# Patient Record
Sex: Female | Born: 1995 | Race: White | Hispanic: No | Smoking: Never smoker
Health system: Southern US, Community
[De-identification: ages and names within clinical notes are randomized; demographics above are authoritative.]

## PROBLEM LIST (undated history)

## (undated) ENCOUNTER — Inpatient Hospital Stay: Payer: Medicaid (Managed Care)

## (undated) ENCOUNTER — Encounter

## (undated) ENCOUNTER — Encounter: Attending: Maternal & Fetal Medicine | Primary: Maternal & Fetal Medicine

## (undated) ENCOUNTER — Other Ambulatory Visit

## (undated) ENCOUNTER — Telehealth

## (undated) ENCOUNTER — Encounter: Attending: Family | Primary: Family

## (undated) ENCOUNTER — Ambulatory Visit: Payer: MEDICAID

## (undated) ENCOUNTER — Encounter: Attending: Medical | Primary: Medical

## (undated) ENCOUNTER — Ambulatory Visit: Payer: Medicaid (Managed Care)

## (undated) ENCOUNTER — Telehealth: Attending: Clinical | Primary: Clinical

## (undated) ENCOUNTER — Ambulatory Visit
Payer: MEDICARE | Attending: Student in an Organized Health Care Education/Training Program | Primary: Student in an Organized Health Care Education/Training Program

## (undated) ENCOUNTER — Encounter: Attending: Advanced Practice Midwife | Primary: Advanced Practice Midwife

## (undated) ENCOUNTER — Ambulatory Visit: Payer: MEDICAID | Attending: Physician Assistant | Primary: Physician Assistant

## (undated) ENCOUNTER — Ambulatory Visit: Payer: MEDICAID | Attending: Advanced Practice Midwife | Primary: Advanced Practice Midwife

## (undated) ENCOUNTER — Ambulatory Visit

## (undated) ENCOUNTER — Telehealth
Attending: Student in an Organized Health Care Education/Training Program | Primary: Student in an Organized Health Care Education/Training Program

## (undated) ENCOUNTER — Ambulatory Visit
Payer: MEDICAID | Attending: Rehabilitative and Restorative Service Providers" | Primary: Rehabilitative and Restorative Service Providers"

## (undated) ENCOUNTER — Encounter: Attending: Physician Assistant | Primary: Physician Assistant

## (undated) ENCOUNTER — Encounter
Attending: Student in an Organized Health Care Education/Training Program | Primary: Student in an Organized Health Care Education/Training Program

## (undated) ENCOUNTER — Ambulatory Visit: Payer: Medicaid (Managed Care) | Attending: Family | Primary: Family

## (undated) ENCOUNTER — Telehealth: Attending: Medical | Primary: Medical

## (undated) ENCOUNTER — Ambulatory Visit: Payer: Medicaid (Managed Care) | Attending: Maternal & Fetal Medicine | Primary: Maternal & Fetal Medicine

## (undated) ENCOUNTER — Telehealth: Attending: Advanced Practice Midwife | Primary: Advanced Practice Midwife

## (undated) ENCOUNTER — Inpatient Hospital Stay: Payer: MEDICARE

## (undated) ENCOUNTER — Telehealth: Attending: Pharmacist | Primary: Pharmacist

## (undated) ENCOUNTER — Ambulatory Visit: Payer: MEDICARE

## (undated) ENCOUNTER — Encounter: Attending: Obstetrics & Gynecology | Primary: Obstetrics & Gynecology

## (undated) ENCOUNTER — Encounter
Attending: Rehabilitative and Restorative Service Providers" | Primary: Rehabilitative and Restorative Service Providers"

## (undated) ENCOUNTER — Inpatient Hospital Stay: Payer: MEDICAID

## (undated) ENCOUNTER — Ambulatory Visit: Payer: Medicaid (Managed Care) | Attending: Physician Assistant | Primary: Physician Assistant

## (undated) ENCOUNTER — Ambulatory Visit
Payer: MEDICAID | Attending: Student in an Organized Health Care Education/Training Program | Primary: Student in an Organized Health Care Education/Training Program

## (undated) ENCOUNTER — Telehealth: Attending: Maternal & Fetal Medicine | Primary: Maternal & Fetal Medicine

## (undated) ENCOUNTER — Ambulatory Visit: Payer: PRIVATE HEALTH INSURANCE | Attending: Registered Nurse | Primary: Registered Nurse

## (undated) ENCOUNTER — Telehealth: Attending: Family | Primary: Family

## (undated) ENCOUNTER — Ambulatory Visit: Payer: MEDICAID | Attending: Registered Nurse | Primary: Registered Nurse

## (undated) ENCOUNTER — Telehealth: Attending: Counselor | Primary: Counselor

## (undated) ENCOUNTER — Telehealth: Attending: Obstetrics & Gynecology | Primary: Obstetrics & Gynecology

## (undated) ENCOUNTER — Encounter: Attending: Registered Nurse | Primary: Registered Nurse

## (undated) ENCOUNTER — Ambulatory Visit: Payer: MEDICAID | Attending: Registered" | Primary: Registered"

## (undated) ENCOUNTER — Encounter: Attending: Registered" | Primary: Registered"

## (undated) ENCOUNTER — Ambulatory Visit: Attending: Dermatology | Primary: Dermatology

## (undated) ENCOUNTER — Ambulatory Visit: Payer: MEDICAID | Attending: Clinical | Primary: Clinical

## (undated) ENCOUNTER — Ambulatory Visit: Payer: PRIVATE HEALTH INSURANCE

## (undated) ENCOUNTER — Ambulatory Visit: Payer: Medicaid (Managed Care) | Attending: Registered Nurse | Primary: Registered Nurse

## (undated) ENCOUNTER — Ambulatory Visit: Payer: MEDICAID | Attending: Obstetrics & Gynecology | Primary: Obstetrics & Gynecology

## (undated) VITALS — BP 134/84 | HR 86 | Temp 98.5°F | Resp 20 | Ht 63.86 in | Wt 336.2 lb

## (undated) DIAGNOSIS — F419 Anxiety disorder, unspecified: Secondary | ICD-10-CM

## (undated) DIAGNOSIS — K219 Gastro-esophageal reflux disease without esophagitis: Secondary | ICD-10-CM

## (undated) DIAGNOSIS — E669 Obesity, unspecified: Secondary | ICD-10-CM

## (undated) DIAGNOSIS — J309 Allergic rhinitis, unspecified: Secondary | ICD-10-CM

## (undated) DIAGNOSIS — F329 Major depressive disorder, single episode, unspecified: Secondary | ICD-10-CM

## (undated) DIAGNOSIS — K589 Irritable bowel syndrome without diarrhea: Secondary | ICD-10-CM

## (undated) DIAGNOSIS — I10 Essential (primary) hypertension: Secondary | ICD-10-CM

## (undated) DIAGNOSIS — F32A Depression, unspecified: Secondary | ICD-10-CM

## (undated) DIAGNOSIS — Z9989 Dependence on other enabling machines and devices: Secondary | ICD-10-CM

## (undated) DIAGNOSIS — G4733 Obstructive sleep apnea (adult) (pediatric): Secondary | ICD-10-CM

## (undated) DIAGNOSIS — L409 Psoriasis, unspecified: Secondary | ICD-10-CM

## (undated) HISTORY — DX: Major depressive disorder, single episode, unspecified: F32.9

## (undated) HISTORY — DX: Depression, unspecified: F32.A

## (undated) HISTORY — PX: SINOSCOPY: SHX187

## (undated) HISTORY — DX: Anxiety disorder, unspecified: F41.9

## (undated) HISTORY — DX: Irritable bowel syndrome, unspecified: K58.9

## (undated) HISTORY — PX: TONSILLECTOMY AND ADENOIDECTOMY: SUR1326

## (undated) HISTORY — DX: Gastro-esophageal reflux disease without esophagitis: K21.9

## (undated) HISTORY — PX: ADENOIDECTOMY: SUR15

## (undated) HISTORY — DX: Allergic rhinitis, unspecified: J30.9

## (undated) HISTORY — DX: Psoriasis, unspecified: L40.9

## (undated) MED ORDER — FERROUS SULFATE 325 MG (65 MG IRON) TABLET: Freq: Every day | ORAL | 0 days | Status: SS

## (undated) MED ORDER — PROMETHAZINE 12.5 MG TABLET: Freq: Four times a day (QID) | ORAL | 0.00000 days | PRN

## (undated) MED ORDER — ASPIRIN 81 MG CAPSULE: Freq: Every day | ORAL | 0.00000 days

## (undated) MED ORDER — LEVOTHYROXINE 50 MCG TABLET: Freq: Every day | ORAL | 0 days

## (undated) MED ORDER — FLUOCINOLONE 0.01 % SCALP OIL AND SHOWER CAP: Freq: Every day | TOPICAL | 0.00000 days

## (undated) MED ORDER — ACETAMINOPHEN 325 MG TABLET: ORAL | 0.00000 days

## (undated) MED ORDER — PULMICORT FLEXHALER INHL: RESPIRATORY_TRACT | 0.00000 days

## (undated) MED ORDER — ALBUTEROL SULFATE HFA 90 MCG/ACTUATION AEROSOL INHALER: Freq: Four times a day (QID) | RESPIRATORY_TRACT | 0.00000 days | PRN

## (undated) MED ORDER — PRENATAL VITAMIN WITH CALCIUM NO.72-IRON 27 MG-FOLIC ACID 1 MG TABLET: Freq: Every day | ORAL | 0.00000 days

## (undated) MED ORDER — DOCUSATE SODIUM 100 MG CAPSULE: Freq: Every day | ORAL | 0 days

## (undated) MED ORDER — HYDRALAZINE 10 MG TABLET: Freq: Four times a day (QID) | ORAL | 0 days

## (undated) MED ORDER — FLUTICASONE PROPIONATE 50 MCG/ACTUATION NASAL SPRAY,SUSPENSION: Freq: Every day | NASAL | 0 days | Status: SS

## (undated) MED ORDER — GUAIFENESIN 400 MG TABLET
ORAL | 0 days | PRN
Start: ? — End: 2020-05-26

## (undated) MED ORDER — ONDANSETRON HCL 4 MG TABLET: Freq: Three times a day (TID) | ORAL | 0.00000 days | PRN

## (undated) MED ORDER — CIMZIA SUBQ: SUBCUTANEOUS | 0 days | Status: SS

## (undated) MED ORDER — ALBUTEROL SULFATE 2.5 MG/3 ML (0.083 %) SOLUTION FOR NEBULIZATION: Freq: Four times a day (QID) | RESPIRATORY_TRACT | 0.00000 days | Status: SS | PRN

## (undated) MED ORDER — SENNOSIDES 8.6 MG TABLET: Freq: Every day | ORAL | 0 days

---
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
## 2015-01-09 ENCOUNTER — Ambulatory Visit: Payer: Medicaid Other | Admitting: Allergy and Immunology

## 2015-01-24 ENCOUNTER — Other Ambulatory Visit: Payer: Self-pay | Admitting: *Deleted

## 2015-01-24 MED ORDER — FEXOFENADINE HCL 180 MG PO TABS: 180.0000 mg | ORAL_TABLET | Freq: Every day | ORAL | Status: DC

## 2015-02-19 ENCOUNTER — Ambulatory Visit: Payer: Medicaid Other | Admitting: Allergy and Immunology

## 2015-03-03 ENCOUNTER — Ambulatory Visit: Payer: Medicaid Other | Admitting: Allergy and Immunology

## 2015-03-14 DIAGNOSIS — F603 Borderline personality disorder: Secondary | ICD-10-CM | POA: Insufficient documentation

## 2015-03-14 HISTORY — DX: Borderline personality disorder: F60.3

## 2015-04-03 HISTORY — DX: Morbid (severe) obesity due to excess calories: E66.01

## 2015-04-14 ENCOUNTER — Ambulatory Visit: Payer: Medicaid Other | Admitting: Allergy and Immunology

## 2015-04-21 ENCOUNTER — Ambulatory Visit: Payer: Medicaid Other | Admitting: Allergy and Immunology

## 2015-04-28 ENCOUNTER — Ambulatory Visit: Payer: Medicaid Other | Admitting: Allergy and Immunology

## 2015-05-01 ENCOUNTER — Other Ambulatory Visit: Payer: Self-pay | Admitting: *Deleted

## 2015-05-01 MED ORDER — FEXOFENADINE HCL 180 MG PO TABS: 180.0000 mg | ORAL_TABLET | Freq: Every day | ORAL | Status: DC

## 2015-05-08 ENCOUNTER — Ambulatory Visit: Payer: Medicaid Other | Admitting: Allergy and Immunology

## 2015-05-19 ENCOUNTER — Ambulatory Visit (INDEPENDENT_AMBULATORY_CARE_PROVIDER_SITE_OTHER): Payer: Medicaid Other | Admitting: Allergy and Immunology

## 2015-05-19 ENCOUNTER — Encounter: Payer: Self-pay | Admitting: Allergy and Immunology

## 2015-05-19 VITALS — BP 124/80 | HR 80 | Resp 16

## 2015-05-19 DIAGNOSIS — K219 Gastro-esophageal reflux disease without esophagitis: Secondary | ICD-10-CM | POA: Diagnosis not present

## 2015-05-19 DIAGNOSIS — J4520 Mild intermittent asthma, uncomplicated: Secondary | ICD-10-CM | POA: Diagnosis not present

## 2015-05-19 DIAGNOSIS — L989 Disorder of the skin and subcutaneous tissue, unspecified: Secondary | ICD-10-CM

## 2015-05-19 DIAGNOSIS — H1010 Acute atopic conjunctivitis, unspecified eye: Secondary | ICD-10-CM

## 2015-05-19 DIAGNOSIS — J309 Allergic rhinitis, unspecified: Secondary | ICD-10-CM

## 2015-05-19 DIAGNOSIS — L308 Other specified dermatitis: Secondary | ICD-10-CM | POA: Diagnosis not present

## 2015-05-19 MED ORDER — AZELASTINE HCL 0.1 % NA SOLN: NASAL | Status: DC

## 2015-05-19 NOTE — Patient Instructions (Addendum)
  1. Visit with dermatologist concerning dermatitis on trunk  2. Continue Prilosec 40 mg twice a day  3. Continue Nasonex one spray each nostril twice a day  4. Continue Allegra 180 one tablet one time per day  5. Restart immunotherapy and EpiPen  6. If needed:   A. nasal saline spray multiple times a day  B. Azelastine 1-2 sprays each nostril one-2 times a day  C. Proventil HFA 2 puffs every 4-6 hours  7. Return to clinic in 3 months or earlier if there is a problem

## 2015-05-19 NOTE — Progress Notes (Signed)
Follow-up Note  Referring Provider: Cresenciano Lick, MD Primary Provider: Cresenciano Lick, MD Date of Office Visit: 05/19/2015  Subjective:   Misty [name] (DOB: 1995/05/09) is a 20 y.o. female [facility] in re-evaluation of the following:  HPI Comments: Misty [name] returns to this clinic in reevaluation of her allergic rhinitis and reflux and distant history of asthma and intermittent urticaria. She is developed problems with stuffed up in the morning and some nasal congestion even in the face of using her Nasonex twice a day. She's been without immunotherapy now for greater than 6 months because of logistical issue. She would like to restart this form of therapy.  Her reflux is under good control as long as she consistently use her medical treatment which at this point time includes Prilosec 40 mg twice a day.  She's not had any significant issues with asthma requiring her to use a short acting bronchodilator. She's not been having any problems with hives.  She has been developing a dermatitis on her trunk. 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 She did visit with her primary care physician this morning who gave her pills.     Medication List           busPIRone 10 MG tablet  Commonly known as:  BUSPAR  Take 20 mg by mouth 3 (three) times daily.     CRYSELLE-28 PO  Take by mouth.     dicyclomine 10 MG capsule  Commonly known as:  BENTYL  Take 10 mg by mouth 4 (four) times daily.     fexofenadine 180 MG tablet  Commonly known as:  ALLEGRA  Take 180 mg by mouth daily.     FIBER PO  Take by mouth daily.     FLUoxetine 20 MG capsule  Commonly known as:  PROZAC  Take 20 mg by mouth every morning.     hydrOXYzine  25 MG tablet  Commonly known as:  ATARAX/VISTARIL  Take 25 mg by mouth 4 (four) times daily.     lisinopril 10 MG tablet  Commonly known as:  PRINIVIL,ZESTRIL  Take 10 mg by mouth every morning.     MELATONIN PO  Take 6 mg by mouth at bedtime.     metoprolol tartrate 25 MG tablet  Commonly known as:  LOPRESSOR  Take 25 mg by mouth 2 (two) times daily.     MULTIVITAMIN ADULT PO  Take by mouth.     NASONEX 50 MCG/ACT nasal spray  Generic drug:  mometasone  Place 1 spray into the nose 2 (two) times daily.     omeprazole 40 MG capsule  Commonly known as:  PRILOSEC  Take 40 mg by mouth 2 (two) times daily.     Oxcarbazepine 300 MG tablet  Commonly known as:  TRILEPTAL  Take 300 mg by mouth 2 (two) times daily.     paliperidone 6 MG 24 hr tablet  Commonly known as:  INVEGA  Take 6 mg by mouth every morning.     paliperidone 9 MG 24 hr tablet  Commonly known as:  INVEGA  Take 9 mg by mouth at bedtime.     PAZEO 0.7 % Soln  Generic drug:  Olopatadine HCl  Apply 1 drop to eye daily  as needed.     prazosin 1 MG capsule  Commonly known as:  MINIPRESS  Take 2 mg by mouth at bedtime.     WATER PILLS PO  Take by mouth 3 (three) times daily.        Past Medical History  Diagnosis Date  . Obesity   . OSA on CPAP   . HTN (hypertension) 07/13/12  . Depression   . Anxiety   . IBS (irritable bowel syndrome)   . Psoriasis   . Allergic rhinitis   . GERD (gastroesophageal reflux disease)     Past Surgical History  Procedure Laterality Date  . Tonsillectomy and adenoidectomy  Age 51    Deviated septum corrected at same time  . Adenoidectomy    . Sinoscopy      Allergies  Allergen Reactions  . Ultram [Tramadol] Rash    Pt reports itching as well  . Adhesive [Tape] Rash    Review of systems negative except as noted in HPI / PMHx or noted below:  Review of Systems  Constitutional: Negative.   HENT: Negative.   Eyes: Negative.   Respiratory: Negative.     Cardiovascular: Negative.   Gastrointestinal: Negative.   Genitourinary: Negative.   Musculoskeletal: Negative.   Skin: Negative.   Neurological: Negative.   Endo/Heme/Allergies: Negative.   Psychiatric/Behavioral: Negative.      Objective:   Filed Vitals:   05/19/15 1646  BP: 124/80  Pulse: 80  Resp: 16          Physical Exam  Constitutional: She is well-developed, well-nourished, and in no distress.  HENT:  Head: Normocephalic.  Right Ear: Tympanic membrane, external ear and ear canal normal.  Left Ear: Tympanic membrane, external ear and ear canal normal.  Nose: Nose normal. No mucosal edema or rhinorrhea.  Mouth/Throat: Uvula is midline, oropharynx is clear and moist and mucous membranes are normal. No oropharyngeal exudate.  Eyes: Conjunctivae are normal.  Neck: Trachea normal. No tracheal tenderness present. No tracheal deviation present. No thyromegaly present.  Cardiovascular: Normal rate, regular rhythm, S1 normal, S2 normal and normal heart sounds.   No murmur heard. Pulmonary/Chest: Breath sounds normal. No stridor. No respiratory distress. She has no wheezes. She has no rales.  Musculoskeletal: She exhibits no edema.  Lymphadenopathy:       Head (right side): No tonsillar adenopathy present.       Head (left side): No tonsillar adenopathy present.    She has no cervical adenopathy.  Neurological: She is alert. Gait normal.  Skin: Rash (Scaly erythematous indurated dermatitis involving postauricular and scalp area. Very erythematous slightly indurated dermatitis involving infra-breast area and spotty location on abdomen without any scale.) noted. She is not diaphoretic. No erythema. Nails show no clubbing.  Psychiatric: Mood and affect normal.    Diagnostics: None    Assessment and Plan:   1. Allergic rhinoconjunctivitis   2. Asthma, mild intermittent, well-controlled   3. Gastroesophageal reflux disease, esophagitis presence not specified   4.  Inflammatory dermatosis     1. Visit with dermatologist concerning dermatitis on trunk  2. Continue Prilosec 20 mg twice a day  3. Continue Nasonex one spray each nostril twice a day  4. Continue Allegra 180 one tablet one time per day  5. Restart immunotherapy and EpiPen  6. If needed:   A. nasal saline spray multiple times a day  B. Azelastine 1-2 sprays each nostril one-2 times a day  C. Proventil HFA 2 puffs every 4-6 hours  7. Return to clinic in 3 months or earlier if there is a problem  Misty [name] would do better if she consistently used her immunotherapy and she will try to restart this form of therapy in the near future. She will continue to use some anti-inflammatory medications for her upper respiratory tract as stated above and has the option of using various other medications to control symptoms. Her dermatitis is somewhat worrisome. I suspect it is fungal in nature but this is a rather significant fungal infection it would  be best handled by her dermatologist. She already has a close relationship with her dermatologist to address her psoriatic dermatitis which sounds as though it will be treated with Enbrel in the near future. She had some blood tests drawn recently in anticipation of starting a treatment. I will see her back in this clinic in 3 months or earlier if there is a problem.  Laurette SchimkeEric Dezerae Freiberger, MD [facility]

## 2015-05-21 ENCOUNTER — Telehealth: Payer: Self-pay | Admitting: Allergy and Immunology

## 2015-05-21 ENCOUNTER — Other Ambulatory Visit: Payer: Self-pay

## 2015-05-21 MED ORDER — ALBUTEROL SULFATE HFA 108 (90 BASE) MCG/ACT IN AERS: 2.0000 | INHALATION_SPRAY | RESPIRATORY_TRACT | Status: DC | PRN

## 2015-05-21 NOTE — Telephone Encounter (Signed)
proventil was not called into pharmacy

## 2015-05-22 DIAGNOSIS — J3089 Other allergic rhinitis: Secondary | ICD-10-CM | POA: Diagnosis not present

## 2015-05-29 ENCOUNTER — Other Ambulatory Visit: Payer: Self-pay | Admitting: Allergy and Immunology

## 2015-06-12 ENCOUNTER — Ambulatory Visit: Payer: Medicaid Other

## 2015-06-16 ENCOUNTER — Ambulatory Visit: Payer: Medicaid Other

## 2015-06-30 ENCOUNTER — Ambulatory Visit: Payer: Medicaid Other

## 2015-06-30 ENCOUNTER — Ambulatory Visit (INDEPENDENT_AMBULATORY_CARE_PROVIDER_SITE_OTHER): Payer: Medicaid Other

## 2015-06-30 DIAGNOSIS — J309 Allergic rhinitis, unspecified: Secondary | ICD-10-CM | POA: Diagnosis not present

## 2015-07-10 ENCOUNTER — Ambulatory Visit (INDEPENDENT_AMBULATORY_CARE_PROVIDER_SITE_OTHER): Payer: Medicaid Other

## 2015-07-10 DIAGNOSIS — J309 Allergic rhinitis, unspecified: Secondary | ICD-10-CM | POA: Diagnosis not present

## 2015-07-21 DIAGNOSIS — R002 Palpitations: Secondary | ICD-10-CM

## 2015-07-21 HISTORY — DX: Palpitations: R00.2

## 2015-08-07 ENCOUNTER — Ambulatory Visit: Payer: Medicaid Other | Admitting: Allergy and Immunology

## 2015-08-18 ENCOUNTER — Encounter: Payer: Self-pay | Admitting: Allergy and Immunology

## 2015-08-18 ENCOUNTER — Ambulatory Visit: Payer: Self-pay

## 2015-08-18 ENCOUNTER — Ambulatory Visit (INDEPENDENT_AMBULATORY_CARE_PROVIDER_SITE_OTHER): Payer: Medicaid Other | Admitting: Allergy and Immunology

## 2015-08-18 VITALS — BP 130/68 | HR 80 | Resp 22 | Ht 63.7 in | Wt >= 6400 oz

## 2015-08-18 DIAGNOSIS — J309 Allergic rhinitis, unspecified: Secondary | ICD-10-CM | POA: Diagnosis not present

## 2015-08-18 DIAGNOSIS — H1010 Acute atopic conjunctivitis, unspecified eye: Secondary | ICD-10-CM

## 2015-08-18 DIAGNOSIS — J4531 Mild persistent asthma with (acute) exacerbation: Secondary | ICD-10-CM | POA: Diagnosis not present

## 2015-08-18 DIAGNOSIS — K219 Gastro-esophageal reflux disease without esophagitis: Secondary | ICD-10-CM

## 2015-08-18 MED ORDER — BECLOMETHASONE DIPROPIONATE 80 MCG/ACT IN AERS: INHALATION_SPRAY | RESPIRATORY_TRACT | Status: DC

## 2015-08-18 NOTE — Progress Notes (Signed)
Follow-up Note  Referring Provider: Cresenciano Lick, MD Primary Provider: Cresenciano Lick, MD Date of Office Visit: 08/18/2015  Subjective:   Misty [name] (DOB: 1995-06-23) is a 20 y.o. female who [facility] in re-evaluation of the following:  HPI: Misty [name] returns to this clinic in reevaluation of her asthma and allergic rhinitis treated with immunotherapy and history of intermittent urticaria. It has been approximately 3 months since I've seen her in his clinic.  During the interval she has done relatively well regarding her asthma although she has noted a little bit increased activity of her asthma requiring her to use a bronchodilator every day over the course of the past several months. In addition, she states that she has continued to have problems with sinus infections especially when she was using Enbrel to treat her psoriatic arthritis. Because of these infections she is no longer on Enbrel and she's going to be starting stelara sometime in the next week. She was given Singulair at one point in time but she does not think that this works as well as Careers adviser regarding all of her upper airway symptoms. It certainly has not helped her asthma.  Her reflux has under very good control at this point in time.  She is not been having any difficulty with her immunotherapy.    Medication List           albuterol 108 (90 Base) MCG/ACT inhaler  Commonly known as:  PROVENTIL HFA  Inhale 2 puffs into the lungs every 4 (four) hours as needed for wheezing or shortness of breath.     azelastine 0.1 % nasal spray  Commonly known as:  ASTELIN  USE 1-2 SPRAYS IN EACH NOSTRIL ONCE OR TWICE DAILY IF NEEDED     busPIRone 10 MG tablet  Commonly known as:  BUSPAR  Take 20 mg by mouth 3 (three) times daily.     CRYSELLE-28 PO  Take by mouth.     dicyclomine 10 MG capsule  Commonly known as:  BENTYL  Take 10 mg by mouth 4 (four) times  daily.     FIBER PO  Take by mouth daily.     hydrOXYzine 25 MG tablet  Commonly known as:  ATARAX/VISTARIL  Take 25 mg by mouth 4 (four) times daily.     lisinopril 10 MG tablet  Commonly known as:  PRINIVIL,ZESTRIL  Take 10 mg by mouth every morning.     lithium carbonate 300 MG CR tablet  Commonly known as:  LITHOBID  Take 600 mg by mouth 2 (two) times daily.     MELATONIN PO  Take 6 mg by mouth at bedtime.     metoprolol tartrate 25 MG tablet  Commonly known as:  LOPRESSOR  Take 25 mg by mouth 2 (two) times daily.     MULTIVITAMIN ADULT PO  Take by mouth.     NASONEX 50 MCG/ACT nasal spray  Generic drug:  mometasone  Place 1 spray into the nose 2 (two) times daily.     omeprazole 40 MG capsule  Commonly known as:  PRILOSEC  Take 40 mg by mouth 2 (two) times daily.     PAZEO 0.7 % Soln  Generic drug:  Olopatadine HCl  Apply 1 drop to eye daily as needed.     prazosin 1 MG capsule  Commonly known as:  MINIPRESS  Take 2 mg by mouth at bedtime.     risperiDONE 2 MG tablet  Commonly known as:  RISPERDAL  Take 2 mg by mouth at bedtime.     WATER PILLS PO  Take by mouth 3 (three) times daily.        Past Medical History  Diagnosis Date  . Obesity   . OSA on CPAP   . HTN (hypertension) 07/13/12  . Depression   . Anxiety   . IBS (irritable bowel syndrome)   . Psoriasis   . Allergic rhinitis   . GERD (gastroesophageal reflux disease)     Past Surgical History  Procedure Laterality Date  . Tonsillectomy and adenoidectomy  Age 43    Deviated septum corrected at same time  . Adenoidectomy    . Sinoscopy      Allergies  Allergen Reactions  . Ultram [Tramadol] Rash    Pt reports itching as well  . Adhesive [Tape] Rash    Review of systems negative except as noted in HPI / PMHx or noted below:  Review of Systems  Constitutional: Negative.   HENT: Negative.   Eyes: Negative.   Respiratory: Negative.   Cardiovascular: Negative.     Gastrointestinal: Negative.   Genitourinary: Negative.   Musculoskeletal: Negative.   Skin: Negative.   Neurological: Negative.   Endo/Heme/Allergies: Negative.   Psychiatric/Behavioral: Negative.      Objective:   Filed Vitals:   08/18/15 1603  BP: 130/68  Pulse: 80  Resp: 22   Height: 5' 3.7" (161.8 cm)  Weight: (!) 418 lb 14 oz (190 kg)   Physical Exam  Constitutional: She is well-developed, well-nourished, and in no distress.  HENT:  Head: Normocephalic.  Right Ear: Tympanic membrane, external ear and ear canal normal.  Left Ear: Tympanic membrane, external ear and ear canal normal.  Nose: Nose normal. No mucosal edema or rhinorrhea.  Mouth/Throat: Uvula is midline, oropharynx is clear and moist and mucous membranes are normal. No oropharyngeal exudate.  Eyes: Conjunctivae are normal.  Neck: Trachea normal. No tracheal tenderness present. No tracheal deviation present. No thyromegaly present.  Cardiovascular: Normal rate, regular rhythm, S1 normal, S2 normal and normal heart sounds.   No murmur heard. Pulmonary/Chest: Breath sounds normal. No stridor. No respiratory distress. She has no wheezes. She has no rales.  Musculoskeletal: She exhibits no edema.  Lymphadenopathy:       Head (right side): No tonsillar adenopathy present.       Head (left side): No tonsillar adenopathy present.    She has no cervical adenopathy.  Neurological: She is alert. Gait normal.  Skin: No rash noted. She is not diaphoretic. No erythema. Nails show no clubbing.  Psychiatric: Mood and affect normal.    Diagnostics:    Spirometry was performed and demonstrated an FEV1 of 2.24 at 67 % of predicted.  The patient had an Asthma Control Test with the following results:  .    Assessment and Plan:   1. Asthma, not well controlled, mild persistent, with acute exacerbation   2. Allergic rhinoconjunctivitis   3. Gastroesophageal reflux disease, esophagitis presence not specified     1.  Start Qvar 80-2 inhalations one time per day with spacer. Increase to 3 inhalations 3 times per day during "asthma flare"  2. Continue Prilosec 40 mg twice a day  3. Continue Nasonex one spray each nostril twice a day  4. Continue Allegra 180 one tablet one time per day. Discontinue Singulair  5. Continue immunotherapy and EpiPen  6. If needed:   A. nasal saline spray multiple times a day  B. Azelastine 1-2 sprays each nostril one-2 times a day  C. Proventil HFA 2 puffs every 4-6 hours  7. Return to clinic in 3 months or earlier if there is a problem  8. Obtain fall flu vaccine  Crystal will use a combination of anti-inflammatory medications for her respiratory tract and given the fact that she has a requirement for bronchodilator that has become daily we'll start her on an inhaled steroid and assess her response over the course of the next several weeks. She'll keep in contact with me noting her response. If she does well I'll see her back in this clinic in 12 weeks or earlier if there is a problem.  Laurette SchimkeEric Atwell Mcdanel, MD [facility]

## 2015-08-18 NOTE — Patient Instructions (Addendum)
  1. Start Qvar 80-2 inhalations one time per day with spacer. Increase to 3 inhalations 3 times per day during "asthma flare"  2. Continue Prilosec 40 mg twice a day  3. Continue Nasonex one spray each nostril twice a day  4. Continue Allegra 180 one tablet one time per day. Discontinue Singulair  5. Continue immunotherapy and EpiPen  6. If needed:   A. nasal saline spray multiple times a day  B. Azelastine 1-2 sprays each nostril one-2 times a day  C. Proventil HFA 2 puffs every 4-6 hours  7. Return to clinic in 3 months or earlier if there is a problem  8. Obtain fall flu vaccine

## 2015-08-27 ENCOUNTER — Encounter: Payer: Self-pay | Admitting: Allergy and Immunology

## 2015-08-28 ENCOUNTER — Ambulatory Visit (INDEPENDENT_AMBULATORY_CARE_PROVIDER_SITE_OTHER): Payer: Medicaid Other | Admitting: *Deleted

## 2015-08-28 DIAGNOSIS — J309 Allergic rhinitis, unspecified: Secondary | ICD-10-CM

## 2015-09-12 DIAGNOSIS — F315 Bipolar disorder, current episode depressed, severe, with psychotic features: Secondary | ICD-10-CM | POA: Insufficient documentation

## 2015-09-15 ENCOUNTER — Ambulatory Visit (INDEPENDENT_AMBULATORY_CARE_PROVIDER_SITE_OTHER): Payer: Medicaid Other | Admitting: *Deleted

## 2015-09-15 DIAGNOSIS — J309 Allergic rhinitis, unspecified: Secondary | ICD-10-CM | POA: Diagnosis not present

## 2015-09-25 ENCOUNTER — Ambulatory Visit (INDEPENDENT_AMBULATORY_CARE_PROVIDER_SITE_OTHER): Payer: Medicaid Other | Admitting: *Deleted

## 2015-09-25 DIAGNOSIS — J309 Allergic rhinitis, unspecified: Secondary | ICD-10-CM

## 2015-10-06 ENCOUNTER — Ambulatory Visit (INDEPENDENT_AMBULATORY_CARE_PROVIDER_SITE_OTHER): Payer: Medicaid Other | Admitting: *Deleted

## 2015-10-06 DIAGNOSIS — J309 Allergic rhinitis, unspecified: Secondary | ICD-10-CM | POA: Diagnosis not present

## 2015-10-10 DIAGNOSIS — K760 Fatty (change of) liver, not elsewhere classified: Secondary | ICD-10-CM

## 2015-10-10 DIAGNOSIS — F319 Bipolar disorder, unspecified: Secondary | ICD-10-CM

## 2015-10-10 DIAGNOSIS — E611 Iron deficiency: Secondary | ICD-10-CM | POA: Insufficient documentation

## 2015-10-10 HISTORY — DX: Fatty (change of) liver, not elsewhere classified: K76.0

## 2015-10-10 HISTORY — DX: Iron deficiency: E61.1

## 2015-10-10 HISTORY — DX: Bipolar disorder, unspecified: F31.9

## 2015-10-20 ENCOUNTER — Ambulatory Visit (INDEPENDENT_AMBULATORY_CARE_PROVIDER_SITE_OTHER): Payer: Medicaid Other | Admitting: *Deleted

## 2015-10-20 DIAGNOSIS — J309 Allergic rhinitis, unspecified: Secondary | ICD-10-CM

## 2015-10-27 ENCOUNTER — Ambulatory Visit (INDEPENDENT_AMBULATORY_CARE_PROVIDER_SITE_OTHER): Payer: Medicaid Other | Admitting: *Deleted

## 2015-10-27 DIAGNOSIS — J309 Allergic rhinitis, unspecified: Secondary | ICD-10-CM | POA: Diagnosis not present

## 2015-10-30 ENCOUNTER — Other Ambulatory Visit: Payer: Self-pay | Admitting: Allergy and Immunology

## 2015-11-17 ENCOUNTER — Ambulatory Visit (INDEPENDENT_AMBULATORY_CARE_PROVIDER_SITE_OTHER): Payer: Medicaid Other | Admitting: *Deleted

## 2015-11-17 DIAGNOSIS — J309 Allergic rhinitis, unspecified: Secondary | ICD-10-CM | POA: Diagnosis not present

## 2015-11-19 ENCOUNTER — Ambulatory Visit (INDEPENDENT_AMBULATORY_CARE_PROVIDER_SITE_OTHER): Payer: Medicaid Other | Admitting: Allergy and Immunology

## 2015-11-19 ENCOUNTER — Encounter: Payer: Self-pay | Admitting: Allergy and Immunology

## 2015-11-19 VITALS — BP 132/90 | HR 72 | Resp 16

## 2015-11-19 DIAGNOSIS — J309 Allergic rhinitis, unspecified: Secondary | ICD-10-CM

## 2015-11-19 DIAGNOSIS — J4541 Moderate persistent asthma with (acute) exacerbation: Secondary | ICD-10-CM | POA: Diagnosis not present

## 2015-11-19 DIAGNOSIS — K219 Gastro-esophageal reflux disease without esophagitis: Secondary | ICD-10-CM | POA: Diagnosis not present

## 2015-11-19 DIAGNOSIS — H1010 Acute atopic conjunctivitis, unspecified eye: Secondary | ICD-10-CM | POA: Diagnosis not present

## 2015-11-19 MED ORDER — BUDESONIDE-FORMOTEROL FUMARATE 160-4.5 MCG/ACT IN AERO: INHALATION_SPRAY | RESPIRATORY_TRACT | 5 refills | Status: DC

## 2015-11-19 NOTE — Progress Notes (Signed)
Follow-up Note  Referring Provider: Cresenciano LickBennett-Cain, Andrea, MD Primary Provider: Heron NayYOUNG, [name], PA Date of Office Visit: 11/19/2015  Subjective:   Misty [name] (DOB: 1995-07-23) is a 20 y.o. female who [facility] in re-evaluation of the following:  HPI: Misty NissenKristal presents to this clinic in reevaluation of her asthma and allergic rhinitis treated with immunotherapy and history of intermittent urticaria. I last saw her in his clinic in July 2017.  She states that she has had a little bit more shortness of breath and wheezing even in the face of using her Qvar. She was somewhat intolerant of using a short acting bronchodilator because of the development of GI upset.  She probably had a sleep study with our local pulmonologist and apparently this was negative but it does sound as though her oxygen went down yet she has not been placed on any supplemental oxygen. She has a appointment to see the pulmonologist in a week or 2.  Her nose is actually doing quite good at this point in time. Likewise her reflux is doing quite well. She has not had any urticaria.    Medication List      albuterol 108 (90 Base) MCG/ACT inhaler Commonly known as:  PROVENTIL HFA Inhale 2 puffs into the lungs every 4 (four) hours as needed for wheezing or shortness of breath.   azelastine 0.1 % nasal spray Commonly known as:  ASTELIN USE 1-2 SPRAYS IN EACH NOSTRIL ONCE OR TWICE DAILY IF NEEDED   beclomethasone 80 MCG/ACT inhaler Commonly known as:  QVAR INHALE TWO PUFFS ONCE DAILY TO PREVENT COUGH OR WHEEZE. RINSE MOUTH AFTER USE.   busPIRone 10 MG tablet Commonly known as:  BUSPAR Take 20 mg by mouth 3 (three) times daily.   CRYSELLE-28 PO Take by mouth.   dicyclomine 10 MG capsule Commonly known as:  BENTYL Take 10 mg by mouth 4 (four) times daily.   fexofenadine 180 MG tablet Commonly known as:  ALLEGRA TAKE ONE TABLET BY MOUTH DAILY   FIBER  PO Take by mouth daily.   FLUoxetine 20 MG capsule Commonly known as:  PROZAC Take 20 mg by mouth.   hydrOXYzine 25 MG tablet Commonly known as:  ATARAX/VISTARIL Take 25 mg by mouth 4 (four) times daily.   lisinopril 10 MG tablet Commonly known as:  PRINIVIL,ZESTRIL Take 10 mg by mouth every morning.   lithium carbonate 300 MG CR tablet Commonly known as:  LITHOBID Take 600 mg by mouth 2 (two) times daily.   MELATONIN PO Take 6 mg by mouth at bedtime.   metoprolol 50 MG tablet Commonly known as:  LOPRESSOR Take 50 mg by mouth 2 (two) times daily.   MULTIVITAMIN ADULT PO Take by mouth.   NASONEX 50 MCG/ACT nasal spray Generic drug:  mometasone Place 1 spray into the nose 2 (two) times daily.   omeprazole 40 MG capsule Commonly known as:  PRILOSEC Take 40 mg by mouth 2 (two) times daily.   PAZEO 0.7 % Soln Generic drug:  Olopatadine HCl Apply 1 drop to eye daily as needed.   prazosin 1 MG capsule Commonly known as:  MINIPRESS Take 2 mg by mouth at bedtime.   risperiDONE 2 MG tablet Commonly known as:  RISPERDAL Take 2 mg by mouth at bedtime.   VITAMIN B-2 PO Take by mouth.   WATER PILLS PO Take by mouth 3 (three) times daily.       Past Medical  History:  Diagnosis Date  . Allergic rhinitis   . Anxiety   . Depression   . GERD (gastroesophageal reflux disease)   . HTN (hypertension) 07/13/12  . IBS (irritable bowel syndrome)   . Obesity   . OSA on CPAP   . Psoriasis     Past Surgical History:  Procedure Laterality Date  . ADENOIDECTOMY    . SINOSCOPY    . TONSILLECTOMY AND ADENOIDECTOMY  Age 63   Deviated septum corrected at same time    Allergies  Allergen Reactions  . Ultram [Tramadol] Rash    Pt reports itching as well  . Adhesive [Tape] Rash    Review of systems negative except as noted in HPI / PMHx or noted below:  Review of Systems  Constitutional: Negative.   HENT: Negative.   Eyes: Negative.   Respiratory: Negative.    Cardiovascular: Negative.   Gastrointestinal: Negative.   Genitourinary: Negative.   Musculoskeletal: Negative.   Skin: Negative.   Neurological: Negative.   Endo/Heme/Allergies: Negative.   Psychiatric/Behavioral: Negative.      Objective:   Vitals:   11/19/15 1611  BP: 132/90  Pulse: 72  Resp: 16          Physical Exam  Constitutional: She is well-developed, well-nourished, and in no distress.  HENT:  Head: Normocephalic.  Right Ear: Tympanic membrane, external ear and ear canal normal.  Left Ear: Tympanic membrane, external ear and ear canal normal.  Nose: Nose normal. No mucosal edema or rhinorrhea.  Mouth/Throat: Uvula is midline, oropharynx is clear and moist and mucous membranes are normal. No oropharyngeal exudate.  Eyes: Conjunctivae are normal.  Neck: Trachea normal. No tracheal tenderness present. No tracheal deviation present. No thyromegaly present.  Cardiovascular: Normal rate, regular rhythm, S1 normal, S2 normal and normal heart sounds.   No murmur heard. Pulmonary/Chest: Breath sounds normal. No stridor. No respiratory distress. She has no wheezes. She has no rales.  Musculoskeletal: She exhibits no edema.  Lymphadenopathy:       Head (right side): No tonsillar adenopathy present.       Head (left side): No tonsillar adenopathy present.    She has no cervical adenopathy.  Neurological: She is alert. Gait normal.  Skin: No rash noted. She is not diaphoretic. No erythema. Nails show no clubbing.  Psychiatric: Mood and affect normal.    Diagnostics:    Spirometry was performed and demonstrated an FEV1 of 1.87 at 56 % of predicted.  The patient had an Asthma Control Test with the following results: ACT Total Score: 12.    Assessment and Plan:   1. Asthma, not well controlled, moderate persistent, with acute exacerbation   2. Allergic rhinoconjunctivitis   3. Gastroesophageal reflux disease, esophagitis presence not specified     1. Change Qvar  to Symbicort 160 two inhalations two times per day with spacer.  2. "Action Plan" for asthma flare up:   A. QVAR 80-2 inhalations 3 inhalations 3 times per day   2. Continue Prilosec 40 mg twice a day  3. Continue Nasonex one spray each nostril twice a day  4. Continue Allegra 180 one tablet one time per day. Discontinue Singulair  5. Continue immunotherapy and EpiPen  6. If needed:   A. nasal saline spray multiple times a day  B. Azelastine 1-2 sprays each nostril one-2 times a day  C. Proventil HFA 2 puffs every 4-6 hours  7. Return to clinic in 3 weeks or earlier if there is a  problem  8. Obtain fall flu vaccine  For the next 3 weeks I will have crystal utilize a combination inhaler to see if this gives her better relief regarding her asthmatic symptoms. Certainly her apparent documented hypoxemia at nighttime is probably a reflection of her morbid obesity as a result of hypoventilation that occurs while she sleeps. I'm not going to address this issue but she obviously needs to have follow-up with her pulmonologist regarding further evaluation and treatment of this issue. I will regroup with her in approximately 3 weeks or earlier if there is a problem.  Laurette Schimke, MD [facility]

## 2015-11-19 NOTE — Patient Instructions (Addendum)
  1. Change Qvar to Symbicort 160 two inhalations two times per day with spacer.  2. "Action Plan" for asthma flare up:   A. QVAR 80-2 inhalations 3 inhalations 3 times per day   2. Continue Prilosec 40 mg twice a day  3. Continue Nasonex one spray each nostril twice a day  4. Continue Allegra 180 one tablet one time per day. Discontinue Singulair  5. Continue immunotherapy and EpiPen  6. If needed:   A. nasal saline spray multiple times a day  B. Azelastine 1-2 sprays each nostril one-2 times a day  C. Proventil HFA 2 puffs every 4-6 hours  7. Return to clinic in 3 weeks or earlier if there is a problem  8. Obtain fall flu vaccine

## 2015-11-24 ENCOUNTER — Ambulatory Visit (INDEPENDENT_AMBULATORY_CARE_PROVIDER_SITE_OTHER): Payer: Medicaid Other | Admitting: *Deleted

## 2015-11-24 DIAGNOSIS — J309 Allergic rhinitis, unspecified: Secondary | ICD-10-CM | POA: Diagnosis not present

## 2015-11-26 ENCOUNTER — Other Ambulatory Visit: Payer: Self-pay | Admitting: Allergy and Immunology

## 2015-12-03 ENCOUNTER — Ambulatory Visit: Payer: Medicaid Other | Admitting: Allergy and Immunology

## 2015-12-03 DIAGNOSIS — Z6841 Body Mass Index (BMI) 40.0 and over, adult: Secondary | ICD-10-CM

## 2015-12-03 HISTORY — DX: Body Mass Index (BMI) 40.0 and over, adult: Z684

## 2015-12-11 DIAGNOSIS — D509 Iron deficiency anemia, unspecified: Secondary | ICD-10-CM | POA: Insufficient documentation

## 2015-12-11 HISTORY — DX: Iron deficiency anemia, unspecified: D50.9

## 2015-12-17 ENCOUNTER — Encounter: Payer: Self-pay | Admitting: Allergy and Immunology

## 2015-12-17 ENCOUNTER — Ambulatory Visit (INDEPENDENT_AMBULATORY_CARE_PROVIDER_SITE_OTHER): Payer: Medicaid Other | Admitting: Allergy and Immunology

## 2015-12-17 VITALS — BP 124/84 | HR 88 | Resp 18

## 2015-12-17 DIAGNOSIS — K219 Gastro-esophageal reflux disease without esophagitis: Secondary | ICD-10-CM

## 2015-12-17 DIAGNOSIS — B369 Superficial mycosis, unspecified: Secondary | ICD-10-CM

## 2015-12-17 DIAGNOSIS — J4540 Moderate persistent asthma, uncomplicated: Secondary | ICD-10-CM | POA: Diagnosis not present

## 2015-12-17 DIAGNOSIS — J3089 Other allergic rhinitis: Secondary | ICD-10-CM

## 2015-12-17 MED ORDER — FLUCONAZOLE 150 MG PO TABS: ORAL_TABLET | ORAL | 0 refills | Status: DC

## 2015-12-17 NOTE — Patient Instructions (Addendum)
  1. Continue Dulera 200 two inhalations two times per day with spacer.  2. "Action Plan" for asthma flare up:   A. add QVAR 80- 3 inhalations 3 times per day to [facility]  2. Continue Prilosec 40 mg twice a day  3. Continue Nasonex one spray each nostril twice a day  4. Continue Allegra 180 one tablet one time per day. Discontinue Singulair  5. Continue immunotherapy and EpiPen  6. Diflucan 150 mg tablet today and repeat in one week  6. If needed:   A. nasal saline spray multiple times a day  B. Azelastine 1-2 sprays each nostril one-2 times a day  C. Proventil HFA 2 puffs or albuterol nebulization every 4-6 hours  7. Return to clinic in 12 weeks or earlier if there is a problem  8. Obtain fall flu vaccine

## 2015-12-17 NOTE — Progress Notes (Signed)
Follow-up Note  Referring Provider: Heron Nay, PA Primary Provider: Heron Nay, PA Date of Office Visit: 12/17/2015  Subjective:   Misty [name] (DOB: [birth year]) is a 20 y.o. female [facility] in re-evaluation of the following:  HPI: Misty [name] returns to this clinic in reevaluation of her asthma and allergic rhinitis treated with immunotherapy and history of intermittent urticaria. I last saw her in this clinic on 11/19/2015 at which time we gave her a combination inhaler for her asthma.  Because of insurance issues her Symbicort was changed to [facility]. She believes that this is helped her to some degree. She has a little bit less shortness of breath and wheezing while using her Dulera. She did visit with Dr. Rachael Darby who gave her a nebulizer and informed her that she could use it up to 4 times per day if needed.  Apparently Lavergne has a history of nocturnal deoxygenation followed by her pulmonologist but there was a decision made recently not to start her on oxygen at nighttime.  Her nose is doing quite well and her reflux has not been causing her problem and she's not had any urticaria.  She continues on immunotherapy without incident.    Medication List      albuterol 108 (90 Base) MCG/ACT inhaler Commonly known as:  PROVENTIL HFA Inhale 2 puffs into the lungs every 4 (four) hours as needed for wheezing or shortness of breath.   azelastine 0.1 % nasal spray Commonly known as:  ASTELIN USE 1-2 SPRAYS IN EACH NOSTRIL ONCE OR TWICE DAILY IF NEEDED   beclomethasone 80 MCG/ACT inhaler Commonly known as:  QVAR INHALE TWO PUFFS ONCE DAILY TO PREVENT COUGH OR WHEEZE. RINSE MOUTH AFTER USE.   budesonide-formoterol 160-4.5 MCG/ACT inhaler Commonly known as:  SYMBICORT Inhale two puffs twice daily to prevent cough or wheeze. Rinse mouth after use.   busPIRone 10 MG tablet Commonly known as:  BUSPAR Take 20 mg by mouth 3  (three) times daily.   CRYSELLE-28 PO Take by mouth.   dicyclomine 10 MG capsule Commonly known as:  BENTYL Take 10 mg by mouth 4 (four) times daily.   fexofenadine 180 MG tablet Commonly known as:  ALLEGRA TAKE ONE TABLET BY MOUTH DAILY   FIBER PO Take by mouth daily.   FLUoxetine 20 MG capsule Commonly known as:  PROZAC Take 20 mg by mouth.   hydrOXYzine 25 MG tablet Commonly known as:  ATARAX/VISTARIL Take 25 mg by mouth 4 (four) times daily.   lisinopril 10 MG tablet Commonly known as:  PRINIVIL,ZESTRIL Take 10 mg by mouth every morning.   lithium carbonate 300 MG CR tablet Commonly known as:  LITHOBID Take 600 mg by mouth 2 (two) times daily.   MELATONIN PO Take 6 mg by mouth at bedtime.   metoprolol 50 MG tablet Commonly known as:  LOPRESSOR Take 50 mg by mouth 2 (two) times daily.   MULTIVITAMIN ADULT PO Take by mouth.   NASONEX 50 MCG/ACT nasal spray Generic drug:  mometasone Place 1 spray into the nose 2 (two) times daily.   omeprazole 40 MG capsule Commonly known as:  PRILOSEC Take 40 mg by mouth 2 (two) times daily.   PAZEO 0.7 % Soln Generic drug:  Olopatadine HCl Apply 1 drop to eye daily as needed.   prazosin 1 MG capsule Commonly known as:  MINIPRESS Take 2 mg by mouth at bedtime.   risperiDONE 2 MG  tablet Commonly known as:  RISPERDAL Take 2 mg by mouth at bedtime.   VITAMIN B-2 PO Take by mouth.   WATER PILLS PO Take by mouth 3 (three) times daily.       Past Medical History:  Diagnosis Date  . Allergic rhinitis   . Anxiety   . Depression   . GERD (gastroesophageal reflux disease)   . HTN (hypertension) 07/13/12  . IBS (irritable bowel syndrome)   . Obesity   . OSA on CPAP   . Psoriasis     Past Surgical History:  Procedure Laterality Date  . ADENOIDECTOMY    . SINOSCOPY    . TONSILLECTOMY AND ADENOIDECTOMY  Age 20   Deviated septum corrected at same time    Allergies  Allergen Reactions  . Ultram  [Tramadol] Rash    Pt reports itching as well  . Adhesive [Tape] Rash    Review of systems negative except as noted in HPI / PMHx or noted below:  Review of Systems  Constitutional: Negative.   HENT: Negative.   Eyes: Negative.   Respiratory: Negative.   Cardiovascular: Negative.   Gastrointestinal: Negative.   Genitourinary: Negative.   Musculoskeletal: Negative.   Skin: Negative.   Neurological: Negative.   Endo/Heme/Allergies: Negative.   Psychiatric/Behavioral: Negative.      Objective:   Vitals:   12/17/15 1346  BP: 124/84  Pulse: 88  Resp: 18          Physical Exam  Constitutional: She is well-developed, well-nourished, and in no distress.  HENT:  Head: Normocephalic.  Right Ear: Tympanic membrane, external ear and ear canal normal.  Left Ear: Tympanic membrane, external ear and ear canal normal.  Nose: Nose normal. No mucosal edema or rhinorrhea.  Mouth/Throat: Uvula is midline, oropharynx is clear and moist and mucous membranes are normal. No oropharyngeal exudate.  Eyes: Conjunctivae are normal.  Neck: Trachea normal. No tracheal tenderness present. No tracheal deviation present. No thyromegaly present.  Cardiovascular: Normal rate, regular rhythm, S1 normal, S2 normal and normal heart sounds.   No murmur heard. Pulmonary/Chest: Breath sounds normal. No stridor. No respiratory distress. She has no wheezes. She has no rales.  Musculoskeletal: She exhibits no edema.  Lymphadenopathy:       Head (right side): No tonsillar adenopathy present.       Head (left side): No tonsillar adenopathy present.    She has no cervical adenopathy.  Neurological: She is alert. Gait normal.  Skin: Rash (Angular chelitis bilateral) noted. She is not diaphoretic. No erythema. Nails show no clubbing.  Psychiatric: Mood and affect normal.    Diagnostics: Oxygen saturation at room air at rest was 98%   Spirometry was performed and demonstrated an FEV1 of 1.82 at 54 % of  predicted.  Assessment and Plan:   1. Asthma, moderate persistent, well-controlled   2. Other allergic rhinitis   3. Gastroesophageal reflux disease, esophagitis presence not specified   4. Fungal infection of skin     1. Continue Dulera 200 two inhalations two times per day with spacer.  2. "Action Plan" for asthma flare up:   A. add QVAR 80- 3 inhalations 3 times per day to [facility]  2. Continue Prilosec 40 mg twice a day  3. Continue Nasonex one spray each nostril twice a day  4. Continue Allegra 180 one tablet one time per day. Discontinue Singulair  5. Continue immunotherapy and EpiPen  6. Diflucan 150 mg tablet today and repeat in one week  6. If needed:   A. nasal saline spray multiple times a day  B. Azelastine 1-2 sprays each nostril one-2 times a day  C. Proventil HFA 2 puffs or albuterol nebulization every 4-6 hours  7. Return to clinic in 12 weeks or earlier if there is a problem  8. Obtain fall flu vaccine  Crystal appears to be doing better and she will continue to use anti-inflammatory agents for her respiratory tract and immunotherapy and therapy directed against reflux as noted above. The lesions on the corners of her mouth are probably fungal in origin and we'll treat her with Diflucan using 150 mg once a week for the next 2 weeks. She will keep in contact with me noting her response. If she does well I will see her back in this clinic in 12 weeks or earlier if there is a problem.  Laurette SchimkeEric Kozlow, MD [facility]

## 2015-12-18 ENCOUNTER — Encounter: Payer: Self-pay | Admitting: Allergy and Immunology

## 2015-12-18 ENCOUNTER — Ambulatory Visit (INDEPENDENT_AMBULATORY_CARE_PROVIDER_SITE_OTHER): Payer: Medicaid Other | Admitting: *Deleted

## 2015-12-18 DIAGNOSIS — J309 Allergic rhinitis, unspecified: Secondary | ICD-10-CM

## 2015-12-25 ENCOUNTER — Ambulatory Visit (INDEPENDENT_AMBULATORY_CARE_PROVIDER_SITE_OTHER): Payer: Medicaid Other | Admitting: *Deleted

## 2015-12-25 DIAGNOSIS — J309 Allergic rhinitis, unspecified: Secondary | ICD-10-CM

## 2015-12-29 ENCOUNTER — Ambulatory Visit (INDEPENDENT_AMBULATORY_CARE_PROVIDER_SITE_OTHER): Payer: Medicaid Other | Admitting: *Deleted

## 2015-12-29 DIAGNOSIS — J309 Allergic rhinitis, unspecified: Secondary | ICD-10-CM

## 2016-02-21 ENCOUNTER — Other Ambulatory Visit: Payer: Self-pay | Admitting: Allergy and Immunology

## 2016-03-10 ENCOUNTER — Ambulatory Visit: Payer: Medicaid Other | Admitting: Allergy and Immunology

## 2016-03-24 ENCOUNTER — Encounter: Payer: Self-pay | Admitting: Allergy and Immunology

## 2016-03-24 ENCOUNTER — Ambulatory Visit (INDEPENDENT_AMBULATORY_CARE_PROVIDER_SITE_OTHER): Payer: Medicaid Other | Admitting: Allergy and Immunology

## 2016-03-24 VITALS — BP 150/92 | HR 92 | Resp 20

## 2016-03-24 DIAGNOSIS — B370 Candidal stomatitis: Secondary | ICD-10-CM

## 2016-03-24 DIAGNOSIS — K219 Gastro-esophageal reflux disease without esophagitis: Secondary | ICD-10-CM | POA: Diagnosis not present

## 2016-03-24 DIAGNOSIS — Z7722 Contact with and (suspected) exposure to environmental tobacco smoke (acute) (chronic): Secondary | ICD-10-CM

## 2016-03-24 DIAGNOSIS — J3089 Other allergic rhinitis: Secondary | ICD-10-CM

## 2016-03-24 DIAGNOSIS — J4540 Moderate persistent asthma, uncomplicated: Secondary | ICD-10-CM | POA: Diagnosis not present

## 2016-03-24 DIAGNOSIS — K224 Dyskinesia of esophagus: Secondary | ICD-10-CM

## 2016-03-24 DIAGNOSIS — B3781 Candidal esophagitis: Secondary | ICD-10-CM

## 2016-03-24 MED ORDER — MOMETASONE FURO-FORMOTEROL FUM 200-5 MCG/ACT IN AERO: INHALATION_SPRAY | RESPIRATORY_TRACT | 5 refills | Status: DC

## 2016-03-24 MED ORDER — FLUCONAZOLE 150 MG PO TABS: ORAL_TABLET | ORAL | 0 refills | Status: DC

## 2016-03-24 MED ORDER — BECLOMETHASONE DIPROPIONATE 80 MCG/ACT IN AERS: INHALATION_SPRAY | RESPIRATORY_TRACT | 5 refills | Status: DC

## 2016-03-24 MED ORDER — AZELASTINE HCL 0.1 % NA SOLN
NASAL | 5 refills | Status: DC
Start: 2016-03-24 — End: 2017-04-26

## 2016-03-24 MED ORDER — FLUTICASONE PROPIONATE 50 MCG/ACT NA SUSP
NASAL | 5 refills | Status: DC
Start: 2016-03-24 — End: 2016-12-14

## 2016-03-24 MED ORDER — RANITIDINE HCL 300 MG PO TABS: 300.0000 mg | ORAL_TABLET | Freq: Every day | ORAL | 5 refills | Status: DC

## 2016-03-24 MED ORDER — OMEPRAZOLE 40 MG PO CPDR: DELAYED_RELEASE_CAPSULE | ORAL | 5 refills | Status: DC

## 2016-03-24 MED ORDER — ALBUTEROL SULFATE HFA 108 (90 BASE) MCG/ACT IN AERS: INHALATION_SPRAY | RESPIRATORY_TRACT | 1 refills | Status: DC

## 2016-03-24 NOTE — Progress Notes (Signed)
Follow-up Note  Referring Provider: Heron Nay, PA Primary Provider: Heron Nay, PA Date of Office Visit: 03/24/2016  Subjective:   Misty [name] (DOB: Oct 18, 1995) is a 21 y.o. female who [facility] in re-evaluation of the following:  HPI: Taira returns to this clinic in reevaluation of her asthma and allergic rhinitis and history of intermittent urticaria and reflux. I've not seen her in his clinic since November 2017.  She is apparently had 3 rather significant respiratory tract infections with involvement of both her upper and lower airway since December. She's been treated with 3 antibiotics and has been treated with systemic steroids on at least on one occasion. There was an assumption that her predisposition to developing infections may have been related to the use of stelara and this immunomodulatory agent has been discontinued. Her last injection was beginning of January.  As expected her asthma became somewhat active during these infections. Presently she's using a short acting bronchodilator about 2 times per week. She continues on her Dulera. Her mom and dad smoke tobacco products inside the house and car.  She is having problems swallowing. She has a history of reflux treated with omeprazole twice a day but over the course of the past few months during the timeframe in which she has had all these infections she has developed nausea throughout the day and she obstructs her swallowing mechanics. Whenever she tries to swallow there is a slow and sometimes difficult transition of food from her mount to her esophagus and occasionally the food gets stuck and she has to vomit. She's never had an upper endoscopy performed but has had a colonoscopy with a gastroenterologist in the past. She does not drink any caffeine.  She has not been having any problems with her urticaria.  She did receive the flu vaccine this year.  Allergies  as of 03/24/2016      Reactions   Ultram [tramadol] Rash   Pt reports itching as well   Adhesive [tape] Rash      Medication List      albuterol (2.5 MG/3ML) 0.083% nebulizer solution Commonly known as:  PROVENTIL Take 2.5 mg by nebulization every 4 (four) hours as needed for wheezing or shortness of breath.   albuterol 108 (90 Base) MCG/ACT inhaler Commonly known as:  PROVENTIL HFA Inhale 2 puffs into the lungs every 4 (four) hours as needed for wheezing or shortness of breath.   beclomethasone 80 MCG/ACT inhaler Commonly known as:  QVAR INHALE TWO PUFFS ONCE DAILY TO PREVENT COUGH OR WHEEZE. RINSE MOUTH AFTER USE.   buPROPion 150 MG 24 hr tablet Commonly known as:  WELLBUTRIN XL Take 150 mg by mouth 2 (two) times daily.   buPROPion 300 MG 24 hr tablet Commonly known as:  WELLBUTRIN XL Take 300 mg by mouth every morning.   busPIRone 10 MG tablet Commonly known as:  BUSPAR Take 20 mg by mouth 3 (three) times daily.   dicyclomine 10 MG capsule Commonly known as:  BENTYL Take 10 mg by mouth 4 (four) times daily.   DULERA 200-5 MCG/ACT Aero Generic drug:  mometasone-formoterol Inhale 2 puffs into the lungs 2 (two) times daily.   eletriptan 40 MG tablet Commonly known as:  RELPAX Take 1 pill at onset of HA, may repeat in 2 hours if needed but no more than 2 in 24 hours   ferrous sulfate 325 (65 FE) MG tablet See admin instructions.  fexofenadine 180 MG tablet Commonly known as:  ALLEGRA TAKE ONE TABLET BY MOUTH DAILY   FIBER PO Take by mouth daily.   fluocinonide 0.05 % external solution Commonly known as:  LIDEX APPLY TO SCALP TWICE DAILY AS NEEDED   GRALISE 600 MG Tabs Generic drug:  Gabapentin (Once-Daily) TAKE THREE TABLETS BY MOUTH EVERY DAY with evening meal   hydrOXYzine 25 MG tablet Commonly known as:  ATARAX/VISTARIL Take 25 mg by mouth 4 (four) times daily.   INVEGA TRINZA 819 MG/2.625ML Susp Generic drug:  Paliperidone Palmitate Inject one  (1) unit into the muscle once every ten weeks   lisinopril 10 MG tablet Commonly known as:  PRINIVIL,ZESTRIL Take 10 mg by mouth every morning.   lithium carbonate 300 MG CR tablet Commonly known as:  LITHOBID Take by mouth.   MELATONIN PO Take 6 mg by mouth at bedtime.   metoprolol 50 MG tablet Commonly known as:  LOPRESSOR Take 50 mg by mouth 2 (two) times daily.   MULTIVITAMIN ADULT PO Take by mouth.   omeprazole 40 MG capsule Commonly known as:  PRILOSEC Take 40 mg by mouth 2 (two) times daily.   PAZEO 0.7 % Soln Generic drug:  Olopatadine HCl Apply 1 drop to eye daily as needed.   potassium chloride 10 MEQ tablet Commonly known as:  K-DUR Take 10 mEq by mouth daily.   prazosin 1 MG capsule Commonly known as:  MINIPRESS Take 2 mg by mouth at bedtime.   risperiDONE 2 MG tablet Commonly known as:  RISPERDAL Take 2 mg by mouth at bedtime.   triamcinolone cream 0.1 % Commonly known as:  KENALOG APPLY TO LEGS, FEET TWICE DAILY FOR TWO WEEKS   VIBERZI 100 MG Tabs Generic drug:  Eluxadoline Take 1 tablet by mouth 2 (two) times daily.   VITAMIN B-2 PO Take by mouth.   vitamin C 500 MG tablet Commonly known as:  ASCORBIC ACID Take by mouth.   Vitamin D (Ergocalciferol) 50000 units Caps capsule Commonly known as:  DRISDOL TAKE ONE CAPSULE BY MOUTH ONCE WEEKLY FOR EIGHT WEEKS   WATER PILLS PO Take by mouth 3 (three) times daily.   zonisamide 100 MG capsule Commonly known as:  ZONEGRAN TAKE ONE CAPSULE BY MOUTH AT BEDTIME FOR 2 WEEKS, THEN TAKE TWO CAPSULES AT BEDTIME       Past Medical History:  Diagnosis Date  . Allergic rhinitis   . Anxiety   . Depression   . GERD (gastroesophageal reflux disease)   . HTN (hypertension) 07/13/12  . IBS (irritable bowel syndrome)   . Obesity   . OSA on CPAP   . Psoriasis     Past Surgical History:  Procedure Laterality Date  . ADENOIDECTOMY    . SINOSCOPY    . TONSILLECTOMY AND ADENOIDECTOMY  Age 35    Deviated septum corrected at same time    Review of systems negative except as noted in HPI / PMHx or noted below:  Review of Systems  Constitutional: Negative.   HENT: Negative.   Eyes: Negative.   Respiratory: Negative.   Cardiovascular: Negative.   Gastrointestinal: Negative.   Genitourinary: Negative.   Musculoskeletal: Negative.   Skin: Negative.   Neurological: Negative.   Endo/Heme/Allergies: Negative.   Psychiatric/Behavioral: Negative.      Objective:   Vitals:   03/24/16 1629  BP: (!) 150/92  Pulse: 92  Resp: 20          Physical Exam  Constitutional: She is  well-developed, well-nourished, and in no distress.  HENT:  Head: Normocephalic.  Right Ear: Tympanic membrane, external ear and ear canal normal.  Left Ear: Tympanic membrane, external ear and ear canal normal.  Nose: Nose normal. No mucosal edema or rhinorrhea.  Mouth/Throat: Uvula is midline, oropharynx is clear and moist and mucous membranes are normal. No oropharyngeal exudate.  Eyes: Conjunctivae are normal.  Neck: Trachea normal. No tracheal tenderness present. No tracheal deviation present. No thyromegaly present.  Cardiovascular: Normal rate, regular rhythm, S1 normal, S2 normal and normal heart sounds.   No murmur heard. Pulmonary/Chest: Breath sounds normal. No stridor. No respiratory distress. She has no wheezes. She has no rales.  Musculoskeletal: She exhibits no edema.  Lymphadenopathy:       Head (right side): No tonsillar adenopathy present.       Head (left side): No tonsillar adenopathy present.    She has no cervical adenopathy.  Neurological: She is alert. Gait normal.  Skin: No rash noted. She is not diaphoretic. No erythema. Nails show no clubbing.  Psychiatric: Mood and affect normal.    Diagnostics:    Spirometry was performed and demonstrated an FEV1 of 2.08 at 62 % of predicted.  Assessment and Plan:   1. Asthma, moderate persistent, well-controlled   2. Other  allergic rhinitis   3. Gastroesophageal reflux disease, esophagitis presence not specified   4. Secondhand smoke exposure   5. Thrush of mouth and esophagus (HCC)   6. Esophageal dysmotility     1. Continue Dulera 200 two inhalations two times per day with spacer.  2. "Action Plan" for asthma flare up:   A. add QVAR 80- 3 [facility]ations [facility]  2. Continue Prilosec 40 mg twice a day and add Ranitidine 300mg  one time per day in the evening  3. Continue Nasonex one spray each nostril twice a day  4. Continue Allegra 180 one tablet one time per day.    5. Diflucan 150 - one tablet every other day for 10 days. (5 tablets)  6. If needed:   A. nasal saline spray multiple times a day  B. Azelastine 1-2 sprays each nostril one-2 times a day  C. Proventil HFA 2 puffs or albuterol nebulization every 4-6 hours  7. Contact clinic in one week regarding swallowing issue. Upper endoscopy?  8. Return to clinic in 3 weeks or earlier if there is a problem  9. No tobacco smoke exposure inside the house or car.  10. Obtain barium swallow  Crystal has some new developments in her health including a very bad history of recurrent respiratory tract infections which was probably secondary to her immunosuppressive therapy for psoriasis. Fortunately, it does not sound as though that immunosuppressive agent will be continuing. As well, she now has a swallowing difficulty. I will make the assumption that this is secondary to fungal overgrowth given the fact that she's had several antibiotics and systemic steroid and immunosuppression and treat her with Diflucan. If she does not have an adequate response to this therapy and I think we need to consider the possibility of eosinophilic esophagitis and she will need to have an upper endoscopy performed. As a screening test we will obtain a barium swallow. She will remain on anti-inflammatory therapy for her respiratory tract as stated above.  Laurette SchimkeEric  Shem Plemmons, MD Allergy / Immunology [facility]

## 2016-03-24 NOTE — Patient Instructions (Addendum)
  1. Continue Dulera 200 two inhalations two times per day with spacer.  2. "Action Plan" for asthma flare up:   A. add QVAR 80- 3 inhalations 3 times per day to [facility]  2. Continue Prilosec 40 mg twice a day and add Ranitidine 300mg  one time per day in the evening  3. Continue Nasonex one spray each nostril twice a day  4. Continue Allegra 180 one tablet one time per day.    5. Diflucan 150 - one tablet every other day for 10 days. (5 tablets)  6. If needed:   A. nasal saline spray multiple times a day  B. Azelastine 1-2 sprays each nostril one-2 times a day  C. Proventil HFA 2 puffs or albuterol nebulization every 4-6 hours  7. Contact clinic in one week regarding swallowing issue. Upper endoscopy?  8. Return to clinic in 3 weeks or earlier if there is a problem  9. No tobacco smoke exposure inside the house or car.  10. Obtain barium swallow

## 2016-04-04 ENCOUNTER — Inpatient Hospital Stay (HOSPITAL_COMMUNITY)
Admission: AD | Admit: 2016-04-04 | Discharge: 2016-04-07 | DRG: 092 | Disposition: A | Discharge status: Home / Self Care | Payer: Medicaid Other | Source: Other Acute Inpatient Hospital | Attending: Internal Medicine | Admitting: Internal Medicine

## 2016-04-04 DIAGNOSIS — G4733 Obstructive sleep apnea (adult) (pediatric): Secondary | ICD-10-CM | POA: Diagnosis present

## 2016-04-04 DIAGNOSIS — T43591A Poisoning by other antipsychotics and neuroleptics, accidental (unintentional), initial encounter: Secondary | ICD-10-CM | POA: Diagnosis present

## 2016-04-04 DIAGNOSIS — G251 Drug-induced tremor: Principal | ICD-10-CM | POA: Diagnosis present

## 2016-04-04 DIAGNOSIS — Z833 Family history of diabetes mellitus: Secondary | ICD-10-CM

## 2016-04-04 DIAGNOSIS — Z818 Family history of other mental and behavioral disorders: Secondary | ICD-10-CM

## 2016-04-04 DIAGNOSIS — Z9109 Other allergy status, other than to drugs and biological substances: Secondary | ICD-10-CM

## 2016-04-04 DIAGNOSIS — F603 Borderline personality disorder: Secondary | ICD-10-CM | POA: Diagnosis present

## 2016-04-04 DIAGNOSIS — F319 Bipolar disorder, unspecified: Secondary | ICD-10-CM | POA: Diagnosis present

## 2016-04-04 DIAGNOSIS — K219 Gastro-esophageal reflux disease without esophagitis: Secondary | ICD-10-CM | POA: Diagnosis present

## 2016-04-04 DIAGNOSIS — Z79899 Other long term (current) drug therapy: Secondary | ICD-10-CM | POA: Diagnosis not present

## 2016-04-04 DIAGNOSIS — I10 Essential (primary) hypertension: Secondary | ICD-10-CM | POA: Diagnosis present

## 2016-04-04 DIAGNOSIS — K580 Irritable bowel syndrome with diarrhea: Secondary | ICD-10-CM | POA: Diagnosis present

## 2016-04-04 DIAGNOSIS — Z813 Family history of other psychoactive substance abuse and dependence: Secondary | ICD-10-CM | POA: Diagnosis not present

## 2016-04-04 DIAGNOSIS — R531 Weakness: Secondary | ICD-10-CM | POA: Diagnosis present

## 2016-04-04 DIAGNOSIS — F411 Generalized anxiety disorder: Secondary | ICD-10-CM | POA: Diagnosis present

## 2016-04-04 DIAGNOSIS — T56891A Toxic effect of other metals, accidental (unintentional), initial encounter: Secondary | ICD-10-CM | POA: Diagnosis present

## 2016-04-04 DIAGNOSIS — R0602 Shortness of breath: Secondary | ICD-10-CM

## 2016-04-04 DIAGNOSIS — J4540 Moderate persistent asthma, uncomplicated: Secondary | ICD-10-CM | POA: Diagnosis present

## 2016-04-04 DIAGNOSIS — Z885 Allergy status to narcotic agent status: Secondary | ICD-10-CM

## 2016-04-04 DIAGNOSIS — Z7722 Contact with and (suspected) exposure to environmental tobacco smoke (acute) (chronic): Secondary | ICD-10-CM | POA: Diagnosis present

## 2016-04-04 DIAGNOSIS — Z6841 Body Mass Index (BMI) 40.0 and over, adult: Secondary | ICD-10-CM | POA: Diagnosis not present

## 2016-04-04 DIAGNOSIS — Z8249 Family history of ischemic heart disease and other diseases of the circulatory system: Secondary | ICD-10-CM

## 2016-04-04 DIAGNOSIS — Z888 Allergy status to other drugs, medicaments and biological substances status: Secondary | ICD-10-CM | POA: Diagnosis not present

## 2016-04-04 DIAGNOSIS — R112 Nausea with vomiting, unspecified: Secondary | ICD-10-CM | POA: Diagnosis present

## 2016-04-04 DIAGNOSIS — R000 Tachycardia, unspecified: Secondary | ICD-10-CM | POA: Diagnosis present

## 2016-04-04 DIAGNOSIS — F3162 Bipolar disorder, current episode mixed, moderate: Secondary | ICD-10-CM | POA: Diagnosis not present

## 2016-04-04 DIAGNOSIS — T56894D Toxic effect of other metals, undetermined, subsequent encounter: Secondary | ICD-10-CM | POA: Diagnosis not present

## 2016-04-04 DIAGNOSIS — F913 Oppositional defiant disorder: Secondary | ICD-10-CM | POA: Diagnosis present

## 2016-04-04 MED ORDER — DICYCLOMINE HCL 10 MG PO CAPS
10.0000 mg | ORAL_CAPSULE | Freq: Four times a day (QID) | ORAL | Status: DC
  Administered 2016-04-05 – 2016-04-07 (×10): 10 mg via ORAL
  Filled 2016-04-04 (×10): qty 1

## 2016-04-04 MED ORDER — PANTOPRAZOLE SODIUM 40 MG PO TBEC
40.0000 mg | DELAYED_RELEASE_TABLET | Freq: Every day | ORAL | Status: DC
  Administered 2016-04-05 – 2016-04-07 (×3): 40 mg via ORAL
  Filled 2016-04-04 (×3): qty 1

## 2016-04-04 MED ORDER — METOPROLOL TARTRATE 50 MG PO TABS
50.0000 mg | ORAL_TABLET | Freq: Two times a day (BID) | ORAL | Status: DC
  Administered 2016-04-05 (×2): 50 mg via ORAL
  Filled 2016-04-04 (×2): qty 1

## 2016-04-04 MED ORDER — LISINOPRIL 10 MG PO TABS
10.0000 mg | ORAL_TABLET | Freq: Every day | ORAL | Status: DC
  Administered 2016-04-05: 10 mg via ORAL
  Filled 2016-04-04: qty 1

## 2016-04-04 MED ORDER — RISPERIDONE 2 MG PO TABS
2.0000 mg | ORAL_TABLET | Freq: Every day | ORAL | Status: DC
  Administered 2016-04-05 – 2016-04-06 (×3): 2 mg via ORAL
  Filled 2016-04-04 (×2): qty 4
  Filled 2016-04-04: qty 1

## 2016-04-04 MED ORDER — ALBUTEROL SULFATE (2.5 MG/3ML) 0.083% IN NEBU: 2.5000 mg | INHALATION_SOLUTION | RESPIRATORY_TRACT | Status: DC | PRN

## 2016-04-04 MED ORDER — MOMETASONE FURO-FORMOTEROL FUM 200-5 MCG/ACT IN AERO
2.0000 | INHALATION_SPRAY | Freq: Two times a day (BID) | RESPIRATORY_TRACT | Status: DC
  Administered 2016-04-05 – 2016-04-07 (×4): 2 via RESPIRATORY_TRACT
  Filled 2016-04-04 (×2): qty 8.8

## 2016-04-04 MED ORDER — BUPROPION HCL ER (XL) 150 MG PO TB24
150.0000 mg | ORAL_TABLET | Freq: Two times a day (BID) | ORAL | Status: DC
  Administered 2016-04-05: 150 mg via ORAL
  Filled 2016-04-04 (×2): qty 1

## 2016-04-04 NOTE — Progress Notes (Signed)
[facility]. Patient able to walk to bed, she is awake oriented, and call her mother to update her. Awaiting admitting MD orders. Will continue to monitor.

## 2016-04-04 NOTE — H&P (Signed)
History and Physical   Misty [name] XBJ:[id number] DOB: [birth year] DOA: 04/04/2016  PCP: Heron Nay, PA  Chief Complaint: Dizziness/weakness  HPI:  Misty [name] is a 20 year old Caucasian female past medical history significant for obesity, depression, asthma, GERD, essential hypertension. Patient presented to [facility] complaining of dizziness, weakness, cough, tremor, and vomiting/diarrhea. Patient had been taking lithium 1200 mg twice daily when aforementioned symptoms developed. Patient saw an outside physician at an Urgent Care who did not check a lithium level, but recommended cessation of lithium therapy. Patient reports stopping lithium 3 days ago. However, her symptoms persisted and she sought medical attention at [facility]. No alleviating or exacerbating factors for patient's symptoms. She states she normally does not have dizziness or weakness. No recent antibiotic use, though patient states she was recently prescribed fluconazole for "yeast infection in my throat." Patient denies any sick contacts. Patient denies chest pain, fever, chills, or skin breakdown. She states she is otherwise been in her normal state of health.  OSH Course:  Patient was hemodynamically stable at home outside hospital, though she had persistent sinus tachycardia. Lab work was notable for white blood cell count 19, hemoglobin 13.2, BUN 17, creatinine 0.9. Lithium level was 2.20. Troponin negative. Urinalysis negative. Urine trashing negative. Patient was given 2 L bolus normal saline and transferred to [facility] for further evaluation.  Review of Systems: A complete ROS was obtained; pertinent positives negatives are denoted in the HPI. Otherwise, all systems are negative.   Past Medical History:  Diagnosis Date  . Allergic rhinitis   . Anxiety   . Depression   . GERD (gastroesophageal reflux disease)   . HTN (hypertension) 07/13/12  . IBS (irritable bowel syndrome)   . Obesity   . OSA on  CPAP   . Psoriasis    Social History   Social History  . Marital status: Single    Spouse name: N/A  . Number of children: N/A  . Years of education: N/A   Occupational History  . STUDENT Minor    11th grade [employer]   Social History Main Topics  . Smoking status: Passive Smoke Exposure - Never Smoker  . Smokeless tobacco: Never Used  . Alcohol use No  . Drug use: No  . Sexual activity: No   Other Topics Concern  . Not on file   Social History Narrative  . No narrative on file  Patient denies drug use or alcohol use. Lives at home with parents.   Family History  Problem Relation Age of Onset  . Depression Mother   . Allergic rhinitis Mother   . Depression Maternal Grandmother   . Diabetes Maternal Grandmother   . Hypertension Maternal Grandmother   . Bipolar disorder Father   . Drug abuse Father   . Hypertension Father     Physical Exam: Vitals:   04/04/16 2341  BP: (!) 141/82  Temp: 97.4 F (36.3 C)  TempSrc: Oral  Weight: (!) 186.7 kg (411 lb 11.2 oz)  Height: 5\' 3"  (1.6 m)   General: Mildly lethargic but able to participate in interview and exam. ENT: Grossly normal hearing, MMM. Cardiovascular: Tachycardic but regular rhythm  Respiratory: CTA bilaterally. No wheezes or crackles. Normal respiratory effort. Abdomen: Soft, non-tender. Bowel sounds present. Obese. Skin: No rash or induration seen on limited exam. Musculoskeletal: Grossly normal tone BUE/BLE. Appropriate ROM.  Psychiatric: Grossly normal mood and affect. Neurologic: Moves all extremities in coordinated fashion. No tremor.   I  have personally reviewed the following labs, culture data, and imaging studies.  Assessment/Plan: Misty [name] is a 21 year old Caucasian female past medical history significant for obesity, depression, asthma, GERD, essential hypertension. [facility] of dizziness, weakness, cough, tremor, and vomiting/diarrhea.  #1  Lithium toxicity Patient's broad constellation of symptoms, including weakness, mild tremor, cough, nausea/vomiting, is likely explained by lithium toxicity. Lithium level at outside hospital was 2.20 with normal renal function. No acute need for hemodialysis. Patient is well-appearing on physical examination, though she is mildly lethargic. Plan is to recheck renal function and lithium level. Will give 1 L bolus normal saline for hydration.  #2 Tachycardia/leukocytosis Patient has mild tachycardia and leukocytosis with white blood cell count of 19 obtained at outside hospital. No clear source of infection. May be reflection of underlying stress from lithium toxicity. Patient's cough and congestion may be secondary to a viral upper respiratory infection. Highly doubt underlying pneumonia. No recent hospitalization or significant risk factors for multidrug resistant organisms. Plan is to recheck CBC, check influenza, RVP, GI pathogen panel. Hold antibiotics at this time. Hydration as noted above. Recheck EKG.  #3 Essential hypertension Patient is mildly hypertensive on arrival. Will continue home lisinopril and metoprolol tartrate.  #4 Asthma, moderate persistent No wheezing on exam. Doubt underlying asthma exacerbation. Will continue home asthma medications.  DVT prophylaxis: Subq Lovenox Code Status: Full Code Disposition Plan: Anticipate D/C home in 2-5 days Consults called: N/A Admission status: Inpatient. Stepdown unit given persistent tachycardia.   Tyrone SageMatthew Jameson Morrow, MD [facility] [phone]  If 7PM-7AM, please contact night-coverage [email or web address] Password [email or web address]

## 2016-04-05 ENCOUNTER — Inpatient Hospital Stay (HOSPITAL_COMMUNITY): Payer: Medicaid Other

## 2016-04-05 DIAGNOSIS — I10 Essential (primary) hypertension: Secondary | ICD-10-CM

## 2016-04-05 DIAGNOSIS — T56891A Toxic effect of other metals, accidental (unintentional), initial encounter: Secondary | ICD-10-CM | POA: Diagnosis present

## 2016-04-05 DIAGNOSIS — F3162 Bipolar disorder, current episode mixed, moderate: Secondary | ICD-10-CM

## 2016-04-05 HISTORY — DX: Toxic effect of other metals, accidental (unintentional), initial encounter: T56.891A

## 2016-04-05 LAB — RESPIRATORY PANEL BY PCR
Adenovirus: NOT DETECTED
BORDETELLA PERTUSSIS-RVPCR: NOT DETECTED
CORONAVIRUS HKU1-RVPPCR: NOT DETECTED
Chlamydophila pneumoniae: NOT DETECTED
Coronavirus 229E: NOT DETECTED
Coronavirus NL63: NOT DETECTED
Coronavirus OC43: NOT DETECTED
INFLUENZA A-RVPPCR: NOT DETECTED
Influenza B: NOT DETECTED
Metapneumovirus: NOT DETECTED
Mycoplasma pneumoniae: NOT DETECTED
PARAINFLUENZA VIRUS 2-RVPPCR: NOT DETECTED
PARAINFLUENZA VIRUS 3-RVPPCR: NOT DETECTED
PARAINFLUENZA VIRUS 4-RVPPCR: NOT DETECTED
Parainfluenza Virus 1: NOT DETECTED
RESPIRATORY SYNCYTIAL VIRUS-RVPPCR: NOT DETECTED
RHINOVIRUS / ENTEROVIRUS - RVPPCR: NOT DETECTED

## 2016-04-05 LAB — URINALYSIS, ROUTINE W REFLEX MICROSCOPIC
Bilirubin Urine: NEGATIVE
GLUCOSE, UA: NEGATIVE mg/dL
Hgb urine dipstick: NEGATIVE
KETONES UR: NEGATIVE mg/dL
Nitrite: NEGATIVE
PH: 7 (ref 5.0–8.0)
PROTEIN: NEGATIVE mg/dL
Specific Gravity, Urine: 1.005 (ref 1.005–1.030)

## 2016-04-05 LAB — CBC WITH DIFFERENTIAL/PLATELET
BASOS PCT: 0 %
Basophils Absolute: 0 10*3/uL (ref 0.0–0.1)
EOS ABS: 0.4 10*3/uL (ref 0.0–0.7)
EOS PCT: 3 %
HCT: 36.6 % (ref 36.0–46.0)
HEMOGLOBIN: 12.2 g/dL (ref 12.0–15.0)
Lymphocytes Relative: 17 %
Lymphs Abs: 2.6 10*3/uL (ref 0.7–4.0)
MCH: 28 pg (ref 26.0–34.0)
MCHC: 33.3 g/dL (ref 30.0–36.0)
MCV: 84.1 fL (ref 78.0–100.0)
Monocytes Absolute: 1.4 10*3/uL — ABNORMAL HIGH (ref 0.1–1.0)
Monocytes Relative: 9 %
NEUTROS PCT: 71 %
Neutro Abs: 10.8 10*3/uL — ABNORMAL HIGH (ref 1.7–7.7)
PLATELETS: 224 10*3/uL (ref 150–400)
RBC: 4.35 MIL/uL (ref 3.87–5.11)
RDW: 13.9 % (ref 11.5–15.5)
WBC: 14.8 10*3/uL — AB (ref 4.0–10.5)

## 2016-04-05 LAB — COMPREHENSIVE METABOLIC PANEL
ALK PHOS: 121 U/L (ref 38–126)
ALT: 42 U/L (ref 14–54)
AST: 29 U/L (ref 15–41)
Albumin: 3.6 g/dL (ref 3.5–5.0)
Anion gap: 10 (ref 5–15)
BILIRUBIN TOTAL: 0.5 mg/dL (ref 0.3–1.2)
BUN: 10 mg/dL (ref 6–20)
CALCIUM: 9.6 mg/dL (ref 8.9–10.3)
CO2: 21 mmol/L — ABNORMAL LOW (ref 22–32)
CREATININE: 0.88 mg/dL (ref 0.44–1.00)
Chloride: 107 mmol/L (ref 101–111)
Glucose, Bld: 105 mg/dL — ABNORMAL HIGH (ref 65–99)
Potassium: 3.6 mmol/L (ref 3.5–5.1)
Sodium: 138 mmol/L (ref 135–145)
TOTAL PROTEIN: 6.3 g/dL — AB (ref 6.5–8.1)

## 2016-04-05 LAB — MAGNESIUM: Magnesium: 2.1 mg/dL (ref 1.7–2.4)

## 2016-04-05 LAB — MRSA PCR SCREENING: MRSA BY PCR: NEGATIVE

## 2016-04-05 LAB — PROTIME-INR
INR: 1.1
Prothrombin Time: 14.2 seconds (ref 11.4–15.2)

## 2016-04-05 LAB — LACTIC ACID, PLASMA
LACTIC ACID, VENOUS: 2.5 mmol/L — AB (ref 0.5–1.9)
Lactic Acid, Venous: 1.2 mmol/L (ref 0.5–1.9)

## 2016-04-05 LAB — LITHIUM LEVEL: Lithium Lvl: 1.69 mmol/L (ref 0.60–1.20)

## 2016-04-05 LAB — INFLUENZA PANEL BY PCR (TYPE A & B)
INFLAPCR: NEGATIVE
INFLBPCR: NEGATIVE

## 2016-04-05 LAB — PHOSPHORUS: PHOSPHORUS: 1.7 mg/dL — AB (ref 2.5–4.6)

## 2016-04-05 LAB — HIV ANTIBODY (ROUTINE TESTING W REFLEX): HIV Screen 4th Generation wRfx: NONREACTIVE

## 2016-04-05 MED ORDER — BUPROPION HCL ER (XL) 150 MG PO TB24: 300.0000 mg | ORAL_TABLET | Freq: Every morning | ORAL | Status: DC

## 2016-04-05 MED ORDER — POTASSIUM CHLORIDE ER 10 MEQ PO TBCR
10.0000 meq | EXTENDED_RELEASE_TABLET | Freq: Every day | ORAL | Status: DC
  Administered 2016-04-05 – 2016-04-07 (×3): 10 meq via ORAL
  Filled 2016-04-05 (×5): qty 1

## 2016-04-05 MED ORDER — VITAMIN D 1000 UNITS PO TABS
2000.0000 [IU] | ORAL_TABLET | Freq: Every day | ORAL | Status: DC
  Administered 2016-04-05 – 2016-04-07 (×3): 2000 [IU] via ORAL
  Filled 2016-04-05 (×3): qty 2

## 2016-04-05 MED ORDER — ALBUTEROL SULFATE (2.5 MG/3ML) 0.083% IN NEBU: 2.5000 mg | INHALATION_SOLUTION | RESPIRATORY_TRACT | Status: DC | PRN

## 2016-04-05 MED ORDER — ENOXAPARIN SODIUM 100 MG/ML ~~LOC~~ SOLN
90.0000 mg | SUBCUTANEOUS | Status: DC
  Administered 2016-04-05 – 2016-04-06 (×2): 90 mg via SUBCUTANEOUS
  Filled 2016-04-05 (×2): qty 1

## 2016-04-05 MED ORDER — PRAZOSIN HCL 2 MG PO CAPS: 2.0000 mg | ORAL_CAPSULE | Freq: Every day | ORAL | Status: DC

## 2016-04-05 MED ORDER — BUSPIRONE HCL 5 MG PO TABS
20.0000 mg | ORAL_TABLET | Freq: Three times a day (TID) | ORAL | Status: DC
  Administered 2016-04-05 – 2016-04-06 (×3): 20 mg via ORAL
  Filled 2016-04-05 (×3): qty 1

## 2016-04-05 MED ORDER — FUROSEMIDE 40 MG PO TABS
40.0000 mg | ORAL_TABLET | Freq: Every day | ORAL | Status: DC
  Administered 2016-04-05 – 2016-04-07 (×3): 40 mg via ORAL
  Filled 2016-04-05 (×3): qty 1

## 2016-04-05 MED ORDER — BUPROPION HCL ER (XL) 150 MG PO TB24
300.0000 mg | ORAL_TABLET | Freq: Every day | ORAL | Status: DC
  Administered 2016-04-06 – 2016-04-07 (×2): 300 mg via ORAL
  Filled 2016-04-05 (×2): qty 2

## 2016-04-05 MED ORDER — SODIUM CHLORIDE 0.9 % IV BOLUS (SEPSIS): 500.0000 mL | Freq: Once | INTRAVENOUS | Status: DC

## 2016-04-05 MED ORDER — SODIUM CHLORIDE 0.9 % IV SOLN
INTRAVENOUS | Status: DC
  Administered 2016-04-05: 17:00:00 via INTRAVENOUS

## 2016-04-05 MED ORDER — SODIUM CHLORIDE 0.9 % IV BOLUS (SEPSIS)
1000.0000 mL | Freq: Once | INTRAVENOUS | Status: AC
  Administered 2016-04-05: 1000 mL via INTRAVENOUS

## 2016-04-05 MED ORDER — ACETAMINOPHEN 325 MG PO TABS
650.0000 mg | ORAL_TABLET | ORAL | Status: DC | PRN
Start: 2016-04-05 — End: 2016-04-07
  Administered 2016-04-06 – 2016-04-07 (×3): 650 mg via ORAL
  Filled 2016-04-05 (×4): qty 2

## 2016-04-05 MED ORDER — ADULT MULTIVITAMIN W/MINERALS CH
1.0000 | ORAL_TABLET | Freq: Every day | ORAL | Status: DC
  Administered 2016-04-05 – 2016-04-07 (×3): 1 via ORAL
  Filled 2016-04-05 (×3): qty 1

## 2016-04-05 MED ORDER — METOPROLOL TARTRATE 12.5 MG HALF TABLET
12.5000 mg | ORAL_TABLET | Freq: Two times a day (BID) | ORAL | Status: DC
  Administered 2016-04-05 – 2016-04-07 (×3): 12.5 mg via ORAL
  Filled 2016-04-05 (×4): qty 1

## 2016-04-05 NOTE — Progress Notes (Signed)
Report given earlier. Patient being transferred via float RN and NT to 5W29 in bed.  Patient alert and oriented to person, place, time and situation in stable condition.

## 2016-04-05 NOTE — Care Management Note (Signed)
Case Management Note  Patient Details  Name: Misty [name] MRN: [id number] Date of Birth: 1996-01-06  Subjective/Objective:   Pt lives with mother and father, has no equipment and was independent PTA per mother.  She also states that pt has some cognitive deficits and will not recall what physician tells her - mother requests telephone call @ [phone] to discuss condition.                  Expected Discharge Plan:  Home/Self Care  Discharge planning Services  CM Consult  Status of Service:  In process, will continue to follow  Magdalene RiverMayo, Genavie Boettger T, RN 04/05/2016, 2:28 PM

## 2016-04-05 NOTE — Progress Notes (Signed)
CRITICAL VALUE ALERT  Critical value received:  Lactic acid 2.5 and lithium level 1.65 (down from 2.20)  Date of notification:  04/05/2016  Time of notification:  0200  Critical value read back:Yes.    Nurse who received alert:  L Hitt RN  MD notified (1st page):  NP Schorr  Time of first page:  0330  MD notified (2nd page):  Time of second page:  Responding MD:    Time MD responded:

## 2016-04-05 NOTE — Progress Notes (Addendum)
[place] TEAM 1 - Stepdown/ICU TEAM  Misty [name]  ZOX:[id number] DOB: 09-30-1995 DOA: 04/04/2016 PCP: Heron Nay, PA    Brief Narrative:  21 year old female w/ a history of obesity, depression, asthma, GERD, and HTN who presented to [facility] complaining of dizziness, weakness, cough, tremor, and vomiting w/ diarrhea. Patient had been taking lithium 1200 mg twice daily when aforementioned symptoms developed. Patient saw an outside physician at an Urgent Care who did not check a lithium level, but recommended cessation of lithium therapy. Patient stopped 3 days prior to her presentation. Her symptoms persisted and she sought medical attention at [facility]. Patient was hemodynamically stable at the outside hospital, though she had persistent sinus tachycardia. Lab work was notable for white blood cell count 19, hemoglobin 13.2, BUN 17, creatinine 0.9. Lithium level was 2.20. Patient was transferred to [facility] for further evaluation.  Subjective: The patient feels that she remained somewhat confused and her thinking is "slow."  She denies substernal chest pressure, vomiting, abdominal pain, or shortness of breath.  She admits to very limited exercise tolerance due to dyspnea on exertion.  Assessment & Plan:  Lithium toxicity Continue to hydrate - lithium level improving - safe for transfer to telemetry bed - cont IVF - rehceck Li level in AM   Tachycardia Resolved with patient in NSR  Hypertension Blood pressure currently well controlled  Asthma quiescent  Morbid obesity - Body mass index is 72.93 kg/m.   Psychiatric hx Pt has a complex psychiatric hx and is on multiple phsych meds - discussed w/ mother who tells me the pt is followed by the [facility], through [facility] - will ask Psych to see while inpt to suggest alternate meds to replace [name], which is too dangerous for her to continue taking    DVT prophylaxis: lovenox  Code Status: FULL  CODE Family Communication: spoke w/ mother via telephone  Disposition Plan: transfer to tele bed - follow Li level until < 1.5 or sx resolved - begin PT/OT   Consultants:  none  Procedures: none  Antimicrobials:  none   Objective: Blood pressure 129/67, pulse 74, temperature 97.8 F (36.6 C), temperature source Oral, resp. rate 10, height 5\' 3"  (1.6 m), weight (!) 186.7 kg (411 lb 11.2 oz), SpO2 100 %.  Intake/Output Summary (Last 24 hours) at 04/05/16 1518 Last data filed at 04/05/16 1222  Gross per 24 hour  Intake             2240 ml  Output             2375 ml  Net             -135 ml   Filed Weights   04/04/16 2341  Weight: (!) 186.7 kg (411 lb 11.2 oz)    Examination: General: No acute respiratory distress Lungs: Clear to auscultation bilaterally without wheezes or crackles Cardiovascular: Regular rate and rhythm without murmur gallop or rub normal S1 and S2 Abdomen: Nontender, nondistended, soft, bowel sounds positive, no rebound, no ascites, no appreciable mass Extremities: No significant cyanosis, clubbing, or edema bilateral lower extremities  CBC:  Recent Labs Lab 04/05/16 0020  WBC 14.8*  NEUTROABS 10.8*  HGB 12.2  HCT 36.6  MCV 84.1  PLT 224   Basic Metabolic Panel:  Recent Labs Lab 04/05/16 0020  NA 138  K 3.6  CL 107  CO2 21*  GLUCOSE 105*  BUN 10  CREATININE 0.88  CALCIUM 9.6  MG 2.1  PHOS 1.7*   GFR: Estimated Creatinine Clearance: 170.8 mL/min (by C-G formula based on SCr of 0.88 mg/dL).  Liver Function Tests:  Recent Labs Lab 04/05/16 0020  AST 29  ALT 42  ALKPHOS 121  BILITOT 0.5  PROT 6.3*  ALBUMIN 3.6    Coagulation Profile:  Recent Labs Lab 04/05/16 0020  INR 1.10    HbA1C: Hgb A1c MFr Bld  Date/Time Value Ref Range Status  02/25/2008 08:21 AM  4.6 - 6.1 % Final   5.1 (NOTE)   The ADA recommends the following therapeutic goal for glycemic   control related to Hgb A1C measurement:   Goal of Therapy:   <  7.0% Hgb A1C   Reference: American Diabetes Association: Clinical Practice   Recommendations 2008, Diabetes Care,  2008, 31:(Suppl 1).    CBG: No results for input(s): GLUCAP in the last 168 hours.  Recent Results (from the past 240 hour(s))  MRSA PCR Screening     Status: None   Collection Time: 04/05/16 12:02 AM  Result Value Ref Range Status   MRSA by PCR NEGATIVE NEGATIVE Final    Comment:        The GeneXpert MRSA Assay (FDA approved for NASAL specimens only), is one component of a comprehensive MRSA colonization surveillance program. It is not intended to diagnose MRSA infection nor to guide or monitor treatment for MRSA infections.   Respiratory Panel by PCR     Status: None   Collection Time: 04/05/16  1:36 AM  Result Value Ref Range Status   Adenovirus NOT DETECTED NOT DETECTED Final   Coronavirus 229E NOT DETECTED NOT DETECTED Final   Coronavirus HKU1 NOT DETECTED NOT DETECTED Final   Coronavirus NL63 NOT DETECTED NOT DETECTED Final   Coronavirus OC43 NOT DETECTED NOT DETECTED Final   Metapneumovirus NOT DETECTED NOT DETECTED Final   Rhinovirus / Enterovirus NOT DETECTED NOT DETECTED Final   Influenza A NOT DETECTED NOT DETECTED Final   Influenza B NOT DETECTED NOT DETECTED Final   Parainfluenza Virus 1 NOT DETECTED NOT DETECTED Final   Parainfluenza Virus 2 NOT DETECTED NOT DETECTED Final   Parainfluenza Virus 3 NOT DETECTED NOT DETECTED Final   Parainfluenza Virus 4 NOT DETECTED NOT DETECTED Final   Respiratory Syncytial Virus NOT DETECTED NOT DETECTED Final   Bordetella pertussis NOT DETECTED NOT DETECTED Final   Chlamydophila pneumoniae NOT DETECTED NOT DETECTED Final   Mycoplasma pneumoniae NOT DETECTED NOT DETECTED Final     Scheduled Meds: . buPROPion  150 mg Oral BID  . dicyclomine  10 mg Oral QID  . enoxaparin (LOVENOX) injection  90 mg Subcutaneous Q24H  . lisinopril  10 mg Oral Daily  . metoprolol  50 mg Oral BID  . mometasone-formoterol  2 puff  Inhalation BID  . pantoprazole  40 mg Oral Daily  . risperiDONE  2 mg Oral QHS  . sodium chloride  500 mL Intravenous Once     LOS: 1 day   Lonia BloodJeffrey T. McClung, MD [facility] Office  [phone] Pager - Text Page per Amion as per below:  On-Call/Text Page:      [email or web address]      password [email or web address]  If 7PM-7AM, please contact night-coverage [email or web address] Password [email or web address] 04/05/2016, 3:18 PM

## 2016-04-05 NOTE — Progress Notes (Signed)
Attempted to report to 5W.

## 2016-04-06 ENCOUNTER — Encounter (HOSPITAL_COMMUNITY): Payer: Self-pay

## 2016-04-06 DIAGNOSIS — T56891A Toxic effect of other metals, accidental (unintentional), initial encounter: Secondary | ICD-10-CM

## 2016-04-06 DIAGNOSIS — F319 Bipolar disorder, unspecified: Secondary | ICD-10-CM

## 2016-04-06 DIAGNOSIS — Z818 Family history of other mental and behavioral disorders: Secondary | ICD-10-CM

## 2016-04-06 DIAGNOSIS — Z888 Allergy status to other drugs, medicaments and biological substances status: Secondary | ICD-10-CM

## 2016-04-06 DIAGNOSIS — F603 Borderline personality disorder: Secondary | ICD-10-CM

## 2016-04-06 DIAGNOSIS — Z813 Family history of other psychoactive substance abuse and dependence: Secondary | ICD-10-CM

## 2016-04-06 DIAGNOSIS — T56894D Toxic effect of other metals, undetermined, subsequent encounter: Secondary | ICD-10-CM

## 2016-04-06 DIAGNOSIS — Z79899 Other long term (current) drug therapy: Secondary | ICD-10-CM

## 2016-04-06 LAB — BASIC METABOLIC PANEL WITH GFR
Anion gap: 8 (ref 5–15)
BUN: 9 mg/dL (ref 6–20)
CO2: 20 mmol/L — ABNORMAL LOW (ref 22–32)
Calcium: 9.7 mg/dL (ref 8.9–10.3)
Chloride: 111 mmol/L (ref 101–111)
Creatinine, Ser: 0.81 mg/dL (ref 0.44–1.00)
GFR calc Af Amer: >60 mL/min (ref >60)
GFR calc non Af Amer: >60 mL/min (ref >60)
Glucose, Bld: 111 mg/dL — ABNORMAL HIGH (ref 65–99)
Potassium: 3.5 mmol/L (ref 3.5–5.1)
Sodium: 139 mmol/L (ref 135–145)

## 2016-04-06 LAB — LITHIUM LEVEL: Lithium Lvl: 0.99 mmol/L (ref 0.60–1.20)

## 2016-04-06 MED ORDER — LAMOTRIGINE 25 MG PO TABS
50.0000 mg | ORAL_TABLET | Freq: Two times a day (BID) | ORAL | Status: DC
  Administered 2016-04-06 – 2016-04-07 (×3): 50 mg via ORAL
  Filled 2016-04-06 (×3): qty 2

## 2016-04-06 MED ORDER — KETOROLAC TROMETHAMINE 15 MG/ML IJ SOLN
15.0000 mg | Freq: Three times a day (TID) | INTRAMUSCULAR | Status: DC | PRN
  Administered 2016-04-06: 15 mg via INTRAVENOUS
  Filled 2016-04-06: qty 1

## 2016-04-06 MED ORDER — BUSPIRONE HCL 15 MG PO TABS
15.0000 mg | ORAL_TABLET | Freq: Three times a day (TID) | ORAL | Status: DC
  Administered 2016-04-06 – 2016-04-07 (×3): 15 mg via ORAL
  Filled 2016-04-06 (×3): qty 1

## 2016-04-06 MED ORDER — GABAPENTIN 400 MG PO CAPS
400.0000 mg | ORAL_CAPSULE | Freq: Three times a day (TID) | ORAL | Status: DC
  Administered 2016-04-06 – 2016-04-07 (×3): 400 mg via ORAL
  Filled 2016-04-06 (×3): qty 1

## 2016-04-06 NOTE — Evaluation (Signed)
Physical Therapy Evaluation Patient Details Name: Misty [name] MRN: [id number] DOB: 25-Oct-1995 Today's Date: 04/06/2016   History of Present Illness  Misty [name] is a 21 year old Caucasian female past medical history significant for obesity, depression, asthma, GERD, essential hypertension. Patient presented to [facility] complaining of dizziness, weakness, cough, tremor, and vomiting/diarrhea. Patient had been taking lithium 1200 mg twice daily when aforementioned symptoms developed.  Clinical Impression  Patient presents with decreased mobility due to weakness and imbalance and will benefit from skilled PT in the acute setting to allow d/c home with family support.  Feel a bariatric rollator may aide with community mobility and increased activity level.      Follow Up Recommendations No PT follow up    Equipment Recommendations  Other (comment) (bariatric rollator)    Recommendations for Other Services       Precautions / Restrictions Precautions Precautions: Fall Precaution Comments: reports h/o falls, but none recent      Mobility  Bed Mobility               General bed mobility comments: up in recliner  Transfers Overall transfer level: Needs assistance   Transfers: Sit to/from Stand Sit to Stand: Supervision         General transfer comment: for safety  Ambulation/Gait Ambulation/Gait assistance: Min guard;Min assist Ambulation Distance (Feet): 130 Feet Assistive device: 4-wheeled walker Gait Pattern/deviations: Step-through pattern;Decreased stride length;Trunk flexed;Wide base of support     General Gait Details: shuffling unsteady steps around bed with min A HHA (due to not enough room for rollator); in hallway with rollator minguard for balance, stopped twice, c/o not feeling well, but stated able to walk back to room, SpO2 on RA 100% (was on O2 at rest)  Stairs            Wheelchair Mobility    Modified Rankin (Stroke Patients Only)        Balance Overall balance assessment: Needs assistance   Sitting balance-Leahy Scale: Good       Standing balance-Leahy Scale: Fair Standing balance comment: difficulty with dynamic tasks due to unsteadiness and weight                             Pertinent Vitals/Pain Pain Assessment: No/denies pain    Home Living Family/patient expects to be discharged to:: Private residence Living Arrangements: Parent Available Help at Discharge: Family Type of Home: House Home Access: Stairs to enter Entrance Stairs-Rails: None Secretary/administrator of Steps: 2 Home Layout: One level Home Equipment: None      Prior Function Level of Independence: Needs assistance   Gait / Transfers Assistance Needed: states mother holds her hand at times due to unsteadiness (reports due to her weight)  ADL's / Homemaking Assistance Needed: mother has been assisting her with tub transfer        Hand Dominance        Extremity/Trunk Assessment   Upper Extremity Assessment Upper Extremity Assessment: Overall WFL for tasks assessed    Lower Extremity Assessment Lower Extremity Assessment: Overall WFL for tasks assessed       Communication   Communication: No difficulties  Cognition Arousal/Alertness: Awake/alert Behavior During Therapy: Flat affect Overall Cognitive Status: Within Functional Limits for tasks assessed                      General Comments      Exercises  Assessment/Plan    PT Assessment Patient needs continued PT services  PT Problem List Decreased activity tolerance;Decreased balance;Decreased mobility;Decreased knowledge of use of DME       PT Treatment Interventions DME instruction;Gait training;Stair training;Functional mobility training;Balance training;Therapeutic activities    PT Goals (Current goals can be found in the Care Plan section)  Acute Rehab PT Goals Patient Stated Goal: to go home PT Goal Formulation: With  patient Time For Goal Achievement: 04/10/16 Potential to Achieve Goals: Good    Frequency Min 3X/week   Barriers to discharge        Co-evaluation               End of Session   Activity Tolerance: Patient limited by fatigue Patient left: with call bell/phone within reach;in chair   PT Visit Diagnosis: Unsteadiness on feet (R26.81)         Time: [id number] PT Time Calculation (min) (ACUTE ONLY): 29 min   Charges:   PT Evaluation $PT Eval Moderate Complexity: 1 Procedure PT Treatments $Gait Training: 8-22 mins   PT G CodesElray Mcgregor:         Fizza Scales 04/06/2016, 12:43 PM  Sheran Lawlessyndi Aliceson Dolbow, PT [phone] 04/06/2016

## 2016-04-06 NOTE — Progress Notes (Signed)
PROGRESS NOTE  Misty [name] ZOX:[id number] DOB: 1995/09/06 DOA: 04/04/2016 PCP: Heron Nay, PA   LOS: 2 days   Brief Narrative: Misty [name] is a 21 year old female w/ a history of obesity, depression, asthma, GERD, and HTN who presented to [facility] complaining of dizziness, weakness, cough, tremor, and vomiting w/ diarrhea. Patient had been taking lithium 1200 mg twice daily when symptoms developed. Patient saw an outside physician at an Urgent Care who did not check a lithium level, but recommended cessation of lithium therapy. Patient stopped 3 days prior to her presentation to [facility]. She was hemodynamically stable at the outside hospital, though she had persistent sinus tachycardia. Lab work was notable for white blood cell count 19, hemoglobin 13.2, BUN 17, creatinine 0.9. Lithium level was 2.20. Patient was given 2 L bolus normal saline and transferred to [facility] for further evaluation.  Assessment & Plan: Active Problems:   Lithium toxicity  Lithium Toxicity  - Patient has extensive psychiatric history and is on multiple medications. She presented with Lithium level 2.20 at [facility]. Started on IV fluids and Lithium was stopped.  - Today Lithium 0.99 and symptoms are improving.  - Psychiatry consulted to determine alternative to Lithium.   Bipolar disorder / borderline personality disorder/oppositional defiant disorder - significant polypharmacy - per psych - patient wishes to go to inpatient psychiatric hospital   Hypertension - Chronic. Well controlled. Continue home medications   Asthma - Chronic. No acute complications. No wheezing    Morbid Obesity - Body mass index is 72.93 kg/m. Currently seeing a nutritionist.    DVT prophylaxis: Lovenox/SCD's Code Status: Full Family Communication: No family at bedside, communicated with patient. Disposition Plan: TBD  Consultants:   Psychiatry  Procedures:    None  Antimicrobials:  None  Subjective: Patient says she is feeling better, but she doesn't want to go back home and wants to discuss with psychiatrist about going to an inpatient setting.  Objective: Vitals:   04/05/16 2156 04/05/16 2200 04/05/16 2215 04/06/16 0600  BP: (!) 124/57 (!) 124/57 133/72 122/73  Pulse: 85 81 94 62  Resp: (!) 22 (!) 22 18 20   Temp: 98.1 F (36.7 C)  97.4 F (36.3 C) 98.3 F (36.8 C)  TempSrc: Oral  Oral   SpO2: 100% 100% 100% 98%  Weight:      Height:        Intake/Output Summary (Last 24 hours) at 04/06/16 1044 Last data filed at 04/06/16 0815  Gross per 24 hour  Intake           308.33 ml  Output             4375 ml  Net         -4066.67 ml   Filed Weights   04/04/16 2341  Weight: (!) 186.7 kg (411 lb 11.2 oz)    Examination: Constitutional: NAD Vitals:   04/05/16 2156 04/05/16 2200 04/05/16 2215 04/06/16 0600  BP: (!) 124/57 (!) 124/57 133/72 122/73  Pulse: 85 81 94 62  Resp: (!) 22 (!) 22 18 20   Temp: 98.1 F (36.7 C)  97.4 F (36.3 C) 98.3 F (36.8 C)  TempSrc: Oral  Oral   SpO2: 100% 100% 100% 98%  Weight:      Height:       Eyes: PERRL, lids and conjunctivae normal ENMT: Mucous membranes are moist. No oropharyngeal exudates Respiratory: clear to auscultation bilaterally, no wheezing, no crackles. Normal respiratory effort. Cardiovascular: Regular  rate and rhythm, no murmurs / rubs / gallops. No LE edema. 2+ pedal pulses. No carotid bruits.  Abdomen: no tenderness. Bowel sounds positive.  Skin: no rashes, lesions, ulcers. No induration Neurologic: CN 2-12 grossly intact. Strength 5/5 in all 4. Slight hyper reflexive DTRs Psychiatric: Normal judgment and insight. Alert and oriented x 3. Flat Affect.    Data Reviewed: I have personally reviewed following labs and imaging studies  CBC:  Recent Labs Lab 04/05/16 0020  WBC 14.8*  NEUTROABS 10.8*  HGB 12.2  HCT 36.6  MCV 84.1  PLT 224   Basic Metabolic  Panel:  Recent Labs Lab 04/05/16 0020 04/06/16 0703  NA 138 139  K 3.6 3.5  CL 107 111  CO2 21* 20*  GLUCOSE 105* 111*  BUN 10 9  CREATININE 0.88 0.81  CALCIUM 9.6 9.7  MG 2.1  --   PHOS 1.7*  --    GFR: Estimated Creatinine Clearance: 185.6 mL/min (by C-G formula based on SCr of 0.81 mg/dL). Liver Function Tests:  Recent Labs Lab 04/05/16 0020  AST 29  ALT 42  ALKPHOS 121  BILITOT 0.5  PROT 6.3*  ALBUMIN 3.6   No results for input(s): LIPASE, AMYLASE in the last 168 hours. No results for input(s): AMMONIA in the last 168 hours. Coagulation Profile:  Recent Labs Lab 04/05/16 0020  INR 1.10   Cardiac Enzymes: No results for input(s): CKTOTAL, CKMB, CKMBINDEX, TROPONINI in the last 168 hours. BNP (last 3 results) No results for input(s): PROBNP in the last 8760 hours. HbA1C: No results for input(s): HGBA1C in the last 72 hours. CBG: No results for input(s): GLUCAP in the last 168 hours. Lipid Profile: No results for input(s): CHOL, HDL, LDLCALC, TRIG, CHOLHDL, LDLDIRECT in the last 72 hours. Thyroid Function Tests: No results for input(s): TSH, T4TOTAL, FREET4, T3FREE, THYROIDAB in the last 72 hours. Anemia Panel: No results for input(s): VITAMINB12, FOLATE, FERRITIN, TIBC, IRON, RETICCTPCT in the last 72 hours. Urine analysis:    Component Value Date/Time   COLORURINE YELLOW 04/05/2016 0013   APPEARANCEUR HAZY (A) 04/05/2016 0013   LABSPEC 1.005 04/05/2016 0013   PHURINE 7.0 04/05/2016 0013   GLUCOSEU NEGATIVE 04/05/2016 0013   HGBUR NEGATIVE 04/05/2016 0013   BILIRUBINUR NEGATIVE 04/05/2016 0013   KETONESUR NEGATIVE 04/05/2016 0013   PROTEINUR NEGATIVE 04/05/2016 0013   UROBILINOGEN 0.2 03/01/2012 0605   NITRITE NEGATIVE 04/05/2016 0013   LEUKOCYTESUR SMALL (A) 04/05/2016 0013   Sepsis Labs: Invalid input(s): PROCALCITONIN, LACTICIDVEN  Recent Results (from the past 240 hour(s))  MRSA PCR Screening     Status: None   Collection Time: 04/05/16  12:02 AM  Result Value Ref Range Status   MRSA by PCR NEGATIVE NEGATIVE Final    Comment:        The GeneXpert MRSA Assay (FDA approved for NASAL specimens only), is one component of a comprehensive MRSA colonization surveillance program. It is not intended to diagnose MRSA infection nor to guide or monitor treatment for MRSA infections.   Respiratory Panel by PCR     Status: None   Collection Time: 04/05/16  1:36 AM  Result Value Ref Range Status   Adenovirus NOT DETECTED NOT DETECTED Final   Coronavirus 229E NOT DETECTED NOT DETECTED Final   Coronavirus HKU1 NOT DETECTED NOT DETECTED Final   Coronavirus NL63 NOT DETECTED NOT DETECTED Final   Coronavirus OC43 NOT DETECTED NOT DETECTED Final   Metapneumovirus NOT DETECTED NOT DETECTED Final   Rhinovirus / Enterovirus  NOT DETECTED NOT DETECTED Final   Influenza A NOT DETECTED NOT DETECTED Final   Influenza B NOT DETECTED NOT DETECTED Final   Parainfluenza Virus 1 NOT DETECTED NOT DETECTED Final   Parainfluenza Virus 2 NOT DETECTED NOT DETECTED Final   Parainfluenza Virus 3 NOT DETECTED NOT DETECTED Final   Parainfluenza Virus 4 NOT DETECTED NOT DETECTED Final   Respiratory Syncytial Virus NOT DETECTED NOT DETECTED Final   Bordetella pertussis NOT DETECTED NOT DETECTED Final   Chlamydophila pneumoniae NOT DETECTED NOT DETECTED Final   Mycoplasma pneumoniae NOT DETECTED NOT DETECTED Final      Radiology Studies: Dg Chest Port 1 View  Result Date: 04/05/2016 CLINICAL DATA:  Shortness of breath. EXAM: PORTABLE CHEST 1 VIEW COMPARISON:  Radiograph of April 04, 2016. FINDINGS: The heart size and mediastinal contours are within normal limits. Both lungs are clear. No pneumothorax or pleural effusion is noted. The visualized skeletal structures are unremarkable. IMPRESSION: No acute cardiopulmonary abnormality seen. Electronically Signed   By: Lupita Raider, M.D.   On: 04/05/2016 07:24     Scheduled Meds: . buPROPion  300 mg  Oral Daily  . busPIRone  20 mg Oral TID  . cholecalciferol  2,000 Units Oral Daily  . dicyclomine  10 mg Oral QID  . enoxaparin (LOVENOX) injection  90 mg Subcutaneous Q24H  . furosemide  40 mg Oral Daily  . metoprolol  12.5 mg Oral BID  . mometasone-formoterol  2 puff Inhalation BID  . multivitamin with minerals  1 tablet Oral Daily  . pantoprazole  40 mg Oral Daily  . potassium chloride  10 mEq Oral Daily  . risperiDONE  2 mg Oral QHS  . sodium chloride  500 mL Intravenous Once   Continuous Infusions: . sodium chloride 50 mL/hr at 04/05/16 1714       Ermalinda Memos, PA-S [facility] 04/06/2016  Attending MD note  Patient was seen, examined,treatment plan was discussed with the PA-S Ermalinda Memos.  I have personally reviewed the clinical findings, lab, imaging studies and management of this patient in detail. I agree with the documentation, as recorded by the PA-S.   Patient is a 21 year old female w/ a history of obesity, depression, asthma, GERD, and HTN who presented to [facility] complaining of dizziness, weakness, cough, tremor, and vomiting w/ diarrhea, found to have an elevated lithium level and clinical presentation consistent with lithium toxicity  On Exam: Gen. exam: Morbidly obese Caucasian female awake, alert, not in any distress, quite drowsy Chest: Good air entry bilaterally, no rhonchi or rales CVS: S1-S2 regular, no murmurs Abdomen: Soft, nontender and nondistended Neurology: Non-focal Skin: No rash or lesions   Plan  Lithium Toxicity  -Lithium levels improved and now normal with hydration, discontinue IV fluids today.  She still appears quite drowsy and feels "sluggish today". -Psychiatry consulted to evaluate for change medications and discontinue lithium  Bipolar disorder / borderline personality disorder/oppositional defiant disorder -significant polypharmacy -per psych -patient wishes to go to inpatient psychiatric hospital    Hypertension -Chronic. Well controlled. Continue home medications   Asthma -Chronic. No acute complications. No wheezing    Morbid Obesity -Body mass index is 72.93 kg/m. Currently seeing a nutritionist.    Rest as above   Pamella Pert, MD, PhD [facility] Pager [phone]  If 7PM-7AM, please contact night-coverage [email or web address] Password [facility] 04/06/2016, 10:44 AM

## 2016-04-06 NOTE — Plan of Care (Signed)
Problem: Education: Goal: Knowledge of [facility] Education information/materials will improve Outcome: Progressing POC reviewed with pt.   

## 2016-04-06 NOTE — Evaluation (Signed)
Occupational Therapy Evaluation Patient Details Name: Misty [name] MRN: [id number] DOB: 1995-03-23 Today's Date: 04/06/2016    History of Present Illness Misty [name] is a 21 year old Caucasian female past medical history significant for obesity, depression, asthma, GERD, essential hypertension. Patient presented to [facility] complaining of dizziness, weakness, cough, tremor, and vomiting/diarrhea. Patient had been taking lithium 1200 mg twice daily when aforementioned symptoms developed.   Clinical Impression   PT admitted with dizziness due to lithium. Pt currently with functional limitiations due to the deficits listed below (see OT problem list). PTA was receiving (A) from mother for adls. Pt currently limited in LB adls due to body habitus not from dizziness. OT to educate on AE to maximize independence. Pt will benefit from skilled OT to increase their independence and safety with adls and balance to allow discharge home without follow.     Follow Up Recommendations  No OT follow up    Equipment Recommendations  Other (comment) (AE for LB)    Recommendations for Other Services       Precautions / Restrictions Precautions Precautions: Fall Precaution Comments: reports h/o falls, but none recent Restrictions Weight Bearing Restrictions: No      Mobility Bed Mobility               General bed mobility comments: in recliner on arrival. Pt sleeping in recliner this admission  Transfers Overall transfer level: Needs assistance   Transfers: Sit to/from Stand Sit to Stand: Min guard         General transfer comment: for safety    Balance Overall balance assessment: Needs assistance   Sitting balance-Leahy Scale: Good       Standing balance-Leahy Scale: Fair Standing balance comment: difficulty with dynamic tasks due to unsteadiness and weight                            ADL Overall ADL's : Needs assistance/impaired Eating/Feeding:  Independent   Grooming: Wash/dry hands   Upper Body Bathing: Supervision/ safety   Lower Body Bathing: Moderate assistance Lower Body Bathing Details (indicate cue type and reason): unable to reach toes due to body habitus Upper Body Dressing : Supervision/safety       Toilet Transfer: Supervision/safety   Toileting- Clothing Manipulation and Hygiene: Supervision/safety       Functional mobility during ADLs: Min guard General ADL Comments: Pt transfered ~5 ft requires rest break. Pt needed 3 rest breaks to tranfer to bathroom and back in the room due to dizziness 6 out 10. Pt reports wanting to play basketball but it being difficult due to being sick and medication causing incr weight gain     Vision         Perception     Praxis      Pertinent Vitals/Pain Pain Assessment: No/denies pain (6 out 10 dizziness)     Hand Dominance Right   Extremity/Trunk Assessment Upper Extremity Assessment Upper Extremity Assessment: Overall WFL for tasks assessed   Lower Extremity Assessment Lower Extremity Assessment: Overall WFL for tasks assessed   Cervical / Trunk Assessment Cervical / Trunk Assessment: Normal   Communication Communication Communication: No difficulties   Cognition Arousal/Alertness: Awake/alert Behavior During Therapy: Flat affect Overall Cognitive Status: Within Functional Limits for tasks assessed                     General Comments       Exercises  Shoulder Instructions      Home Living Family/patient expects to be discharged to:: Private residence Living Arrangements: Parent Available Help at Discharge: Family Type of Home: House Home Access: Stairs to enter Secretary/administratorntrance Stairs-Number of Steps: 2 Entrance Stairs-Rails: None Home Layout: One level     Bathroom Shower/Tub: Tub/shower unit         Home Equipment: None   Additional Comments: reports holding mother arm for transfers but not into the house due to mother  having personal balance deficits. Mother is home 24/7 with patient      Prior Functioning/Environment Level of Independence: Needs assistance  Gait / Transfers Assistance Needed: states mother holds her hand at times due to unsteadiness (reports due to her weight) ADL's / Homemaking Assistance Needed: mother has been assisting her with tub transfer            OT Problem List: Decreased activity tolerance;Impaired balance (sitting and/or standing);Decreased knowledge of use of DME or AE      OT Treatment/Interventions: Self-care/ADL training;Therapeutic exercise;DME and/or AE instruction;Energy conservation;Therapeutic activities;Patient/family education;Balance training    OT Goals(Current goals can be found in the care plan section) Acute Rehab OT Goals Patient Stated Goal: to play basketball OT Goal Formulation: With patient Time For Goal Achievement: 04/20/16 Potential to Achieve Goals: Good  OT Frequency: Min 2X/week   Barriers to D/C:            Co-evaluation              End of Session Equipment Utilized During Treatment: Engineer, waterolling walker Nurse Communication: Mobility status;Precautions  Activity Tolerance: Patient tolerated treatment well Patient left: in chair;with call bell/phone within reach;with chair alarm set  OT Visit Diagnosis: Muscle weakness (generalized) (M62.81)                ADL either performed or assessed with clinical judgement  Time: 1200-1222 OT Time Calculation (min): 22 min Charges:  OT General Charges $OT Visit: 1 Procedure OT Evaluation $OT Eval Moderate Complexity: 1 Procedure G-Codes: OT G-codes **NOT FOR INPATIENT CLASS** Functional Assessment Tool Used: AM-PAC 6 Clicks Daily Activity    Misty [name]   OTR/L Pager: [phone] Office: [phone] .   Misty [name] 04/06/2016, 3:29 PM

## 2016-04-06 NOTE — Consult Note (Signed)
[facility] Face-to-Face Psychiatry Consult   Reason for Consult:  Lithium toxicity Referring Physician:  Dr. Cruzita Lederer Patient Identification: Misty [name] MRN:  [id number] Principal Diagnosis: Lithium toxicity Diagnosis:   Patient Active Problem List   Diagnosis Date Noted  . Lithium toxicity [T56.891A] 04/05/2016  . Allergic rhinitis [J30.9] 10/08/2014  . GERD (gastroesophageal reflux disease) [K21.9] 10/08/2014  . H/O ASTHMA [J45.909] 10/08/2014  . INTERMITTENT URTICARIA [Z87.2] 10/08/2014  . GAD (generalized anxiety disorder) [F41.1] 07/19/2012  . ODD (oppositional defiant disorder) [F91.3] 07/19/2012  . 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 MDD (major depressive disorder), recurrent episode, moderate ([place]) [F33.1] 03/01/2012    Total Time spent with patient: 1 hour  Subjective:   Misty [name] is a 21 y.o. female patient admitted with dizziness, weakness, cough, tremor, and vomiting/diarrhea.  HPI:  Misty [name] is a 21 year old Caucasian female, seen, chart reviewed for this face-to-face psychiatric consultation and evaluation of lithium toxicity. Patient reportedly presented with increased symptoms of dizziness, generalized weakness, cough, tremors, vomiting/diarrhea. Review of patient lithium level was increased extremely high on admission at this time still high at 1.69. Patient reportedly suffering with chronic mental illness since she was a childhood and had a multiple acute psychiatric hospitalization during [facility] and her recent admission was a [facility] about a year ago. Patient has been managed by ACT team and also Dr. Redmond Pulling make some changes with her medications. Patient stated she has been confused and she does not know what medications and how she is supposed to be taking at home. Patient reported she has been diagnosed with bipolar disorder and also borderline personality disorder in the past. Patient is willing to make medication  changes after discussed with the patient mother. We will discontinue her lithium as before patient and patient mother's request as she has been presented with toxic levels due to multiple changes made in the recent past. Patient also agreed to start her medication lamotrigine 50 mg twice daily, gabapentin 400 mg 3 times daily for mood swings and her medication BuSpar and will be reduced to 50 mg 3 times daily and risperidone will be 2 mg at bedtime only to prevent excessive weight gain. Patient denied current suicidal/homicidal ideation but she has a history of chronic suicidality and self-injurious behaviors which is a part of borderline personality disorder.  Medical history: Patient with past medical history significant for obesity, depression, asthma, GERD, essential hypertension. Patient presented to [facility] complaining of dizziness, weakness, cough, tremor, and vomiting/diarrhea. Patient had been taking lithium 1200 mg twice daily when aforementioned symptoms developed. Patient saw an outside physician at an Urgent Care who did not check a lithium level, but recommended cessation of lithium therapy. Patient reports stopping lithium 3 days ago. However, her symptoms persisted and she sought medical attention at [facility]. No alleviating or exacerbating factors for patient's symptoms. She states she normally does not have dizziness or weakness. No recent antibiotic use, though patient states she was recently prescribed fluconazole for "yeast infection in my throat." Patient denies any sick contacts. Patient denies chest pain, fever, chills, or skin breakdown. She states she is otherwise been in her normal state of health.  OSH Course:  Patient was hemodynamically stable at home outside hospital, though she had persistent sinus tachycardia. Lab work was notable for white blood cell count 19, hemoglobin 13.2, BUN 17, creatinine 0.9. Lithium level was 2.20. Troponin negative. Urinalysis  negative. Urine trashing negative. Patient was given 2 L bolus  normal saline and transferred to [facility] for further evaluation  Past Psychiatric History: Patient has been diagnosed with bipolar disorder, most recent hospitalization was [facility] about a year ago and been followed by ACT team and Dr. Redmond Pulling. Patient also has been diagnosed with borderline personality disorder and has chronic self-injurious behaviors.  Risk to Self: Is patient at risk for suicide?: No Risk to Others:   Prior Inpatient Therapy:   Prior Outpatient Therapy:    Past Medical History:  Past Medical History:  Diagnosis Date  . Allergic rhinitis   . Anxiety   . Depression   . GERD (gastroesophageal reflux disease)   . HTN (hypertension) 07/13/12  . IBS (irritable bowel syndrome)   . Obesity   . OSA on CPAP   . Psoriasis     Past Surgical History:  Procedure Laterality Date  . ADENOIDECTOMY    . SINOSCOPY    . TONSILLECTOMY AND ADENOIDECTOMY  Age 20   Deviated septum corrected at same time   Family History:  Family History  Problem Relation Age of Onset  . Depression Mother   . Allergic rhinitis Mother   . Depression Maternal Grandmother   . Diabetes Maternal Grandmother   . Hypertension Maternal Grandmother   . Bipolar disorder Father   . Drug abuse Father   . Hypertension Father    Family Psychiatric  History: Noncontributory Social History:  History  Alcohol Use No     History  Drug Use No    Social History   Social History  . Marital status: Single    Spouse name: N/A  . Number of children: N/A  . Years of education: N/A   Occupational History  . STUDENT Minor    11th grade [employer]   Social History Main Topics  . Smoking status: Passive Smoke Exposure - Never Smoker  . Smokeless tobacco: Never Used  . Alcohol use No  . Drug use: No  . Sexual activity: No   Other Topics Concern  . Not on file   Social History Narrative  . No narrative on file    Additional Social History:    Allergies:   Allergies  Allergen Reactions  . Ultram [Tramadol] Rash    Pt reports itching as well  . Adhesive [Tape] Rash    Labs:  Results for orders placed or performed during the hospital encounter of 04/04/16 (from the past 48 hour(s))  MRSA PCR Screening     Status: None   Collection Time: 04/05/16 12:02 AM  Result Value Ref Range   MRSA by PCR NEGATIVE NEGATIVE    Comment:        The GeneXpert MRSA Assay (FDA approved for NASAL specimens only), is one component of a comprehensive MRSA colonization surveillance program. It is not intended to diagnose MRSA infection nor to guide or monitor treatment for MRSA infections.   Urinalysis, Routine w reflex microscopic     Status: Abnormal   Collection Time: 04/05/16 12:13 AM  Result Value Ref Range   Color, Urine YELLOW YELLOW   APPearance HAZY (A) CLEAR   Specific Gravity, Urine 1.005 1.005 - 1.030   pH 7.0 5.0 - 8.0   Glucose, UA NEGATIVE NEGATIVE mg/dL   Hgb urine dipstick NEGATIVE NEGATIVE   Bilirubin Urine NEGATIVE NEGATIVE   Ketones, ur NEGATIVE NEGATIVE mg/dL   Protein, ur NEGATIVE NEGATIVE mg/dL   Nitrite NEGATIVE NEGATIVE   Leukocytes, UA SMALL (A) NEGATIVE   RBC / HPF  0-5 0 - 5 RBC/hpf   WBC, UA 6-30 0 - 5 WBC/hpf   Bacteria, UA MANY (A) NONE SEEN   Squamous Epithelial / LPF 0-5 (A) NONE SEEN  Protime-INR     Status: None   Collection Time: 04/05/16 12:20 AM  Result Value Ref Range   Prothrombin Time 14.2 11.4 - 15.2 seconds   INR 1.10   Comprehensive metabolic panel     Status: Abnormal   Collection Time: 04/05/16 12:20 AM  Result Value Ref Range   Sodium 138 135 - 145 mmol/L   Potassium 3.6 3.5 - 5.1 mmol/L   Chloride 107 101 - 111 mmol/L   CO2 21 (L) 22 - 32 mmol/L   Glucose, Bld 105 (H) 65 - 99 mg/dL   BUN 10 6 - 20 mg/dL   Creatinine, Ser 0.88 0.44 - 1.00 mg/dL   Calcium 9.6 8.9 - 10.3 mg/dL   Total Protein 6.3 (L) 6.5 - 8.1 g/dL   Albumin 3.6 3.5 - 5.0  g/dL   AST 29 15 - 41 U/L   ALT 42 14 - 54 U/L   Alkaline Phosphatase 121 38 - 126 U/L   Total Bilirubin 0.5 0.3 - 1.2 mg/dL   GFR calc non Af Amer >60 >60 mL/min   GFR calc Af Amer >60 >60 mL/min    Comment: (NOTE) The eGFR has been calculated using the CKD EPI equation. This calculation has not been validated in all clinical situations. eGFR's persistently <60 mL/min signify possible Chronic Kidney Disease.    Anion gap 10 5 - 15  CBC with Differential/Platelet     Status: Abnormal   Collection Time: 04/05/16 12:20 AM  Result Value Ref Range   WBC 14.8 (H) 4.0 - 10.5 K/uL   RBC 4.35 3.87 - 5.11 MIL/uL   Hemoglobin 12.2 12.0 - 15.0 g/dL   HCT 36.6 36.0 - 46.0 %   MCV 84.1 78.0 - 100.0 fL   MCH 28.0 26.0 - 34.0 pg   MCHC 33.3 30.0 - 36.0 g/dL   RDW 13.9 11.5 - 15.5 %   Platelets 224 150 - 400 K/uL   Neutrophils Relative % 71 %   Neutro Abs 10.8 (H) 1.7 - 7.7 K/uL   Lymphocytes Relative 17 %   Lymphs Abs 2.6 0.7 - 4.0 K/uL   Monocytes Relative 9 %   Monocytes Absolute 1.4 (H) 0.1 - 1.0 K/uL   Eosinophils Relative 3 %   Eosinophils Absolute 0.4 0.0 - 0.7 K/uL   Basophils Relative 0 %   Basophils Absolute 0.0 0.0 - 0.1 K/uL  Lactic acid, plasma     Status: Abnormal   Collection Time: 04/05/16 12:20 AM  Result Value Ref Range   Lactic Acid, Venous 2.5 (HH) 0.5 - 1.9 mmol/L    Comment: CRITICAL RESULT CALLED TO, READ BACK BY AND VERIFIED WITH: HITT,L RN 04/05/2016 0106 JORDANS   Lithium level     Status: Abnormal   Collection Time: 04/05/16 12:20 AM  Result Value Ref Range   Lithium Lvl 1.69 (HH) 0.60 - 1.20 mmol/L    Comment: CRITICAL RESULT CALLED TO, READ BACK BY AND VERIFIED WITH: HITT,L RN 04/05/2016 0106 JORDANS   HIV antibody (Routine Testing)     Status: None   Collection Time: 04/05/16 12:20 AM  Result Value Ref Range   HIV Screen 4th Generation wRfx Non Reactive Non Reactive    Comment: (NOTE) Performed At: [facility] [address] [place],  [place] [id number] Evette Doffing  Darrick Penna MD FW:[id number]   Magnesium     Status: None   Collection Time: 04/05/16 12:20 AM  Result Value Ref Range   Magnesium 2.1 1.7 - 2.4 mg/dL  Phosphorus     Status: Abnormal   Collection Time: 04/05/16 12:20 AM  Result Value Ref Range   Phosphorus 1.7 (L) 2.5 - 4.6 mg/dL  Influenza panel by PCR (type A & B)     Status: None   Collection Time: 04/05/16  1:36 AM  Result Value Ref Range   Influenza A By PCR NEGATIVE NEGATIVE   Influenza B By PCR NEGATIVE NEGATIVE    Comment: (NOTE) The Xpert Xpress Flu assay is intended as an aid in the diagnosis of  influenza and should not be used as a sole basis for treatment.  This  assay is FDA approved for nasopharyngeal swab specimens only. Nasal  washings and aspirates are unacceptable for Xpert Xpress Flu testing.   Respiratory Panel by PCR     Status: None   Collection Time: 04/05/16  1:36 AM  Result Value Ref Range   Adenovirus NOT DETECTED NOT DETECTED   Coronavirus 229E NOT DETECTED NOT DETECTED   Coronavirus HKU1 NOT DETECTED NOT DETECTED   Coronavirus NL63 NOT DETECTED NOT DETECTED   Coronavirus OC43 NOT DETECTED NOT DETECTED   Metapneumovirus NOT DETECTED NOT DETECTED   Rhinovirus / Enterovirus NOT DETECTED NOT DETECTED   Influenza A NOT DETECTED NOT DETECTED   Influenza B NOT DETECTED NOT DETECTED   Parainfluenza Virus 1 NOT DETECTED NOT DETECTED   Parainfluenza Virus 2 NOT DETECTED NOT DETECTED   Parainfluenza Virus 3 NOT DETECTED NOT DETECTED   Parainfluenza Virus 4 NOT DETECTED NOT DETECTED   Respiratory Syncytial Virus NOT DETECTED NOT DETECTED   Bordetella pertussis NOT DETECTED NOT DETECTED   Chlamydophila pneumoniae NOT DETECTED NOT DETECTED   Mycoplasma pneumoniae NOT DETECTED NOT DETECTED  Lactic acid, plasma     Status: None   Collection Time: 04/05/16  6:13 AM  Result Value Ref Range   Lactic Acid, Venous 1.2 0.5 - 1.9 mmol/L  Basic metabolic panel     Status: Abnormal   Collection  Time: 04/06/16  7:03 AM  Result Value Ref Range   Sodium 139 135 - 145 mmol/L   Potassium 3.5 3.5 - 5.1 mmol/L   Chloride 111 101 - 111 mmol/L   CO2 20 (L) 22 - 32 mmol/L   Glucose, Bld 111 (H) 65 - 99 mg/dL   BUN 9 6 - 20 mg/dL   Creatinine, Ser 0.81 0.44 - 1.00 mg/dL   Calcium 9.7 8.9 - 10.3 mg/dL   GFR calc non Af Amer >60 >60 mL/min   GFR calc Af Amer >60 >60 mL/min    Comment: (NOTE) The eGFR has been calculated using the CKD EPI equation. This calculation has not been validated in all clinical situations. eGFR's persistently <60 mL/min signify possible Chronic Kidney Disease.    Anion gap 8 5 - 15  Lithium level     Status: None   Collection Time: 04/06/16  7:03 AM  Result Value Ref Range   Lithium Lvl 0.99 0.60 - 1.20 mmol/L    Current Facility-Administered Medications  Medication Dose Route Frequency Provider Last Rate Last Dose  . 0.9 %  sodium chloride infusion   Intravenous Continuous Cherene Altes, MD 50 mL/hr at 04/05/16 1714    . acetaminophen (TYLENOL) tablet 650 mg  650 mg Oral Q4H PRN Jeryl Columbia,  NP   650 mg at 04/06/16 0017  . albuterol (PROVENTIL) (2.5 MG/3ML) 0.083% nebulizer solution 2.5 mg  2.5 mg Inhalation Q2H PRN Cherene Altes, MD      . buPROPion (WELLBUTRIN XL) 24 hr tablet 300 mg  300 mg Oral Daily Cherene Altes, MD   300 mg at 04/06/16 1040  . busPIRone (BUSPAR) tablet 20 mg  20 mg Oral TID Cherene Altes, MD   20 mg at 04/06/16 1042  . cholecalciferol (VITAMIN D) tablet 2,000 Units  2,000 Units Oral Daily Cherene Altes, MD   2,000 Units at 04/06/16 1041  . dicyclomine (BENTYL) capsule 10 mg  10 mg Oral QID Rexanne Mano, MD   10 mg at 04/06/16 1040  . enoxaparin (LOVENOX) injection 90 mg  90 mg Subcutaneous Q24H Rexanne Mano, MD   90 mg at 04/05/16 1209  . furosemide (LASIX) tablet 40 mg  40 mg Oral Daily Cherene Altes, MD   40 mg at 04/06/16 1042  . metoprolol tartrate (LOPRESSOR) tablet 12.5 mg  12.5 mg Oral BID  Cherene Altes, MD   12.5 mg at 04/06/16 1042  . mometasone-formoterol (DULERA) 200-5 MCG/ACT inhaler 2 puff  2 puff Inhalation BID Rexanne Mano, MD   2 puff at 04/05/16 2116  . multivitamin with minerals tablet 1 tablet  1 tablet Oral Daily Cherene Altes, MD   1 tablet at 04/06/16 1040  . pantoprazole (PROTONIX) EC tablet 40 mg  40 mg Oral Daily Rexanne Mano, MD   40 mg at 04/06/16 1042  . potassium chloride (K-DUR) CR tablet 10 mEq  10 mEq Oral Daily Cherene Altes, MD   10 mEq at 04/06/16 1040  . risperiDONE (RISPERDAL) tablet 2 mg  2 mg Oral QHS Rexanne Mano, MD   2 mg at 04/05/16 2143  . sodium chloride 0.9 % bolus 500 mL  500 mL Intravenous Once Jeryl Columbia, NP        Musculoskeletal: Strength & Muscle Tone: decreased Gait & Station: unable to stand Patient leans: N/A  Psychiatric Specialty Exam: Physical Exam as per history and physical   ROS complaining about multiple physical complaints including dizziness, nausea, vomiting and generalized weakness and presented with to lithium toxicity secondary to recent multiple changes in her medications. No Fever-chills, No Headache, No changes with Vision or hearing, reports vertigo No problems swallowing food or Liquids, No Chest pain, Cough or Shortness of Breath, No Abdominal pain, No Nausea or Vommitting, Bowel movements are regular, No Blood in stool or Urine, No dysuria, No new skin rashes or bruises, No new joints pains-aches,  No new weakness, tingling, numbness in any extremity, No recent weight gain or loss, No polyuria, polydypsia or polyphagia,  A full 10 point Review of Systems was done, except as stated above, all other Review of Systems were negative.  Blood pressure 122/73, pulse 62, temperature 98.3 F (36.8 C), resp. rate 20, height _0  (1.6 m), weight (!) 186.7 kg (411 lb 11.2 oz), SpO2 98 %.Body mass index is 72.93 kg/m.  General Appearance: Guarded. Morbidly obese   Eye Contact:   Fair  Speech:  Slow  Volume:  Decreased  Mood:  Anxious and Depressed  Affect:  Constricted and Depressed  Thought Process:  Coherent and Goal Directed  Orientation:  Full (Time, Place, and Person)  Thought Content:  Rumination  Suicidal Thoughts:  No  Homicidal Thoughts:  No  Memory:  Immediate;  Fair Recent;   Poor Remote;   Fair  Judgement:  Fair  Insight:  Fair  Psychomotor Activity:  Decreased  Concentration:  Concentration: Fair and Attention Span: Fair  Recall:  [employer] of Knowledge:  Good  Language:  Good  Akathisia:  Negative  Handed:  Right  AIMS (if indicated):     Assets:  Communication Skills Desire for Improvement Financial Resources/Insurance Housing Leisure Time Resilience Social Support Transportation  ADL's:  Intact  Cognition:  Impaired,  Mild  Sleep:        Treatment Plan Summary: 21 years old female with the history of bipolar disorder/borderline personality disorder, history of self-injurious behaviors and multiple acute psychiatric hospitalization since she was a child. Patient presented with the multiple physical complaints secondary to lithium toxicity due to recent changes. Patient also have a mild cognitive deficit secondary to polypharmacy.  Diagnosis: Lithium toxicity due to accidental overdose Bipolar disorder most recent episode is unspecified  Borderline personality disorder as reported   Patient has no safety concerns and denies current suicidal ideation and self-injurious behavior Discontinue lithium as presented with the lithium toxicity and not positively responsive Daily contact with patient to assess and evaluate symptoms and progress in treatment and Medication management  Medication recommendation: Start lamotrigine 50 mg twice daily for mood swings, which can be titrated up to 200 mg as clinically required and monitor for the neurological side effect of medication and skin rash,  Gabapentin 400 mg 3 times daily for anxiety   Reduce her BuSpar on to 50 mg 2 times daily for generalized anxiety  Change Risperidone 2 mg at bedtime for hallucinations. Appreciate psychiatric consultation and follow up as clinically required Please contact [phone] or [phone] if needs further assistance   Disposition: Patient does not meet criteria for psychiatric inpatient admission. Supportive therapy provided about ongoing stressors.  Ambrose Finland, MD 04/06/2016 11:02 AM

## 2016-04-06 NOTE — Progress Notes (Signed)
Paged MD. Elvera LennoxGherghe for pain med.

## 2016-04-07 ENCOUNTER — Ambulatory Visit: Payer: Medicaid Other | Admitting: Allergy and Immunology

## 2016-04-07 LAB — BASIC METABOLIC PANEL
ANION GAP: 9 (ref 5–15)
BUN: 13 mg/dL (ref 6–20)
CALCIUM: 9.8 mg/dL (ref 8.9–10.3)
CO2: 20 mmol/L — ABNORMAL LOW (ref 22–32)
Chloride: 107 mmol/L (ref 101–111)
Creatinine, Ser: 0.75 mg/dL (ref 0.44–1.00)
Glucose, Bld: 90 mg/dL (ref 65–99)
Potassium: 4.3 mmol/L (ref 3.5–5.1)
SODIUM: 136 mmol/L (ref 135–145)

## 2016-04-07 LAB — CBC
HCT: 37 % (ref 36.0–46.0)
HEMOGLOBIN: 12.2 g/dL (ref 12.0–15.0)
MCH: 27.5 pg (ref 26.0–34.0)
MCHC: 33 g/dL (ref 30.0–36.0)
MCV: 83.3 fL (ref 78.0–100.0)
Platelets: 228 10*3/uL (ref 150–400)
RBC: 4.44 MIL/uL (ref 3.87–5.11)
RDW: 13.5 % (ref 11.5–15.5)
WBC: 13.3 10*3/uL — ABNORMAL HIGH (ref 4.0–10.5)

## 2016-04-07 MED ORDER — BUPROPION HCL ER (XL) 300 MG PO TB24: 300.0000 mg | ORAL_TABLET | Freq: Three times a day (TID) | ORAL | 0 refills | Status: DC

## 2016-04-07 MED ORDER — LAMOTRIGINE 25 MG PO TABS
50.0000 mg | ORAL_TABLET | Freq: Two times a day (BID) | ORAL | 0 refills | Status: DC
Start: 2016-04-07 — End: 2017-04-26

## 2016-04-07 MED ORDER — BUSPIRONE HCL 15 MG PO TABS: 15.0000 mg | ORAL_TABLET | Freq: Two times a day (BID) | ORAL | Status: DC

## 2016-04-07 MED ORDER — BUSPIRONE HCL 10 MG PO TABS: 15.0000 mg | ORAL_TABLET | Freq: Two times a day (BID) | ORAL | 0 refills | Status: DC

## 2016-04-07 MED ORDER — GABAPENTIN 400 MG PO CAPS: 400.0000 mg | ORAL_CAPSULE | Freq: Three times a day (TID) | ORAL | 0 refills | Status: DC

## 2016-04-07 MED ORDER — BUPROPION HCL ER (XL) 300 MG PO TB24: 300.0000 mg | ORAL_TABLET | Freq: Every day | ORAL | 0 refills | Status: DC

## 2016-04-07 NOTE — Discharge Summary (Addendum)
PATIENT DETAILS Name: Misty [name] Age: 21 y.o. Sex: female Date of Birth: [birth year] MRN: [id number]. Admitting Physician: Hillary Bow, DO VPX:[id number], Lorriane Shire, PA  Admit Date: 04/04/2016 Discharge date: 04/07/2016  Recommendations for Outpatient Follow-up:  1. Follow up with PCP in 1-2 weeks 2. Please obtain BMP/CBC in one week 3. Please ensure follow-up with her primary psychiatrist within 1 week of hospital discharge. 4. Note-no longer on Lithium  Admitted From:  Home   Disposition: Home   Home Health: No  Equipment/Devices: None  Discharge Condition: Stable  CODE STATUS: FULL CODE  Diet recommendation:  Heart Healthy   Brief Summary: See H&P, Labs, Consult and Test reports for all details in brief,Misty [name] is a 21 year old female w/ a history of obesity, depression, asthma, GERD, and HTN who presented to [facility] complaining of dizziness, weakness, cough, tremor, and vomiting w/ diarrhea. Patient had been taking lithium 1200 mg twice daily when symptoms developed. Patient saw an outside physician at an Urgent Care who did not check a lithium level, but recommended cessation of lithium therapy. Patient stopped 3 days prior to her presentation to [facility] [place]. She was hemodynamically stable at the outside hospital, though she had persistent sinus tachycardia. Lab work was notable for white blood cell count 19, hemoglobin 13.2, BUN 17, creatinine 0.9. Lithium level was 2.20. Patient was given 2 L bolus normal saline and transferred to [facility] for further evaluation.  Brief Hospital Course: Lithium Toxicity: Resolved, patient has a has extensive psychiatric history and is on multiple medications. She presented with Lithium level 2.20 at [facility]. Started on IV fluids and Lithium was stopped. Lithium levels have subsequently normalized, and she's had some significant improvement in her symptoms. Psychiatry was consulted, recommendations are  to start Lamictal, continue Neurontin, BuSpar and risperidone. Patient will require close outpatient follow-up with her primary psychiatrist. Note, psychiatrist did not recommend inpatient psych admission.  Bipolar disorder / borderline personality disorder/oppositional defiant disorder: Very complicated psychiatric history, presented with Lithium toxicity-no longer lithium. Subsequently seen by psychiatry-education suggested. See above.  Hypertension: Well controlled. Continue with Lasix and metoprolol.  Asthma: No acute complications. No wheezing -continue with her usual medications.  Deconditioning/weakness: Patient is significantly obese-and at baseline needs assistance from family to ambulate. She was seen by both physical therapy and occupational therapy services and not felt to require any further follow-up.   Morbid Obesity: Body mass index is 72.93 kg/m.Counseled extensively about the importance of weight loss.  Procedures/Studies: None  Discharge Diagnoses:  Principal Problem:   Lithium toxicity   Discharge Instructions:  Activity:  As tolerated with Full fall precautions use walker/cane & assistance as needed   Discharge Instructions    Diet - low sodium heart healthy    Complete by:  As directed    Discharge instructions    Complete by:  As directed    Follow with Primary MD  YOUNG, [name], PA within 1 week of hospital discharge  Follow with primary psychiatrist within 1 week of hospital discharge  Please get a complete blood count and chemistry panel checked by your Primary MD at your next visit, and again as instructed by your Primary MD.  Get Medicines reviewed and adjusted: Please take all your medications with you for your next visit with your Primary MD  Laboratory/radiological data: Please request your Primary MD to go over all hospital tests and procedure/radiological results at the follow up, please ask your Primary MD to get all  Hospital records  sent to his/her office.  In some cases, they will be blood work, cultures and biopsy results pending at the time of your discharge. Please request that your primary care M.D. follows up on these results.  Also Note the following: If you experience worsening of your admission symptoms, develop shortness of breath, life threatening emergency, suicidal or homicidal thoughts you must seek medical attention immediately by calling 911 or calling your MD immediately  if symptoms less severe.  You must read complete instructions/literature along with all the possible adverse reactions/side effects for all the Medicines you take and that have been prescribed to you. Take any new Medicines after you have completely understood and accpet all the possible adverse reactions/side effects.   Do not drive when taking Pain medications or sleeping medications (Benzodaizepines)  Do not take more than prescribed Pain, Sleep and Anxiety Medications. It is not advisable to combine anxiety,sleep and pain medications without talking with your primary care practitioner  Special Instructions: If you have smoked or chewed Tobacco  in the last 2 yrs please stop smoking, stop any regular Alcohol  and or any Recreational drug use.  Wear Seat belts while driving.  Please note: You were cared for by a hospitalist during your hospital stay. Once you are discharged, your primary care physician will handle any further medical issues. Please note that NO REFILLS for any discharge medications will be authorized once you are discharged, as it is imperative that you return to your primary care physician (or establish a relationship with a primary care physician if you do not have one) for your post hospital discharge needs so that they can reassess your need for medications and monitor your lab values.   Increase activity slowly    Complete by:  As directed      Allergies as of 04/07/2016      Reactions   Ultram [tramadol] Rash    Pt reports itching as well   Adhesive [tape] Rash      Medication List    STOP taking these medications   fluconazole 150 MG tablet Commonly known as:  DIFLUCAN   hydrOXYzine 50 MG tablet Commonly known as:  ATARAX/VISTARIL   lisinopril 10 MG tablet Commonly known as:  PRINIVIL,ZESTRIL   lithium carbonate 300 MG CR tablet Commonly known as:  LITHOBID     TAKE these medications   albuterol 108 (90 Base) MCG/ACT inhaler Commonly known as:  PROVENTIL HFA Inhale two puffs every four to six hours as needed for cough or wheeze.   azelastine 0.1 % nasal spray Commonly known as:  ASTELIN Can use one to two sprays in each nostril one to two times daily as needed.   beclomethasone 80 MCG/ACT inhaler Commonly known as:  QVAR Inhale three puffs three times daily during asthma flare-up.  Rinse, gargle, and spit after use.   buPROPion 300 MG 24 hr tablet Commonly known as:  WELLBUTRIN XL Take 1 tablet (300 mg total) by mouth daily. What changed:  medication strength  how much to take  when to take this   busPIRone 10 MG tablet Commonly known as:  BUSPAR Take 1.5 tablets (15 mg total) by mouth 2 (two) times daily. What changed:  how much to take  when to take this   ferrous sulfate 325 (65 FE) MG tablet See admin instructions.   fexofenadine 180 MG tablet Commonly known as:  ALLEGRA TAKE ONE TABLET BY MOUTH DAILY   fluticasone 50 MCG/ACT nasal spray  Commonly known as:  FLONASE Use one spray in each nostril twice a day.   furosemide 40 MG tablet Commonly known as:  LASIX Take 40 mg by mouth daily.   gabapentin 400 MG capsule Commonly known as:  NEURONTIN Take 1 capsule (400 mg total) by mouth 3 (three) times daily.   lamoTRIgine 25 MG tablet Commonly known as:  LAMICTAL Take 2 tablets (50 mg total) by mouth 2 (two) times daily.   metoprolol tartrate 25 MG tablet Commonly known as:  LOPRESSOR Take 25 mg by mouth 2 (two) times daily.     mometasone-formoterol 200-5 MCG/ACT Aero Commonly known as:  DULERA Inhale two puffs twice a day to prevent cough or wheeze.  Rinse, gargle, and spit after use.   multivitamin with minerals Tabs tablet Take 1 tablet by mouth daily.   omeprazole 40 MG capsule Commonly known as:  PRILOSEC Take one capsule by mouth twice a day.   potassium chloride 10 MEQ tablet Commonly known as:  K-DUR Take 10 mEq by mouth daily.   prazosin 1 MG capsule Commonly known as:  MINIPRESS Take 2 mg by mouth at bedtime.   ranitidine 300 MG tablet Commonly known as:  ZANTAC Take 1 tablet (300 mg total) by mouth at bedtime.   risperiDONE 2 MG tablet Commonly known as:  RISPERDAL Take 2 mg by mouth at bedtime.   VIBERZI 100 MG Tabs Generic drug:  Eluxadoline Take 1 tablet by mouth 2 (two) times daily.   Vitamin D (Ergocalciferol) 50000 units Caps capsule Commonly known as:  DRISDOL TAKE ONE CAPSULE BY MOUTH ONCE WEEKLY FOR EIGHT WEEKS   Vitamin D 2000 units tablet Take 2,000 Units by mouth daily.   zonisamide 100 MG capsule Commonly known as:  ZONEGRAN TAKE ONE CAPSULE BY MOUTH AT BEDTIME FOR 2 WEEKS, THEN TAKE TWO CAPSULES AT BEDTIME            Durable Medical Equipment        Start     Ordered   04/07/16 [phone]  For home use only DME 4 wheeled rolling walker with seat  Once    Comments:  bariatric rollator  Question:  Patient needs a walker to treat with the following condition  Answer:  Lithium intoxication   04/07/16 0835   04/07/16 0754  For home use only DME 4 wheeled rolling walker with seat  Once    Question:  Patient needs a walker to treat with the following condition  Answer:  Physical deconditioning   04/07/16 0753     Follow-up Information    Inc[facility] - [facility]. Go to.   Why:  Bariatric rollator is to be picked up by patient at the [facility] store : [address] [place], [place], [place] [address] Contact information: [address] [place]  [address] [phone]        [place], Lorriane Shire, [place]. Schedule an appointment as soon as possible for a visit in 1 week(s).   Specialty:  Physician Assistant Contact information: [address] [place] [address] [phone]        Primary psychiatrist. Schedule an appointment as soon as possible for a visit in 1 week(s).        [facility] Follow up on 04/08/2016.   Why:  home appointment scheduled with Dr. Samuel Germany at 10am on 04/08/2016 Contact information:      [phone] (office)    [phone] (fax)  Allergies  Allergen Reactions  . Ultram [Tramadol] Rash    Pt reports itching as well  . Adhesive [Tape] Rash    Consultations:   psychiatry  Other Procedures/Studies: Dg Chest Port 1 View  Result Date: 04/05/2016 CLINICAL DATA:  Shortness of breath. EXAM: PORTABLE CHEST 1 VIEW COMPARISON:  Radiograph of April 04, 2016. FINDINGS: The heart size and mediastinal contours are within normal limits. Both lungs are clear. No pneumothorax or pleural effusion is noted. The visualized skeletal structures are unremarkable. IMPRESSION: No acute cardiopulmonary abnormality seen. Electronically Signed   By: Lupita Raider, M.D.   On: 04/05/2016 07:24     TODAY-DAY OF DISCHARGE:  Subjective:   Lachrisha Kawabata today has no headache,no chest abdominal pain,no new weakness tingling or numbness, feels much better wants to go home today.   Objective:   Blood pressure 140/76, pulse 100, temperature 97.3 F (36.3 C), temperature source Oral, resp. rate (!) 30, height 5\' 3"  (1.6 m), weight (!) 186.7 kg (411 lb 11.2 oz), SpO2 100 %.  Intake/Output Summary (Last 24 hours) at 04/07/16 1650 Last data filed at 04/07/16 1319  Gross per 24 hour  Intake              600 ml  Output             3000 ml  Net            -2400 ml   Filed Weights   04/04/16 2341  Weight: (!) 186.7 kg (411 lb 11.2 oz)    Exam: Awake Alert, Oriented *3, No new F.N deficits,  Normal affect [place].AT,PERRAL Supple Neck,No JVD, No cervical lymphadenopathy appriciated.  Symmetrical Chest wall movement, Good air movement bilaterally, CTAB RRR,No Gallops,Rubs or new Murmurs, No Parasternal Heave +ve B.Sounds, Abd Soft, Non tender, No organomegaly appriciated, No rebound -guarding or rigidity. No Cyanosis, Clubbing or edema, No new Rash or bruise   PERTINENT RADIOLOGIC STUDIES: Dg Chest Port 1 View  Result Date: 04/05/2016 CLINICAL DATA:  Shortness of breath. EXAM: PORTABLE CHEST 1 VIEW COMPARISON:  Radiograph of April 04, 2016. FINDINGS: The heart size and mediastinal contours are within normal limits. Both lungs are clear. No pneumothorax or pleural effusion is noted. The visualized skeletal structures are unremarkable. IMPRESSION: No acute cardiopulmonary abnormality seen. Electronically Signed   By: Lupita Raider, M.D.   On: 04/05/2016 07:24     PERTINENT LAB RESULTS: CBC:  Recent Labs  04/05/16 0020 04/07/16 0539  WBC 14.8* 13.3*  HGB 12.2 12.2  HCT 36.6 37.0  PLT 224 228   CMET CMP     Component Value Date/Time   NA 136 04/07/2016 0539   K 4.3 04/07/2016 0539   CL 107 04/07/2016 0539   CO2 20 (L) 04/07/2016 0539   GLUCOSE 90 04/07/2016 0539   BUN 13 04/07/2016 0539   CREATININE 0.75 04/07/2016 0539   CALCIUM 9.8 04/07/2016 0539   PROT 6.3 (L) 04/05/2016 0020   ALBUMIN 3.6 04/05/2016 0020   AST 29 04/05/2016 0020   ALT 42 04/05/2016 0020   ALKPHOS 121 04/05/2016 0020   BILITOT 0.5 04/05/2016 0020   GFRNONAA >60 04/07/2016 0539   GFRAA >60 04/07/2016 0539    GFR Estimated Creatinine Clearance: 187.9 mL/min (by C-G formula based on SCr of 0.75 mg/dL). No results for input(s): LIPASE, AMYLASE in the last 72 hours. No results for input(s): CKTOTAL, CKMB, CKMBINDEX, TROPONINI in the last 72 hours. Invalid input(s): POCBNP No results for input(s):  DDIMER in the last 72 hours. No results for input(s): HGBA1C in the last 72 hours. No  results for input(s): CHOL, HDL, LDLCALC, TRIG, CHOLHDL, LDLDIRECT in the last 72 hours. No results for input(s): TSH, T4TOTAL, T3FREE, THYROIDAB in the last 72 hours.  Invalid input(s): FREET3 No results for input(s): VITAMINB12, FOLATE, FERRITIN, TIBC, IRON, RETICCTPCT in the last 72 hours. Coags:  Recent Labs  04/05/16 0020  INR 1.10   Microbiology: Recent Results (from the past 240 hour(s))  MRSA PCR Screening     Status: None   Collection Time: 04/05/16 12:02 AM  Result Value Ref Range Status   MRSA by PCR NEGATIVE NEGATIVE Final    Comment:        The GeneXpert MRSA Assay (FDA approved for NASAL specimens only), is one component of a comprehensive MRSA colonization surveillance program. It is not intended to diagnose MRSA infection nor to guide or monitor treatment for MRSA infections.   Respiratory Panel by PCR     Status: None   Collection Time: 04/05/16  1:36 AM  Result Value Ref Range Status   Adenovirus NOT DETECTED NOT DETECTED Final   Coronavirus 229E NOT DETECTED NOT DETECTED Final   Coronavirus HKU1 NOT DETECTED NOT DETECTED Final   Coronavirus NL63 NOT DETECTED NOT DETECTED Final   Coronavirus OC43 NOT DETECTED NOT DETECTED Final   Metapneumovirus NOT DETECTED NOT DETECTED Final   Rhinovirus / Enterovirus NOT DETECTED NOT DETECTED Final   Influenza A NOT DETECTED NOT DETECTED Final   Influenza B NOT DETECTED NOT DETECTED Final   Parainfluenza Virus 1 NOT DETECTED NOT DETECTED Final   Parainfluenza Virus 2 NOT DETECTED NOT DETECTED Final   Parainfluenza Virus 3 NOT DETECTED NOT DETECTED Final   Parainfluenza Virus 4 NOT DETECTED NOT DETECTED Final   Respiratory Syncytial Virus NOT DETECTED NOT DETECTED Final   Bordetella pertussis NOT DETECTED NOT DETECTED Final   Chlamydophila pneumoniae NOT DETECTED NOT DETECTED Final   Mycoplasma pneumoniae NOT DETECTED NOT DETECTED Final    FURTHER DISCHARGE INSTRUCTIONS:  Get Medicines reviewed and  adjusted: Please take all your medications with you for your next visit with your Primary MD  Laboratory/radiological data: Please request your Primary MD to go over all hospital tests and procedure/radiological results at the follow up, please ask your Primary MD to get all Hospital records sent to his/her office.  In some cases, they will be blood work, cultures and biopsy results pending at the time of your discharge. Please request that your primary care M.D. goes through all the records of your hospital data and follows up on these results.  Also Note the following: If you experience worsening of your admission symptoms, develop shortness of breath, life threatening emergency, suicidal or homicidal thoughts you must seek medical attention immediately by calling 911 or calling your MD immediately  if symptoms less severe.  You must read complete instructions/literature along with all the possible adverse reactions/side effects for all the Medicines you take and that have been prescribed to you. Take any new Medicines after you have completely understood and accpet all the possible adverse reactions/side effects.   Do not drive when taking Pain medications or sleeping medications (Benzodaizepines)  Do not take more than prescribed Pain, Sleep and Anxiety Medications. It is not advisable to combine anxiety,sleep and pain medications without talking with your primary care practitioner  Special Instructions: If you have smoked or chewed Tobacco  in the last 2 yrs please stop smoking, stop  any regular Alcohol  and or any Recreational drug use.  Wear Seat belts while driving.  Please note: You were cared for by a hospitalist during your hospital stay. Once you are discharged, your primary care physician will handle any further medical issues. Please note that NO REFILLS for any discharge medications will be authorized once you are discharged, as it is imperative that you return to your primary  care physician (or establish a relationship with a primary care physician if you do not have one) for your post hospital discharge needs so that they can reassess your need for medications and monitor your lab values.  Total Time spent coordinating discharge including counseling, education and face to face time equals  45 minutes.  SignedJeoffrey Massed 04/07/2016 4:50 PM

## 2016-04-07 NOTE — Progress Notes (Signed)
Physical Therapy Treatment Patient Details Name: Charlot Gouin MRN: [id number] DOB: Feb 28, 1995 Today's Date: 04/07/2016    History of Present Illness Ms. Cai is a 21 year old Caucasian female past medical history significant for obesity, depression, asthma, GERD, essential hypertension. Patient presented to [facility] complaining of dizziness, weakness, cough, tremor, and vomiting/diarrhea. Patient had been taking lithium 1200 mg twice daily when aforementioned symptoms developed.    PT Comments    Patient progressing with walking distance, but needing frequent rest breaks to sit on walker.  Able to negotiate two steps with rail and HHA, but doesn't have a rail at home.  Demonstrated backing up to first step with rollator, but then would need to be able to reach door facing or some other stable support for second step.  Feel follow up HHPT indicated for safety with home entry, walker use in the home and progressing mobility.  Will follow until d/c.    Follow Up Recommendations  No PT follow up     Equipment Recommendations  Other (comment) (bariatric rollator)    Recommendations for Other Services       Precautions / Restrictions Precautions Precautions: Fall Precaution Comments: reports h/o falls, but none recent    Mobility  Bed Mobility               General bed mobility comments: Pt OOB in chair upon arrival.  Transfers Overall transfer level: Needs assistance   Transfers: Sit to/from Stand Sit to Stand: Supervision            Ambulation/Gait Ambulation/Gait assistance: Min guard;Supervision Ambulation Distance (Feet): 80 Feet (x3 with three seated rest breaks on rollator) Assistive device: 4-wheeled walker Gait Pattern/deviations: Step-through pattern;Decreased stride length;Shuffle;Wide base of support     General Gait Details: shuffling and slow with rollator, but able to be S mainly, cues for safety with locking walker, positioning for turning  to sit for rest breaks,  Noted HR up to 126 with ambulation today   Stairs Stairs: Yes   Stair Management: One rail Right;Forwards;Step to pattern Number of Stairs: 2 General stair comments: cues for technique, assist for safety and lifting help up the steps.  Discussed options for how to convert to her stairs for home entry  Wheelchair Mobility    Modified Rankin (Stroke Patients Only)       Balance Overall balance assessment: Needs assistance   Sitting balance-Leahy Scale: Good       Standing balance-Leahy Scale: Fair                      Cognition Arousal/Alertness: Awake/alert Behavior During Therapy: Flat affect Overall Cognitive Status: Within Functional Limits for tasks assessed                      Exercises      General Comments        Pertinent Vitals/Pain Pain Assessment: Faces Faces Pain Scale: Hurts little more Pain Location: bil LEs Pain Descriptors / Indicators: Aching;Sore Pain Intervention(s): Monitored during session;Repositioned    Home Living                      Prior Function            PT Goals (current goals can now be found in the care plan section) Progress towards PT goals: Progressing toward goals    Frequency    Min 3X/week      PT Plan Current  plan remains appropriate    Co-evaluation             End of Session   Activity Tolerance: Patient tolerated treatment well Patient left: in chair;with call bell/phone within reach   PT Visit Diagnosis: Unsteadiness on feet (R26.81)     Time: [id number] PT Time Calculation (min) (ACUTE ONLY): 25 min  Charges:  $Gait Training: 23-37 mins                    G CodesElray Mcgregor:       Cynthia Wynn 04/07/2016, 12:15 PM  Sheran Lawlessyndi Wynn, PT [phone] 04/07/2016'

## 2016-04-07 NOTE — Progress Notes (Signed)
CM informed  per MD pt's ACT team needs to see pt prior to d/c. [facility] @ [phone] and spoke with Larita FifeLynn, Public house managerrogram Assistant. CM made Larita FifeLynn aware of pt's  d/c for today. Larita FifeLynn informed CM pt will be seen @ home by Dr. Samuel GermanyGage  on tomorrow @ 10am. CM to fax d/c summary to [facility] @ [phone], ATTN: Larita FifeLynn. CM made hospital MD aware. Gae Gallopngela Caran Storck RN,BSN,CM [phone]

## 2016-04-07 NOTE — Progress Notes (Signed)
Occupational Therapy Treatment Patient Details Name: Nou Chard MRN: [id number] DOB: 06-18-1995 Today's Date: 04/07/2016    History of present illness Ms. Cousin is a 21 year old Caucasian female past medical history significant for obesity, depression, asthma, GERD, essential hypertension. Patient presented to [facility] complaining of dizziness, weakness, cough, tremor, and vomiting/diarrhea. Patient had been taking lithium 1200 mg twice daily when aforementioned symptoms developed.   OT comments  Educated pt on use of AE for increased independence with LB ADL; pt able to return demo use of AE with set up and supervision. AE provided to pt for home use. D/c plan remains appropriate. No further acute OT needs identified; signing off at this time. Please re-consult if needs change.    Follow Up Recommendations  No OT follow up    Equipment Recommendations  Other (comment) (AE for LB)    Recommendations for Other Services      Precautions / Restrictions Precautions Precautions: Fall Precaution Comments: reports h/o falls, but none recent       Mobility Bed Mobility               General bed mobility comments: Pt OOB in chair upon arrival.  Transfers                      Balance                                   ADL Overall ADL's : Needs assistance/impaired             Lower Body Bathing: Set up;Supervison/ safety;With adaptive equipment Lower Body Bathing Details (indicate cue type and reason): Educated on use of long handled sponge for increased independence with LB bathing; pt verbalized understanding.     Lower Body Dressing: Set up;With adaptive equipment;Supervision/safety Lower Body Dressing Details (indicate cue type and reason): Educated pt on use of reacher, long handled shoe horn, and sock aide for increased independence with LB dressing. Pt able to return demo use of reacher and sock aide               General ADL  Comments: Pt without questions or concerns regarding use of AE; provided AE for pt for home use.      Vision                     Perception     Praxis      Cognition   Behavior During Therapy: Flat affect Overall Cognitive Status: Within Functional Limits for tasks assessed                         Exercises     Shoulder Instructions       General Comments      Pertinent Vitals/ Pain       Pain Assessment: Faces Faces Pain Scale: Hurts little more Pain Location: bil LEs Pain Descriptors / Indicators: Aching;Sore Pain Intervention(s): Monitored during session;Limited activity within patient's tolerance  Home Living                                          Prior Functioning/Environment              Frequency  Progress Toward Goals  OT Goals(current goals can now be found in the care plan section)  Progress towards OT goals: Goals met/education completed, patient discharged from OT  Acute Rehab OT Goals OT Goal Formulation: All assessment and education complete, DC therapy  Plan Discharge plan remains appropriate    Co-evaluation                 End of Session Equipment Utilized During Treatment: Other (comment) (AE)  OT Visit Diagnosis: Muscle weakness (generalized) (M62.81)   Activity Tolerance Patient tolerated treatment well   Patient Left in chair;with call bell/phone within reach;with chair alarm set   Nurse Communication          Time: [phone] OT Time Calculation (min): 10 min  Charges: OT General Charges $OT Visit: 1 Procedure OT Treatments $Self Care/Home Management : 8-22 mins  Vishaal Strollo A. Ulice Brilliant, M.S., OTR/L Pager: [place] 04/07/2016, 9:13 AM

## 2016-04-09 ENCOUNTER — Ambulatory Visit: Payer: Medicaid Other | Admitting: Allergy and Immunology

## 2016-04-09 NOTE — Addendum Note (Signed)
Addended by: Berna BueWHITAKER, Aleyza Salmi L on: 04/09/2016 02:36 PM   Modules accepted: Orders

## 2016-04-13 ENCOUNTER — Other Ambulatory Visit: Payer: Self-pay | Admitting: Allergy and Immunology

## 2016-04-22 ENCOUNTER — Other Ambulatory Visit: Payer: Self-pay | Admitting: *Deleted

## 2016-04-22 MED ORDER — EPINEPHRINE 0.3 MG/0.3ML IJ SOAJ: INTRAMUSCULAR | 3 refills | Status: DC

## 2016-05-17 ENCOUNTER — Telehealth: Payer: Self-pay | Admitting: *Deleted

## 2016-05-17 NOTE — Telephone Encounter (Signed)
Per Dr Lucie Leather advised patient that barium swallow done at [facility] showed reflux on exam

## 2016-05-31 ENCOUNTER — Ambulatory Visit: Payer: Self-pay | Admitting: Allergy and Immunology

## 2016-06-02 ENCOUNTER — Ambulatory Visit: Payer: Medicaid Other | Admitting: Allergy and Immunology

## 2016-06-08 ENCOUNTER — Other Ambulatory Visit: Payer: Self-pay | Admitting: Allergy and Immunology

## 2016-06-11 ENCOUNTER — Ambulatory Visit: Payer: Self-pay | Admitting: Allergy and Immunology

## 2016-06-21 ENCOUNTER — Ambulatory Visit: Payer: Self-pay | Admitting: Allergy and Immunology

## 2016-07-02 ENCOUNTER — Ambulatory Visit: Payer: Self-pay | Admitting: Allergy and Immunology

## 2016-07-16 ENCOUNTER — Other Ambulatory Visit: Payer: Self-pay | Admitting: Allergy and Immunology

## 2016-07-16 ENCOUNTER — Other Ambulatory Visit: Payer: Self-pay | Admitting: *Deleted

## 2016-07-16 MED ORDER — OLOPATADINE HCL 0.2 % OP SOLN: 1.0000 [drp] | OPHTHALMIC | 5 refills | Status: DC

## 2016-07-16 MED ORDER — AZELASTINE HCL 0.05 % OP SOLN: 1.0000 [drp] | Freq: Two times a day (BID) | OPHTHALMIC | 3 refills | Status: DC

## 2016-07-16 NOTE — Telephone Encounter (Signed)
RX changed and sent. 

## 2016-07-16 NOTE — Telephone Encounter (Signed)
[facility] need to replace her current eye drop prescription to  PATADAY.  The insurance will not cover all the eye drops they tried to send in.

## 2016-07-26 ENCOUNTER — Ambulatory Visit: Payer: Medicaid Other | Admitting: Allergy and Immunology

## 2016-08-02 ENCOUNTER — Ambulatory Visit: Payer: Medicaid Other | Admitting: Allergy and Immunology

## 2016-08-23 ENCOUNTER — Encounter: Payer: Self-pay | Admitting: Allergy and Immunology

## 2016-08-23 ENCOUNTER — Ambulatory Visit (INDEPENDENT_AMBULATORY_CARE_PROVIDER_SITE_OTHER): Payer: Medicaid Other | Admitting: Allergy and Immunology

## 2016-08-23 VITALS — BP 146/80 | HR 88 | Resp 20

## 2016-08-23 DIAGNOSIS — K219 Gastro-esophageal reflux disease without esophagitis: Secondary | ICD-10-CM

## 2016-08-23 DIAGNOSIS — Z7722 Contact with and (suspected) exposure to environmental tobacco smoke (acute) (chronic): Secondary | ICD-10-CM

## 2016-08-23 DIAGNOSIS — D721 Eosinophilia, unspecified: Secondary | ICD-10-CM

## 2016-08-23 DIAGNOSIS — J3089 Other allergic rhinitis: Secondary | ICD-10-CM

## 2016-08-23 DIAGNOSIS — J4540 Moderate persistent asthma, uncomplicated: Secondary | ICD-10-CM

## 2016-08-23 MED ORDER — FLUTICASONE PROPIONATE HFA 110 MCG/ACT IN AERO: INHALATION_SPRAY | RESPIRATORY_TRACT | 5 refills | Status: DC

## 2016-08-23 NOTE — Patient Instructions (Addendum)
  1. Continue Dulera 200 two inhalations two times per day with spacer.  2. "Action Plan" for asthma flare up:   A. Add FLOVENT 110 (QVAR)- 3 [facility]ations [facility]  2. Continue Prilosec 40 mg twice a day and Ranitidine 300mg  one time per day in the evening  3. Continue Nasonex one spray each nostril 1 - 3 times per week. Aim away from septum.  4. Continue Allegra 180 one tablet one time per day.    5. If needed:   A. nasal saline spray multiple times a day  B. Azelastine 1-2 sprays each nostril one-2 times a day  C. Proventil HFA 2 puffs or albuterol nebulization every 4-6 hours  6. GI evaluation for upper endoscopy. Eosinophilic esophagitis?  7. No tobacco smoke exposure inside the house or car.  8. Return to clinic in 12 weeks or earlier if problem  9. Surgery for reflux? Surgery for weight?  10. Anti-IL 5 biological agent for asthma?

## 2016-08-23 NOTE — Progress Notes (Signed)
Follow-up Note  Referring Provider: Heron Nay, PA Primary Provider: Nancie Neas, PA Date of Office Visit: 08/23/2016  Subjective:   Misty [name] (DOB: 1995/10/10) is a 21 y.o. female [facility] in re-evaluation of the following:  HPI: Misty [name] returns to this clinic in reevaluation of her asthma and allergic rhinitis and reflux and history of urticaria. Her last visit in this clinic was February 2018. At that point in time we made an attempt to address her rather significant reflux disease and swallowing disorder as well as addressing her inflammatory respiratory condition.  Her reflux is still very active with daily regurgitation a few times a day. Her swallowing is definitely a lot better since she introduced ranitidine to her double dose proton pump inhibitor.  Her asthma is still somewhat active with a slight cough and her nasal area is still somewhat congested with some occasional mucus production that is clear to yellow. She does not use a short acting bronchodilator but does consistently use her Dulera. She has not had to activate an action plan for an asthma flare. She is somewhat intolerant of using nasal steroids cause of epistaxis and she is only using her Nasonex about 1 time per week.  Unfortunately, her parents still smoke inside the house.  Allergies as of 08/23/2016      Reactions   Ultram [tramadol] Rash   Pt reports itching as well   Adhesive [tape] Rash      Medication List      albuterol 108 (90 Base) MCG/ACT inhaler Commonly known as:  PROVENTIL HFA Inhale two puffs every four to six hours as needed for cough or wheeze.   azelastine 0.05 % ophthalmic solution Commonly known as:  OPTIVAR Place 1 drop into both eyes 2 (two) times daily.   azelastine 0.1 % nasal spray Commonly known as:  ASTELIN Can use one to two sprays in each nostril one to two times daily as needed.   beclomethasone 80  MCG/ACT inhaler Commonly known as:  QVAR Inhale three puffs three times daily during asthma flare-up.  Rinse, gargle, and spit after use.   buPROPion 300 MG 24 hr tablet Commonly known as:  WELLBUTRIN XL Take 1 tablet (300 mg total) by mouth daily.   busPIRone 10 MG tablet Commonly known as:  BUSPAR Take 15 mg by mouth.   carvedilol 25 MG tablet Commonly known as:  COREG Take 25 mg by mouth 2 (two) times daily with a meal.   divalproex 250 MG 24 hr tablet Commonly known as:  DEPAKOTE ER TAKE ONE TABLET BY MOUTH AT BEDTIME FOR 7 DAYS, THEN TAKE TWO TABLETS AT BEDTIME   EPINEPHrine 0.3 mg/0.3 mL Soaj injection Commonly known as:  EPI-PEN Use as directed for life threatening allergic reaction   ferrous sulfate 325 (65 FE) MG tablet See admin instructions.   fexofenadine 180 MG tablet Commonly known as:  ALLEGRA TAKE ONE TABLET BY MOUTH DAILY   fluticasone 50 MCG/ACT nasal spray Commonly known as:  FLONASE Use one spray in each nostril twice a day.   furosemide 40 MG tablet Commonly known as:  LASIX Take 40 mg by mouth daily.   gabapentin 400 MG capsule Commonly known as:  NEURONTIN Take 1 capsule (400 mg total) by mouth 3 (three) times daily.   ipratropium-albuterol 0.5-2.5 (3) MG/3ML Soln Commonly known as:  DUONEB Take 3 mLs by nebulization every 6 (six) hours as needed.  lamoTRIgine 25 MG tablet Commonly known as:  LAMICTAL Take 2 tablets (50 mg total) by mouth 2 (two) times daily.   lisinopril 10 MG tablet Commonly known as:  PRINIVIL,ZESTRIL Take 10 mg by mouth daily.   METROCREAM 0.75 % cream Generic drug:  metroNIDAZOLE Apply topically 2 (two) times daily. to face   mometasone-formoterol 200-5 MCG/ACT Aero Commonly known as:  DULERA Inhale two puffs twice a day to prevent cough or wheeze.  Rinse, gargle, and spit after use.   multivitamin with minerals Tabs tablet Take 1 tablet by mouth daily.   Olopatadine HCl 0.2 % Soln Commonly known as:   PATADAY Place 1 drop into both eyes 1 day or 1 dose.   omeprazole 40 MG capsule Commonly known as:  PRILOSEC Take one capsule by mouth twice a day.   paliperidone 6 MG 24 hr tablet Commonly known as:  INVEGA Take 6 mg by mouth every morning.   paliperidone 9 MG 24 hr tablet Commonly known as:  INVEGA Take one (1) tablet by mouth at bedtime   potassium chloride 10 MEQ tablet Commonly known as:  K-DUR Take 10 mEq by mouth daily.   prazosin 1 MG capsule Commonly known as:  MINIPRESS Take 2 mg by mouth at bedtime.   progesterone 200 MG capsule Commonly known as:  PROMETRIUM Take 200 mg by mouth at bedtime.   ranitidine 300 MG tablet Commonly known as:  ZANTAC Take 1 tablet (300 mg total) by mouth at bedtime.   TREMFYA 100 MG/ML Sosy Generic drug:  Guselkumab INJECT ONE syringe Subcutaneously every EIGHT WEEKS (AFTER starter doses at WEEKS 0 and 4)   VIBERZI 100 MG Tabs Generic drug:  Eluxadoline Take 1 tablet by mouth 2 (two) times daily.   Vitamin D (Ergocalciferol) 50000 units Caps capsule Commonly known as:  DRISDOL TAKE ONE CAPSULE BY MOUTH ONCE WEEKLY FOR EIGHT WEEKS   Vitamin D 2000 units tablet Take 2,000 Units by mouth daily.   zonisamide 100 MG capsule Commonly known as:  ZONEGRAN TAKE ONE CAPSULE BY MOUTH AT BEDTIME FOR 2 WEEKS, THEN TAKE TWO CAPSULES AT BEDTIME       Past Medical History:  Diagnosis Date  . Allergic rhinitis   . Anxiety   . Depression   . GERD (gastroesophageal reflux disease)   . HTN (hypertension) 07/13/12  . IBS (irritable bowel syndrome)   . Obesity   . OSA on CPAP   . Psoriasis     Past Surgical History:  Procedure Laterality Date  . ADENOIDECTOMY    . SINOSCOPY    . TONSILLECTOMY AND ADENOIDECTOMY  Age 21   Deviated septum corrected at same time    Review of systems negative except as noted in HPI / PMHx or noted below:  Review of Systems  Constitutional: Negative.   HENT: Negative.   Eyes: Negative.     Respiratory: Negative.   Cardiovascular: Negative.   Gastrointestinal: Negative.   Genitourinary: Negative.   Musculoskeletal: Negative.   Skin: Negative.   Neurological: Negative.   Endo/Heme/Allergies: Negative.   Psychiatric/Behavioral: Negative.      Objective:   Vitals:   08/23/16 1738  BP: (!) 146/80  Pulse: 88  Resp: 20          Physical Exam  Constitutional: She is well-developed, well-nourished, and in no distress.  HENT:  Head: Normocephalic.  Right Ear: Tympanic membrane, external ear and ear canal normal.  Left Ear: Tympanic membrane, external ear and ear canal normal.  Nose: Nose normal. No mucosal edema or rhinorrhea.  Mouth/Throat: Uvula is midline, oropharynx is clear and moist and mucous membranes are normal. No oropharyngeal exudate.  Eyes: Conjunctivae are normal.  Neck: Trachea normal. No tracheal tenderness present. No tracheal deviation present. No thyromegaly present.  Cardiovascular: Normal rate, regular rhythm, S1 normal, S2 normal and normal heart sounds.   No murmur heard. Pulmonary/Chest: Breath sounds normal. No stridor. No respiratory distress. She has no wheezes. She has no rales.  Musculoskeletal: She exhibits no edema.  Lymphadenopathy:       Head (right side): No tonsillar adenopathy present.       Head (left side): No tonsillar adenopathy present.    She has no cervical adenopathy.  Neurological: She is alert. Gait normal.  Skin: No rash noted. She is not diaphoretic. No erythema. Nails show no clubbing.  Psychiatric: Mood and affect normal.    Diagnostics: Results of a barium swallow obtained 05/17/2016 identified reflux only  Results of blood tests obtained 04/05/2016 identified a white blood cell count of 14.8 with an absolute eosinophil count of 400, hemoglobin 12.2, platelet 224.  Spirometry was performed and demonstrated an FEV1 of 2.0 at 60 % of predicted.  The patient had an Asthma Control Test with the following  results: ACT Total Score: 16.    Assessment and Plan:   1. Other allergic rhinitis   2. Not well controlled moderate persistent asthma   3. Gastroesophageal reflux disease, esophagitis presence not specified   4. Secondhand smoke exposure   5. Eosinophilia   6. Morbid obesity (HCC)     1. Continue Dulera 200 two inhalations two times per day with spacer.  2. "Action Plan" for asthma flare up:   A. Add FLOVENT 110 (QVAR)- 3 inhalations 3 times per day to [facility]  2. Continue Prilosec 40 mg twice a day and Ranitidine 300mg  one time per day in the evening  3. Continue Nasonex one spray each nostril 1 - 3 times per week. Aim away from septum.  4. Continue Allegra 180 one tablet one time per day.    5. If needed:   A. nasal saline spray multiple times a day  B. Azelastine 1-2 sprays each nostril one-2 times a day  C. Proventil HFA 2 puffs or albuterol nebulization every 4-6 hours  6. GI evaluation for upper endoscopy. Eosinophilic esophagitis?  7. No tobacco smoke exposure inside the house or car.  8. Return to clinic in 12 weeks or earlier if problem  9. Surgery for reflux? Surgery for weight?  10. Anti-IL 5 biological agent for asthma?  Briellah still has active inflammation of her airway. I suspect that her obesity is contributing to her inflammatory state and I had a talk with her today about some of the options for therapy including consideration of possible bariatric surgery. She appears to have very significant reflux as well that would also benefit from a surgical procedure. We need to get her to see a gastroenterologist for an upper endoscopy to see if she does have eosinophilic esophagitis which is somewhat suggested by her eosinophilia and her resistance to very aggressive antireflux treatment. She has seen Dr. Charm Barges in the past for colonoscopy but does not want to go back to see him at this point. She will remain on anti-inflammatory agents for her respiratory tract as  noted above and hopefully her parents will stop smoking tobacco products inside the household. She may be a candidate for an anti-IL 5 biological agent if  things do not improve or if she has any exacerbations over the next 12 weeks or so.  Laurette Schimke, MD Allergy / Immunology [facility]

## 2016-08-25 ENCOUNTER — Ambulatory Visit: Admission: RE | Admit: 2016-08-25 | Discharge: 2016-08-25 | Payer: MEDICAID

## 2016-08-25 DIAGNOSIS — G43709 Chronic migraine without aura, not intractable, without status migrainosus: Principal | ICD-10-CM

## 2016-08-25 MED ORDER — DIVALPROEX ER 250 MG TABLET,EXTENDED RELEASE 24 HR
ORAL_TABLET | Freq: Every day | ORAL | 6 refills | 0 days | Status: CP
Start: 2016-08-25 — End: 2017-08-25

## 2016-09-23 ENCOUNTER — Other Ambulatory Visit: Payer: Self-pay | Admitting: Allergy and Immunology

## 2016-10-08 ENCOUNTER — Ambulatory Visit: Payer: Self-pay | Admitting: Cardiology

## 2016-10-20 ENCOUNTER — Ambulatory Visit: Payer: Self-pay | Admitting: Cardiology

## 2016-11-15 ENCOUNTER — Encounter: Payer: Self-pay | Admitting: Allergy and Immunology

## 2016-11-15 ENCOUNTER — Ambulatory Visit (INDEPENDENT_AMBULATORY_CARE_PROVIDER_SITE_OTHER): Payer: Medicaid Other | Admitting: Allergy and Immunology

## 2016-11-15 VITALS — BP 124/82 | HR 92 | Resp 22

## 2016-11-15 DIAGNOSIS — J4550 Severe persistent asthma, uncomplicated: Secondary | ICD-10-CM

## 2016-11-15 DIAGNOSIS — J3089 Other allergic rhinitis: Secondary | ICD-10-CM

## 2016-11-15 DIAGNOSIS — K224 Dyskinesia of esophagus: Secondary | ICD-10-CM | POA: Diagnosis not present

## 2016-11-15 DIAGNOSIS — K219 Gastro-esophageal reflux disease without esophagitis: Secondary | ICD-10-CM

## 2016-11-15 DIAGNOSIS — Z7722 Contact with and (suspected) exposure to environmental tobacco smoke (acute) (chronic): Secondary | ICD-10-CM

## 2016-11-15 NOTE — Patient Instructions (Addendum)
  1. Continue Dulera 200 two inhalations two times per day with spacer.  2. "Action Plan" for asthma flare up:   A. Add FLOVENT 110 - 3 inhalations 3 times per day to [facility]  2. Continue Prilosec 40 mg twice a day and Ranitidine  one time per day in the evening  3. Continue Nasonex/Flonase one spray each nostril 1 - 3 times per week. Aim away from septum.  4. Continue Allegra 180 one tablet one time per day.    5. If needed:   A. nasal saline spray multiple times a day  B. Azelastine 1-2 sprays each nostril one-2 times a day  C. Proventil HFA 2 puffs or albuterol nebulization every 4-6 hours  6. NEED GI evaluation for upper endoscopy. Eosinophilic esophagitis?  7. No tobacco smoke exposure inside the house or car.  8. Submit for NUCALA administration   9. Obtain fall flu vaccine  10. Return to clinic in 12 weeks or earlier if problem

## 2016-11-15 NOTE — Progress Notes (Signed)
Follow-up Note  Referring Provider: Nancie Neas, PA Primary Provider: Nancie Neas, PA Date of Office Visit: 11/15/2016  Subjective:   Misty [name] (DOB: 04-21-1995) is a 21 y.o. female [facility] in re-evaluation of the following:  HPI: [name] returns to this clinic in reevaluation of multiple issues including asthma and allergic rhinitis and reflux and esophageal dysmotility and a history of urticaria.  Her asthma has been active. She must use a bronchodilator just about every day including nebulized albuterol for wheezing and coughing. Sometime she wakes up at nighttime and uses this device. She continues to use a combination of Dulera and will add in Flovent for 3-4 days just about every week. She has not required a systemic steroid to treat an exacerbation since I have seen her in this clinic. She still has occasional tobacco smoke exposure inside the household.  Her nose is doing relatively well. She rarely if ever uses any Nasonex or Flonase.  Her reflux is active. She still has regurgitation. She still has obstruction to swallowing that lasts about a minute or so and appears to occur a few times a week. She has not seen her GI MD yet. A lot of her regurgitation is better since she introduced ranitidine to her double dose proton pump inhibitor.  Allergies as of 11/15/2016      Reactions   Ultram [tramadol] Rash   Pt reports itching as well   Adhesive [tape] Rash      Medication List      albuterol 108 (90 Base) MCG/ACT inhaler Commonly known as:  PROVENTIL HFA Inhale two puffs every four to six hours as needed for cough or wheeze.   azelastine 0.05 % ophthalmic solution Commonly known as:  OPTIVAR Place 1 drop into both eyes 2 (two) times daily.   azelastine 0.1 % nasal spray Commonly known as:  ASTELIN Can use one to two sprays in each nostril one to two times daily as needed.   buPROPion 300 MG 24 hr  tablet Commonly known as:  WELLBUTRIN XL Take 1 tablet (300 mg total) by mouth daily.   busPIRone 15 MG tablet Commonly known as:  BUSPAR Take 15 mg by mouth 2 (two) times daily.   carvedilol 25 MG tablet Commonly known as:  COREG Take 25 mg by mouth 2 (two) times daily with a meal.   divalproex 250 MG 24 hr tablet Commonly known as:  DEPAKOTE ER TAKE ONE TABLET BY MOUTH AT BEDTIME FOR 7 DAYS, THEN TAKE TWO TABLETS AT BEDTIME   EPINEPHrine 0.3 mg/0.3 mL Soaj injection Commonly known as:  EPI-PEN Use as directed for life threatening allergic reaction   ferrous sulfate 325 (65 FE) MG tablet See admin instructions.   fexofenadine 180 MG tablet Commonly known as:  ALLEGRA TAKE ONE TABLET BY MOUTH DAILY   fluticasone 110 MCG/ACT inhaler Commonly known as:  FLOVENT HFA Inhale three puffs three times daily during flare-up.  Rinse, gargle, and spit after use.   fluticasone 50 MCG/ACT nasal spray Commonly known as:  FLONASE Use one spray in each nostril twice a day.   furosemide 40 MG tablet Commonly known as:  LASIX Take 40 mg by mouth daily.   gabapentin 400 MG capsule Commonly known as:  NEURONTIN Take 1 capsule (400 mg total) by mouth 3 (three) times daily.   ipratropium-albuterol 0.5-2.5 (3) MG/3ML Soln Commonly known as:  DUONEB Take 3 mLs by nebulization  every 6 (six) hours as needed.   lamoTRIgine 25 MG tablet Commonly known as:  LAMICTAL Take 2 tablets (50 mg total) by mouth 2 (two) times daily.   lisinopril 10 MG tablet Commonly known as:  PRINIVIL,ZESTRIL Take 10 mg by mouth daily.   Magnesium 400 MG Tabs Takes one and one-half tablet at lunchtime.   METROCREAM 0.75 % cream Generic drug:  metroNIDAZOLE Apply topically 2 (two) times daily. to face   mometasone-formoterol 200-5 MCG/ACT Aero Commonly known as:  DULERA Inhale two puffs twice a day to prevent cough or wheeze.  Rinse, gargle, and spit after use.   multivitamin with minerals Tabs  tablet Take 1 tablet by mouth daily.   Olopatadine HCl 0.2 % Soln Commonly known as:  PATADAY Place 1 drop into both eyes 1 day or 1 dose.   omeprazole 40 MG capsule Commonly known as:  PRILOSEC Take one capsule by mouth twice a day.   paliperidone 6 MG 24 hr tablet Commonly known as:  INVEGA Take 6 mg by mouth every morning.   paliperidone 9 MG 24 hr tablet Commonly known as:  INVEGA Take one (1) tablet by mouth at bedtime   potassium chloride 10 MEQ tablet Commonly known as:  K-DUR Take 10 mEq by mouth daily.   prazosin 1 MG capsule Commonly known as:  MINIPRESS Take 2 mg by mouth at bedtime.   progesterone 200 MG capsule Commonly known as:  PROMETRIUM Take 200 mg by mouth at bedtime.   ranitidine 300 MG tablet Commonly known as:  ZANTAC Take 1 tablet (300 mg total) by mouth at bedtime.   TREMFYA 100 MG/ML Sosy Generic drug:  Guselkumab INJECT ONE syringe Subcutaneously every EIGHT WEEKS (AFTER starter doses at WEEKS 0 and 4)   VIBERZI 100 MG Tabs Generic drug:  Eluxadoline Take 1 tablet by mouth 2 (two) times daily.   Vitamin D (Ergocalciferol) 50000 units Caps capsule Commonly known as:  DRISDOL TAKE ONE CAPSULE BY MOUTH ONCE WEEKLY FOR EIGHT WEEKS   Vitamin D 2000 units tablet Take 2,000 Units by mouth daily.   zonisamide 100 MG capsule Commonly known as:  ZONEGRAN TAKE ONE CAPSULE BY MOUTH AT BEDTIME FOR 2 WEEKS, THEN TAKE TWO CAPSULES AT BEDTIME       Past Medical History:  Diagnosis Date  . Allergic rhinitis   . Anxiety   . Depression   . GERD (gastroesophageal reflux disease)   . HTN (hypertension) 07/13/12  . IBS (irritable bowel syndrome)   . Obesity   . OSA on CPAP   . Psoriasis     Past Surgical History:  Procedure Laterality Date  . ADENOIDECTOMY    . SINOSCOPY    . TONSILLECTOMY AND ADENOIDECTOMY  Age 36   Deviated septum corrected at same time    Review of systems negative except as noted in HPI / PMHx or noted  below:  Review of Systems  Constitutional: Negative.   HENT: Negative.   Eyes: Negative.   Respiratory: Negative.   Cardiovascular: Negative.   Gastrointestinal: Negative.   Genitourinary: Negative.   Musculoskeletal: Negative.   Skin: Negative.   Neurological: Negative.   Endo/Heme/Allergies: Negative.   Psychiatric/Behavioral: Negative.      Objective:   Vitals:   11/15/16 1532  BP: 124/82  Pulse: 92  Resp: (!) 22          Physical Exam  Constitutional: She is well-developed, well-nourished, and in no distress.  Obese  HENT:  Head: Normocephalic.  Right Ear: Tympanic membrane, external ear and ear canal normal.  Left Ear: Tympanic membrane, external ear and ear canal normal.  Nose: Nose normal. No mucosal edema or rhinorrhea.  Mouth/Throat: Uvula is midline, oropharynx is clear and moist and mucous membranes are normal. No oropharyngeal exudate.  Eyes: Conjunctivae are normal.  Neck: Trachea normal. No tracheal tenderness present. No tracheal deviation present. No thyromegaly present.  Cardiovascular: Normal rate, regular rhythm, S1 normal, S2 normal and normal heart sounds.   No murmur heard. Pulmonary/Chest: Breath sounds normal. No stridor. No respiratory distress. She has no wheezes. She has no rales.  Musculoskeletal: She exhibits no edema.  Lymphadenopathy:       Head (right side): No tonsillar adenopathy present.       Head (left side): No tonsillar adenopathy present.    She has no cervical adenopathy.  Neurological: She is alert. Gait normal.  Skin: No rash noted. She is not diaphoretic. No erythema. Nails show no clubbing.  Psychiatric: Mood and affect normal.    Diagnostics:    Spirometry was performed and demonstrated an FEV1 of 1.41 at 42 % of predicted.  The patient had an Asthma Control Test with the following results: ACT Total Score: 8.    Assessment and Plan:   1. Not well controlled severe persistent asthma   2. Other allergic  rhinitis   3. Secondhand smoke exposure   4. Gastroesophageal reflux disease, esophagitis presence not specified   5. Esophageal dysmotility   6. Morbid obesity (HCC)     1. Continue Dulera 200 two inhalations two times per day with spacer.  2. "Action Plan" for asthma flare up:   A. Add FLOVENT 110 - 3 inhalations 3 times per day to [facility]  2. Continue Prilosec 40 mg twice a day and Ranitidine  one time per day in the evening  3. Continue Nasonex/Flonase one spray each nostril 1 - 3 times per week. Aim away from septum.  4. Continue Allegra 180 one tablet one time per day.    5. If needed:   A. nasal saline spray multiple times a day  B. Azelastine 1-2 sprays each nostril one-2 times a day  C. Proventil HFA 2 puffs or albuterol nebulization every 4-6 hours  6. NEED GI evaluation for upper endoscopy. Eosinophilic esophagitis?  7. No tobacco smoke exposure inside the house or car.  8. Submit for NUCALA administration   9. Obtain fall flu vaccine  10. Return to clinic in 12 weeks or earlier if problem   We will now start Crystal on a biological agent with the use of mepolizumab given her lack of control of her asthma utilizing a very large collection of inhaled steroids and a long-acting bronchodilator. She will continue to treat reflux but I have once again asked her to see her gastroenterologist for an evaluation of her esophageal dysmotility which could suggest eosinophilic esophagitis. I will see her back in this clinic in 12 weeks or earlier if there is a problem.  Laurette Schimke, MD Allergy / Immunology [facility]

## 2016-11-30 ENCOUNTER — Ambulatory Visit: Payer: Self-pay | Admitting: Cardiology

## 2016-12-01 ENCOUNTER — Ambulatory Visit: Payer: Self-pay | Admitting: Cardiology

## 2016-12-06 ENCOUNTER — Ambulatory Visit: Payer: Self-pay | Admitting: Cardiology

## 2016-12-08 ENCOUNTER — Ambulatory Visit (INDEPENDENT_AMBULATORY_CARE_PROVIDER_SITE_OTHER): Payer: Medicaid Other | Admitting: *Deleted

## 2016-12-08 DIAGNOSIS — J4550 Severe persistent asthma, uncomplicated: Secondary | ICD-10-CM

## 2016-12-08 MED ORDER — MEPOLIZUMAB 100 MG ~~LOC~~ SOLR
100.0000 mg | SUBCUTANEOUS | Status: DC
  Administered 2016-12-08 – 2017-04-13 (×5): 100 mg via SUBCUTANEOUS

## 2016-12-08 NOTE — Progress Notes (Signed)
Patient started Nucala 100mg  injections every 28 days for asthma. Reviewed side effects, injection schedule and how/when to use epipen. Patient already has epipen knows how to use same

## 2016-12-10 ENCOUNTER — Ambulatory Visit: Payer: Self-pay | Admitting: Cardiology

## 2016-12-14 ENCOUNTER — Other Ambulatory Visit: Payer: Self-pay | Admitting: Cardiology

## 2016-12-14 ENCOUNTER — Ambulatory Visit (INDEPENDENT_AMBULATORY_CARE_PROVIDER_SITE_OTHER): Payer: Medicaid Other | Admitting: Cardiology

## 2016-12-14 ENCOUNTER — Encounter: Payer: Self-pay | Admitting: Cardiology

## 2016-12-14 VITALS — BP 110/60 | HR 84 | Resp 160 | Ht 63.0 in | Wt >= 6400 oz

## 2016-12-14 DIAGNOSIS — R002 Palpitations: Secondary | ICD-10-CM

## 2016-12-14 DIAGNOSIS — M7989 Other specified soft tissue disorders: Secondary | ICD-10-CM | POA: Diagnosis not present

## 2016-12-14 DIAGNOSIS — E8881 Metabolic syndrome: Secondary | ICD-10-CM

## 2016-12-14 DIAGNOSIS — F4310 Post-traumatic stress disorder, unspecified: Secondary | ICD-10-CM | POA: Diagnosis not present

## 2016-12-14 HISTORY — DX: Other specified soft tissue disorders: M79.89

## 2016-12-14 NOTE — Patient Instructions (Addendum)
Medication Instructions:  Your physician recommends that you continue on your current medications as directed. Please refer to the Current Medication list given to you today.  Labwork: Your physician recommends that you have lab work today to check your fluid congestion test and Kidney function as well as sodium level and potassium.   Testing/Procedures: Your physician has requested that you have an echocardiogram. Echocardiography is a painless test that uses sound waves to create images of your heart. It provides your doctor with information about the size and shape of your heart and how well your heart's chambers and valves are working. This procedure takes approximately one hour. There are no restrictions for this procedure.  Follow-Up: Your physician recommends that you schedule a follow-up appointment in: 3 months   Any Other Special Instructions Will Be Listed Below (If Applicable).  Please note that any paperwork needing to be filled out by the provider will need to be addressed at the front desk prior to seeing the provider. Please note that any paperwork FMLA, Disability or other documents regarding health condition is subject to a $25.00 charge that must be received prior to completion of paperwork in the form of a money order or check.     If you need a refill on your cardiac medications before your next appointment, please call your pharmacy.

## 2016-12-14 NOTE — Progress Notes (Signed)
Cardiology Office Note:    Date:  12/14/2016   ID:  Misty [name], DOB 11/03/1995, MRN [id number]  PCP:  Nancie Neas, PA  Cardiologist:  [name], MD    Referring MD: Nancie Neas, [place]   Chief Complaint  Patient presents with  . Follow-up  I am swelling  History of Present Illness:    Misty [name] is a 21 y.o. female with morbid obesity psychiatric issue.  She started complaining of having swelling of lower extremities.  Her primary care physician gave her diuretics however there was no response and she was referred to [place] for evaluation.  She did have echocardiogram done previously which showed preserved ejection fraction however I need to make sure there is no significant congestive heart failure.  Therefore I will ask her to have Chem-7 and proBNP.  Echocardiogram will be scheduled.  Honestly I do not think she does have significant congestive heart failure think swelling of lower extremities related to her morbid obesity.  Likely she decided to sign up for class to learn how to eat and also decided to try to exercise some but she gets short of breath easily  Past Medical History:  Diagnosis Date  . Allergic rhinitis   . Anxiety   . Depression   . GERD (gastroesophageal reflux disease)   . HTN (hypertension) 07/13/12  . IBS (irritable bowel syndrome)   . Obesity   . OSA on CPAP   . Psoriasis     Past Surgical History:  Procedure Laterality Date  . ADENOIDECTOMY    . SINOSCOPY    . TONSILLECTOMY AND ADENOIDECTOMY  Age 72   Deviated septum corrected at same time    Current Medications: Current Meds  Medication Sig  . albuterol (PROVENTIL HFA) 108 (90 Base) MCG/ACT inhaler Inhale two puffs every four to six hours as needed for cough or wheeze.  Marland Kitchen azelastine (ASTELIN) 0.1 % nasal spray Can use one to two sprays in each nostril one to two times daily as needed.  Marland Kitchen azelastine (OPTIVAR) 0.05 % ophthalmic solution Place 1 drop into both eyes 2 (two) times  daily.  Marland Kitchen buPROPion (WELLBUTRIN XL) 300 MG 24 hr tablet Take 1 tablet (300 mg total) by mouth daily.  . busPIRone (BUSPAR) 15 MG tablet Take 15 mg by mouth 2 (two) times daily.  . carvedilol (COREG) 25 MG tablet Take 25 mg by mouth 2 (two) times daily with a meal.  . Cholecalciferol (VITAMIN D) 2000 units tablet Take 2,000 Units by mouth daily.  . divalproex (DEPAKOTE ER) 250 MG 24 hr tablet TAKE ONE TABLET BY MOUTH AT BEDTIME FOR 7 DAYS, THEN TAKE TWO TABLETS AT BEDTIME  . EPINEPHrine 0.3 mg/0.3 mL IJ SOAJ injection Use as directed for life threatening allergic reaction  . ferrous sulfate 325 (65 FE) MG tablet See admin instructions.  . fexofenadine (ALLEGRA) 180 MG tablet TAKE ONE TABLET BY MOUTH DAILY  . fluticasone (FLOVENT HFA) 110 MCG/ACT inhaler Inhale three puffs three times daily during flare-up.  Rinse, gargle, and spit after use.  . furosemide (LASIX) 40 MG tablet Take 40 mg by mouth daily.  Marland Kitchen ipratropium-albuterol (DUONEB) 0.5-2.5 (3) MG/3ML SOLN Take 3 mLs by nebulization every 6 (six) hours as needed.  . lamoTRIgine (LAMICTAL) 25 MG tablet Take 2 tablets (50 mg total) by mouth 2 (two) times daily.  Marland Kitchen lisinopril (PRINIVIL,ZESTRIL) 10 MG tablet Take 10 mg by mouth daily.  . Magnesium 400 MG TABS Takes one and one-half tablet  at lunchtime.  . mometasone-formoterol (DULERA) 200-5 MCG/ACT AERO Inhale two puffs twice a day to prevent cough or wheeze.  Rinse, gargle, and spit after use.  . montelukast (SINGULAIR) 10 MG tablet Take 10 mg at bedtime by mouth.  . Multiple Vitamin (MULTIVITAMIN WITH MINERALS) TABS tablet Take 1 tablet by mouth daily.  . Olopatadine HCl (PATADAY) 0.2 % SOLN Place 1 drop into both eyes 1 day or 1 dose.  Marland Kitchen. omeprazole (PRILOSEC) 40 MG capsule Take one capsule by mouth twice a day.  . paliperidone (INVEGA SUSTENNA) 234 MG/1.5ML SUSP injection Inject 243 mg every 30 (thirty) days into the muscle.  . paliperidone (INVEGA) 6 MG 24 hr tablet Take 6 mg 2 (two) times  daily by mouth.   . potassium chloride (K-DUR) 10 MEQ tablet Take 10 mEq by mouth daily.  . prazosin (MINIPRESS) 1 MG capsule Take 2 mg by mouth at bedtime.  . progesterone (PROMETRIUM) 200 MG capsule Take 200 mg by mouth at bedtime.  . ranitidine (ZANTAC) 300 MG tablet Take 1 tablet (300 mg total) by mouth at bedtime.  . topiramate (TOPAMAX) 100 MG tablet Take 1 tablet daily by mouth.  Marland Kitchen. TREMFYA 100 MG/ML SOSY INJECT ONE syringe Subcutaneously every EIGHT WEEKS (AFTER starter doses at WEEKS 0 and 4)  . VIBERZI 100 MG TABS Take 1 tablet by mouth 2 (two) times daily.  . Vitamin D, Ergocalciferol, (DRISDOL) 50000 units CAPS capsule TAKE ONE CAPSULE BY MOUTH ONCE WEEKLY FOR EIGHT WEEKS  . zonisamide (ZONEGRAN) 100 MG capsule Take 3 tablets by mouth at bedtime   Current Facility-Administered Medications for the 12/14/16 encounter (Office Visit) with Georgeanna LeaKrasowski, Alonzo Owczarzak J, MD  Medication  . Mepolizumab SOLR 100 mg     Allergies:   Ultram [tramadol] and Adhesive [tape]   Social History   Socioeconomic History  . Marital status: Single    Spouse name: Not on file  . Number of children: Not on file  . Years of education: Not on file  . Highest education level: Not on file  Social Needs  . Financial resource strain: Not on file  . Food insecurity - worry: Not on file  . Food insecurity - inability: Not on file  . Transportation needs - medical: Not on file  . Transportation needs - non-medical: Not on file  Occupational History  . Occupation: STUDENT    Employer: [employer]    Comment: 11th grade [employer]  Tobacco Use  . Smoking status: Passive Smoke Exposure - Never Smoker  . Smokeless tobacco: Never Used  Substance and Sexual Activity  . Alcohol use: No  . Drug use: No  . Sexual activity: No  Other Topics Concern  . Not on file  Social History Narrative  . Not on file     Family History: The patient's family history includes Allergic rhinitis in her mother; Bipolar disorder  in her father; Depression in her maternal grandmother and mother; Diabetes in her maternal grandmother; Drug abuse in her father; Hypertension in her father and maternal grandmother. ROS:   Please see the history of present illness.    All 14 point review of systems negative except as described per history of present illness  EKGs/Labs/Other Studies Reviewed:      Recent Labs: 04/05/2016: ALT 42; Magnesium 2.1 04/07/2016: BUN 13; Creatinine, Ser 0.75; Hemoglobin 12.2; Platelets 228; Potassium 4.3; Sodium 136  Recent Lipid Panel    Component Value Date/Time   CHOL 159 03/01/2012 0625   TRIG  93 03/01/2012 0625   HDL 47 03/01/2012 0625   CHOLHDL 3.4 03/01/2012 0625   VLDL 19 03/01/2012 0625   LDLCALC 93 03/01/2012 0625    Physical Exam:    VS:  BP 110/60   Pulse 84   Resp (!) 160   Ht 5\' 3"  (1.6 m)   Wt (!) 439 lb (199.1 kg)   BMI 77.77 kg/m     Wt Readings from Last 3 Encounters:  12/14/16 (!) 439 lb (199.1 kg)  04/04/16 (!) 411 lb 11.2 oz (186.7 kg)  08/18/15 (!) 418 lb 14 oz (190 kg)     GEN:  Well nourished, well developed in no acute distress HEENT: Normal NECK: No JVD; No carotid bruits LYMPHATICS: No lymphadenopathy CARDIAC: RRR, no murmurs, no rubs, no gallops RESPIRATORY:  Clear to auscultation without rales, wheezing or rhonchi  ABDOMEN: Soft, non-tender, non-distended MUSCULOSKELETAL:  No edema; No deformity  SKIN: Warm and dry LOWER EXTREMITIES: no swelling NEUROLOGIC:  Alert and oriented x 3 PSYCHIATRIC:  Normal affect   ASSESSMENT:    1. Metabolic syndrome   2. Posttraumatic stress disorder   3. Palpitations   4. Swelling of both lower extremities    PLAN:    In order of problems listed above:  1. Metabolic syndrome: Weight management was discussed with the patient she signed up for classes 2. Lower extremities edema: None as outlined above which include echocardiogram Chem-7 and proBNP 3. Palpitations: Denies having any   Medication  Adjustments/Labs and Tests Ordered: Current medicines are reviewed at length with the patient today.  Concerns regarding medicines are outlined above.  No orders of the defined types were placed in this encounter.  Medication changes: No orders of the defined types were placed in this encounter.   Signed, Georgeanna Leaobert J. Adonai Helzer, MD, [facility] 12/14/2016 4:16 PM    [facility]

## 2016-12-15 LAB — BASIC METABOLIC PANEL
BUN/Creatinine Ratio: 16 (ref 9–23)
BUN: 11 mg/dL (ref 6–20)
CALCIUM: 9 mg/dL (ref 8.7–10.2)
CO2: 20 mmol/L (ref 20–29)
CREATININE: 0.7 mg/dL (ref 0.57–1.00)
Chloride: 107 mmol/L — ABNORMAL HIGH (ref 96–106)
GFR, EST AFRICAN AMERICAN: 143 mL/min/{1.73_m2} (ref >59)
GFR, EST NON AFRICAN AMERICAN: 124 mL/min/{1.73_m2} (ref >59)
Glucose: 103 mg/dL — ABNORMAL HIGH (ref 65–99)
POTASSIUM: 4.4 mmol/L (ref 3.5–5.2)
Sodium: 143 mmol/L (ref 134–144)

## 2016-12-15 LAB — BRAIN NATRIURETIC PEPTIDE: BNP: 14 pg/mL (ref 0.0–100.0)

## 2016-12-16 ENCOUNTER — Other Ambulatory Visit (HOSPITAL_BASED_OUTPATIENT_CLINIC_OR_DEPARTMENT_OTHER): Payer: Self-pay

## 2016-12-17 ENCOUNTER — Telehealth: Payer: Self-pay | Admitting: Cardiology

## 2016-12-17 DIAGNOSIS — Z79899 Other long term (current) drug therapy: Secondary | ICD-10-CM

## 2016-12-17 NOTE — Telephone Encounter (Signed)
S/w mother and advised that per Dr. Bing MatterKrasowski pt can increase lasix to 60 mg daily over the weekend and increase K+ to 15 mEq daily and call back on Monday. Pt verbalized understanding.

## 2016-12-17 NOTE — Telephone Encounter (Signed)
Patient is stillhaving severs Edema in her legs ad the fluid pill seems to not be working, they are concerned, please call mother.

## 2016-12-20 NOTE — Telephone Encounter (Signed)
S/w pts mother regarding the lasix. Dr. Bing MatterKrasowski suggested that we could try 80 mg of lasix however he is worried about the kidney function and pt will need repeat labs in 2 days. I have advised the mother of this and even advised that if she chose to stay on the 60 mg of lasix for a few more days we will still need repeat labs drawn to assess kidney function. She verbalized understanding and states that she will discuss this with the pt and call the office to relay the message of which dosage the pt would be taking.

## 2016-12-20 NOTE — Addendum Note (Signed)
Addended by: Rodney LangtonITTER, JADA M on: 12/20/2016 03:53 PM   Modules accepted: Orders

## 2016-12-20 NOTE — Telephone Encounter (Signed)
pts mother returned call today to follow up on recent med increase of lasix and swelling. She states that the pt had no change in her swelling. Message routed to Dr. Bing MatterKrasowski for further review.

## 2016-12-21 ENCOUNTER — Telehealth: Payer: Self-pay

## 2016-12-21 NOTE — Telephone Encounter (Signed)
Pt called back today to inform me that she will be taking 60 mg of lasix daily and have labs drawn at [facility] on Friday.

## 2016-12-28 ENCOUNTER — Other Ambulatory Visit: Payer: Self-pay

## 2016-12-28 DIAGNOSIS — Z79899 Other long term (current) drug therapy: Secondary | ICD-10-CM

## 2016-12-28 LAB — BASIC METABOLIC PANEL
BUN / CREAT RATIO: 31 — AB (ref 9–23)
BUN: 15 mg/dL (ref 6–20)
CHLORIDE: 106 mmol/L (ref 96–106)
CO2: 22 mmol/L (ref 20–29)
Calcium: 8.9 mg/dL (ref 8.7–10.2)
Creatinine, Ser: 0.49 mg/dL — ABNORMAL LOW (ref 0.57–1.00)
GFR calc non Af Amer: 140 mL/min/{1.73_m2} (ref >59)
GFR, EST AFRICAN AMERICAN: 161 mL/min/{1.73_m2} (ref >59)
Glucose: 86 mg/dL (ref 65–99)
POTASSIUM: 4.5 mmol/L (ref 3.5–5.2)
Sodium: 142 mmol/L (ref 134–144)

## 2016-12-28 MED ORDER — FUROSEMIDE 40 MG PO TABS: 40.0000 mg | ORAL_TABLET | Freq: Two times a day (BID) | ORAL | 3 refills | Status: DC

## 2016-12-28 MED ORDER — POTASSIUM CHLORIDE ER 10 MEQ PO TBCR: 10.0000 meq | EXTENDED_RELEASE_TABLET | Freq: Every day | ORAL | 3 refills | Status: DC

## 2016-12-28 NOTE — Progress Notes (Signed)
S/w pt regarding her symptoms and she states that she has been taking 60 mg of lasix daily, no change in the swelling and she states that when she elevates her legs the swelling gets worse. Dr. Bing MatterKrasowski made aware and advised to increase lasix to 80 mg daily and start potassium 10 mEq daily. Pt is aware of this and will have her labs [facility] [place]. Pt was also strongly encouraged to keep her appointment for her Echo on 12-4. Pt verbalized understanding.

## 2016-12-29 ENCOUNTER — Other Ambulatory Visit: Payer: Self-pay

## 2016-12-29 MED ORDER — POTASSIUM CHLORIDE ER 20 MEQ PO TBCR: EXTENDED_RELEASE_TABLET | ORAL | 11 refills | Status: DC

## 2017-01-05 ENCOUNTER — Other Ambulatory Visit: Payer: Self-pay

## 2017-01-05 ENCOUNTER — Ambulatory Visit (INDEPENDENT_AMBULATORY_CARE_PROVIDER_SITE_OTHER): Payer: Medicaid Other | Admitting: *Deleted

## 2017-01-05 DIAGNOSIS — Z79899 Other long term (current) drug therapy: Secondary | ICD-10-CM

## 2017-01-05 DIAGNOSIS — J4550 Severe persistent asthma, uncomplicated: Secondary | ICD-10-CM | POA: Diagnosis not present

## 2017-01-10 ENCOUNTER — Other Ambulatory Visit: Payer: Self-pay | Admitting: Allergy and Immunology

## 2017-01-11 ENCOUNTER — Ambulatory Visit (HOSPITAL_BASED_OUTPATIENT_CLINIC_OR_DEPARTMENT_OTHER): Payer: Medicaid Other

## 2017-01-13 LAB — BASIC METABOLIC PANEL
BUN/Creatinine Ratio: 22 (ref 9–23)
BUN: 14 mg/dL (ref 6–20)
CALCIUM: 8.9 mg/dL (ref 8.7–10.2)
CO2: 24 mmol/L (ref 20–29)
CREATININE: 0.65 mg/dL (ref 0.57–1.00)
Chloride: 104 mmol/L (ref 96–106)
GFR calc Af Amer: 147 mL/min/{1.73_m2} (ref >59)
GFR, EST NON AFRICAN AMERICAN: 127 mL/min/{1.73_m2} (ref >59)
Glucose: 81 mg/dL (ref 65–99)
Potassium: 4.2 mmol/L (ref 3.5–5.2)
Sodium: 143 mmol/L (ref 134–144)

## 2017-01-26 ENCOUNTER — Telehealth: Payer: Self-pay | Admitting: Cardiology

## 2017-01-26 NOTE — Telephone Encounter (Signed)
Patient needs a call regarding Medicaid not covering her ultrasound.

## 2017-01-26 NOTE — Telephone Encounter (Signed)
Advised patient that per appointment note prior auth was not required. Billing number given to patient for clarification.

## 2017-02-03 ENCOUNTER — Ambulatory Visit (INDEPENDENT_AMBULATORY_CARE_PROVIDER_SITE_OTHER): Payer: Medicaid Other | Admitting: *Deleted

## 2017-02-03 DIAGNOSIS — J4550 Severe persistent asthma, uncomplicated: Secondary | ICD-10-CM | POA: Diagnosis not present

## 2017-02-07 ENCOUNTER — Ambulatory Visit: Payer: Medicaid Other | Admitting: Allergy and Immunology

## 2017-02-10 ENCOUNTER — Other Ambulatory Visit: Payer: Self-pay | Admitting: Cardiology

## 2017-02-17 ENCOUNTER — Ambulatory Visit (HOSPITAL_BASED_OUTPATIENT_CLINIC_OR_DEPARTMENT_OTHER)
Admission: RE | Admit: 2017-02-17 | Discharge: 2017-02-17 | Disposition: A | Discharge status: Home / Self Care | Payer: Medicaid Other | Source: Ambulatory Visit | Attending: Cardiology | Admitting: Cardiology

## 2017-02-17 DIAGNOSIS — I10 Essential (primary) hypertension: Secondary | ICD-10-CM | POA: Diagnosis not present

## 2017-02-17 DIAGNOSIS — E8881 Metabolic syndrome: Secondary | ICD-10-CM

## 2017-02-17 DIAGNOSIS — R002 Palpitations: Secondary | ICD-10-CM

## 2017-02-17 DIAGNOSIS — M7989 Other specified soft tissue disorders: Secondary | ICD-10-CM

## 2017-02-17 DIAGNOSIS — F4310 Post-traumatic stress disorder, unspecified: Secondary | ICD-10-CM

## 2017-02-17 NOTE — Progress Notes (Signed)
  Echocardiogram 2D Echocardiogram has been performed.  Drelyn Pistilli T Amahia Madonia 02/17/2017, 12:51 PM

## 2017-02-22 ENCOUNTER — Other Ambulatory Visit: Payer: Self-pay | Admitting: Cardiology

## 2017-03-02 ENCOUNTER — Ambulatory Visit: Payer: Medicaid Other

## 2017-03-02 ENCOUNTER — Ambulatory Visit (INDEPENDENT_AMBULATORY_CARE_PROVIDER_SITE_OTHER): Payer: Medicaid Other

## 2017-03-02 DIAGNOSIS — J4550 Severe persistent asthma, uncomplicated: Secondary | ICD-10-CM | POA: Diagnosis not present

## 2017-03-21 ENCOUNTER — Ambulatory Visit: Payer: Medicaid Other | Admitting: Allergy and Immunology

## 2017-03-23 DIAGNOSIS — F333 Major depressive disorder, recurrent, severe with psychotic symptoms: Secondary | ICD-10-CM

## 2017-03-23 DIAGNOSIS — F251 Schizoaffective disorder, depressive type: Secondary | ICD-10-CM

## 2017-03-23 HISTORY — DX: Schizoaffective disorder, depressive type: F25.1

## 2017-03-23 HISTORY — DX: Major depressive disorder, recurrent, severe with psychotic symptoms: F33.3

## 2017-03-30 ENCOUNTER — Ambulatory Visit: Payer: Medicaid Other

## 2017-04-01 ENCOUNTER — Telehealth: Payer: Self-pay | Admitting: Cardiology

## 2017-04-01 NOTE — Telephone Encounter (Signed)
Informed patient that we would reach out to appropriate resources to see if we can find any thing out. Encouraged the patient to also do so herself.

## 2017-04-01 NOTE — Telephone Encounter (Signed)
Patient concerned that Medicaid will not allow her this visit, apparently she can only have so many they will pay for. Can this be verified?

## 2017-04-04 ENCOUNTER — Ambulatory Visit: Payer: Self-pay | Admitting: Cardiology

## 2017-04-04 NOTE — Telephone Encounter (Signed)
Per billing the patient would need to reach out to her worker.

## 2017-04-04 NOTE — Telephone Encounter (Signed)
Patient informed. 

## 2017-04-13 ENCOUNTER — Ambulatory Visit (INDEPENDENT_AMBULATORY_CARE_PROVIDER_SITE_OTHER): Payer: Medicaid Other | Admitting: *Deleted

## 2017-04-13 DIAGNOSIS — J4550 Severe persistent asthma, uncomplicated: Secondary | ICD-10-CM

## 2017-04-13 MED ORDER — FLUTICASONE PROPIONATE HFA 110 MCG/ACT IN AERO: INHALATION_SPRAY | RESPIRATORY_TRACT | 0 refills | Status: DC

## 2017-04-13 MED ORDER — MOMETASONE FURO-FORMOTEROL FUM 200-5 MCG/ACT IN AERO: INHALATION_SPRAY | RESPIRATORY_TRACT | 0 refills | Status: DC

## 2017-04-15 ENCOUNTER — Other Ambulatory Visit: Payer: Self-pay

## 2017-04-15 ENCOUNTER — Encounter (HOSPITAL_COMMUNITY): Payer: Self-pay

## 2017-04-15 ENCOUNTER — Inpatient Hospital Stay (HOSPITAL_COMMUNITY)
Admission: AD | Admit: 2017-04-15 | Discharge: 2017-04-25 | DRG: 885 | Disposition: A | Discharge status: Home / Self Care | Payer: Medicaid Other | Source: Intra-hospital | Attending: Psychiatry | Admitting: Psychiatry

## 2017-04-15 DIAGNOSIS — Z91048 Other nonmedicinal substance allergy status: Secondary | ICD-10-CM

## 2017-04-15 DIAGNOSIS — I959 Hypotension, unspecified: Secondary | ICD-10-CM | POA: Diagnosis present

## 2017-04-15 DIAGNOSIS — K760 Fatty (change of) liver, not elsewhere classified: Secondary | ICD-10-CM | POA: Diagnosis present

## 2017-04-15 DIAGNOSIS — G43909 Migraine, unspecified, not intractable, without status migrainosus: Secondary | ICD-10-CM | POA: Diagnosis present

## 2017-04-15 DIAGNOSIS — F79 Unspecified intellectual disabilities: Secondary | ICD-10-CM | POA: Diagnosis present

## 2017-04-15 DIAGNOSIS — M7989 Other specified soft tissue disorders: Secondary | ICD-10-CM | POA: Diagnosis present

## 2017-04-15 DIAGNOSIS — Z915 Personal history of self-harm: Secondary | ICD-10-CM

## 2017-04-15 DIAGNOSIS — E039 Hypothyroidism, unspecified: Secondary | ICD-10-CM | POA: Diagnosis present

## 2017-04-15 DIAGNOSIS — J45909 Unspecified asthma, uncomplicated: Secondary | ICD-10-CM | POA: Diagnosis not present

## 2017-04-15 DIAGNOSIS — R45851 Suicidal ideations: Secondary | ICD-10-CM | POA: Diagnosis present

## 2017-04-15 DIAGNOSIS — I10 Essential (primary) hypertension: Secondary | ICD-10-CM | POA: Diagnosis not present

## 2017-04-15 DIAGNOSIS — F4310 Post-traumatic stress disorder, unspecified: Secondary | ICD-10-CM | POA: Diagnosis present

## 2017-04-15 DIAGNOSIS — G4700 Insomnia, unspecified: Secondary | ICD-10-CM | POA: Diagnosis present

## 2017-04-15 DIAGNOSIS — R632 Polyphagia: Secondary | ICD-10-CM | POA: Diagnosis present

## 2017-04-15 DIAGNOSIS — Z818 Family history of other mental and behavioral disorders: Secondary | ICD-10-CM

## 2017-04-15 DIAGNOSIS — K580 Irritable bowel syndrome with diarrhea: Secondary | ICD-10-CM | POA: Diagnosis present

## 2017-04-15 DIAGNOSIS — F419 Anxiety disorder, unspecified: Secondary | ICD-10-CM | POA: Diagnosis not present

## 2017-04-15 DIAGNOSIS — F411 Generalized anxiety disorder: Secondary | ICD-10-CM | POA: Diagnosis present

## 2017-04-15 DIAGNOSIS — Z79899 Other long term (current) drug therapy: Secondary | ICD-10-CM

## 2017-04-15 DIAGNOSIS — Z62811 Personal history of psychological abuse in childhood: Secondary | ICD-10-CM | POA: Diagnosis not present

## 2017-04-15 DIAGNOSIS — G4733 Obstructive sleep apnea (adult) (pediatric): Secondary | ICD-10-CM | POA: Diagnosis present

## 2017-04-15 DIAGNOSIS — F515 Nightmare disorder: Secondary | ICD-10-CM | POA: Diagnosis present

## 2017-04-15 DIAGNOSIS — Z6841 Body Mass Index (BMI) 40.0 and over, adult: Secondary | ICD-10-CM | POA: Diagnosis not present

## 2017-04-15 DIAGNOSIS — F3130 Bipolar disorder, current episode depressed, mild or moderate severity, unspecified: Principal | ICD-10-CM | POA: Diagnosis present

## 2017-04-15 DIAGNOSIS — Z79890 Hormone replacement therapy (postmenopausal): Secondary | ICD-10-CM

## 2017-04-15 DIAGNOSIS — R440 Auditory hallucinations: Secondary | ICD-10-CM | POA: Diagnosis present

## 2017-04-15 DIAGNOSIS — F319 Bipolar disorder, unspecified: Secondary | ICD-10-CM | POA: Diagnosis present

## 2017-04-15 DIAGNOSIS — R450 Nervousness: Secondary | ICD-10-CM | POA: Diagnosis not present

## 2017-04-15 DIAGNOSIS — K219 Gastro-esophageal reflux disease without esophagitis: Secondary | ICD-10-CM | POA: Diagnosis present

## 2017-04-15 DIAGNOSIS — E669 Obesity, unspecified: Secondary | ICD-10-CM | POA: Diagnosis not present

## 2017-04-15 DIAGNOSIS — F603 Borderline personality disorder: Secondary | ICD-10-CM | POA: Diagnosis present

## 2017-04-15 DIAGNOSIS — Z888 Allergy status to other drugs, medicaments and biological substances status: Secondary | ICD-10-CM

## 2017-04-15 DIAGNOSIS — Z813 Family history of other psychoactive substance abuse and dependence: Secondary | ICD-10-CM | POA: Diagnosis not present

## 2017-04-15 DIAGNOSIS — Z62810 Personal history of physical and sexual abuse in childhood: Secondary | ICD-10-CM | POA: Diagnosis not present

## 2017-04-15 HISTORY — DX: Bipolar disorder, unspecified: F31.9

## 2017-04-15 MED ORDER — PRAZOSIN HCL 2 MG PO CAPS
2.0000 mg | ORAL_CAPSULE | Freq: Every day | ORAL | Status: DC
  Administered 2017-04-15 – 2017-04-24 (×9): 2 mg via ORAL
  Filled 2017-04-15 (×6): qty 1
  Filled 2017-04-15: qty 2
  Filled 2017-04-15 (×4): qty 1
  Filled 2017-04-15: qty 2
  Filled 2017-04-15 (×2): qty 1

## 2017-04-15 MED ORDER — FUROSEMIDE 40 MG PO TABS
40.0000 mg | ORAL_TABLET | Freq: Every day | ORAL | Status: DC
  Administered 2017-04-16 – 2017-04-17 (×2): 40 mg via ORAL
  Filled 2017-04-15 (×3): qty 1

## 2017-04-15 MED ORDER — MAGNESIUM HYDROXIDE 400 MG/5ML PO SUSP: 30.0000 mL | Freq: Every day | ORAL | Status: DC | PRN

## 2017-04-15 MED ORDER — LEVOTHYROXINE SODIUM 25 MCG PO TABS
25.0000 ug | ORAL_TABLET | Freq: Every day | ORAL | Status: DC
  Administered 2017-04-16 – 2017-04-25 (×10): 25 ug via ORAL
  Filled 2017-04-15 (×13): qty 1

## 2017-04-15 MED ORDER — ZONISAMIDE 100 MG PO CAPS
300.0000 mg | ORAL_CAPSULE | Freq: Every day | ORAL | Status: DC
  Administered 2017-04-16 – 2017-04-17 (×2): 300 mg via ORAL
  Filled 2017-04-15 (×6): qty 3

## 2017-04-15 MED ORDER — CARVEDILOL 25 MG PO TABS
25.0000 mg | ORAL_TABLET | Freq: Two times a day (BID) | ORAL | Status: DC
  Administered 2017-04-16 (×2): 25 mg via ORAL
  Filled 2017-04-15 (×5): qty 1

## 2017-04-15 MED ORDER — MONTELUKAST SODIUM 10 MG PO TABS
10.0000 mg | ORAL_TABLET | Freq: Every day | ORAL | Status: DC
  Administered 2017-04-15 – 2017-04-24 (×10): 10 mg via ORAL
  Filled 2017-04-15 (×14): qty 1

## 2017-04-15 MED ORDER — TRAZODONE HCL 50 MG PO TABS
50.0000 mg | ORAL_TABLET | Freq: Every evening | ORAL | Status: DC | PRN
  Administered 2017-04-15 – 2017-04-18 (×3): 50 mg via ORAL
  Filled 2017-04-15 (×3): qty 1

## 2017-04-15 MED ORDER — PANTOPRAZOLE SODIUM 40 MG PO TBEC
40.0000 mg | DELAYED_RELEASE_TABLET | Freq: Two times a day (BID) | ORAL | Status: DC
  Administered 2017-04-15 – 2017-04-25 (×21): 40 mg via ORAL
  Filled 2017-04-15 (×27): qty 1

## 2017-04-15 MED ORDER — ALBUTEROL SULFATE HFA 108 (90 BASE) MCG/ACT IN AERS
2.0000 | INHALATION_SPRAY | RESPIRATORY_TRACT | Status: DC | PRN
  Administered 2017-04-16 – 2017-04-19 (×2): 2 via RESPIRATORY_TRACT
  Filled 2017-04-15 (×2): qty 6.7

## 2017-04-15 MED ORDER — ALUM & MAG HYDROXIDE-SIMETH 200-200-20 MG/5ML PO SUSP: 30.0000 mL | ORAL | Status: DC | PRN

## 2017-04-15 MED ORDER — PALIPERIDONE ER 3 MG PO TB24
3.0000 mg | ORAL_TABLET | Freq: Every day | ORAL | Status: DC
  Administered 2017-04-16 – 2017-04-17 (×2): 3 mg via ORAL
  Filled 2017-04-15 (×5): qty 1

## 2017-04-15 MED ORDER — LORATADINE 10 MG PO TABS
10.0000 mg | ORAL_TABLET | Freq: Every day | ORAL | Status: DC
  Administered 2017-04-16 – 2017-04-25 (×10): 10 mg via ORAL
  Filled 2017-04-15 (×13): qty 1

## 2017-04-15 MED ORDER — ACETAMINOPHEN 325 MG PO TABS
650.0000 mg | ORAL_TABLET | Freq: Four times a day (QID) | ORAL | Status: DC | PRN
  Administered 2017-04-15 – 2017-04-25 (×19): 650 mg via ORAL
  Filled 2017-04-15 (×19): qty 2

## 2017-04-15 MED ORDER — CARIPRAZINE HCL 3 MG PO CAPS
4.5000 mg | ORAL_CAPSULE | Freq: Every day | ORAL | Status: DC
  Administered 2017-04-16: 4.5 mg via ORAL
  Filled 2017-04-15 (×2): qty 1

## 2017-04-15 MED ORDER — HYDROXYZINE HCL 25 MG PO TABS
25.0000 mg | ORAL_TABLET | Freq: Three times a day (TID) | ORAL | Status: DC | PRN
  Administered 2017-04-18 – 2017-04-25 (×14): 25 mg via ORAL
  Filled 2017-04-15 (×15): qty 1

## 2017-04-15 MED ORDER — DIVALPROEX SODIUM ER 500 MG PO TB24
500.0000 mg | ORAL_TABLET | Freq: Three times a day (TID) | ORAL | Status: DC
  Administered 2017-04-15 – 2017-04-16 (×2): 500 mg via ORAL
  Filled 2017-04-15 (×7): qty 1

## 2017-04-15 MED ORDER — POTASSIUM CHLORIDE CRYS ER 10 MEQ PO TBCR
10.0000 meq | EXTENDED_RELEASE_TABLET | Freq: Every day | ORAL | Status: DC
  Administered 2017-04-16 – 2017-04-18 (×3): 10 meq via ORAL
  Filled 2017-04-15 (×5): qty 1

## 2017-04-15 MED ORDER — TOPIRAMATE 100 MG PO TABS
100.0000 mg | ORAL_TABLET | Freq: Every day | ORAL | Status: DC
  Administered 2017-04-16 – 2017-04-18 (×3): 100 mg via ORAL
  Filled 2017-04-15 (×5): qty 1

## 2017-04-15 MED ORDER — BUSPIRONE HCL 10 MG PO TABS
20.0000 mg | ORAL_TABLET | Freq: Three times a day (TID) | ORAL | Status: DC
  Administered 2017-04-16: 20 mg via ORAL
  Filled 2017-04-15 (×4): qty 2

## 2017-04-15 NOTE — Progress Notes (Signed)
Misty [name] is a 22 year old female being admitted voluntarily to [address].  She came into the ED for auditory hallucinations telling her to jump off a bridge.  She has history of self harm in the past.  [facility] she was pleasant and cooperative.  Guarded with answers and noted having a fixed smile.  She continues to hear voices on a regular basis and states that it gets worse because she isn't sleeping well.  She denies any SI/HI at this time and will contract for safety on the unit.  She denies any pain at this moment.  She reported that she had to come off of lithium recently because she became toxic but "I like the lithium better because the Depakote doesn't seem to be helping."  She reported her recent stressors are family conflict and her biological father is in a coma and she isn't sure if he is going to wake up.  [facility] [place]  She is hoping to get into a group home when she is discharged.  Oriented her to the unit.  Admission paperwork completed and signed.  Belongings searched and secured in locker # 16, no contraband found.  Skin assessment completed and noted bilateral pitting edema in both lower legs and feet.  No other skin issues noted  Q 15 minute checks initiated for safety.  We will continue to monitor the progress towards her goals.

## 2017-04-15 NOTE — Tx Team (Signed)
Initial Treatment Plan 04/15/2017 9:22 PM Florabel Gasbarro ZOX:[id number]    PATIENT STRESSORS: Health problems Marital or family conflict Medication change or noncompliance Traumatic event   PATIENT STRENGTHS: Wellsite geologistCommunication skills General fund of knowledge Motivation for treatment/growth   PATIENT IDENTIFIED PROBLEMS: Suicidal ideation  Psychosis  "Find the right cocktail of medicines"  "I want to see about going to a group home"               DISCHARGE CRITERIA:  Improved stabilization in mood, thinking, and/or behavior Verbal commitment to aftercare and medication compliance  PRELIMINARY DISCHARGE PLAN: Outpatient therapy Medication management  PATIENT/FAMILY INVOLVEMENT: This treatment plan has been presented to and [facility] with the patient, [facility].  The patient and family have been given the opportunity to ask questions and make suggestions.  Levin BaconHeather V Eunice Winecoff, RN 04/15/2017, 9:22 PM
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
## 2017-04-16 DIAGNOSIS — Z62811 Personal history of psychological abuse in childhood: Secondary | ICD-10-CM

## 2017-04-16 DIAGNOSIS — Z813 Family history of other psychoactive substance abuse and dependence: Secondary | ICD-10-CM

## 2017-04-16 DIAGNOSIS — R45851 Suicidal ideations: Secondary | ICD-10-CM

## 2017-04-16 DIAGNOSIS — Z818 Family history of other mental and behavioral disorders: Secondary | ICD-10-CM

## 2017-04-16 DIAGNOSIS — F419 Anxiety disorder, unspecified: Secondary | ICD-10-CM

## 2017-04-16 DIAGNOSIS — Z62810 Personal history of physical and sexual abuse in childhood: Secondary | ICD-10-CM

## 2017-04-16 DIAGNOSIS — R440 Auditory hallucinations: Secondary | ICD-10-CM

## 2017-04-16 DIAGNOSIS — R450 Nervousness: Secondary | ICD-10-CM

## 2017-04-16 MED ORDER — LORAZEPAM 1 MG PO TABS
ORAL_TABLET | ORAL | Status: AC
  Administered 2017-04-16: 1 mg via ORAL
  Filled 2017-04-16: qty 1

## 2017-04-16 MED ORDER — PALIPERIDONE ER 3 MG PO TB24: 3.0000 mg | ORAL_TABLET | Freq: Every day | ORAL | 0 refills | Status: DC

## 2017-04-16 MED ORDER — HYDROXYZINE HCL 25 MG PO TABS: 25.0000 mg | ORAL_TABLET | Freq: Three times a day (TID) | ORAL | 0 refills | Status: DC | PRN

## 2017-04-16 MED ORDER — LORATADINE 10 MG PO TABS: 10.0000 mg | ORAL_TABLET | Freq: Every day | ORAL | 0 refills | Status: DC

## 2017-04-16 MED ORDER — TRAZODONE HCL 50 MG PO TABS: 50.0000 mg | ORAL_TABLET | Freq: Every evening | ORAL | 0 refills | Status: DC | PRN

## 2017-04-16 MED ORDER — LEVOTHYROXINE SODIUM 25 MCG PO TABS: 25.0000 ug | ORAL_TABLET | Freq: Every day | ORAL | 0 refills | Status: DC

## 2017-04-16 MED ORDER — DIVALPROEX SODIUM ER 500 MG PO TB24: 500.0000 mg | ORAL_TABLET | Freq: Two times a day (BID) | ORAL | 0 refills | Status: DC

## 2017-04-16 MED ORDER — CARIPRAZINE HCL 3 MG PO CAPS
3.0000 mg | ORAL_CAPSULE | Freq: Every day | ORAL | Status: DC
  Administered 2017-04-17: 3 mg via ORAL
  Filled 2017-04-16 (×2): qty 1

## 2017-04-16 MED ORDER — DIVALPROEX SODIUM ER 500 MG PO TB24
500.0000 mg | ORAL_TABLET | Freq: Two times a day (BID) | ORAL | Status: AC
  Administered 2017-04-16 – 2017-04-17 (×3): 500 mg via ORAL
  Filled 2017-04-16 (×4): qty 1

## 2017-04-16 MED ORDER — LORAZEPAM 1 MG PO TABS
1.0000 mg | ORAL_TABLET | Freq: Once | ORAL | Status: AC
  Administered 2017-04-16: 1 mg via ORAL

## 2017-04-16 MED ORDER — PRAZOSIN HCL 2 MG PO CAPS: 2.0000 mg | ORAL_CAPSULE | Freq: Every day | ORAL | 0 refills | Status: DC

## 2017-04-16 MED ORDER — CARIPRAZINE HCL 3 MG PO CAPS: 3.0000 mg | ORAL_CAPSULE | Freq: Every day | ORAL | 0 refills | Status: DC

## 2017-04-16 MED ORDER — POTASSIUM CHLORIDE CRYS ER 10 MEQ PO TBCR: 10.0000 meq | EXTENDED_RELEASE_TABLET | Freq: Every day | ORAL | 0 refills | Status: DC
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
## 2017-04-16 NOTE — Progress Notes (Addendum)
Patient ID: Misty ForeKristal [name], female   DOB: [birth year], [age] y.o.   MRN: [id number]  Pt currently presents with a tearful affect and depressed behavior. Pt unable to identify a goal tonight. Pt states "I am still hearing the voices, they are loud." Pt reports good sleep with current medication regimen. Pt says that "listening is the best way to help me." Pt reports feeling isolated since her family has not had visitors. Feels hesitant in approaching staff for needs.   Pt provided with medications per providers orders. Pt's labs and vitals were monitored throughout the night. Pt given a 1:1 about emotional and mental status. Pt supported and encouraged to express concerns and questions. Pt educated on medications and assertiveness tehcniques. Blood pressure trending low, to hold Minipress tonight. Pt voices difficulty sleeping. Provider notified, see MAR.   Pt's safety ensured with 15 minute and environmental checks. Pt currently denies SI/HI and visual hallucinations. Endorses ongoing AH. Pt verbally agrees to seek staff if SI/HI or VH occurs, AH worsens and to consult with staff before acting on any harmful thoughts. Will continue POC.
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
## 2017-04-16 NOTE — BHH Group Notes (Signed)
LCSW Group Therapy Note  04/16/2017   9:30-10:30am (300 hall)                10:30-11:30am (400 hall)                11:30am-12:00pm (500 hall)  Type of Therapy and Topic:  Group Therapy: Anger Cues and Responses  Participation Level:  Active   Description of Group:   In this group, patients learned how to recognize the physical, cognitive, emotional, and behavioral responses they have to anger-provoking situations.  They identified a recent time they became angry and how they reacted.  They analyzed how their reaction was possibly beneficial and how it was possibly unhelpful.  The group discussed a variety of healthier coping skills that could help with such a situation in the future.  Deep breathing was practiced briefly.  Therapeutic Goals: 1. Patients will remember their last incident of anger and how they felt emotionally and physically, what their thoughts were at the time, and how they behaved. 2. Patients will identify how their behavior at that time worked for them, as well as how it worked against them. 3. Patients will explore possible new behaviors to use in future anger situations. 4. Patients will learn that anger itself is normal and cannot be eliminated, and that healthier reactions can assist with resolving conflict rather than worsening situations.  Summary of Patient Progress:  The patient shared that her most recent time of anger was something she cannot remember, as she does not really allow herself to get angry; however, she later said that she holds her anger in until she explodes..  Therapeutic Modalities:   Cognitive Behavioral Therapy  Lynnell ChadMareida J Grossman-Orr  04/16/2017 12:00pm
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
## 2017-04-16 NOTE — Progress Notes (Signed)
Nursing Progress Note [id number]  D) Patient initially presents tearful and anxious after her admission to the unit and was assisted by writer to call her mom. Patient reports feeling better after speaking to her mom and states "I'm scared to be here". Patient denied needing anything else from Clinical research associatewriter. Patient is seen up in the dayroom. Patient denies SI/HI/AVH. Patient contracts for safety on the unit. Patient provided scheduled medications and reports pain to LE bilaterally. Patient with +1 pitting edema to bilateral legs. Patient provided medical bed and was assisted to elevated HOB and legs. Patient encouraged to elevate legs when resting. Patient reports she has had edema for "a long time". Dose of Invega and Zonegran not given due to medication not being available. [facility] notified; medications delivered but patient was sleeping and medications not given. Patient was complaining of poor sleep but after HOB elevated again patient observed resting again.  A) Patient educated about and provided medication as scheduled or requested per provider's orders. Patient safety maintained with q15 min safety checks. Low fall risk precautions in place. Emotional support given. 1:1 interaction and active listening provided. Snacks and fluids provided. Labs, vital signs and patient behavior monitored throughout shift. Patient encouraged to work on treatment plan.  R) Patient remains safe on the unit at this time. Patient agrees to make needs known to staff. Will continue to monitor and assess for changes.

## 2017-04-16 NOTE — Plan of Care (Signed)
Patient has remained safe and free from injury on the unit. Denies desire to harm self or others when asked although continues to endorse increased feelings of depression. Patient maintains that she can contract for safety. Will continue to monitor.

## 2017-04-16 NOTE — Progress Notes (Signed)
D: Patient alert and oriented. Affect/mood: flat, sullen, depressed. Denies SI, HI, AVH at this time. Endorses increased depression though maintains that she can remain safe and free from injury on the unit. Patient endorsed pain to bilateral lower extremities. +1 pitting edema noted upon assessment. No weeping noted, although patient endorses weeping of her legs "some of the time". Patient was encouraged to elevate her legs often to help alleviate some of the tenderness and pain associated with increased swelling. PRN tylenol given with some relief. Patient requested to lie down after lunch as she shares that she is not feeling well. Patient was unable to identify a goal to this writer when asked though shared per patient self inventory sheet that she will use mindfulness and deep breathing techniques. EKG obtained and placed in patient chart. Reports "poor" sleep with no relief from sleep medication, "fair" appetite, "low" energy level and "poor" concentration. Patient denies that she is withdrawing from drugs/alcohol, however reports having experienced diarrhea, cramping, runny nose, and cramping on self inventory sheet. Patient denied these symptoms when asked.  This Clinical research associatewriter attempted to reach [facility] to verify that patient is connected with this resource, verify medications, and schedule ACT team to assess patient for follow up plan of care. This Clinical research associatewriter and NP were both unsuccessful on multiple attempts to reach a live speaker or automated system when calling [facility]. [facility] was reached at which time speaker referred this Clinical research associatewriter to call [facility] member. [facility] called back to provide staff with ACT team phone number. HIPAA compliant voice message was left for call to be returned which at this time [facility] has not reached back.   [facility]: [phone]. [facility]: [phone]. [place]:  [phone]. [facility] ACT [facility]: [phone]. [facility]: [phone]  A: Scheduled medications administered to patient per MD order. Support and encouragement provided. Routine safety checks conducted every 15 minutes. Patient informed to notify staff with problems or concerns. Encouraged to talk to staff if feelings of harm toward self or others arise. Patient agrees.   R: No adverse drug reactions noted. Patient contracts for safety at this time. Patient compliant with medications and treatment plan. Patient receptive, calm, and cooperative. Patient interacts well with others on the unit. Patient remains safe at this time.

## 2017-04-17 DIAGNOSIS — Z79899 Other long term (current) drug therapy: Secondary | ICD-10-CM

## 2017-04-17 LAB — CBC WITH DIFFERENTIAL/PLATELET
BASOS ABS: 0 10*3/uL (ref 0.0–0.1)
Basophils Relative: 0 %
Eosinophils Absolute: 0.1 10*3/uL (ref 0.0–0.7)
Eosinophils Relative: 1 %
HCT: 42.2 % (ref 36.0–46.0)
HEMOGLOBIN: 14.1 g/dL (ref 12.0–15.0)
LYMPHS PCT: 32 %
Lymphs Abs: 3.9 10*3/uL (ref 0.7–4.0)
MCH: 29.9 pg (ref 26.0–34.0)
MCHC: 33.4 g/dL (ref 30.0–36.0)
MCV: 89.4 fL (ref 78.0–100.0)
Monocytes Absolute: 1.4 10*3/uL — ABNORMAL HIGH (ref 0.1–1.0)
Monocytes Relative: 11 %
NEUTROS PCT: 56 %
Neutro Abs: 6.9 10*3/uL (ref 1.7–7.7)
Platelets: 225 10*3/uL (ref 150–400)
RBC: 4.72 MIL/uL (ref 3.87–5.11)
RDW: 13.7 % (ref 11.5–15.5)
WBC: 12.3 10*3/uL — AB (ref 4.0–10.5)

## 2017-04-17 LAB — COMPREHENSIVE METABOLIC PANEL
ALK PHOS: 92 U/L (ref 38–126)
ALT: 25 U/L (ref 14–54)
AST: 20 U/L (ref 15–41)
Albumin: 3.6 g/dL (ref 3.5–5.0)
Anion gap: 12 (ref 5–15)
BUN: 12 mg/dL (ref 6–20)
CALCIUM: 9.2 mg/dL (ref 8.9–10.3)
CHLORIDE: 105 mmol/L (ref 101–111)
CO2: 23 mmol/L (ref 22–32)
CREATININE: 0.64 mg/dL (ref 0.44–1.00)
GFR calc non Af Amer: >60 mL/min (ref >60)
Glucose, Bld: 98 mg/dL (ref 65–99)
Potassium: 3.7 mmol/L (ref 3.5–5.1)
SODIUM: 140 mmol/L (ref 135–145)
Total Bilirubin: 0.4 mg/dL (ref 0.3–1.2)
Total Protein: 7.1 g/dL (ref 6.5–8.1)

## 2017-04-17 LAB — HEMOGLOBIN A1C
HEMOGLOBIN A1C: 5.1 % (ref 4.8–5.6)
MEAN PLASMA GLUCOSE: 99.67 mg/dL

## 2017-04-17 LAB — LIPID PANEL
Cholesterol: 155 mg/dL (ref 0–200)
HDL: 46 mg/dL (ref >40)
LDL CALC: 82 mg/dL (ref 0–99)
TRIGLYCERIDES: 135 mg/dL (ref <150)
Total CHOL/HDL Ratio: 3.4 RATIO
VLDL: 27 mg/dL (ref 0–40)

## 2017-04-17 LAB — TSH: TSH: 5.514 u[IU]/mL — AB (ref 0.350–4.500)

## 2017-04-17 MED ORDER — DIVALPROEX SODIUM ER 500 MG PO TB24
500.0000 mg | ORAL_TABLET | Freq: Every day | ORAL | Status: DC
  Administered 2017-04-18 – 2017-04-20 (×3): 500 mg via ORAL
  Filled 2017-04-17 (×6): qty 1

## 2017-04-17 MED ORDER — CARIPRAZINE HCL 1.5 MG PO CAPS
1.5000 mg | ORAL_CAPSULE | Freq: Every day | ORAL | Status: DC
  Administered 2017-04-18: 1.5 mg via ORAL
  Filled 2017-04-17 (×3): qty 1

## 2017-04-17 MED ORDER — LOPERAMIDE HCL 2 MG PO CAPS
2.0000 mg | ORAL_CAPSULE | ORAL | Status: DC | PRN
  Administered 2017-04-20 – 2017-04-24 (×4): 2 mg via ORAL
  Filled 2017-04-17: qty 2
  Filled 2017-04-17 (×4): qty 1

## 2017-04-17 NOTE — BHH Group Notes (Signed)
[facility]  Date/Time:  04/17/2017  10:00AM-11:00AM  Type of Therapy and Topic:  Group Therapy:  Music and Mood  Participation Level:  Active   Description of Group: In this process group, members listened to a variety of genres of music and identified that different types of music evoke different responses.  Patients were encouraged to identify music that was soothing for them and music that was energizing for them.  Patients discussed how this knowledge can help with wellness and recovery in various ways including managing depression and anxiety as well as encouraging healthy sleep habits.    Therapeutic Goals: 1. Patients will explore the impact of different varieties of music on mood 2. Patients will verbalize the thoughts they have when listening to different types of music 3. Patients will identify music that is soothing to them as well as music that is energizing to them 4. Patients will discuss how to use this knowledge to assist in maintaining wellness and recovery 5. Patients will explore the use of music as a coping skill  Summary of Patient Progress:  At the beginning of group, patient expressed that she did not know how she felt.  Throughout group when she was asked how different songs made her feel, she said she did not know.  At the end of group she said she still did not know how she felt.  She did listen attentively.  Therapeutic Modalities: Solution Focused Brief Therapy Activity   Ambrose MantleMareida Grossman-Orr, LCSW

## 2017-04-17 NOTE — Progress Notes (Addendum)
[facility] MD Progress Note  04/17/2017 12:58 PM Misty [name]  MRN:  [id number]   Subjective:  Patient reports that she is feeling dizzy this morning and unsteady. She has had several medication changes. She reports that she id still very depressed at 10/10  And she is having AH of self harm, but denies any SI/HI/VH and contracts for safety. She also reports taht she is wanting a group home placement as she is tired of living with her parents and all the arguments they have there. She states taht her mother is her 38, but not her legal guardian.   Objective: Patient's chart and findings reviewed and discussed with treatment team. Patient appears to be presenting more with Borderline traits the longer she is here. Her chronic depression and AH of self harm that have never went away and her reason for the hospital visit was due to argument with her parents.  Patient's BP has been trending down since admission. Hospital consult with Dr. Grandville Silos was done. Her Lasix is stopped as well as the Coreg and if her HR elevates then to restart Coreg at lowest dose BID. If she continues to be dizzy then send to ER for fluid bolus. About an hour later the patient is ambulating in the halls and in the day room interacting with peers. She is seen smiling and laughing. She has been attending groups . Her affect does not match her reported mood, she smiles when she says she is depressed at 10/10.  Due to her excessive medications will continue taper of Vraylar to 1.5 mg, Depakote to 500 mg Daily starting tomorrow, continue Invega 3 mg Daily and still waiting on ACTT to confirm [place] injection date.    Principal Problem: Bipolar I disorder ([place]) Diagnosis:   Patient Active Problem List   Diagnosis Date Noted  . Bipolar 1 disorder ([place]) [F31.9] 04/15/2017  . Swelling of both lower extremities [M79.89] 12/14/2016  . Lithium toxicity [T56.891A] 04/05/2016  . Fatty liver [K76.0] 10/10/2015  . Iron deficiency  [E61.1] 10/10/2015  . Bipolar I disorder ([place]) [F31.9] 10/10/2015  . Palpitations [R00.2] 07/21/2015  . Morbid obesity ([place]) [E66.01] 04/03/2015  . Borderline personality disorder ([place]) [F60.3] 03/14/2015  . Allergic rhinitis [J30.9] 10/08/2014  . GERD (gastroesophageal reflux disease) [K21.9] 10/08/2014  . H/O ASTHMA [J45.909] 10/08/2014  . INTERMITTENT URTICARIA [Z87.2] 10/08/2014  . Posttraumatic stress disorder [F43.10] 09/04/2014  . Sinus tachycardia [R00.0] 08/07/2014  . Hypertension [I10] 05/16/2013  . Metabolic syndrome [Q94.50] 09/04/2012  . GAD (generalized anxiety disorder) [F41.1] 07/19/2012  . ODD (oppositional defiant disorder) [F91.3] 07/19/2012  . Obstructive sleep apnea [G47.33] 07/19/2012  . MDD (major depressive disorder), recurrent episode, moderate ([place]) [F33.1] 03/01/2012   Total Time spent with patient: 30 minutes  Past Psychiatric History: See H&P  Past Medical History:  Past Medical History:  Diagnosis Date  . Allergic rhinitis   . Anxiety   . Depression   . GERD (gastroesophageal reflux disease)   . HTN (hypertension) 07/13/12  . IBS (irritable bowel syndrome)   . Obesity   . OSA on CPAP   . Psoriasis     Past Surgical History:  Procedure Laterality Date  . ADENOIDECTOMY    . SINOSCOPY    . TONSILLECTOMY AND ADENOIDECTOMY  Age 22   Deviated septum corrected at same time   Family History:  Family History  Problem Relation Age of Onset  . Depression Mother   . Allergic rhinitis Mother   . Depression Maternal Grandmother   .  Diabetes Maternal Grandmother   . Hypertension Maternal Grandmother   . Bipolar disorder Father   . Drug abuse Father   . Hypertension Father    Family Psychiatric  History: See H&P Social History:  Social History   Substance and Sexual Activity  Alcohol Use No     Social History   Substance and Sexual Activity  Drug Use No    Social History   Socioeconomic History  . Marital status: Single    Spouse  name: None  . Number of children: None  . Years of education: None  . Highest education level: None  Social Needs  . Financial resource strain: None  . Food insecurity - worry: None  . Food insecurity - inability: None  . Transportation needs - medical: None  . Transportation needs - non-medical: None  Occupational History  . 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 Smoking status: Never Smoker  . Smokeless tobacco: Never Used  Substance and Sexual Activity  . Alcohol use: No  . Drug use: No  . Sexual activity: No  Other Topics Concern  . None  Social History Narrative  . None   Additional Social History:    Pain Medications: see MAR Prescriptions: see MAR Over the Counter: see MAR History of alcohol / drug use?: No history of alcohol / drug abuse                    Sleep: Fair  Appetite:  Good  Current Medications: Current Facility-Administered Medications  Medication Dose Route Frequency Provider Last Rate Last Dose  . acetaminophen (TYLENOL) tablet 650 mg  650 mg Oral Q6H PRN Ethelene Hal, NP   650 mg at 04/17/17 1111  . albuterol (PROVENTIL HFA;VENTOLIN HFA) 108 (90 Base) MCG/ACT inhaler 2 puff  2 puff Inhalation Q4H PRN Ethelene Hal, NP   2 puff at 04/16/17 1837  . alum & mag hydroxide-simeth (MAALOX/MYLANTA) 200-200-20 MG/5ML suspension 30 mL  30 mL Oral Q4H PRN Ethelene Hal, NP      . Derrill Memo ON 04/18/2017] cariprazine (VRAYLAR) capsule 1.5 mg  1.5 mg Oral Daily Money, Darnelle Maffucci B, FNP      . divalproex (DEPAKOTE ER) 24 hr tablet 500 mg  500 mg Oral BID Money, Lowry Ram, FNP   500 mg at 04/17/17 8921  . [START ON 04/18/2017] divalproex (DEPAKOTE ER) 24 hr tablet 500 mg  500 mg Oral Daily Money, Lowry Ram, FNP      . hydrOXYzine (ATARAX/VISTARIL) tablet 25 mg  25 mg Oral TID PRN Ethelene Hal, NP      . levothyroxine (SYNTHROID, LEVOTHROID) tablet 25 mcg  25 mcg Oral QAC  breakfast Ethelene Hal, NP   25 mcg at 04/17/17 0606  . loratadine (CLARITIN) tablet 10 mg  10 mg Oral Daily Ethelene Hal, NP   10 mg at 04/17/17 1941  . magnesium hydroxide (MILK OF MAGNESIA) suspension 30 mL  30 mL Oral Daily PRN Ethelene Hal, NP      . montelukast (SINGULAIR) tablet 10 mg  10 mg Oral QHS Ethelene Hal, NP   10 mg at 04/16/17 2105  . paliperidone (INVEGA) 24 hr tablet 3 mg  3 mg Oral QHS Ethelene Hal, NP   3 mg at 04/16/17 2105  . pantoprazole (PROTONIX) EC tablet 40 mg  40 mg Oral BID Ethelene Hal, NP  40 mg at 04/17/17 0742  . potassium chloride (K-DUR,KLOR-CON) CR tablet 10 mEq  10 mEq Oral Daily Ethelene Hal, NP   10 mEq at 04/17/17 6599  . prazosin (MINIPRESS) capsule 2 mg  2 mg Oral QHS Ethelene Hal, NP   2 mg at 04/15/17 2132  . topiramate (TOPAMAX) tablet 100 mg  100 mg Oral Daily Ethelene Hal, NP   100 mg at 04/17/17 3570  . traZODone (DESYREL) tablet 50 mg  50 mg Oral QHS PRN Ethelene Hal, NP   50 mg at 04/15/17 2132  . zonisamide (ZONEGRAN) capsule 300 mg  300 mg Oral QHS Ethelene Hal, NP   300 mg at 04/16/17 2105    Lab Results:  Results for orders placed or performed during the hospital encounter of 04/15/17 (from the past 48 hour(s))  CBC with Differential/Platelet     Status: Abnormal   Collection Time: 04/17/17  6:44 AM  Result Value Ref Range   WBC 12.3 (H) 4.0 - 10.5 K/uL   RBC 4.72 3.87 - 5.11 MIL/uL   Hemoglobin 14.1 12.0 - 15.0 g/dL   HCT 42.2 36.0 - 46.0 %   MCV 89.4 78.0 - 100.0 fL   MCH 29.9 26.0 - 34.0 pg   MCHC 33.4 30.0 - 36.0 g/dL   RDW 13.7 11.5 - 15.5 %   Platelets 225 150 - 400 K/uL   Neutrophils Relative % 56 %   Neutro Abs 6.9 1.7 - 7.7 K/uL   Lymphocytes Relative 32 %   Lymphs Abs 3.9 0.7 - 4.0 K/uL   Monocytes Relative 11 %   Monocytes Absolute 1.4 (H) 0.1 - 1.0 K/uL   Eosinophils Relative 1 %   Eosinophils Absolute 0.1 0.0 - 0.7  K/uL   Basophils Relative 0 %   Basophils Absolute 0.0 0.0 - 0.1 K/uL    Comment: Performed at [facility], [place] [address] [place], [place] [address]  Comprehensive metabolic panel     Status: None   Collection Time: 04/17/17  6:44 AM  Result Value Ref Range   Sodium 140 135 - 145 mmol/L   Potassium 3.7 3.5 - 5.1 mmol/L   Chloride 105 101 - 111 mmol/L   CO2 23 22 - 32 mmol/L   Glucose, Bld 98 65 - 99 mg/dL   BUN 12 6 - 20 mg/dL   Creatinine, Ser 0.64 0.44 - 1.00 mg/dL   Calcium 9.2 8.9 - 10.3 mg/dL   Total Protein 7.1 6.5 - 8.1 g/dL   Albumin 3.6 3.5 - 5.0 g/dL   AST 20 15 - 41 U/L   ALT 25 14 - 54 U/L   Alkaline Phosphatase 92 38 - 126 U/L   Total Bilirubin 0.4 0.3 - 1.2 mg/dL   GFR calc non Af Amer >60 >60 mL/min   GFR calc Af Amer >60 >60 mL/min    Comment: (NOTE) The eGFR has been calculated using the CKD EPI equation. This calculation has not been validated in all clinical situations. eGFR's persistently <60 mL/min signify possible Chronic Kidney Disease.    Anion gap 12 5 - 15    Comment: Performed at [facility], [place] [address], [place], [place] [address]  Lipid panel     Status: None   Collection Time: 04/17/17  6:44 AM  Result Value Ref Range   Cholesterol 155 0 - 200 mg/dL   Triglycerides 135 <150 mg/dL   HDL 46 >40 mg/dL  Total CHOL/HDL Ratio 3.4 RATIO   VLDL 27 0 - 40 mg/dL   LDL Cholesterol 82 0 - 99 mg/dL    Comment:        Total Cholesterol/HDL:CHD Risk Coronary Heart Disease Risk Table                     Men   Women  1/2 Average Risk   3.4   3.3  Average Risk       5.0   4.4  2 X Average Risk   9.6   7.1  3 X Average Risk  23.4   11.0        Use the calculated Patient Ratio above and the CHD Risk Table to determine the patient's CHD Risk.        ATP III CLASSIFICATION (LDL):  <100     mg/dL   Optimal  100-129  mg/dL   Near or Above                    Optimal  130-159  mg/dL   Borderline  160-189   mg/dL   High  >190     mg/dL   Very High Performed at [place] [address] [place], [place] [address]   TSH     Status: Abnormal   Collection Time: 04/17/17  6:44 AM  Result Value Ref Range   TSH 5.514 (H) 0.350 - 4.500 uIU/mL    Comment: Performed by a 3rd Generation assay with a functional sensitivity of <=0.01 uIU/mL. Performed at [facility], [place] [address], [place], [place] [address]     Blood Alcohol level:  Lab Results  Component Value Date   [facility]  06/24/2008    <5        LOWEST DETECTABLE LIMIT FOR SERUM ALCOHOL IS 5 mg/dL FOR MEDICAL PURPOSES ONLY    Metabolic Disorder Labs: Lab Results  Component Value Date   HGBA1C  02/25/2008    5.1 (NOTE)   The ADA recommends the following therapeutic goal for glycemic   control related to Hgb A1C measurement:   Goal of Therapy:   < 7.0% Hgb A1C   Reference: American Diabetes Association: Clinical Practice   Recommendations 2008, Diabetes Care,  2008, 31:(Suppl 1).   MPG 100 02/25/2008   No results found for: PROLACTIN Lab Results  Component Value Date   CHOL 155 04/17/2017   TRIG 135 04/17/2017   HDL 46 04/17/2017   CHOLHDL 3.4 04/17/2017   VLDL 27 04/17/2017   LDLCALC 82 04/17/2017   LDLCALC 93 03/01/2012    Physical Findings: AIMS: Facial and Oral Movements Muscles of Facial Expression: None, normal Lips and Perioral Area: None, normal Jaw: None, normal Tongue: None, normal,Extremity Movements Upper (arms, wrists, hands, fingers): None, normal Lower (legs, knees, ankles, toes): None, normal, Trunk Movements Neck, shoulders, hips: None, normal, Overall Severity Severity of abnormal movements (highest score from questions above): None, normal Incapacitation due to abnormal movements: None, normal Patient's awareness of abnormal movements (rate only patient's report): No Awareness, Dental Status Current problems with teeth and/or dentures?: No Does patient usually  wear dentures?: No  CIWA:    COWS:     Musculoskeletal: Strength & Muscle Tone: within normal limits Gait & Station: normal Patient leans: N/A  Psychiatric Specialty Exam: Physical Exam  Nursing note and vitals reviewed. Constitutional: She is oriented to person, place, and time.  Morbidly obese  Respiratory: Effort normal.  Musculoskeletal: Normal range of motion.  Neurological: She is alert and oriented to person, place, and time.  Skin: Skin is warm.    Review of Systems  Constitutional: Negative.   HENT: Negative.   Eyes: Negative.   Respiratory: Negative.   Cardiovascular: Negative.   Gastrointestinal: Negative.   Genitourinary: Negative.   Musculoskeletal: Negative.   Skin: Negative.   Neurological: Negative.   Endo/Heme/Allergies: Negative.   Psychiatric/Behavioral: Positive for depression and hallucinations. Negative for suicidal ideas.    Blood pressure (!) 96/47, pulse (!) 101, temperature 97.8 F (36.6 C), temperature source Oral, resp. rate 18, height '5\' 3"'  (1.6 m), weight (!) 201.9 kg (445 lb).Body mass index is 78.83 kg/m.  General Appearance: Casual  Eye Contact:  Good  Speech:  Clear and Coherent and Normal Rate  Volume:  Normal  Mood:  Euthymic  Affect:  Non-Congruent  Thought Process:  Goal Directed and Descriptions of Associations: Circumstantial  Orientation:  Full (Time, Place, and Person)  Thought Content:  Hallucinations: Auditory chronic  Suicidal Thoughts:  No  Homicidal Thoughts:  No  Memory:  Immediate;   Good Recent;   Good Remote;   Good  Judgement:  Fair  Insight:  Fair  Psychomotor Activity:  Normal  Concentration:  Concentration: Good and Attention Span: Good  Recall:  Good  Fund of Knowledge:  Good  Language:  Good  Akathisia:  No  Handed:  Right  AIMS (if indicated):     Assets:  Communication Skills Desire for Improvement Financial Resources/Insurance Housing Physical Health Social Support Transportation  ADL's:   Intact  Cognition:  WNL  Sleep:  Number of Hours: 6(time change affected)   Problems Addressed: Hypotension Borderline Personality Bipolar I PTSD  Treatment Plan Summary: Daily contact with patient to assess and evaluate symptoms and progress in treatment, Medication management and Plan is to:  -Stop Lasix -Stop Coreg -Decrease Vraylar 1.5 mg Daily and continue to taper off -Decrease Depakote 500 mg Daily and continue to taper off -Continue Invega 3 mg PO Daily for mood stability -Continue trying to contact ACTT -Continue Topamax 100 mg PO Daily -Continue Trazodone 50 mg PO QHS for insomnia -Encourage group therapy participation  Lewis Shock, FNP 04/17/2017, 12:58 PM  Agree with NP Progress Note

## 2017-04-17 NOTE — Plan of Care (Signed)
  Safety: Periods of time without injury will increase 04/17/2017 2147 - Progressing by Delos HaringPhillips, Lakresha Stifter A, RN Note Pt safe on the unit at this time

## 2017-04-17 NOTE — Progress Notes (Signed)
D: Pt SI-contracts for safety, AH- command, denies VH/ HI. Pt stated she has been feeling like she has been zoning out since she started the Depakote before coming in here. Pt said she feels about the same as coming in here.   A: Pt was offered support and encouragement. Pt was given scheduled medications. Pt was encourage to attend groups. Q 15 minute checks were done for safety.   R:Pt attends groups and interacts well with peers and staff. Pt is taking medication. Pt receptive to treatment and safety maintained on unit.

## 2017-04-17 NOTE — Progress Notes (Signed)
D patient is observed at the nurses' station early this morning and she says " My BP was really low this morning so I hope I don't get sick today". A Pt's BP per night shift report 92/55 at 0700. Am dose of corguard held and NP dc'd this and also lasix. PM BP checked manually by this writer at 1600 123/63. Pt without complaints and denies discomfort. R Safety in place.

## 2017-04-17 NOTE — [facility] Group Notes (Signed)
Orientation / Goals   Date:  04/17/2017  Time:  10:02 AM  Type of Therapy:  Nurse Education  /  The group focuses on teaching  Patients who their staff is, what the staff responsinbilities are and what the unit programming looks like.  Participation Level:  Active  Participation Quality:  Appropriate  Affect:  Appropriate  Cognitive:  Alert  Insight:  Appropriate  Engagement in Group:  Engaged  Modes of Intervention:  Education  Summary of Progress/Problems:  Rich BraveDuke, Shanise Balch Lynn 04/17/2017, 10:02 AM

## 2017-04-17 NOTE — [facility] Group Notes (Signed)
Adult Psychoeducational Group Note  Date:  04/17/2017 Time:  8:55 PM  Group Topic/Focus:  Wrap-Up Group:   The focus of this group is to help patients review their daily goal of treatment and discuss progress on daily workbooks.  Participation Level:  Minimal  Participation Quality:  Appropriate  Affect:  Depressed and Flat  Cognitive:  Alert and Appropriate  Insight: Appropriate and Good  Engagement in Group:  Lacking  Modes of Intervention:  Discussion and Education  Additional Comments:  Pt attended and participated in wrap up group this evening. Pt had a "terrible" day, due to them being emotional all day. Pt told writer that they have been "zoning out" a lot and they are "not there" all the way. Pt goal was to have a good visit if anyone came, but they did not. A positive noted by the pt was that they talked to staff about what was bothering them.   Misty NettersOctavia A Ellowyn [name] 04/17/2017, 8:55 PM

## 2017-04-18 DIAGNOSIS — F319 Bipolar disorder, unspecified: Secondary | ICD-10-CM

## 2017-04-18 MED ORDER — ZONISAMIDE 100 MG PO CAPS
200.0000 mg | ORAL_CAPSULE | Freq: Every day | ORAL | Status: DC
  Administered 2017-04-18: 200 mg via ORAL
  Filled 2017-04-18 (×3): qty 2

## 2017-04-18 MED ORDER — HALOPERIDOL 5 MG PO TABS
5.0000 mg | ORAL_TABLET | Freq: Four times a day (QID) | ORAL | Status: DC | PRN
  Administered 2017-04-18 – 2017-04-24 (×9): 5 mg via ORAL
  Filled 2017-04-18 (×9): qty 1

## 2017-04-18 MED ORDER — CARVEDILOL 25 MG PO TABS: 25.0000 mg | ORAL_TABLET | Freq: Two times a day (BID) | ORAL | Status: DC

## 2017-04-18 MED ORDER — HALOPERIDOL 5 MG PO TABS: 5.0000 mg | ORAL_TABLET | Freq: Four times a day (QID) | ORAL | Status: DC | PRN

## 2017-04-18 MED ORDER — CARVEDILOL 25 MG PO TABS
25.0000 mg | ORAL_TABLET | Freq: Every day | ORAL | Status: DC
  Administered 2017-04-18 – 2017-04-25 (×8): 25 mg via ORAL
  Filled 2017-04-18 (×2): qty 1
  Filled 2017-04-18: qty 2
  Filled 2017-04-18: qty 1
  Filled 2017-04-18: qty 2
  Filled 2017-04-18 (×7): qty 1

## 2017-04-18 MED ORDER — PALIPERIDONE ER 6 MG PO TB24
6.0000 mg | ORAL_TABLET | Freq: Every day | ORAL | Status: DC
  Administered 2017-04-18 – 2017-04-19 (×2): 6 mg via ORAL
  Filled 2017-04-18 (×4): qty 1

## 2017-04-18 MED ORDER — HYDROXYZINE HCL 50 MG PO TABS
50.0000 mg | ORAL_TABLET | Freq: Once | ORAL | Status: AC
  Administered 2017-04-18: 50 mg via ORAL
  Filled 2017-04-18 (×2): qty 1

## 2017-04-18 MED ORDER — TOPIRAMATE 25 MG PO TABS
50.0000 mg | ORAL_TABLET | Freq: Every day | ORAL | Status: DC
  Administered 2017-04-19: 50 mg via ORAL
  Filled 2017-04-18 (×3): qty 2

## 2017-04-18 MED ORDER — FLUOXETINE HCL 10 MG PO CAPS
10.0000 mg | ORAL_CAPSULE | Freq: Every day | ORAL | Status: DC
  Administered 2017-04-18 – 2017-04-20 (×3): 10 mg via ORAL
  Filled 2017-04-18 (×6): qty 1

## 2017-04-18 NOTE — [facility] Group Notes (Signed)
Adult Psychoeducational Group Note  Date:  04/18/2017 Time:  9:08 PM  Group Topic/Focus:  Wrap-Up Group:   The focus of this group is to help patients review their daily goal of treatment and discuss progress on daily workbooks.  Participation Level:  Active  Participation Quality:  Appropriate and Attentive  Affect:  Appropriate  Cognitive:  Alert and Appropriate  Insight: Appropriate and Good  Engagement in Group:  Engaged  Modes of Intervention:  Discussion and Education  Additional Comments:  Pt attended and participated in wrap up group this evening. Pt had a similar day that they had yesterday, and being very tearful, but they are having some changes being made to their meds. Pt goal was to speak with the Dr., in which they had a productive visit.   Misty NettersOctavia A Mahir [name] 04/18/2017, 9:08 PM

## 2017-04-18 NOTE — Progress Notes (Signed)
Pt was moved to the quiet room, but pt was allowed to watch TV on the 500 hall dayroom. Pt continued to present as if she was in no danger. Pt asked for Aroma therapy, pt informed no one was trained working tonight to do it for her and was asked to try the day shift. Pt continues to present manipulative and needy this evening. Pt is kept where she has constant eyes at all times , due to pt stating she can't contract for safety and requesting 1:1. Pt was informed that we could keep her safe this evening without having to place her on a 1:1.

## 2017-04-18 NOTE — Plan of Care (Signed)
  Safety: Periods of time without injury will increase 04/18/2017 2230 - Progressing by Delos HaringPhillips, Dominique Calvey A, RN Note Pt safe on the unit at this time, pt kept in dayroom and moved to the quiet room for continued observation due to pt not being able to contract for safety   Coping: Ability to cope will improve 04/18/2017 2230 - Not Progressing by Delos HaringPhillips, Lachae Hohler A, RN Note Pt stated she could not contract for safety, pt remains safe at this time

## 2017-04-18 NOTE — Progress Notes (Addendum)
D: Pt SI-can't contract for safety, pt stated the AH loud and she may act on them. Pt was placed in the dayroom to keep pt visible . Pt was informed that she would have to go to the quiet room where we could keep an eye on her with the camera.   A: Pt was offered support and encouragement.  Pt was encourage to attend groups. Q 15 minute checks were done for safety.   R:Pt attends groups  Pt is taking medication. Pt has no complaints. safety maintained on unit.

## 2017-04-18 NOTE — Consult Note (Addendum)
[facility] consult   Chart reviewed, case discussed with Dr Jama Flavorsobos. 22 y/o with HTN, Morbid obesity and severe psych hx [facility] is now with tachycardia. Patient was hypotensive yesterday 3/10 and her antihypertensive medications were discontinued which include Coreg 25 mg BID, Lisinopril 10 mg daily and Lasix 40 mg daily.   Per psych report patient c/o mild leg discomfort, she think that they are more swollen. Per MD exam no really difference from day before. No other symptoms.   Patient is on other medications that can decrease BP beside her antihypertensives; prazosin and paliperidone.   Main issue now is tachycardia and leg discomfort. Recommendations, resume coreg as she can be having rebound tachycardia. I would start low dose 25 mg daily for now, since she continues to be prazosin and paliperidone. Monitor BP and increase dose back to home regimen as BP allows. Continue to hold lisinopril for now. For her "LE edema" resume Lasix in AM if BP remains stable.   Latrelle DodrillEdwin Silva, MD

## 2017-04-18 NOTE — Tx Team (Signed)
Interdisciplinary Treatment and Diagnostic Plan Update  04/18/2017 Time of Session: 10:40am Misty [name] MRN: [id number]  Principal Diagnosis: Bipolar I disorder ([place])  Secondary Diagnoses: Principal Problem:   Bipolar I disorder ([place]) Active Problems:   Borderline personality disorder ([place])   Posttraumatic stress disorder   Current Medications:  Current Facility-Administered Medications  Medication Dose Route Frequency Provider Last Rate Last Dose  . acetaminophen (TYLENOL) tablet 650 mg  650 mg Oral Q6H PRN Ethelene Hal, NP   650 mg at 04/17/17 1111  . albuterol (PROVENTIL HFA;VENTOLIN HFA) 108 (90 Base) MCG/ACT inhaler 2 puff  2 puff Inhalation Q4H PRN Ethelene Hal, NP   2 puff at 04/16/17 1837  . alum & mag hydroxide-simeth (MAALOX/MYLANTA) 200-200-20 MG/5ML suspension 30 mL  30 mL Oral Q4H PRN Ethelene Hal, NP      . cariprazine (VRAYLAR) capsule 1.5 mg  1.5 mg Oral Daily Money, Lowry Ram, FNP   1.5 mg at 04/18/17 0747  . divalproex (DEPAKOTE ER) 24 hr tablet 500 mg  500 mg Oral Daily Money, Lowry Ram, FNP   500 mg at 04/18/17 0747  . hydrOXYzine (ATARAX/VISTARIL) tablet 25 mg  25 mg Oral TID PRN Ethelene Hal, NP      . levothyroxine (SYNTHROID, LEVOTHROID) tablet 25 mcg  25 mcg Oral QAC breakfast Ethelene Hal, NP   25 mcg at 04/18/17 [phone]  . loperamide (IMODIUM) capsule 2 mg  2 mg Oral PRN Lindon Romp A, NP      . loratadine (CLARITIN) tablet 10 mg  10 mg Oral Daily Ethelene Hal, NP   10 mg at 04/18/17 0748  . magnesium hydroxide (MILK OF MAGNESIA) suspension 30 mL  30 mL Oral Daily PRN Ethelene Hal, NP      . montelukast (SINGULAIR) tablet 10 mg  10 mg Oral QHS Ethelene Hal, NP   10 mg at 04/17/17 2200  . paliperidone (INVEGA) 24 hr tablet 3 mg  3 mg Oral QHS Ethelene Hal, NP   3 mg at 04/17/17 2200  . pantoprazole (PROTONIX) EC tablet 40 mg  40 mg Oral BID Ethelene Hal, NP   40 mg at 04/18/17  0747  . potassium chloride (K-DUR,KLOR-CON) CR tablet 10 mEq  10 mEq Oral Daily Ethelene Hal, NP   10 mEq at 04/18/17 0747  . prazosin (MINIPRESS) capsule 2 mg  2 mg Oral QHS Ethelene Hal, NP   2 mg at 04/17/17 2200  . topiramate (TOPAMAX) tablet 100 mg  100 mg Oral Daily Ethelene Hal, NP   100 mg at 04/18/17 0747  . traZODone (DESYREL) tablet 50 mg  50 mg Oral QHS PRN Ethelene Hal, NP   50 mg at 04/17/17 2200  . zonisamide (ZONEGRAN) capsule 300 mg  300 mg Oral QHS Ethelene Hal, NP   300 mg at 04/17/17 2200   PTA Medications: Facility-Administered Medications Prior to Admission  Medication Dose Route Frequency Provider Last Rate Last Dose  . Mepolizumab SOLR 100 mg  100 mg Subcutaneous Q28 days Jiles Prows, MD   100 mg at 04/13/17 1335   Medications Prior to Admission  Medication Sig Dispense Refill Last Dose  . albuterol (PROVENTIL HFA) 108 (90 Base) MCG/ACT inhaler Inhale 2 puffs into the lungs every 4 (four) hours as needed.     . carvedilol (COREG) 25 MG tablet Take 25 mg by mouth 2 (two) times daily.     . divalproex (  DEPAKOTE ER) 250 MG 24 hr tablet Take 250 mg by mouth at bedtime.     Marland Kitchen EPINEPHrine 0.3 mg/0.3 mL IJ SOAJ injection Inject 0.3 mg into the muscle daily as needed.     . ferrous sulfate (FEROSUL) 325 (65 FE) MG tablet Take 325 mg by mouth daily.     . fexofenadine (ALLEGRA) 180 MG tablet Take 180 mg by mouth at bedtime.     . furosemide (LASIX) 40 MG tablet Take 40 mg by mouth every morning.     . Guselkumab (TREMFYA) 100 MG/ML SOSY Inject 100 mg into the skin every 8 (eight) weeks.     Marland Kitchen lisinopril (PRINIVIL,ZESTRIL) 10 MG tablet Take 10 mg by mouth daily.     . montelukast (SINGULAIR) 10 MG tablet Take 10 mg by mouth at bedtime.     . paliperidone (INVEGA) 6 MG 24 hr tablet Take 6 mg by mouth 2 (two) times daily.     . potassium chloride (K-DUR) 10 MEQ tablet Take 10 mEq by mouth daily.     . prazosin (MINIPRESS) 2 MG  capsule Take 2 mg by mouth at bedtime.     . progesterone (PROMETRIUM) 200 MG capsule Take 200 mg by mouth at bedtime.     Marland Kitchen albuterol (PROVENTIL HFA) 108 (90 Base) MCG/ACT inhaler Inhale two puffs every four to six hours as needed for cough or wheeze. 1 Inhaler 1 Taking  . albuterol (PROVENTIL) (2.5 MG/3ML) 0.083% nebulizer solution Inhale 2.5 mg into the lungs 3 (three) times daily.     Marland Kitchen azelastine (ASTELIN) 0.1 % nasal spray Can use one to two sprays in each nostril one to two times daily as needed. 30 mL 5 Taking  . azelastine (OPTIVAR) 0.05 % ophthalmic solution Place 1 drop into both eyes 2 (two) times daily. 6 mL 3 Taking  . buPROPion (WELLBUTRIN XL) 300 MG 24 hr tablet Take 1 tablet (300 mg total) by mouth daily. 90 tablet 0 Taking  . busPIRone (BUSPAR) 15 MG tablet Take 15 mg by mouth 2 (two) times daily.  0 Taking  . busPIRone (BUSPAR) 15 MG tablet Take 15 mg by mouth 2 (two) times daily.     . carvedilol (COREG) 25 MG tablet TAKE ONE TABLET BY MOUTH TWICE DAILY WITH meals 180 tablet 1   . Cholecalciferol (VITAMIN D) 2000 units tablet Take 2,000 Units by mouth daily.   Taking  . divalproex (DEPAKOTE ER) 250 MG 24 hr tablet TAKE ONE TABLET BY MOUTH AT BEDTIME FOR 7 DAYS, THEN TAKE TWO TABLETS AT BEDTIME  6 Taking  . EPINEPHrine 0.3 mg/0.3 mL IJ SOAJ injection Use as directed for life threatening allergic reaction 2 Device 3 Taking  . ferrous sulfate 325 (65 FE) MG tablet See admin instructions.  99 Taking  . fexofenadine (ALLEGRA) 180 MG tablet TAKE ONE TABLET BY MOUTH DAILY 90 tablet 0   . furosemide (LASIX) 40 MG tablet Take 1 tablet (40 mg total) by mouth 2 (two) times daily. 60 tablet 3   . ipratropium-albuterol (DUONEB) 0.5-2.5 (3) MG/3ML SOLN Take 3 mLs by nebulization every 6 (six) hours as needed.   Taking  . ipratropium-albuterol (DUONEB) 0.5-2.5 (3) MG/3ML SOLN Inhale 3 mLs into the lungs every 6 (six) hours as needed.     . lamoTRIgine (LAMICTAL) 25 MG tablet Take 2 tablets (50  mg total) by mouth 2 (two) times daily. 60 tablet 0 Taking  . lisinopril (PRINIVIL,ZESTRIL) 10 MG tablet TAKE ONE TABLET BY  MOUTH EVERY DAY 90 tablet 1   . lisinopril (PRINIVIL,ZESTRIL) 10 MG tablet Take 10 mg by mouth daily.     . mometasone-formoterol (DULERA) 200-5 MCG/ACT AERO Inhale two puffs twice a day to prevent cough or wheeze.  Rinse, gargle, and spit after use. 1 Inhaler 0   . mometasone-formoterol (DULERA) 200-5 MCG/ACT AERO Inhale 2 puffs into the lungs 2 (two) times daily.     . montelukast (SINGULAIR) 10 MG tablet Take 10 mg at bedtime by mouth.  5 Taking  . MULTIPLE MINERALS-VITAMINS PO Take 1 tablet by mouth daily.     . Multiple Vitamin (MULTIVITAMIN WITH MINERALS) TABS tablet Take 1 tablet by mouth daily.   Taking  . Olopatadine HCl (PATADAY) 0.2 % SOLN Place 1 drop into both eyes 1 day or 1 dose. 1 Bottle 5 Taking  . omeprazole (PRILOSEC) 40 MG capsule Take one capsule by mouth twice a day. 60 capsule 5 Taking  . omeprazole (PRILOSEC) 40 MG capsule Take 40 mg by mouth 2 (two) times daily.     . paliperidone (INVEGA) 6 MG 24 hr tablet Take 6 mg 2 (two) times daily by mouth.   0 Taking  . potassium chloride 20 MEQ TBCR Take 1/2 tablet by mouth daily. 30 tablet 11   . prazosin (MINIPRESS) 1 MG capsule Take 2 mg by mouth at bedtime.   Taking  . progesterone (PROMETRIUM) 200 MG capsule Take 200 mg by mouth at bedtime.  1 Taking  . ranitidine (ZANTAC) 300 MG tablet Take 1 tablet (300 mg total) by mouth at bedtime. 90 tablet 1 Taking  . ranitidine (ZANTAC) 300 MG tablet Take 300 mg by mouth at bedtime.     . Tiotropium Bromide Monohydrate (SPIRIVA RESPIMAT IN) Inhale into the lungs.     . topiramate (TOPAMAX) 100 MG tablet Take 1 tablet daily by mouth.   Taking  . topiramate (TOPAMAX) 25 MG tablet Take 100 mg by mouth daily.     Marland Kitchen TREMFYA 100 MG/ML SOSY INJECT ONE syringe Subcutaneously every EIGHT WEEKS (AFTER starter doses at WEEKS 0 and 4)  3 Taking  . VIBERZI 100 MG TABS Take 1  tablet by mouth 2 (two) times daily.  5 Taking  . Vitamin D, Ergocalciferol, (DRISDOL) 50000 units CAPS capsule TAKE ONE CAPSULE BY MOUTH ONCE WEEKLY FOR EIGHT WEEKS  3 Taking  . VRAYLAR 4.5 MG CAPS Take 1 capsule by mouth daily.  1   . zonisamide (ZONEGRAN) 100 MG capsule Take 3 tablets by mouth at bedtime  6 Taking  . zonisamide (ZONEGRAN) 25 MG capsule Take 300 mg by mouth at bedtime.       Patient Stressors: Health problems Marital or family conflict Medication change or noncompliance Traumatic event  Patient Strengths: Curator fund of knowledge Motivation for treatment/growth  Treatment Modalities: Medication Management, Group therapy, Case management,  1 to 1 session with clinician, Psychoeducation, Recreational therapy.   Physician Treatment Plan for Primary Diagnosis: Bipolar I disorder ([place]) Long Term Goal(s): Improvement in symptoms so as ready for discharge Improvement in symptoms so as ready for discharge   Short Term Goals: Ability to verbalize feelings will improve Ability to disclose and discuss suicidal ideas Ability to demonstrate self-control will improve Ability to identify and develop effective coping behaviors will improve Ability to maintain clinical measurements within normal limits will improve Compliance with prescribed medications will improve  Medication Management: Evaluate patient's response, side effects, and tolerance of medication regimen.  Therapeutic Interventions:  1 to 1 sessions, Unit Group sessions and Medication administration.  Evaluation of Outcomes: Not Met  Physician Treatment Plan for Secondary Diagnosis: Principal Problem:   Bipolar I disorder ([place]) Active Problems:   Borderline personality disorder ([place])   Posttraumatic stress disorder  Long Term Goal(s): Improvement in symptoms so as ready for discharge Improvement in symptoms so as ready for discharge   Short Term Goals: Ability to verbalize feelings  will improve Ability to disclose and discuss suicidal ideas Ability to demonstrate self-control will improve Ability to identify and develop effective coping behaviors will improve Ability to maintain clinical measurements within normal limits will improve Compliance with prescribed medications will improve     Medication Management: Evaluate patient's response, side effects, and tolerance of medication regimen.  Therapeutic Interventions: 1 to 1 sessions, Unit Group sessions and Medication administration.  Evaluation of Outcomes: Not Met   RN Treatment Plan for Primary Diagnosis: Bipolar I disorder ([place]) Long Term Goal(s): Knowledge of disease and therapeutic regimen to maintain health will improve  Short Term Goals: Ability to remain free from injury will improve, Ability to verbalize frustration and anger appropriately will improve, Ability to demonstrate self-control, Ability to participate in decision making will improve, Ability to verbalize feelings will improve, Ability to disclose and discuss suicidal ideas, Ability to identify and develop effective coping behaviors will improve and Compliance with prescribed medications will improve  Medication Management: RN will administer medications as ordered by provider, will assess and evaluate patient's response and provide education to patient for prescribed medication. RN will report any adverse and/or side effects to prescribing provider.  Therapeutic Interventions: 1 on 1 counseling sessions, Psychoeducation, Medication administration, Evaluate responses to treatment, Monitor vital signs and CBGs as ordered, Perform/monitor CIWA, COWS, AIMS and Fall Risk screenings as ordered, Perform wound care treatments as ordered.  Evaluation of Outcomes: Not Met   LCSW Treatment Plan for Primary Diagnosis: Bipolar I disorder ([place]) Long Term Goal(s): Safe transition to appropriate next level of care at discharge, Engage patient in therapeutic  group addressing interpersonal concerns.  Short Term Goals: Engage patient in aftercare planning with referrals and resources, Increase social support, Increase ability to appropriately verbalize feelings, Increase emotional regulation, Facilitate acceptance of mental health diagnosis and concerns, Facilitate patient progression through stages of change regarding substance use diagnoses and concerns, Identify triggers associated with mental health/substance abuse issues and Increase skills for wellness and recovery  Therapeutic Interventions: Assess for all discharge needs, 1 to 1 time with Social worker, Explore available resources and support systems, Assess for adequacy in community support network, Educate family and significant other(s) on suicide prevention, Complete Psychosocial Assessment, Interpersonal group therapy.  Evaluation of Outcomes: Not Met   Progress in Treatment: Attending groups: Yes. Participating in groups: Yes. Taking medication as prescribed: Yes. Toleration medication: Yes. Family/Significant other contact made: No, will contact:  the patient's mother, Shaunda Tipping Patient understands diagnosis: Yes.  Discussing patient identified problems/goals with staff: Yes. Medical problems stabilized or resolved: Yes. Denies suicidal/homicidal ideation: No. Issues/concerns per patient self-inventory: No. Other:   New problem(s) identified: NONE  New Short Term/Long Term Goal(s): medication stabilization, elimination of SI thoughts, development of comprehensive mental wellness plan.   Patient Goals: "I want to go to a group home at discharge due to family conflict at home. I also want to get on the right medications and learn more coping skills"  Discharge Plan or Barriers: CSW will continue to assess for an appropriate discharge plan regarding the patient's living  arrangement. The patient will continue to follow up with her [facility] team for medication management and  therapy services. Patient also has a PCP Nyra Capes and [facility].   Reason for Continuation of Hospitalization: Anxiety Depression Medication stabilization Suicidal ideation  Estimated Length of Stay: Friday, 04/22/17  Attendees: Patient: Misty BarnardMadison" [name] 04/18/2017 8:26 AM  Physician: Dr. Neita Garnet 04/18/2017 8:26 AM  Nursing: Mary Sella, RN 04/18/2017 8:26 AM  RN Care Manager: 04/18/2017 8:26 AM  Social Worker: Radonna Ricker, [place]  04/18/2017 8:26 AM  Recreational Therapist:  04/18/2017 8:26 AM  Other:  04/18/2017 8:26 AM  Other:  04/18/2017 8:26 AM  Other: 04/18/2017 8:26 AM    Scribe for Treatment Team: Marylee Floras, [place] 04/18/2017 8:26 AM

## 2017-04-18 NOTE — Progress Notes (Signed)
Pt keeps asking staff about being on a 1:1 , pt informed our job is to keep pt safe.

## 2017-04-18 NOTE — [facility] Suicide Risk Assessment (Signed)
[facility] INPATIENT:  Family/Significant Other Suicide Prevention Education  Suicide Prevention Education:  Education Completed; Crisoforo OxfordKristal Sanders, mother, [phone], has been identified by the patient as the family member/significant other with whom the patient will be residing, and identified as the person(s) who will aid the patient in the event of a mental health crisis (suicidal ideations/suicide attempt).  With written consent from the patient, the family member/significant other has been provided the following suicide prevention education, prior to the and/or following the discharge of the patient.  The suicide prevention education provided includes the following:  Suicide risk factors  Suicide prevention and interventions  National Suicide Hotline telephone number  [facility] assessment telephone number  [facility] Assistance 911  [facility] and/or [facility] telephone number  Request made of family/significant other to:  Remove weapons (e.g., guns, rifles, knives), all items previously/currently identified as safety concern.  Family does have guns but they are all locked in a cabinet.  Pt does not have access.  Remove drugs/medications (over-the-counter, prescriptions, illicit drugs), all items previously/currently identified as a safety concern. Adella NissenKristal did agree to look over medication and get rid of meds that are no longer being used.  There have been med changes recently so they do have some.  The family member/significant other verbalizes understanding of the suicide prevention education information provided.  The family member/significant other agrees to remove the items of safety concern listed above.  Adella NissenKristal reports pt meds "not helping at all."  Pt mood is up and down all day and pt was just in the hospital a month ago, too.  Pt has been with ACT team for 2 years.  First year went pretty well.  Second year has seen several  different MDs on the case and has gone poorly--pt was in the hospital due to lithium toxicity from one of the MDs.  Pt has been talking about a different living situation for a while.  First wanted own apartment but neither [place] nor ACT team think that would work.  Then group home came up.  Pt has been in group homes when she was a child.  Tamela/family is not asking her to leave and go to a group home.  They would be open to it if the found a good situation, but this is more pt request.    Lorri FrederickWierda, Sabirin Baray Jon, LCSW 04/18/2017, 10:59 AM

## 2017-04-18 NOTE — Progress Notes (Signed)
Recreation Therapy Notes   Date:3.11.19 Time:9:30 a.m. Location: [address] Dayroom  Group Topic: Stress Management  Goal Area(s) Addresses:  Goal 1.1: To reduce stress  -Patient will report feeling a reduction in stress level  -Patient will identify the importance of stress management  -Patient will participate during stress management group treatment    Behavioral Response:Patient did not attend   Sheryle Hailarian Jakaylee Sasaki, Recreation Therapy Intern   Sheryle HailDarian Taneisha Fuson 04/18/2017 11:15 AM

## 2017-04-18 NOTE — Progress Notes (Signed)
[facility] MD Progress Note  04/18/2017 3:16 PM Misty [name]  MRN:  [id number]   Subjective: patient reports ongoing depression, sadness, and describes intermittent auditory hallucinations which tell her to " scratch herself ". She denies any self injurious behaviors or scratching, and currently denies suicidal or self injurious  plan or intention. She is future oriented and is wanting to go to a group home, rather than returning home , after discharge . She states she feels there is " too much arguing ", and emotional tension at home, and states she feels her brother teases her about her weight and learning disability, causing her to feel more depressed. As noted, patient reports she was on a significant amount of medications prior to admission ( see home medication list ), and expresses hope that her medication regimen can be simplified as part of admission goals.  Objective: Patient's chart and findings reviewed and discussed with treatment team.  Patient presents alert, attentive , cooperative. Presents depressed , with a constricted and intermittently tearful affect . Today denies suicidal plan or intention and contracts for safety on unit . With her express consent I have spoken with her mother via phone- mother corroborates long history of mood disorder, and states prior to admission patient was presenting with more emotional lability, depression, intermittent irritable outbursts . She states patient is managed by ACT team . Mother states patient is welcome to return home after discharge if she wants to but is aware that patient is expressing desire to move to a group home or similar , and states she respects her decision. " I just want her to make an informed decision about what will help her the most ". We reviewed medication list/management- patient had been on Vraylar, Invega, Depakote, Zonegran, Topamax ( states for weight loss /headaches ), but is interested in simplifying medication regimen. In  particular she complains of difficulty concentrating, subjective sense of cognitive slowing, feeling overmedicated, nauseous . She states she feels Depakote and Vraylar were not well tolerated.  We are in the process of tapering Depakote, Zonegran, Topamax doses down gradually, and have stopped Vraylar. We discussed antidepressant options - patient states she did not like Lamictal, she does remember having been on Prozac in the past but does not remember how she did , she does not remember having had side effects. On unit tends to be isolative, limited interactions with peers . Cooperative on approach.  Principal Problem: Bipolar I disorder ([place]) Diagnosis:   Patient Active Problem List   Diagnosis Date Noted  . Bipolar 1 disorder ([place]) [F31.9] 04/15/2017  . Swelling of both lower extremities [M79.89] 12/14/2016  . Lithium toxicity [T56.891A] 04/05/2016  . Fatty liver [K76.0] 10/10/2015  . Iron deficiency [E61.1] 10/10/2015  . Bipolar I disorder ([place]) [F31.9] 10/10/2015  . Palpitations [R00.2] 07/21/2015  . Morbid obesity ([place]) [E66.01] 04/03/2015  . Borderline personality disorder ([place]) [F60.3] 03/14/2015  . Allergic rhinitis [J30.9] 10/08/2014  . GERD (gastroesophageal reflux disease) [K21.9] 10/08/2014  . H/O ASTHMA [J45.909] 10/08/2014  . INTERMITTENT URTICARIA [Z87.2] 10/08/2014  . Posttraumatic stress disorder [F43.10] 09/04/2014  . Sinus tachycardia [R00.0] 08/07/2014  . Hypertension [I10] 05/16/2013  . Metabolic syndrome [P59.45] 09/04/2012  . GAD (generalized anxiety disorder) [F41.1] 07/19/2012  . ODD (oppositional defiant disorder) [F91.3] 07/19/2012  . Obstructive sleep apnea [G47.33] 07/19/2012  . MDD (major depressive disorder), recurrent episode, moderate ([place]) [F33.1] 03/01/2012   Total Time spent with patient: 30 minutes  Past Psychiatric History: See H&P  Past Medical  History:  Past Medical History:  Diagnosis Date  . Allergic rhinitis   . Anxiety   .  Depression   . GERD (gastroesophageal reflux disease)   . HTN (hypertension) 07/13/12  . IBS (irritable bowel syndrome)   . Obesity   . OSA on CPAP   . Psoriasis     Past Surgical History:  Procedure Laterality Date  . ADENOIDECTOMY    . SINOSCOPY    . TONSILLECTOMY AND ADENOIDECTOMY  Age 78   Deviated septum corrected at same time   Family History:  Family History  Problem Relation Age of Onset  . Depression Mother   . Allergic rhinitis Mother   . Depression Maternal Grandmother   . Diabetes Maternal Grandmother   . Hypertension Maternal Grandmother   . Bipolar disorder Father   . Drug abuse Father   . Hypertension Father    Family Psychiatric  History: See H&P Social History:  Social History   Substance and Sexual Activity  Alcohol Use No     Social History   Substance and Sexual Activity  Drug Use No    Social History   Socioeconomic History  . Marital status: Single    Spouse name: None  . Number of children: None  . Years of education: None  . Highest education level: None  Social Needs  . Financial resource strain: None  . Food insecurity - worry: None  . Food insecurity - inability: None  . Transportation needs - medical: None  . Transportation needs - non-medical: None  Occupational History  . Occupation: STUDENT    Employer: [employer]    Comment: 11th grade [employer]  Tobacco Use  . Smoking status: Never Smoker  . Smokeless tobacco: Never Used  Substance and Sexual Activity  . Alcohol use: No  . Drug use: No  . Sexual activity: No  Other Topics Concern  . None  Social History Narrative  . None   Additional Social History:    Pain Medications: see MAR Prescriptions: see MAR Over the Counter: see MAR History of alcohol / drug use?: No history of alcohol / drug abuse   Sleep: improved   Appetite:  Good  Current Medications: Current Facility-Administered Medications  Medication Dose Route Frequency Provider Last Rate Last Dose   . acetaminophen (TYLENOL) tablet 650 mg  650 mg Oral Q6H PRN Ethelene Hal, NP   650 mg at 04/18/17 0919  . albuterol (PROVENTIL HFA;VENTOLIN HFA) 108 (90 Base) MCG/ACT inhaler 2 puff  2 puff Inhalation Q4H PRN Ethelene Hal, NP   2 puff at 04/16/17 1837  . alum & mag hydroxide-simeth (MAALOX/MYLANTA) 200-200-20 MG/5ML suspension 30 mL  30 mL Oral Q4H PRN Ethelene Hal, NP      . divalproex (DEPAKOTE ER) 24 hr tablet 500 mg  500 mg Oral Daily Money, Lowry Ram, FNP   500 mg at 04/18/17 0747  . FLUoxetine (PROZAC) capsule 10 mg  10 mg Oral Daily Deon Duer A, MD      . hydrOXYzine (ATARAX/VISTARIL) tablet 25 mg  25 mg Oral TID PRN Ethelene Hal, NP      . levothyroxine (SYNTHROID, LEVOTHROID) tablet 25 mcg  25 mcg Oral QAC breakfast Ethelene Hal, NP   25 mcg at 04/18/17 [phone]  . loperamide (IMODIUM) capsule 2 mg  2 mg Oral PRN Lindon Romp A, NP      . loratadine (CLARITIN) tablet 10 mg  10 mg Oral Daily Ethelene Hal,  NP   10 mg at 04/18/17 0748  . magnesium hydroxide (MILK OF MAGNESIA) suspension 30 mL  30 mL Oral Daily PRN Ethelene Hal, NP      . montelukast (SINGULAIR) tablet 10 mg  10 mg Oral QHS Ethelene Hal, NP   10 mg at 04/17/17 2200  . paliperidone (INVEGA) 24 hr tablet 6 mg  6 mg Oral QHS Kaely Hollan A, MD      . pantoprazole (PROTONIX) EC tablet 40 mg  40 mg Oral BID Ethelene Hal, NP   40 mg at 04/18/17 0747  . potassium chloride (K-DUR,KLOR-CON) CR tablet 10 mEq  10 mEq Oral Daily Ethelene Hal, NP   10 mEq at 04/18/17 0747  . prazosin (MINIPRESS) capsule 2 mg  2 mg Oral QHS Ethelene Hal, NP   2 mg at 04/17/17 2200  . [START ON 04/19/2017] topiramate (TOPAMAX) tablet 50 mg  50 mg Oral Daily Denetra Formoso A, MD      . traZODone (DESYREL) tablet 50 mg  50 mg Oral QHS PRN Ethelene Hal, NP   50 mg at 04/17/17 2200  . zonisamide (ZONEGRAN) capsule 200 mg  200 mg Oral QHS Calirose Mccance,  Myer Peer, MD        Lab Results:  Results for orders placed or performed during the hospital encounter of 04/15/17 (from the past 48 hour(s))  CBC with Differential/Platelet     Status: Abnormal   Collection Time: 04/17/17  6:44 AM  Result Value Ref Range   WBC 12.3 (H) 4.0 - 10.5 K/uL   RBC 4.72 3.87 - 5.11 MIL/uL   Hemoglobin 14.1 12.0 - 15.0 g/dL   HCT 42.2 36.0 - 46.0 %   MCV 89.4 78.0 - 100.0 fL   MCH 29.9 26.0 - 34.0 pg   MCHC 33.4 30.0 - 36.0 g/dL   RDW 13.7 11.5 - 15.5 %   Platelets 225 150 - 400 K/uL   Neutrophils Relative % 56 %   Neutro Abs 6.9 1.7 - 7.7 K/uL   Lymphocytes Relative 32 %   Lymphs Abs 3.9 0.7 - 4.0 K/uL   Monocytes Relative 11 %   Monocytes Absolute 1.4 (H) 0.1 - 1.0 K/uL   Eosinophils Relative 1 %   Eosinophils Absolute 0.1 0.0 - 0.7 K/uL   Basophils Relative 0 %   Basophils Absolute 0.0 0.0 - 0.1 K/uL    Comment: Performed at [facility], [place] [address] [place], [place] [address]  Comprehensive metabolic panel     Status: None   Collection Time: 04/17/17  6:44 AM  Result Value Ref Range   Sodium 140 135 - 145 mmol/L   Potassium 3.7 3.5 - 5.1 mmol/L   Chloride 105 101 - 111 mmol/L   CO2 23 22 - 32 mmol/L   Glucose, Bld 98 65 - 99 mg/dL   BUN 12 6 - 20 mg/dL   Creatinine, Ser 0.64 0.44 - 1.00 mg/dL   Calcium 9.2 8.9 - 10.3 mg/dL   Total Protein 7.1 6.5 - 8.1 g/dL   Albumin 3.6 3.5 - 5.0 g/dL   AST 20 15 - 41 U/L   ALT 25 14 - 54 U/L   Alkaline Phosphatase 92 38 - 126 U/L   Total Bilirubin 0.4 0.3 - 1.2 mg/dL   GFR calc non Af Amer >60 >60 mL/min   GFR calc Af Amer >60 >60 mL/min    Comment: (NOTE) The eGFR has been calculated using  the CKD EPI equation. This calculation has not been validated in all clinical situations. eGFR's persistently <60 mL/min signify possible Chronic Kidney Disease.    Anion gap 12 5 - 15    Comment: Performed at [facility], [place] [address], [place], [place] [address]   Lipid panel     Status: None   Collection Time: 04/17/17  6:44 AM  Result Value Ref Range   Cholesterol 155 0 - 200 mg/dL   Triglycerides 135 <150 mg/dL   HDL 46 >40 mg/dL   Total CHOL/HDL Ratio 3.4 RATIO   VLDL 27 0 - 40 mg/dL   LDL Cholesterol 82 0 - 99 mg/dL    Comment:        Total Cholesterol/HDL:CHD Risk Coronary Heart Disease Risk Table                     Men   Women  1/2 Average Risk   3.4   3.3  Average Risk       5.0   4.4  2 X Average Risk   9.6   7.1  3 X Average Risk  23.4   11.0        Use the calculated Patient Ratio above and the CHD Risk Table to determine the patient's CHD Risk.        ATP III CLASSIFICATION (LDL):  <100     mg/dL   Optimal  100-129  mg/dL   Near or Above                    Optimal  130-159  mg/dL   Borderline  160-189  mg/dL   High  >190     mg/dL   Very High Performed at [facility] [address], [place], [place] [address]   Hemoglobin A1c     Status: None   Collection Time: 04/17/17  6:44 AM  Result Value Ref Range   Hgb A1c MFr Bld 5.1 4.8 - 5.6 %    Comment: (NOTE) Pre diabetes:          5.7%-6.4% Diabetes:              >6.4% Glycemic control for   <7.0% adults with diabetes    Mean Plasma Glucose 99.67 mg/dL    Comment: Performed at [facility] [address] [place], [place] [address]  TSH     Status: Abnormal   Collection Time: 04/17/17  6:44 AM  Result Value Ref Range   TSH 5.514 (H) 0.350 - 4.500 uIU/mL    Comment: Performed by a 3rd Generation assay with a functional sensitivity of <=0.01 uIU/mL. Performed at [facility], [place] [address] [place], [place] [address]     Blood Alcohol level:  Lab Results  Component Value Date   [facility]  06/24/2008    <5        LOWEST DETECTABLE LIMIT FOR SERUM ALCOHOL IS 5 mg/dL FOR MEDICAL PURPOSES ONLY    Metabolic Disorder Labs: Lab Results  Component Value Date   HGBA1C 5.1 04/17/2017   MPG 99.67 04/17/2017   MPG 100  02/25/2008   No results found for: PROLACTIN Lab Results  Component Value Date   CHOL 155 04/17/2017   TRIG 135 04/17/2017   HDL 46 04/17/2017   CHOLHDL 3.4 04/17/2017   VLDL 27 04/17/2017   LDLCALC 82 04/17/2017   LDLCALC 93 03/01/2012    Physical Findings: AIMS:  Facial and Oral Movements Muscles of Facial Expression: None, normal Lips and Perioral Area: None, normal Jaw: None, normal Tongue: None, normal,Extremity Movements Upper (arms, wrists, hands, fingers): None, normal Lower (legs, knees, ankles, toes): None, normal, Trunk Movements Neck, shoulders, hips: None, normal, Overall Severity Severity of abnormal movements (highest score from questions above): None, normal Incapacitation due to abnormal movements: None, normal Patient's awareness of abnormal movements (rate only patient's report): No Awareness, Dental Status Current problems with teeth and/or dentures?: No Does patient usually wear dentures?: No  CIWA:    COWS:     Musculoskeletal: Strength & Muscle Tone: within normal limits Gait & Station: normal Patient leans: N/A  Psychiatric Specialty Exam: Physical Exam  Nursing note and vitals reviewed. Constitutional: She is oriented to person, place, and time.  Morbidly obese  Respiratory: Effort normal.  Musculoskeletal: Normal range of motion.  Neurological: She is alert and oriented to person, place, and time.  Skin: Skin is warm.    Review of Systems  Constitutional: Negative.   HENT: Negative.   Eyes: Negative.   Respiratory: Negative.   Cardiovascular: Negative.   Gastrointestinal: Negative.   Genitourinary: Negative.   Musculoskeletal: Negative.   Skin: Negative.   Neurological: Negative.   Endo/Heme/Allergies: Negative.   Psychiatric/Behavioral: Positive for depression and hallucinations. Negative for suicidal ideas.  no chest pain, no dyspnea at room air   Blood pressure (!) 139/57, pulse (!) 123, temperature (!) 97.5 F (36.4 C),  temperature source Oral, resp. rate 16, height '5\' 3"'  (1.6 m), weight (!) 201.9 kg (445 lb).Body mass index is 78.83 kg/m.  General Appearance: Fairly Groomed  Eye Contact:  Fair  Speech:  Slow  Volume:  Decreased  Mood:  depressed , sad  Affect:  constricted, and intermittently tearful   Thought Process:  Linear and Descriptions of Associations: Intact  Orientation:  Other:  fully alert and attentive   Thought Content:  reports hallucinations telling her to scratch herself , does not appear internally preoccupied at present, no delusions are expressed  chronic  Suicidal Thoughts:  No denies current suicidal or self injurious plan or intention and contracts for safety on unit   Homicidal Thoughts:  No  Memory:  recent and remote grossly intact  Judgement:  Fair  Insight:  Fair  Psychomotor Activity:  Decreased  Concentration:  Concentration: Good and Attention Span: Good  Recall:  Good  Fund of Knowledge:  Good  Language:  Good  Akathisia:  No  Handed:  Right  AIMS (if indicated):     Assets:  Communication Skills Desire for Improvement Financial Resources/Insurance Housing Physical Health Social Support Transportation  ADL's:  Intact  Cognition:  WNL  Sleep:  Number of Hours: 6.75   Assessment - patient reports ongoing depression, and presents with intermittent tearfulness and constricted affect. Reports auditory hallucinations, but does not currently present internally preoccupied, and no delusions are expressed. Denies SI at present . Remains interested in tapering off some of her medications , simplifying her medication regimen, and wants to go to a Group Home rather than return home after discharge- see above .  (Of note, regarding medications, reports she has been on Zonegran and Topamax together for weight loss and for headache management, but describes feeling overmedicated and cognitively slowed . She also reports feeling Depakote is poorly tolerated and subjectively  sedating ). Of note denies history of seizures .   Treatment Plan Summary: Daily contact with patient to assess and evaluate symptoms and progress in  treatment, Medication management and Plan is to:  -Encourage group and milieu participation to work on coping skills and symptom reduction  -I have discussed case with Hospitalist for management - recommendation is to restart Coreg at lower dose ( 25 mgrs QDAY) , will continue to monitor  -Continue Synthroid 25 mgrs QDAY for Hypothyroidism -Start Prozac 10 mgrs QAM for depression  -Increase Invega to 6 mgrs QDAY for psychosis, mood disorder -Decrease Depakote to 500 mgrs QHS and consider ongoing gradual taper- see above  -Decrease Topamax to 50 mgrs QDAY and consider ongoing gradual taper - see above  -Decrease Zonegran to 200 mgrs QDAY and consider ongoing gradual taper- see above  -Treatment team working on disposition planning options .     Jenne Campus, MD 04/18/2017, 3:16 PM    Patient ID: Marylene Land, female   DOB: [birth year], 22 y.o.   MRN: [id number]

## 2017-04-18 NOTE — [facility] Group Notes (Signed)
[facility] LCSW Group Therapy Note  Date/Time: 04/18/17, 1315  Type of Therapy and Topic:  Group Therapy:  Overcoming Obstacles  Participation Level:  active  Description of Group:    In this group patients will be encouraged to explore what they see as obstacles to their own wellness and recovery. They will be guided to discuss their thoughts, feelings, and behaviors related to these obstacles. The group will process together ways to cope with barriers, with attention given to specific choices patients can make. Each patient will be challenged to identify changes they are motivated to make in order to overcome their obstacles. This group will be process-oriented, with patients participating in exploration of their own experiences as well as giving and receiving support and challenge from other group members.  Therapeutic Goals: 1. Patient will identify personal and current obstacles as they relate to admission. 2. Patient will identify barriers that currently interfere with their wellness or overcoming obstacles.  3. Patient will identify feelings, thought process and behaviors related to these barriers. 4. Patient will identify two changes they are willing to make to overcome these obstacles:    Summary of Patient Progress: Pt identified bipolar disorder and her family as the two obstacles in her life.  Pt somewhat reserved but did participate in the group discussion prior to leaving group early due to her parents coming to the unit for a visit.      Therapeutic Modalities:   Cognitive Behavioral Therapy Solution Focused Therapy Motivational Interviewing Relapse Prevention Therapy  Daleen SquibbGreg Travon Crochet, LCSW

## 2017-04-18 NOTE — Progress Notes (Signed)
D:Pt reports having command auditory hallucinations and says that she has been having them for a long time. Pt says that she wants to have her medications adjusted to help with the voices and wants to live in a group home due to having family conflicts. She rates depression and anxiety as a 10 on 0-10 scale with 10 being the most. She c/o a headache and was given prn tylenol.  A:Offered support, encouragement, 15 minute checks and medications as ordered.  R:Pt denies si and hi. She contracts with staff for safety. Safety maintained on the unit.

## 2017-04-19 MED ORDER — TRAZODONE HCL 100 MG PO TABS
100.0000 mg | ORAL_TABLET | Freq: Every evening | ORAL | Status: DC | PRN
  Administered 2017-04-19 – 2017-04-24 (×6): 100 mg via ORAL
  Filled 2017-04-19 (×6): qty 1

## 2017-04-19 NOTE — Progress Notes (Signed)
D: Patient observed up and around milieu. Frequently at NS with complaints, requests of staff. Patient states she continues to have command AH but denies plan, intent. Is able to verbally contract for safety. Patient's affect incongruent with report as patient has smile on her face as she answers assessment questions. No evidence of response to internal stimuli. Per self inventory and discussions with writer, rates depression at a 10/10, hopelessness at a 10/10 and anxiety at a 10/10. Rates sleep as poor, appetite as good, energy as low and concentration as poor.  States goal for today is to "find good coping skills that work for me." Complained of dizziness this AM. Gait appeared steady however. Reported headache of a 6/10 midday.   A: Medicated per orders, prn haldol, vistaril and tylenol given for complaints throughout the day. Level III obs in place for safety. Emotional support offered and self inventory reviewed. Encouraged completion of Suicide Safety Plan and programming participation. Discussed POC with MD, SW.  Fall prevention plan initiated and reviewed with patient due to patient's dizziness.   R: Patient verbalizes understanding of POC, falls prevention education. On reassess, patient stated her anxiety was unchanged however pain resolved as patient was observed to be asleep. Patient denies HI/VH and remains safe on level III obs. Will continue to monitor closely and make verbal contact frequently.

## 2017-04-19 NOTE — Plan of Care (Signed)
Patient has not acted out, has not had periods of aggression.  Patient states she has not slept well and per chart, hours of sleep were 4.5 last night. Sleep medicine ordered prn for tonight.

## 2017-04-19 NOTE — [facility] Group Notes (Signed)
Adult Psychoeducational Group Note  Date:  04/19/2017 Time:  9:52 PM  Group Topic/Focus:  Wrap-Up Group:   The focus of this group is to help patients review their daily goal of treatment and discuss progress on daily workbooks.  Participation Level:  Active  Participation Quality:  Appropriate and Attentive  Affect:  Appropriate  Cognitive:  Alert and Appropriate  Insight: Appropriate and Good  Engagement in Group:  Engaged  Modes of Intervention:  Discussion and Education  Additional Comments:  Pt attended and participated in wrap up group this evening. Pt is feeling a little bit better than they were yesterday. They [facility] [place] which is making them feel depressed and tired. Pt does not remember their goal.  Chrisandra NettersOctavia A Brittlyn Cloe 04/19/2017, 9:52 PM

## 2017-04-19 NOTE — Progress Notes (Signed)
CSW spoke to mother who said the visit went well yesterday until the subject of a group home came up and then pt got upset and accused them of trying to keep her from this.  Mother also spoke to MD yesterday about guardianship, which they have not pursued.  Pt then apparently got upset about this later in the evening.  CSW informed her that we would initiate some group home options since the pt is requesting this.  Mother asked that we coordinate with pt ACT team and provided number: [phone]. Garner NashGregory  Shellhammer, MSW, LCSW Clinical Social Worker 04/19/2017 9:39 AM

## 2017-04-19 NOTE — [facility] Group Notes (Signed)
LCSW Group Therapy Note 04/19/2017 12:34 PM  Type of Therapy/Topic: Group Therapy: Feelings about Diagnosis  Participation Level: Active   Description of Group:  This group will allow patients to explore their thoughts and feelings about diagnoses they have received. Patients will be guided to explore their level of understanding and acceptance of these diagnoses. Facilitator will encourage patients to process their thoughts and feelings about the reactions of others to their diagnosis and will guide patients in identifying ways to discuss their diagnosis with significant others in their lives. This group will be process-oriented, with patients participating in exploration of their own experiences, giving and receiving support, and processing challenge from other group members.  Therapeutic Goals: 1. Patient will demonstrate understanding of diagnosis as evidenced by identifying two or more symptoms of the disorder 2. Patient will be able to express two feelings regarding the diagnosis 3. Patient will demonstrate their ability to communicate their needs through discussion and/or role play  Summary of Patient Progress:  Misty [name] was engaged and participated in the group's discussion. Misty [name] contributed and stated that when she learned she had a mental health diagnosis, she felt judged by other people. Misty [name] states that due to her mental health, she has a hard time managing relationships. Misty [name] reports that she plans to practice mindfulness and other copping skills to help manage her symptoms of her diagnosis.    Therapeutic Modalities:  Cognitive Behavioral Therapy Brief Therapy Feelings Identification    Willadene Mounsey Catalina AntiguaWilliams LCSWA Clinical Social Worker

## 2017-04-19 NOTE — Progress Notes (Signed)
Adult Psychoeducational Group Note  Date:  04/19/2017 Time:  9:36 AM  Group Topic/Focus:  Orientation:   The focus of this group is to educate the patient on the purpose and policies of crisis stabilization and provide a format to answer questions about their admission.  The group details unit policies and expectations of patients while admitted.  Participation Level:  Active  Participation Quality:  Appropriate  Affect:  Appropriate  Cognitive:  Appropriate  Insight: Appropriate  Engagement in Group:  Engaged  Modes of Intervention:  Discussion and Education  Additional Comments:    Pt participated in group. Pt's goal today is to learn coping skills for anxiety and depression. Pt stated that one coping skill that currently works for her in mindfulness.   Karren CobbleFizah G Meldrick Buttery 04/19/2017, 9:36 AM
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
## 2017-04-20 MED ORDER — LOPERAMIDE HCL 2 MG PO CAPS
4.0000 mg | ORAL_CAPSULE | Freq: Once | ORAL | Status: AC
  Administered 2017-04-20: 4 mg via ORAL

## 2017-04-20 MED ORDER — BUPROPION HCL ER (XL) 150 MG PO TB24
150.0000 mg | ORAL_TABLET | Freq: Every day | ORAL | Status: DC
  Administered 2017-04-20 – 2017-04-21 (×2): 150 mg via ORAL
  Filled 2017-04-20 (×4): qty 1

## 2017-04-20 MED ORDER — DIVALPROEX SODIUM ER 250 MG PO TB24
250.0000 mg | ORAL_TABLET | Freq: Every day | ORAL | Status: DC
  Administered 2017-04-21 – 2017-04-25 (×5): 250 mg via ORAL
  Filled 2017-04-20 (×7): qty 1

## 2017-04-20 MED ORDER — PALIPERIDONE PALMITATE 234 MG/1.5ML IM SUSP
234.0000 mg | Freq: Once | INTRAMUSCULAR | Status: DC
Start: 2017-05-05 — End: 2017-04-26

## 2017-04-20 NOTE — Progress Notes (Signed)
Adult Psychoeducational Group Note  Date:  04/20/2017 Time:  9:23 PM  Group Topic/Focus:  Wrap-Up Group:   The focus of this group is to help patients review their daily goal of treatment and discuss progress on daily workbooks.  Participation Level:  Active  Participation Quality:  Appropriate  Affect:  Appropriate  Cognitive:  Appropriate  Insight: Appropriate  Engagement in Group:  Engaged  Modes of Intervention:  Discussion  Additional Comments:  Patient attended wrap-up and participated.  Ajeet Casasola W Faven Watterson 04/20/2017, 9:23 PM

## 2017-04-20 NOTE — [facility] Group Notes (Signed)
[facility] Group Notes:  (Nursing/MHT/Case Management/Adjunct)  Date:  04/20/2017  Time:  5:38 PM  Type of Therapy:  Nurse Education  Participation Level:  Active  Participation Quality:  Appropriate and Attentive  Affect:  Appropriate  Cognitive:  Appropriate  Insight:  Appropriate  Engagement in Group:  Engaged  Modes of Intervention:  Activity and Discussion  Summary of Progress/Problems:  This group discussed the integral connectedness between the mind, body and the emotions and how ones body posture can significantly impact ones out look and emotions. Power stances were practiced and emotions were assessed before and after.  Misty [name] Misty [name] 04/20/2017, 5:38 PM

## 2017-04-20 NOTE — Progress Notes (Signed)
[facility] MD Progress Note  04/20/2017 3:20 PM Misty NissenKristal [facility]The [facility]  MRN:  [id number]   Subjective:  Patient states that she feels very sad today. She is still reports AH of self harm, but denies any SI/HI/VH. She reports diarrhea today and is asking if it is de to the medication changes. She denies any medication side effects.   Objective: Patient's chart and findings reviewed and discussed with treatment team. Patient presents in her room and is pleasant and cooperative. She has been getting up for groups and going to meals. She has been getting medications for diarrhea.  [facility] confirmed medications today. Patient is restarted on Wellbutrin. Invega stopped as [place] 234 mg IM Q28 Days last given 04-01-17.  Patient states agreement with medication changes, but then asks me later about her medication as she did not understand about the changes.   [facility] medication changes. Mom confirms that the patient has a big behavior issue. She reprots that the patient acts differently towards her when her ACTT or other care provider is around. Mom feels she is manipulative and she changes what she says and how she says it depending on who is around. Mom is okay with her returning home, but mom cannot visit [facility] due transportation issues. She is ok with [facility] assessing her and bringing her home though. Informed mom of her medication changes and mom states that the patient's PCP has made changes to her psychiatric medications, even though she has the ACTT services. Mom is encouraged to let one provider manage her medications for psychiatric, preferably the ACTT.  Principal Problem: Bipolar I disorder (HCC) Diagnosis:   Patient Active Problem List   Diagnosis Date Noted  . Bipolar 1 disorder (HCC) [F31.9] 04/15/2017  . Swelling of both lower extremities [M79.89] 12/14/2016  . Lithium toxicity [T56.891A] 04/05/2016  . Fatty liver [K76.0] 10/10/2015  . Iron  deficiency [E61.1] 10/10/2015  . Bipolar I disorder (HCC) [F31.9] 10/10/2015  . Palpitations [R00.2] 07/21/2015  . Morbid obesity (HCC) [E66.01] 04/03/2015  . Borderline personality disorder (HCC) [F60.3] 03/14/2015  . Allergic rhinitis [J30.9] 10/08/2014  . GERD (gastroesophageal reflux disease) [K21.9] 10/08/2014  . H/O ASTHMA [J45.909] 10/08/2014  . INTERMITTENT URTICARIA [Z87.2] 10/08/2014  . Posttraumatic stress disorder [F43.10] 09/04/2014  . Sinus tachycardia [R00.0] 08/07/2014  . Hypertension [I10] 05/16/2013  . Metabolic syndrome [E88.81] 09/04/2012  . GAD (generalized anxiety disorder) [F41.1] 07/19/2012  . ODD (oppositional defiant disorder) [F91.3] 07/19/2012  . Obstructive sleep apnea [G47.33] 07/19/2012  . MDD (major depressive disorder), recurrent episode, moderate (HCC) [F33.1] 03/01/2012   Total Time spent with patient: 30 minutes  Past Psychiatric History: See H&P  Past Medical History:  Past Medical History:  Diagnosis Date  . Allergic rhinitis   . Anxiety   . Depression   . GERD (gastroesophageal reflux disease)   . HTN (hypertension) 07/13/12  . IBS (irritable bowel syndrome)   . Obesity   . OSA on CPAP   . Psoriasis     Past Surgical History:  Procedure Laterality Date  . ADENOIDECTOMY    . SINOSCOPY    . TONSILLECTOMY AND ADENOIDECTOMY  Age 22   Deviated septum corrected at same time   Family History:  Family History  Problem Relation Age of Onset  . Depression Mother   . Allergic rhinitis Mother   . Depression Maternal Grandmother   . Diabetes Maternal Grandmother   . Hypertension Maternal Grandmother   . Bipolar  disorder Father   . Drug abuse Father   . Hypertension Father    Family Psychiatric  History: See H&P Social History:  Social History   Substance and Sexual Activity  Alcohol Use No     Social History   Substance and Sexual Activity  Drug Use No    Social History   Socioeconomic History  . Marital status: Single     Spouse name: None  . Number of children: None  . Years of education: None  . Highest education level: None  Social Needs  . Financial resource strain: None  . Food insecurity - worry: None  . Food insecurity - inability: None  . Transportation needs - medical: None  . Transportation needs - non-medical: None  Occupational History  . Occupation: STUDENT    Employer: [employer]    Comment: 11th grade [employer]  Tobacco Use  . Smoking status: Never Smoker  . Smokeless tobacco: Never Used  Substance and Sexual Activity  . Alcohol use: No  . Drug use: No  . Sexual activity: No  Other Topics Concern  . None  Social History Narrative  . None   Additional Social History:    Pain Medications: see MAR Prescriptions: see MAR Over the Counter: see MAR History of alcohol / drug use?: No history of alcohol / drug abuse                    Sleep: Fair  Appetite:  Good  Current Medications: Current Facility-Administered Medications  Medication Dose Route Frequency Provider Last Rate Last Dose  . acetaminophen (TYLENOL) tablet 650 mg  650 mg Oral Q6H PRN Laveda Abbe, NP   650 mg at 04/20/17 1056  . albuterol (PROVENTIL HFA;VENTOLIN HFA) 108 (90 Base) MCG/ACT inhaler 2 puff  2 puff Inhalation Q4H PRN Laveda Abbe, NP   2 puff at 04/19/17 [phone]  . alum & mag hydroxide-simeth (MAALOX/MYLANTA) 200-200-20 MG/5ML suspension 30 mL  30 mL Oral Q4H PRN Laveda Abbe, NP      . buPROPion (WELLBUTRIN XL) 24 hr tablet 150 mg  150 mg Oral Daily Sible Straley B, FNP   150 mg at 04/20/17 1213  . carvedilol (COREG) tablet 25 mg  25 mg Oral Daily Cobos, Rockey Situ, MD   25 mg at 04/20/17 0754  . [START ON 04/21/2017] divalproex (DEPAKOTE ER) 24 hr tablet 250 mg  250 mg Oral Daily Dessire Grimes B, FNP      . haloperidol (HALDOL) tablet 5 mg  5 mg Oral Q6H PRN Donell Sievert E, PA-C   5 mg at 04/19/17 1817  . hydrOXYzine (ATARAX/VISTARIL) tablet 25 mg  25 mg Oral TID PRN  Laveda Abbe, NP   25 mg at 04/20/17 1415  . levothyroxine (SYNTHROID, LEVOTHROID) tablet 25 mcg  25 mcg Oral QAC breakfast Laveda Abbe, NP   25 mcg at 04/20/17 [phone]  . loperamide (IMODIUM) capsule 2 mg  2 mg Oral PRN Nira Conn A, NP   2 mg at 04/20/17 1512  . loratadine (CLARITIN) tablet 10 mg  10 mg Oral Daily Laveda Abbe, NP   10 mg at 04/20/17 0754  . magnesium hydroxide (MILK OF MAGNESIA) suspension 30 mL  30 mL Oral Daily PRN Laveda Abbe, NP      . montelukast (SINGULAIR) tablet 10 mg  10 mg Oral QHS Laveda Abbe, NP   10 mg at 04/19/17 2206  . pantoprazole (PROTONIX) EC  tablet 40 mg  40 mg Oral BID Laveda Abbe, NP   40 mg at 04/20/17 0754  . prazosin (MINIPRESS) capsule 2 mg  2 mg Oral QHS Laveda Abbe, NP   2 mg at 04/19/17 2206  . traZODone (DESYREL) tablet 100 mg  100 mg Oral QHS PRN Lundon Verdejo, Gerlene Burdock, FNP   100 mg at 04/19/17 2206    Lab Results: No results found for this or any previous visit (from the past 48 hour(s)).  Blood Alcohol level:  Lab Results  Component Value Date   [facility]  06/24/2008    <5        LOWEST DETECTABLE LIMIT FOR SERUM ALCOHOL IS 5 mg/dL FOR MEDICAL PURPOSES ONLY    Metabolic Disorder Labs: Lab Results  Component Value Date   HGBA1C 5.1 04/17/2017   MPG 99.67 04/17/2017   MPG 100 02/25/2008   No results found for: PROLACTIN Lab Results  Component Value Date   CHOL 155 04/17/2017   TRIG 135 04/17/2017   HDL 46 04/17/2017   CHOLHDL 3.4 04/17/2017   VLDL 27 04/17/2017   LDLCALC 82 04/17/2017   LDLCALC 93 03/01/2012    Physical Findings: AIMS: Facial and Oral Movements Muscles of Facial Expression: None, normal Lips and Perioral Area: None, normal Jaw: None, normal Tongue: None, normal,Extremity Movements Upper (arms, wrists, hands, fingers): None, normal Lower (legs, knees, ankles, toes): None, normal, Trunk Movements Neck, shoulders, hips: None, normal, Overall  Severity Severity of abnormal movements (highest score from questions above): None, normal Incapacitation due to abnormal movements: None, normal Patient's awareness of abnormal movements (rate only patient's report): No Awareness, Dental Status Current problems with teeth and/or dentures?: No Does patient usually wear dentures?: No  CIWA:    COWS:     Musculoskeletal: Strength & Muscle Tone: within normal limits Gait & Station: normal Patient leans: N/A  Psychiatric Specialty Exam: Physical Exam  Nursing note and vitals reviewed. Constitutional: She is oriented to person, place, and time. She appears well-developed and well-nourished.  Cardiovascular: Normal rate.  Respiratory: Effort normal.  Musculoskeletal: Normal range of motion.  Neurological: She is alert and oriented to person, place, and time.  Skin: Skin is warm.    Review of Systems  Constitutional: Negative.   HENT: Negative.   Eyes: Negative.   Respiratory: Negative.   Cardiovascular: Negative.   Gastrointestinal: Positive for diarrhea.  Genitourinary: Negative.   Musculoskeletal: Negative.   Skin: Negative.   Endo/Heme/Allergies: Negative.   Psychiatric/Behavioral: Positive for depression. Negative for hallucinations, substance abuse and suicidal ideas. The patient is nervous/anxious.     Blood pressure 116/69, pulse (!) 102, temperature 97.6 F (36.4 C), temperature source Oral, resp. rate 20, height 5\' 3"  (1.6 m), weight (!) 201.9 kg (445 lb).Body mass index is 78.83 kg/m.  General Appearance: Casual  Eye Contact:  Good  Speech:  Clear and Coherent and Slow  Volume:  Decreased  Mood:  Depressed  Affect:  Depressed and Flat  Thought Process:  Linear and Descriptions of Associations: Intact  Orientation:  Full (Time, Place, and Person)  Thought Content:  WDL  Suicidal Thoughts:  No  Homicidal Thoughts:  No  Memory:  Immediate;   Good Recent;   Fair Remote;   Poor  Judgement:  Fair  Insight:  Fair   Psychomotor Activity:  Normal  Concentration:  Concentration: Fair and Attention Span: Fair  Recall:  Good  Fund of Knowledge:  Fair  Language:  Good  Akathisia:  No  Handed:  Right  AIMS (if indicated):     Assets:  Communication Skills Desire for Improvement Financial Resources/Insurance Housing Social Support Transportation  ADL's:  Intact  Cognition:  WNL  Sleep:  Number of Hours: 6.75   Problems Addressed: Borderline Personality Bipolar I PTSD  Treatment Plan Summary: Daily contact with patient to assess and evaluate symptoms and progress in treatment, Medication management and Plan is to:  -Decrease Depakote 250 mg PO Daily for mood stability -Stop Prozac  -Start Wellbutrin XL 150 mg PO Daily for mood stability -Continue Haldol 5 mg PO Q6H PRN for agitation -Continue Trazodone 100 mg PO QH SPRN for insomnia -Continue Vistaril 25 mg PO TID PRN for anxiety -Continue Prazosin 2 mg PO QHS for nightmares -Stop Hinda Glatter Hinda Glatter [place] 234 mg IM Q28 Days confirmed given on 04-01-17 next due on 04-29-17 -Encourage group therapy participation -CSW to assist with [facility] placement  Maryfrances Bunnell, FNP 04/20/2017, 3:20 PM

## 2017-04-20 NOTE — Progress Notes (Signed)
Pt presents with a sad affect and depressed mood. Pt reported feeling sad this morning and expressed wanting to feel happy. Pt stated that she feels sadder since taking Prozac. Pt reports difficulty sleeping at bedtime due to nightmares and racing thoughts. Pt walked up to writer and stated, "I would like to find happiness". Pt c/o diarrhea this morning, having three loose stools. RN witnessed one of the episodes and administered Imodium to pt. Pt encouraged to increase fluid intake. Orders reviewed with pt. Verbal support provided. Pt encouraged to attend groups. 15 minuet checks performed for safety. Pt compliant with tx.

## 2017-04-20 NOTE — Progress Notes (Signed)
Pt reports she is feeling a little better today, but is still having anxiety and depression.  She shared that her mother had told her that her [facility] and she is worried about him.  She has been in and out of the dayroom.  She has minimal interaction with the other patients.  She still has passive suicidal thoughts, but contracts for safety.  She denies HI/AVH.  She makes her needs known to staff.  Support and encouragement offered.  Pt is taking her meds as ordered.  Discharge plans are in process.  Safety maintained with q15 minute checks.

## 2017-04-20 NOTE — Progress Notes (Signed)
Patient ID: Misty [name], female   DOB: Nov 10, 1995, 22 y.o.   MRN: [id number]  Pt currently presents with a depressed affect and labile behavior. Pt pleasant and smiling when interacting with peers, tearful and frowning when speaking with staff. Pt reports to writer that their goal is to "not be sad anymore." Endorses ongoing negative thinking. Reports she is happy when she is not thinking of hurting herself. Pt reports poor sleep with current medication regimen.   Pt provided with scheduled and as needed medications per providers orders. Pt's labs and vitals were monitored throughout the night. Pt given a 1:1 about emotional and mental status. Pt supported and encouraged to express concerns and questions. Pt educated on medications and CBT. Pt brightens after interaction with Clinical research associatewriter. Pt encouraged to speak to others during periods of AH and negative thinking.   Pt's safety ensured with 15 minute and environmental checks. Pt states "I told my mom I just don't want to live anymore." Pt encouraged to speak with MD in the morning. Pt concerned medications are not working for her. Pt currently denies HI and visual hallucinations. Pt verbally agrees to seek staff if SI/AH worsens, HI or VH occurs and to consult with staff before acting on any harmful thoughts. Will continue POC.

## 2017-04-20 NOTE — Progress Notes (Signed)
Recreation Therapy Notes  Date: 3.13.19 Time: 9:30 a.m. Location: [address] Dayroom  Group Topic: Stress Management  Goal Area(s) Addresses:  Goal 1.1: To reduce stress  -Patient will report feeling a reduction in stress level  -Patient will identify the importance of stress management  -Patient will participate during stress management group treatment    Intervention: Stress Management  Activity: Meditattion- Recreation Therapy Intern played a guided meditation video. Patients were in a peaceful environment with soft lighting enhancing patients mood.   Education: Stress Management, Discharge Planning.   Education Outcome: Acknowledges edcuation/In group clarification offered/Needs additional education  Clinical Observations/Feedback:: Patient did not attend    Sheryle HailDarian Dorinne Graeff, Recreation Therapy Intern   Sheryle HailDarian Janette Harvie 04/20/2017 8:08 AM

## 2017-04-20 NOTE — Progress Notes (Addendum)
CSW spoke to [facility], [phone], team representative who reports that the primary issue is conflict in the home.  They have been discussing possible group home placement for some time as well as independent living.  No one (ACT team, pt family) believes pt is ready for independent living.  Family has not been willing to move forward with group home so far.  CSW informed her that family is saying they are in agreement to begin the process and move forward.  ACT team will look for some group home options in pt area [facility] and find possible openings.  CSW also requested med list, which they will fax.  Pt is on injectable and they will send over name and date of last injection. Garner NashGregory Stephana Morell, MSW, LCSW Clinical Social Worker 04/20/2017 8:34 AM

## 2017-04-20 NOTE — Plan of Care (Signed)
  Progressing Coping: Ability to verbalize frustrations and anger appropriately will improve 04/20/2017 1116 - Progressing by Layla BarterWhite, Sugey Trevathan L, RN Health Behavior/Discharge Planning: Compliance with treatment plan for underlying cause of condition will improve 04/20/2017 1116 - Progressing by Layla BarterWhite, Anniemae Haberkorn L, RN Safety: Periods of time without injury will increase 04/20/2017 1116 - Progressing by Layla BarterWhite, Amarria Andreasen L, RN   Not Progressing Activity: Sleeping patterns will improve 04/20/2017 1116 - Not Progressing by Layla BarterWhite, Krishon Adkison L, RN Education: Emotional status will improve 04/20/2017 1116 - Not Progressing by Layla BarterWhite, Aarionna Germer L, RN

## 2017-04-20 NOTE — Progress Notes (Signed)
CS[facility] spoke with [facility] team and requested that they come visit pt to assess her status.  Team lead, Agapito GamesCarissa Brown, is out sick today.  Requested visit by the end of the week and a call back to confirm as well as any progress on group home status. Garner NashGregory Tahiry Spicer, MSW, LCSW Clinical Social Worker 04/20/2017 1:35 PM

## 2017-04-20 NOTE — [facility] Group Notes (Signed)
[facility] Group Therapy 04/20/2017 1:15pm  Type of Therapy: Mental Health Association Presentation  Participation Level: Invited. DID NOT ATTEND. Pt chose to remain in bed.   Summary of Progress/Problems: Mental Health Association [facility]) Speaker came to talk about his personal journey with mental health. The pt processed ways by which to relate to the speaker. MHA speaker provided handouts and educational information pertaining to groups and services offered by the [facility]. Pt was engaged in speaker's presentation and was receptive to resources provided.    Pulte HomesHeather N Smart, LCSW 04/20/2017 9:16 AM

## 2017-04-20 NOTE — Progress Notes (Signed)
Adult Psychoeducational Group Note  Date:  04/20/2017 Time:  7:05 PM  Group Topic/Focus:  Building Self Esteem:   The Focus of this group is helping patients become aware of the effects of self-esteem on their lives, the things they and others do that enhance or undermine their self-esteem, seeing the relationship between their level of self-esteem and the choices they make and learning ways to enhance self-esteem.  Participation Level:  Active  Participation Quality:  Appropriate  Affect:  Appropriate  Cognitive:  Alert and Appropriate  Insight: Appropriate  Engagement in Group:  Engaged  Modes of Intervention:  Activity and Discussion  Additional Comments:  Pt did attend group and participated in activity and discussion.  Savon Bordonaro R Catlynn Grondahl 04/20/2017, 7:05 PM

## 2017-04-21 MED ORDER — BUPROPION HCL ER (XL) 300 MG PO TB24
300.0000 mg | ORAL_TABLET | Freq: Every day | ORAL | Status: DC
  Administered 2017-04-22 – 2017-04-25 (×4): 300 mg via ORAL
  Filled 2017-04-21 (×7): qty 1

## 2017-04-21 NOTE — B[facility] Group Notes (Signed)
LCSW Group Therapy Note  04/21/2017 1:15pm  Type of Therapy/Topic:  Group Therapy:  Balance in Life  Participation Level:  Active  Description of Group:    This group will address the concept of balance and how it feels and looks when one is unbalanced. Patients will be encouraged to process areas in their lives that are out of balance and identify reasons for remaining unbalanced. Facilitators will guide patients in utilizing problem-solving interventions to address and correct the stressor making their life unbalanced. Understanding and applying boundaries will be explored and addressed for obtaining and maintaining a balanced life. Patients will be encouraged to explore ways to assertively make their unbalanced needs known to significant others in their lives, using other group members and facilitator for support and feedback.  Therapeutic Goals: 1. Patient will identify two or more emotions or situations they have that consume much of in their lives. 2. Patient will identify signs/triggers that life has become out of balance:  3. Patient will identify two ways to set boundaries in order to achieve balance in their lives:  4. Patient will demonstrate ability to communicate their needs through discussion and/or role plays  Summary of Patient Progress: Patient appropriately participated in group and responded to prompts when directly asked. Patient identified family and emotional or mental health as areas of her life that are out of balance. Patient identified listening to music as a coping skill.   Therapeutic Modalities:   Cognitive Behavioral Therapy Solution-Focused Therapy Assertiveness Training  Darreld McleanCharlotte C Lauris Serviss, Student-Social Work 04/21/2017 2:15 PM

## 2017-04-21 NOTE — Plan of Care (Signed)
  Progressing Activity: Interest or engagement in activities will improve 04/21/2017 1453 - Progressing by Layla BarterWhite, Yvett Rossel L, RN Health Behavior/Discharge Planning: Compliance with treatment plan for underlying cause of condition will improve 04/21/2017 1453 - Progressing by Layla BarterWhite, Vittoria Noreen L, RN Safety: Periods of time without injury will increase 04/21/2017 1453 - Progressing by Layla BarterWhite, Hamish Banks L, RN   Not Progressing Education: Emotional status will improve 04/21/2017 1453 - Not Progressing by Layla BarterWhite, Maeryn Mcgath L, RN

## 2017-04-21 NOTE — Progress Notes (Signed)
Pt requested to speak with CSW through her RN.  CSW spoke with pt who showed CSW some writing in her journal regarding 8 reasons she was suicidal.  We discussed that pt was feelings worse and that she will be meeting with MD today about this.  CSW updated pt on ACT team starting process for group home but that it would take some time and pt would discharge home while this was in process. Pt seemed OK with this.   [facility] this and they are aware pt is reporting SI and of the things in the journal. Garner NashGregory Lanaysia Fritchman, MSW, LCSW Clinical Social Worker 04/21/2017 12:12 PM

## 2017-04-21 NOTE — [facility] Group Notes (Signed)
[facility] Group Notes:  (Nursing/MHT/Case Management/Adjunct)  Date:  04/21/2017  Time:  4:00 p.m.  Type of Therapy:  Psychoeducational Skills  Participation Level:  Active  Participation Quality:  Appropriate  Affect:  Appropriate  Cognitive:  Appropriate  Insight:  Appropriate  Engagement in Group:  Engaged  Modes of Intervention:  Education  Summary of Progress/Problems:  Patient active and participated appropriately in  Group.   Earline MayotteKnight, Jaquann Guarisco Shephard 04/21/2017, 5:38 PM

## 2017-04-21 NOTE — Progress Notes (Signed)
CSW spoke to [place] at [facility] and was then transferred to [employer] PalmoliveCarissa Brown, team lead.  Larita FifeLynn said that the subject of at [facility] was discussed at morning meeting and "tabled."  Larita FifeLynn again said this is not there practice as [facility] of [facility] catchment area.  CSW left message for Celoronarissa requesting visit and assistance with transportation home for pt as pt family is reporting they cannot transport. Garner NashGregory Lyann Hagstrom, MSW, LCSW Clinical Social Worker 04/21/2017 12:07 PM

## 2017-04-21 NOTE — Progress Notes (Signed)
Pt attended morning orientation/goals group and understood the unit rules while stating that her goal for the day is "to speak with the doctor more about how she is feeling".

## 2017-04-21 NOTE — Progress Notes (Signed)
Patient ID: Misty [name], female   DOB: [birth year], 22 y.o.   MRN: [id number]  Pt currently presents with an appropriate affect and restless behavior. Pt affect brightens during interaction. Pt voices hope that she will be able to go to Group Home post discharge. Reports that her mom has spoken with her ACTT team and they are trying to get a hold of the MD about bed placement. Pt reports to writer that their goal is to "One, be independent and two, keep my service dog around." Pt states "I am worried about what people will say about this life altering decision." Pt reports good sleep with current medication regimen.   Pt provided with medications per providers orders. Pt's labs and vitals were monitored throughout the night. Pt given a 1:1 about emotional and mental status. Pt supported and encouraged to express concerns and questions. Pt educated on medications and assertiveness techniques.   Pt's safety ensured with 15 minute and environmental checks. Pt currently denies SI/HI and A/V hallucinations. Pt verbally agrees to seek staff if SI/HI or A/VH occurs and to consult with staff before acting on any harmful thoughts. Will continue POC.

## 2017-04-21 NOTE — Progress Notes (Signed)
Adult Psychoeducational Group Note  Date:  04/21/2017 Time:  8:46 PM  Group Topic/Focus:  Wrap-Up Group:   The focus of this group is to help patients review their daily goal of treatment and discuss progress on daily workbooks.  Participation Level:  Active  Participation Quality:  Appropriate  Affect:  Appropriate  Cognitive:  Appropriate  Insight: Appropriate  Engagement in Group:  Engaged  Modes of Intervention:  Discussion  Additional Comments:  Patient attended group and participated.  Melaya Hoselton W Tonda Wiederhold 04/21/2017, 8:46 PM

## 2017-04-21 NOTE — BHH Counselor (Signed)
Patient spoke with Clinical research associatewriter after SW group to express her concerns to discharge home prior to going to a group home.   Patient stated she was concerned about going home and "having conflict and ending up back here." Patient would not specify safety concerns and denied SI. Patient requested information about group home living. Writer stated she would follow up with assigned CSW and the patient's ACT Team, if appropriate.

## 2017-04-21 NOTE — Progress Notes (Signed)
Adult Psychoeducational Group Note  Date:  04/21/2017 Time:  10:58 AM  Group Topic/Focus:  Managing Feelings:   The focus of this group is to identify what feelings patients have difficulty handling and develop a plan to handle them in a healthier way upon discharge.  Participation Level:  Active  Participation Quality:  Appropriate  Affect:  Appropriate  Cognitive:  Alert and Appropriate  Insight: Appropriate, Good and Improving  Engagement in Group:  Engaged  Modes of Intervention:  Activity and Discussion  Additional Comments:  Pt attended group and participated in activity and discussion  Francois Elk R Shella Lahman 04/21/2017, 10:58 AM

## 2017-04-21 NOTE — Progress Notes (Signed)
Pt presents with a flat affect and depressed mood. Pt appeared sad on approach and expressed feeling like she doesn't want to live anymore. Pt verbally contracts for safety. Pt verbalized that the SI started last night. Pt reports depression and sadness this morning. Pt denies any triggers to mood. Medications reviewed with pt. Medications administered as ordered per MD. Verbal support provided. Pt encouraged to attend groups. 15 minute checks performed for safety.

## 2017-04-21 NOTE — Progress Notes (Signed)
[facility] MD Progress Note  04/21/2017 3:20 PM Misty [name]  MRN:  [id number]   Subjective:  Patient reports that she has written a list of things that are bothering her and would like me to rad it. She denies any SI/HI/AVH and contracts for safety. She denies any medication side effects. She reports talking to her mom and having a good conversation.      Objective: Patient's chart and findings reviewed and discussed with treatment team. Patient is in the day room and interacting with peers and staff. Her list is mainly not liking th chores she has to do at home and feeling as her mom makes her do all of the chores and doesn't help her, but then states that her 25 y.o. Brother helps her sometimes too. She also reports that she doesn't like her father coming and going all the time and then she reports that her father and mother are divorced and he lives in [place] and currently in in a hospital in a coma.  She shows significant Borderline traits, but has remained safe and compliant on the [facility] unit. Will increase the Wellbutrin XL to 300 mg Daily for tomorrows dose.    Principal Problem: Bipolar I disorder (HCC) Diagnosis:   Patient Active Problem List   Diagnosis Date Noted  . Bipolar 1 disorder (HCC) [F31.9] 04/15/2017  . Swelling of both lower extremities [M79.89] 12/14/2016  . Lithium toxicity [T56.891A] 04/05/2016  . Fatty liver [K76.0] 10/10/2015  . Iron deficiency [E61.1] 10/10/2015  . Bipolar I disorder (HCC) [F31.9] 10/10/2015  . Palpitations [R00.2] 07/21/2015  . Morbid obesity (HCC) [E66.01] 04/03/2015  . Borderline personality disorder (HCC) [F60.3] 03/14/2015  . Allergic rhinitis [J30.9] 10/08/2014  . GERD (gastroesophageal reflux disease) [K21.9] 10/08/2014  . H/O ASTHMA [J45.909] 10/08/2014  . INTERMITTENT URTICARIA [Z87.2] 10/08/2014  . Posttraumatic stress disorder [F43.10] 09/04/2014  . Sinus tachycardia [R00.0] 08/07/2014  . Hypertension [I10] 05/16/2013  . Metabolic  syndrome [E88.81] 09/04/2012  . GAD (generalized anxiety disorder) [F41.1] 07/19/2012  . ODD (oppositional defiant disorder) [F91.3] 07/19/2012  . Obstructive sleep apnea [G47.33] 07/19/2012  . MDD (major depressive disorder), recurrent episode, moderate (HCC) [F33.1] 03/01/2012   Total Time spent with patient: 30 minutes  Past Psychiatric History: See H&P  Past Medical History:  Past Medical History:  Diagnosis Date  . Allergic rhinitis   . Anxiety   . Depression   . GERD (gastroesophageal reflux disease)   . HTN (hypertension) 07/13/12  . IBS (irritable bowel syndrome)   . Obesity   . OSA on CPAP   . Psoriasis     Past Surgical History:  Procedure Laterality Date  . ADENOIDECTOMY    . SINOSCOPY    . TONSILLECTOMY AND ADENOIDECTOMY  Age 22   Deviated septum corrected at same time   Family History:  Family History  Problem Relation Age of Onset  . Depression Mother   . Allergic rhinitis Mother   . Depression Maternal Grandmother   . Diabetes Maternal Grandmother   . Hypertension Maternal Grandmother   . Bipolar disorder Father   . Drug abuse Father   . Hypertension Father    Family Psychiatric  History: See H&P Social History:  Social History   Substance and Sexual Activity  Alcohol Use No     Social History   Substance and Sexual Activity  Drug Use No    Social History   Socioeconomic History  . Marital status: Single    Spouse name: None  .  Number of children: None  . Years of education: None  . Highest education level: None  Social Needs  . Financial resource strain: None  . Food insecurity - worry: None  . Food insecurity - inability: None  . Transportation needs - medical: None  . Transportation needs - non-medical: None  Occupational History  . Occupation: STUDENT    Employer: [employer]    Comment: 11th grade [employer]  Tobacco Use  . Smoking status: Never Smoker  . Smokeless tobacco: Never Used  Substance and Sexual Activity  .  Alcohol use: No  . Drug use: No  . Sexual activity: No  Other Topics Concern  . None  Social History Narrative  . None   Additional Social History:    Pain Medications: see MAR Prescriptions: see MAR Over the Counter: see MAR History of alcohol / drug use?: No history of alcohol / drug abuse                    Sleep: Good  Appetite:  Good  Current Medications: Current Facility-Administered Medications  Medication Dose Route Frequency Provider Last Rate Last Dose  . acetaminophen (TYLENOL) tablet 650 mg  650 mg Oral Q6H PRN Laveda Abbe, NP   650 mg at 04/21/17 1036  . albuterol (PROVENTIL HFA;VENTOLIN HFA) 108 (90 Base) MCG/ACT inhaler 2 puff  2 puff Inhalation Q4H PRN Laveda Abbe, NP   2 puff at 04/19/17 [phone]  . alum & mag hydroxide-simeth (MAALOX/MYLANTA) 200-200-20 MG/5ML suspension 30 mL  30 mL Oral Q4H PRN Laveda Abbe, NP      . buPROPion (WELLBUTRIN XL) 24 hr tablet 150 mg  150 mg Oral Daily Aranda Bihm, Gerlene Burdock, FNP   150 mg at 04/21/17 0758  . carvedilol (COREG) tablet 25 mg  25 mg Oral Daily Cobos, Rockey Situ, MD   25 mg at 04/21/17 0757  . divalproex (DEPAKOTE ER) 24 hr tablet 250 mg  250 mg Oral Daily Kadeidra Coryell, Gerlene Burdock, FNP   250 mg at 04/21/17 0757  . haloperidol (HALDOL) tablet 5 mg  5 mg Oral Q6H PRN Kerry Hough, PA-C   5 mg at 04/20/17 2158  . hydrOXYzine (ATARAX/VISTARIL) tablet 25 mg  25 mg Oral TID PRN Laveda Abbe, NP   25 mg at 04/21/17 1308  . levothyroxine (SYNTHROID, LEVOTHROID) tablet 25 mcg  25 mcg Oral QAC breakfast Laveda Abbe, NP   25 mcg at 04/21/17 [phone]  . loperamide (IMODIUM) capsule 2 mg  2 mg Oral PRN Nira Conn A, NP   2 mg at 04/20/17 1512  . loratadine (CLARITIN) tablet 10 mg  10 mg Oral Daily Laveda Abbe, NP   10 mg at 04/21/17 0757  . magnesium hydroxide (MILK OF MAGNESIA) suspension 30 mL  30 mL Oral Daily PRN Laveda Abbe, NP      . montelukast (SINGULAIR) tablet 10 mg   10 mg Oral QHS Laveda Abbe, NP   10 mg at 04/20/17 2158  . [START ON 05/05/2017] paliperidone (INVEGA SUSTENNA) injection 234 mg  234 mg Intramuscular Once Julicia Krieger, Gerlene Burdock, FNP      . pantoprazole (PROTONIX) EC tablet 40 mg  40 mg Oral BID Laveda Abbe, NP   40 mg at 04/21/17 0757  . prazosin (MINIPRESS) capsule 2 mg  2 mg Oral QHS Laveda Abbe, NP   2 mg at 04/20/17 2158  . traZODone (DESYREL) tablet 100 mg  100  mg Oral QHS PRN Maicey Barrientez, Gerlene Burdockravis B, FNP   100 mg at 04/20/17 2158    Lab Results: No results found for this or any previous visit (from the past 48 hour(s)).  Blood Alcohol level:  Lab Results  Component [facility]   [facility]    [facility]        LOWEST DETECTABLE LIMIT FOR SERUM ALCOHOL IS 5 mg/dL FOR MEDICAL PURPOSES ONLY    Metabolic Disorder Labs: Lab Results  Component Value Date   HGBA1C 5.1 04/17/2017   MPG 99.67 04/17/2017   MPG 100 02/25/2008   No results found for: PROLACTIN Lab Results  Component Value Date   CHOL 155 04/17/2017   TRIG 135 04/17/2017   HDL 46 04/17/2017   CHOLHDL 3.4 04/17/2017   VLDL 27 04/17/2017   LDLCALC 82 04/17/2017   LDLCALC 93 03/01/2012    Physical Findings: AIMS: Facial and Oral Movements Muscles of Facial Expression: None, normal Lips and Perioral Area: None, normal Jaw: None, normal Tongue: None, normal,Extremity Movements Upper (arms, wrists, hands, fingers): None, normal Lower (legs, knees, ankles, toes): None, normal, Trunk Movements Neck, shoulders, hips: None, normal, Overall Severity Severity of abnormal movements (highest score from questions above): None, normal Incapacitation due to abnormal movements: None, normal Patient's awareness of abnormal movements (rate only patient's report): No Awareness, Dental Status Current problems with teeth and/or dentures?: No Does patient usually wear dentures?: No  CIWA:    COWS:     Musculoskeletal: Strength & Muscle Tone: within normal  limits Gait & Station: normal Patient leans: N/A  Psychiatric Specialty Exam: Physical Exam  Nursing note and vitals reviewed. Constitutional: She is oriented to person, place, and time. She appears well-developed and well-nourished.  Respiratory: Effort normal.  Musculoskeletal: Normal range of motion.  Neurological: She is alert and oriented to person, place, and time.  Skin: Skin is warm.    Review of Systems  Constitutional: Negative.   HENT: Negative.   Eyes: Negative.   Respiratory: Negative.   Cardiovascular: Negative.   Gastrointestinal: Positive for diarrhea.  Genitourinary: Negative.   Musculoskeletal: Negative.   Skin: Negative.   Endo/Heme/Allergies: Negative.   Psychiatric/Behavioral: Positive for depression. Negative for hallucinations, substance abuse and suicidal ideas. The patient is nervous/anxious.     Blood pressure (!) 122/59, pulse (!) 111, temperature 98.3 F (36.8 C), resp. rate 18, height 5\' 3"  (1.6 m), weight (!) 201.9 kg (445 lb).Body mass index is 78.83 kg/m.  General Appearance: Casual  Eye Contact:  Good  Speech:  Clear and Coherent and Slow  Volume:  Decreased  Mood:  Depressed  Affect:  Depressed and Flat  Thought Process:  Linear and Descriptions of Associations: Intact  Orientation:  Full (Time, Place, and Person)  Thought Content:  WDL  Suicidal Thoughts:  No  Homicidal Thoughts:  No  Memory:  Immediate;   Good Recent;   Fair Remote;   Poor  Judgement:  Fair  Insight:  Fair  Psychomotor Activity:  Normal  Concentration:  Concentration: Fair and Attention Span: Fair  Recall:  Good  Fund of Knowledge:  Fair  Language:  Good  Akathisia:  No  Handed:  Right  AIMS (if indicated):     Assets:  Communication Skills Desire for Improvement Financial Resources/Insurance Housing Social Support Transportation  ADL's:  Intact  Cognition:  WNL  Sleep:  Number of Hours: 6   Problems Addressed: Borderline Personality Bipolar  I PTSD  Treatment Plan Summary: Daily contact with patient  to assess and evaluate symptoms and progress in treatment, Medication management and Plan is to:  -Continue Depakote 250 mg PO Daily for mood stability -Increase Wellbutrin XL 300 mg PO Daily for mood stability -Continue Haldol 5 mg PO Q6H PRN for agitation -Continue Trazodone 100 mg PO QH SPRN for insomnia -Continue Vistaril 25 mg PO TID PRN for anxiety -Continue Prazosin 2 mg PO QHS for nightmares -Invega Sustenna 234 mg IM Q28 Days confirmed given on 04-01-17 next due on 04-29-17 -Encourage group therapy participation   Maryfrances Bunnell, FNP 04/21/2017, 3:20 PM

## 2017-04-22 DIAGNOSIS — J45909 Unspecified asthma, uncomplicated: Secondary | ICD-10-CM

## 2017-04-22 DIAGNOSIS — Z6841 Body Mass Index (BMI) 40.0 and over, adult: Secondary | ICD-10-CM

## 2017-04-22 DIAGNOSIS — E669 Obesity, unspecified: Secondary | ICD-10-CM

## 2017-04-22 DIAGNOSIS — I10 Essential (primary) hypertension: Secondary | ICD-10-CM

## 2017-04-22 NOTE — [facility] Group Notes (Signed)
  [facility]  Date/Time: 04/22/17, 1315  Type of Therapy/Topic:  Group Therapy:  Emotion Regulation  Participation Level:  Active   Mood: pleasant  Description of Group:    The purpose of this group is to assist patients in learning to regulate negative emotions and experience positive emotions. Patients will be guided to discuss ways in which they have been vulnerable to their negative emotions. These vulnerabilities will be juxtaposed with experiences of positive emotions or situations, and patients challenged to use positive emotions to combat negative ones. Special emphasis will be placed on coping with negative emotions in conflict situations, and patients will process healthy conflict resolution skills.  Therapeutic Goals: 1. Patient will identify two positive emotions or experiences to reflect on in order to balance out negative emotions:  2. Patient will label two or more emotions that they find the most difficult to experience:  3. Patient will be able to demonstrate positive conflict resolution skills through discussion or role plays:   Summary of Patient Progress:Pt shared that depression, anxiety, and anger are difficult emotions for her to experience.  She made good effort to participate in the discussion about positive ways to deal with difficult emotions.       Therapeutic Modalities:   Cognitive Behavioral Therapy Feelings Identification Dialectical Behavioral Therapy  Daleen SquibbGreg Andric Kerce, LCSW

## 2017-04-22 NOTE — Progress Notes (Signed)
D: Patient presents with anxious mood; she denies any physical distress.  She brightens upon approach and is talkative with staff.  She rates her depression and hopelessness as a 5; anxiety as a 10.  Patient continues to wait for a bed at a group home, as she does not feel safe going home.  She denies any thoughts of self harm.  She slept over 6 hours last night and her appetite is good.  Her energy level is low and her concentration is poor.  Her goal today is to "work on my anxiety."    A: Continue to monitor medication management and MD orders.  Safety checks completed every 15 minutes per protocol.  Offer support and encouragement as needed.  R: Patient is receptive to staff; her behavior is appropriate.

## 2017-04-22 NOTE — Plan of Care (Signed)
  Progressing Activity: Interest or engagement in activities will improve 04/22/2017 1631 - Progressing by Angela AdamBeaudry, Kasey Ewings E, RN Sleeping patterns will improve 04/22/2017 1631 - Progressing by Angela AdamBeaudry, Eluterio Seymour E, RN Education: Mental status will improve 04/22/2017 1631 - Progressing by Angela AdamBeaudry, Zaydon Kinser E, RN   Progressing Activity: Interest or engagement in activities will improve 04/22/2017 1632 - Progressing by Angela AdamBeaudry, Tomiko Schoon E, RN 04/22/2017 1631 - Progressing by Angela AdamBeaudry, Maleiah Dula E, RN Sleeping patterns will improve 04/22/2017 1632 - Progressing by Angela AdamBeaudry, Arrion Broaddus E, RN 04/22/2017 1631 - Progressing by Angela AdamBeaudry, Jacalynn Buzzell E, RN Education: Knowledge of [facility] Education information/materials will improve 04/22/2017 1632 - Progressing by Angela AdamBeaudry, Myliyah Rebuck E, RN 04/22/2017 1631 - Not Progressing by Angela AdamBeaudry, Vitaliy Eisenhour E, RN Emotional status will improve 04/22/2017 1632 - Progressing by Angela AdamBeaudry, Sherby Moncayo E, RN 04/22/2017 1631 - Not Progressing by Angela AdamBeaudry, Zariah Jost E, RN Mental status will improve 04/22/2017 1631 - Progressing by Angela AdamBeaudry, Ellias Mcelreath E, RN

## 2017-04-22 NOTE — Progress Notes (Signed)
Adult Psychoeducational Group Note  Date:  04/22/2017 Time:  8:42 PM  Group Topic/Focus:  Wrap-Up Group:   The focus of this group is to help patients review their daily goal of treatment and discuss progress on daily workbooks.  Participation Level:  Active  Participation Quality:  Appropriate  Affect:  Appropriate  Cognitive:  Appropriate  Insight: Appropriate  Engagement in Group:  Engaged  Modes of Intervention:  Discussion  Additional Comments:  Patient attended group and participated.  Tate Zagal W Tammee Thielke 04/22/2017, 8:42 PM

## 2017-04-22 NOTE — Tx Team (Signed)
Interdisciplinary Treatment and Diagnostic Plan Update  04/22/2017 Time of Session: 0945 Misty NissenKristal [name] MRN: [id number]  Principal Diagnosis: Bipolar I disorder (HCC)  Secondary Diagnoses: Principal Problem:   Bipolar I disorder (HCC) Active Problems:   Borderline personality disorder (HCC)   Posttraumatic stress disorder   Current Medications:  Current Facility-Administered Medications  Medication Dose Route Frequency Provider Last Rate Last Dose  . acetaminophen (TYLENOL) tablet 650 mg  650 mg Oral Q6H PRN Laveda AbbeParks, Laurie Britton, NP   650 mg at 04/21/17 2235  . albuterol (PROVENTIL HFA;VENTOLIN HFA) 108 (90 Base) MCG/ACT inhaler 2 puff  2 puff Inhalation Q4H PRN Laveda AbbeParks, Laurie Britton, NP   2 puff at 04/19/17 [phone]  . alum & mag hydroxide-simeth (MAALOX/MYLANTA) 200-200-20 MG/5ML suspension 30 mL  30 mL Oral Q4H PRN Laveda AbbeParks, Laurie Britton, NP      . buPROPion (WELLBUTRIN XL) 24 hr tablet 300 mg  300 mg Oral Daily Money, Gerlene Burdockravis B, FNP   300 mg at 04/22/17 0755  . carvedilol (COREG) tablet 25 mg  25 mg Oral Daily Cobos, Rockey SituFernando A, MD   25 mg at 04/22/17 0755  . divalproex (DEPAKOTE ER) 24 hr tablet 250 mg  250 mg Oral Daily Money, Gerlene Burdockravis B, FNP   250 mg at 04/22/17 0755  . haloperidol (HALDOL) tablet 5 mg  5 mg Oral Q6H PRN Kerry HoughSimon, Spencer E, PA-C   5 mg at 04/21/17 2204  . hydrOXYzine (ATARAX/VISTARIL) tablet 25 mg  25 mg Oral TID PRN Laveda AbbeParks, Laurie Britton, NP   25 mg at 04/21/17 2204  . levothyroxine (SYNTHROID, LEVOTHROID) tablet 25 mcg  25 mcg Oral QAC breakfast Laveda AbbeParks, Laurie Britton, NP   25 mcg at 04/22/17 0617  . loperamide (IMODIUM) capsule 2 mg  2 mg Oral PRN Nira ConnBerry, Jason A, NP   2 mg at 04/21/17 2106  . loratadine (CLARITIN) tablet 10 mg  10 mg Oral Daily Laveda AbbeParks, Laurie Britton, NP   10 mg at 04/22/17 0755  . magnesium hydroxide (MILK OF MAGNESIA) suspension 30 mL  30 mL Oral Daily PRN Laveda AbbeParks, Laurie Britton, NP      . montelukast (SINGULAIR) tablet 10 mg  10 mg Oral QHS Laveda AbbeParks, Laurie  Britton, NP   10 mg at 04/21/17 2204  . [START ON 05/05/2017] paliperidone (INVEGA SUSTENNA) injection 234 mg  234 mg Intramuscular Once Money, Gerlene Burdockravis B, FNP      . pantoprazole (PROTONIX) EC tablet 40 mg  40 mg Oral BID Laveda AbbeParks, Laurie Britton, NP   40 mg at 04/22/17 0755  . prazosin (MINIPRESS) capsule 2 mg  2 mg Oral QHS Laveda AbbeParks, Laurie Britton, NP   2 mg at 04/21/17 2204  . traZODone (DESYREL) tablet 100 mg  100 mg Oral QHS PRN Money, Gerlene Burdockravis B, FNP   100 mg at 04/21/17 2204   PTA Medications: Facility-Administered Medications Prior to Admission  Medication Dose Route Frequency Provider Last Rate Last Dose  . Mepolizumab SOLR 100 mg  100 mg Subcutaneous Q28 days Jessica PriestKozlow, Eric J, MD   100 mg at 04/13/17 1335   Medications Prior to Admission  Medication Sig Dispense Refill Last Dose  . albuterol (PROVENTIL HFA) 108 (90 Base) MCG/ACT inhaler Inhale 2 puffs into the lungs every 4 (four) hours as needed.     . carvedilol (COREG) 25 MG tablet Take 25 mg by mouth 2 (two) times daily.     . divalproex (DEPAKOTE ER) 250 MG 24 hr tablet Take 250 mg by mouth at bedtime.     .Marland Kitchen  EPINEPHrine 0.3 mg/0.3 mL IJ SOAJ injection Inject 0.3 mg into the muscle daily as needed.     . ferrous sulfate (FEROSUL) 325 (65 FE) MG tablet Take 325 mg by mouth daily.     . fexofenadine (ALLEGRA) 180 MG tablet Take 180 mg by mouth at bedtime.     . furosemide (LASIX) 40 MG tablet Take 40 mg by mouth every morning.     . Guselkumab (TREMFYA) 100 MG/ML SOSY Inject 100 mg into the skin every 8 (eight) weeks.     Marland Kitchen lisinopril (PRINIVIL,ZESTRIL) 10 MG tablet Take 10 mg by mouth daily.     . montelukast (SINGULAIR) 10 MG tablet Take 10 mg by mouth at bedtime.     . paliperidone (INVEGA) 6 MG 24 hr tablet Take 6 mg by mouth 2 (two) times daily.     . potassium chloride (K-DUR) 10 MEQ tablet Take 10 mEq by mouth daily.     . prazosin (MINIPRESS) 2 MG capsule Take 2 mg by mouth at bedtime.     . progesterone (PROMETRIUM) 200 MG capsule  Take 200 mg by mouth at bedtime.     Marland Kitchen albuterol (PROVENTIL HFA) 108 (90 Base) MCG/ACT inhaler Inhale two puffs every four to six hours as needed for cough or wheeze. 1 Inhaler 1 Taking  . albuterol (PROVENTIL) (2.5 MG/3ML) 0.083% nebulizer solution Inhale 2.5 mg into the lungs 3 (three) times daily.     Marland Kitchen azelastine (ASTELIN) 0.1 % nasal spray Can use one to two sprays in each nostril one to two times daily as needed. 30 mL 5 Taking  . azelastine (OPTIVAR) 0.05 % ophthalmic solution Place 1 drop into both eyes 2 (two) times daily. 6 mL 3 Taking  . buPROPion (WELLBUTRIN XL) 300 MG 24 hr tablet Take 1 tablet (300 mg total) by mouth daily. 90 tablet 0 Taking  . busPIRone (BUSPAR) 15 MG tablet Take 15 mg by mouth 2 (two) times daily.  0 Taking  . busPIRone (BUSPAR) 15 MG tablet Take 15 mg by mouth 2 (two) times daily.     . carvedilol (COREG) 25 MG tablet TAKE ONE TABLET BY MOUTH TWICE DAILY WITH meals 180 tablet 1   . Cholecalciferol (VITAMIN D) 2000 units tablet Take 2,000 Units by mouth daily.   Taking  . divalproex (DEPAKOTE ER) 250 MG 24 hr tablet TAKE ONE TABLET BY MOUTH AT BEDTIME FOR 7 DAYS, THEN TAKE TWO TABLETS AT BEDTIME  6 Taking  . EPINEPHrine 0.3 mg/0.3 mL IJ SOAJ injection Use as directed for life threatening allergic reaction 2 Device 3 Taking  . ferrous sulfate 325 (65 FE) MG tablet See admin instructions.  99 Taking  . fexofenadine (ALLEGRA) 180 MG tablet TAKE ONE TABLET BY MOUTH DAILY 90 tablet 0   . furosemide (LASIX) 40 MG tablet Take 1 tablet (40 mg total) by mouth 2 (two) times daily. 60 tablet 3   . ipratropium-albuterol (DUONEB) 0.5-2.5 (3) MG/3ML SOLN Take 3 mLs by nebulization every 6 (six) hours as needed.   Taking  . ipratropium-albuterol (DUONEB) 0.5-2.5 (3) MG/3ML SOLN Inhale 3 mLs into the lungs every 6 (six) hours as needed.     . lamoTRIgine (LAMICTAL) 25 MG tablet Take 2 tablets (50 mg total) by mouth 2 (two) times daily. 60 tablet 0 Taking  . lisinopril  (PRINIVIL,ZESTRIL) 10 MG tablet TAKE ONE TABLET BY MOUTH EVERY DAY 90 tablet 1   . lisinopril (PRINIVIL,ZESTRIL) 10 MG tablet Take 10 mg by mouth  daily.     . mometasone-formoterol (DULERA) 200-5 MCG/ACT AERO Inhale two puffs twice a day to prevent cough or wheeze.  Rinse, gargle, and spit after use. 1 Inhaler 0   . mometasone-formoterol (DULERA) 200-5 MCG/ACT AERO Inhale 2 puffs into the lungs 2 (two) times daily.     . montelukast (SINGULAIR) 10 MG tablet Take 10 mg at bedtime by mouth.  5 Taking  . MULTIPLE MINERALS-VITAMINS PO Take 1 tablet by mouth daily.     . Multiple Vitamin (MULTIVITAMIN WITH MINERALS) TABS tablet Take 1 tablet by mouth daily.   Taking  . Olopatadine HCl (PATADAY) 0.2 % SOLN Place 1 drop into both eyes 1 day or 1 dose. 1 Bottle 5 Taking  . omeprazole (PRILOSEC) 40 MG capsule Take one capsule by mouth twice a day. 60 capsule 5 Taking  . omeprazole (PRILOSEC) 40 MG capsule Take 40 mg by mouth 2 (two) times daily.     . paliperidone (INVEGA) 6 MG 24 hr tablet Take 6 mg 2 (two) times daily by mouth.   0 Taking  . potassium chloride 20 MEQ TBCR Take 1/2 tablet by mouth daily. 30 tablet 11   . prazosin (MINIPRESS) 1 MG capsule Take 2 mg by mouth at bedtime.   Taking  . progesterone (PROMETRIUM) 200 MG capsule Take 200 mg by mouth at bedtime.  1 Taking  . ranitidine (ZANTAC) 300 MG tablet Take 1 tablet (300 mg total) by mouth at bedtime. 90 tablet 1 Taking  . ranitidine (ZANTAC) 300 MG tablet Take 300 mg by mouth at bedtime.     . Tiotropium Bromide Monohydrate (SPIRIVA RESPIMAT IN) Inhale into the lungs.     . topiramate (TOPAMAX) 100 MG tablet Take 1 tablet daily by mouth.   Taking  . topiramate (TOPAMAX) 25 MG tablet Take 100 mg by mouth daily.     Marland Kitchen TREMFYA 100 MG/ML SOSY INJECT ONE syringe Subcutaneously every EIGHT WEEKS (AFTER starter doses at WEEKS 0 and 4)  3 Taking  . VIBERZI 100 MG TABS Take 1 tablet by mouth 2 (two) times daily.  5 Taking  . Vitamin D,  Ergocalciferol, (DRISDOL) 50000 units CAPS capsule TAKE ONE CAPSULE BY MOUTH ONCE WEEKLY FOR EIGHT WEEKS  3 Taking  . VRAYLAR 4.5 MG CAPS Take 1 capsule by mouth daily.  1   . zonisamide (ZONEGRAN) 100 MG capsule Take 3 tablets by mouth at bedtime  6 Taking  . zonisamide (ZONEGRAN) 25 MG capsule Take 300 mg by mouth at bedtime.       Patient Stressors: Health problems Marital or family conflict Medication change or noncompliance Traumatic event  Patient Strengths: Wellsite geologist fund of knowledge Motivation for treatment/growth  Treatment Modalities: Medication Management, Group therapy, Case management,  1 to 1 session with clinician, Psychoeducation, Recreational therapy.   Physician Treatment Plan for Primary Diagnosis: Bipolar I disorder (HCC) Long Term Goal(s): Improvement in symptoms so as ready for discharge Improvement in symptoms so as ready for discharge   Short Term Goals: Ability to verbalize feelings will improve Ability to disclose and discuss suicidal ideas Ability to demonstrate self-control will improve Ability to identify and develop effective coping behaviors will improve Ability to maintain clinical measurements within normal limits will improve Compliance with prescribed medications will improve  Medication Management: Evaluate patient's response, side effects, and tolerance of medication regimen.  Therapeutic Interventions: 1 to 1 sessions, Unit Group sessions and Medication administration.  Evaluation of Outcomes: Progressing  Physician Treatment Plan  for Secondary Diagnosis: Principal Problem:   Bipolar I disorder (HCC) Active Problems:   Borderline personality disorder (HCC)   Posttraumatic stress disorder  Long Term Goal(s): Improvement in symptoms so as ready for discharge Improvement in symptoms so as ready for discharge   Short Term Goals: Ability to verbalize feelings will improve Ability to disclose and discuss suicidal  ideas Ability to demonstrate self-control will improve Ability to identify and develop effective coping behaviors will improve Ability to maintain clinical measurements within normal limits will improve Compliance with prescribed medications will improve     Medication Management: Evaluate patient's response, side effects, and tolerance of medication regimen.  Therapeutic Interventions: 1 to 1 sessions, Unit Group sessions and Medication administration.  Evaluation of Outcomes: Progressing   RN Treatment Plan for Primary Diagnosis: Bipolar I disorder (HCC) Long Term Goal(s): Knowledge of disease and therapeutic regimen to maintain health will improve  Short Term Goals: Ability to remain free from injury will improve, Ability to verbalize frustration and anger appropriately will improve, Ability to demonstrate self-control, Ability to participate in decision making will improve, Ability to verbalize feelings will improve, Ability to disclose and discuss suicidal ideas, Ability to identify and develop effective coping behaviors will improve and Compliance with prescribed medications will improve  Medication Management: RN will administer medications as ordered by provider, will assess and evaluate patient's response and provide education to patient for prescribed medication. RN will report any adverse and/or side effects to prescribing provider.  Therapeutic Interventions: 1 on 1 counseling sessions, Psychoeducation, Medication administration, Evaluate responses to treatment, Monitor vital signs and CBGs as ordered, Perform/monitor CIWA, COWS, AIMS and Fall Risk screenings as ordered, Perform wound care treatments as ordered.  Evaluation of Outcomes: Progressing   LCSW Treatment Plan for Primary Diagnosis: Bipolar I disorder (HCC) Long Term Goal(s): Safe transition to appropriate next level of care at discharge, Engage patient in therapeutic group addressing interpersonal concerns.  Short  Term Goals: Engage patient in aftercare planning with referrals and resources, Increase social support, Increase ability to appropriately verbalize feelings, Increase emotional regulation, Facilitate acceptance of mental health diagnosis and concerns, Facilitate patient progression through stages of change regarding substance use diagnoses and concerns, Identify triggers associated with mental health/substance abuse issues and Increase skills for wellness and recovery  Therapeutic Interventions: Assess for all discharge needs, 1 to 1 time with Social worker, Explore available resources and support systems, Assess for adequacy in community support network, Educate family and significant other(s) on suicide prevention, Complete Psychosocial Assessment, Interpersonal group therapy.  Evaluation of Outcomes: Progressing   Progress in Treatment: Attending groups: Yes. Participating in groups: Yes. Taking medication as prescribed: Yes. Toleration medication: Yes. Family/Significant other contact made: Yes, individual(s) contacted:  pt mother Patient understands diagnosis: Yes.  Discussing patient identified problems/goals with staff: Yes. Medical problems stabilized or resolved: Yes. Denies suicidal/homicidal ideation: No. Issues/concerns per patient self-inventory: No. Other:   New problem(s) identified: NONE  New Short Term/Long Term Goal(s): medication stabilization, elimination of SI thoughts, development of comprehensive mental wellness plan.   Patient Goals: "I want to go to a group home at discharge due to family conflict at home. I also want to get on the right medications and learn more coping skills"  Discharge Plan or Barriers: CSW will continue to assess for an appropriate discharge plan regarding the patient's living arrangement. The patient will continue to follow up with her [facility] team for medication management and therapy services. Patient also has a PCP Yetta Flock and  [facility].   Reason for Continuation of Hospitalization: Anxiety Depression Medication stabilization Suicidal ideation  Estimated Length of Stay: 1-3 days.  Attendees: Patient: 04/22/2017   Physician: Dr Jackquline Berlin, MD 04/22/2017   Nursing: Joslyn Devon, RN 04/22/2017   RN Care Manager: 04/22/2017   Social Worker: Daleen Squibb, LCSW 04/22/2017   Recreational Therapist:  04/22/2017   Other: Enid Cutter, intern 04/22/2017   Other:  04/22/2017   Other: 04/22/2017        Scribe for Treatment Team: Lorri Frederick, LCSW 04/22/2017 10:29 AM

## 2017-04-22 NOTE — Progress Notes (Signed)
Recreation Therapy Notes  Date: 3.15.19 Time: 9:30 a.m. Location: [address] Dayroom  Group Topic: Stress Management  Goal Area(s) Addresses:  Goal 1.1: To reduce stress  -Patient will report feeling a reduction in stress level  -Patient will identify the importance of stress management  -Patient will participate during stress management group treatment    Intervention: Stress Management  Activity: Yoga- Recreation Therapy Intern instructed a yoga group. Patients were in a peaceful environment with soft lighting enhancing patients mood.   Education: Stress Management, Discharge Planning.   Education Outcome: Acknowledges edcuation/In group clarification offered/Needs additional education  Clinical Observations/Feedback:: Patient did not attend    Misty [name], Recreation Therapy Intern   Misty [name] 04/22/2017 8:20 AM 

## 2017-04-22 NOTE — Progress Notes (Signed)
Patient ID: Misty [name], female   DOB: Jun 23, 1995, 22 y.o.   MRN: [id number]  Pt currently presents with a sad, masked affect and guarded behavior. Pt seen sitting in the hallway tonight. Reports she feels hopeless, "I don't want to do this anymore." Pt asks writer to tell MD about her feelings as she forgot to tell him today. Pt reports good sleep with current medication regimen.   Pt provided with medications per providers orders. Pt's labs and vitals were monitored throughout the night. Pt given a 1:1 about emotional and mental status. Pt supported and encouraged to express concerns and questions. Pt reports that she will go to the dayroom and color as a coping skill for current rumination and sad.  Pt educated on medications.  Pt's safety ensured with 15 minute and environmental checks. Pt passive SI, no plan. Pt currently denies HI and A/V hallucinations. Pt verbally agrees to seek staff if SI worsens, HI or A/VH occurs and to consult with staff before acting on any harmful thoughts. Will continue POC.

## 2017-04-22 NOTE — Progress Notes (Signed)
[facility]  04/22/2017 4:35 PM Misty NissenKristal [name]  MRN:  [id number] Subjective:   22 y.o Caucasian female, single, lives with her family. Background history of Borderline Personality Disorder,  early life trauma and mood disorder.  Presented to the ER voluntarily. Presented with thoughts of suicide. Reports command auditory hallucination to end her life. Says she has been hearing the voice for many years. Main stressor is conflict with her family. She feels they are not supportive enough. Reports that her father is in coma. She is linked to ACTT but has been getting psychotropic medications also from his PCP. She has been pruned from a lot of her psychoactive agents during this hospital stay.  Chart reviewed today. Patient discussed at team today.  Staff reports that she has been appropriate on the unit. She is interacting with peers. She is taking her medications as prescribed. No side effects reported or observed.  Seen today. Patient tells me that her 19thirteen year old brother picks on her at home. Says he makes fun of her weight. Says her parents tells her to ignore him but she is not able to just sweep it underneath the carpet. Says she is constantly having arguments with her family. Says she hopes to go to a group home. Her plan is for one about thirty minutes away from her family. Tells me that she has not had any suicidal thoughts lately. She has not had any hallucinations lately. No feeling of things being unreal lately. She has been tolerating her medications well. Encouraged.        Principal Problem: Bipolar I disorder (HCC) Diagnosis:   Patient Active Problem List   Diagnosis Date Noted  . Bipolar 1 disorder (HCC) [F31.9] 04/15/2017  . Swelling of both lower extremities [M79.89] 12/14/2016  . Lithium toxicity [T56.891A] 04/05/2016  . Fatty liver [K76.0] 10/10/2015  . Iron deficiency [E61.1] 10/10/2015  . Bipolar I disorder (HCC) [F31.9] 10/10/2015  . Palpitations [R00.2]  07/21/2015  . Morbid obesity (HCC) [E66.01] 04/03/2015  . Borderline personality disorder (HCC) [F60.3] 03/14/2015  . Allergic rhinitis [J30.9] 10/08/2014  . GERD (gastroesophageal reflux disease) [K21.9] 10/08/2014  . H/O ASTHMA [J45.909] 10/08/2014  . INTERMITTENT URTICARIA [Z87.2] 10/08/2014  . Posttraumatic stress disorder [F43.10] 09/04/2014  . Sinus tachycardia [R00.0] 08/07/2014  . Hypertension [I10] 05/16/2013  . Metabolic syndrome [E88.81] 09/04/2012  . GAD (generalized anxiety disorder) [F41.1] 07/19/2012  . ODD (oppositional defiant disorder) [F91.3] 07/19/2012  . Obstructive sleep apnea [G47.33] 07/19/2012  . MDD (major depressive disorder), recurrent episode, moderate (HCC) [F33.1] 03/01/2012   Total Time spent with patient: 20 minutes  Past Psychiatric History: As in H&P  Past Medical History:  Past Medical History:  Diagnosis Date  . Allergic rhinitis   . Anxiety   . Depression   . GERD (gastroesophageal reflux disease)   . HTN (hypertension) 07/13/12  . IBS (irritable bowel syndrome)   . Obesity   . OSA on CPAP   . Psoriasis     Past Surgical History:  Procedure Laterality Date  . ADENOIDECTOMY    . SINOSCOPY    . TONSILLECTOMY AND ADENOIDECTOMY  Age 22   Deviated septum corrected at same time   Family History:  Family History  Problem Relation Age of Onset  . Depression Mother   . Allergic rhinitis Mother   . Depression Maternal Grandmother   . Diabetes Maternal Grandmother   . Hypertension Maternal Grandmother   . Bipolar disorder Father   . Drug abuse Father   .  Hypertension Father    Family Psychiatric  History: As in H&P Social History:  Social History   Substance and Sexual Activity  Alcohol Use No     Social History   Substance and Sexual Activity  Drug Use No    Social History   Socioeconomic History  . Marital status: Single    Spouse name: None  . Number of children: None  . Years of education: None  . Highest education  level: None  Social Needs  . Financial resource strain: None  . Food insecurity - worry: None  . Food insecurity - inability: None  . Transportation needs - medical: None  . Transportation needs - non-medical: None  Occupational History  . Occupation: STUDENT    Employer: [employer]    Comment: 11th grade [employer]  Tobacco Use  . Smoking status: Never Smoker  . Smokeless tobacco: Never Used  Substance and Sexual Activity  . Alcohol use: No  . Drug use: No  . Sexual activity: No  Other Topics Concern  . None  Social History Narrative  . None   Additional Social History:    Pain Medications: see MAR Prescriptions: see MAR Over the Counter: see MAR History of alcohol / drug use?: No history of alcohol / drug abuse                    Sleep: Good  Appetite:  Good  Current Medications: Current Facility-Administered Medications  Medication Dose Route Frequency Provider Last Rate Last Dose  . acetaminophen (TYLENOL) tablet 650 mg  650 mg Oral Q6H PRN Laveda Abbe, NP   650 mg at 04/22/17 1139  . albuterol (PROVENTIL HFA;VENTOLIN HFA) 108 (90 Base) MCG/ACT inhaler 2 puff  2 puff Inhalation Q4H PRN Laveda Abbe, NP   2 puff at 04/19/17 [phone]  . alum & mag hydroxide-simeth (MAALOX/MYLANTA) 200-200-20 MG/5ML suspension 30 mL  30 mL Oral Q4H PRN Laveda Abbe, NP      . buPROPion (WELLBUTRIN XL) 24 hr tablet 300 mg  300 mg Oral Daily Money, Gerlene Burdock, FNP   300 mg at 04/22/17 0755  . carvedilol (COREG) tablet 25 mg  25 mg Oral Daily Cobos, Rockey Situ, MD   25 mg at 04/22/17 0755  . divalproex (DEPAKOTE ER) 24 hr tablet 250 mg  250 mg Oral Daily Money, Gerlene Burdock, FNP   250 mg at 04/22/17 0755  . haloperidol (HALDOL) tablet 5 mg  5 mg Oral Q6H PRN Kerry Hough, PA-C   5 mg at 04/21/17 2204  . hydrOXYzine (ATARAX/VISTARIL) tablet 25 mg  25 mg Oral TID PRN Laveda Abbe, NP   25 mg at 04/22/17 1308  . levothyroxine (SYNTHROID, LEVOTHROID)  tablet 25 mcg  25 mcg Oral QAC breakfast Laveda Abbe, NP   25 mcg at 04/22/17 0617  . loperamide (IMODIUM) capsule 2 mg  2 mg Oral PRN Nira Conn A, NP   2 mg at 04/21/17 2106  . loratadine (CLARITIN) tablet 10 mg  10 mg Oral Daily Laveda Abbe, NP   10 mg at 04/22/17 0755  . magnesium hydroxide (MILK OF MAGNESIA) suspension 30 mL  30 mL Oral Daily PRN Laveda Abbe, NP      . montelukast (SINGULAIR) tablet 10 mg  10 mg Oral QHS Laveda Abbe, NP   10 mg at 04/21/17 2204  . [START ON 05/05/2017] paliperidone (INVEGA SUSTENNA) injection 234 mg  234 mg Intramuscular  Once Money, Gerlene Burdock, FNP      . pantoprazole (PROTONIX) EC tablet 40 mg  40 mg Oral BID Laveda Abbe, NP   40 mg at 04/22/17 0755  . prazosin (MINIPRESS) capsule 2 mg  2 mg Oral QHS Laveda Abbe, NP   2 mg at 04/21/17 2204  . traZODone (DESYREL) tablet 100 mg  100 mg Oral QHS PRN Money, Gerlene Burdock, FNP   100 mg at 04/21/17 2204    Lab Results: No results found for this or any previous visit (from the past 48 hour(s)).  Blood Alcohol level:  Lab Results  Component Value Date   [facility]  06/24/2008    <5        LOWEST DETECTABLE LIMIT FOR SERUM ALCOHOL IS 5 mg/dL FOR MEDICAL PURPOSES ONLY    Metabolic Disorder Labs: Lab Results  Component Value Date   HGBA1C 5.1 04/17/2017   MPG 99.67 04/17/2017   MPG 100 02/25/2008   No results found for: PROLACTIN Lab Results  Component Value Date   CHOL 155 04/17/2017   TRIG 135 04/17/2017   HDL 46 04/17/2017   CHOLHDL 3.4 04/17/2017   VLDL 27 04/17/2017   LDLCALC 82 04/17/2017   LDLCALC 93 03/01/2012    Physical Findings: AIMS: Facial and Oral Movements Muscles of Facial Expression: None, normal Lips and Perioral Area: None, normal Jaw: None, normal Tongue: None, normal,Extremity Movements Upper (arms, wrists, hands, fingers): None, normal Lower (legs, knees, ankles, toes): None, normal, Trunk Movements Neck, shoulders,  hips: None, normal, Overall Severity Severity of abnormal movements (highest score from questions above): None, normal Incapacitation due to abnormal movements: None, normal Patient's awareness of abnormal movements (rate only patient's report): No Awareness, Dental Status Current problems with teeth and/or dentures?: No Does patient usually wear dentures?: No  CIWA:    COWS:     Musculoskeletal: Strength & Muscle Tone: within normal limits Gait & Station: normal Patient leans: N/A  Psychiatric Specialty Exam: Physical Exam  Constitutional: She is oriented to person, place, and time. No distress.  HENT:  Head: Normocephalic and atraumatic.  Respiratory: Effort normal.  Neurological: She is alert and oriented to person, place, and time.  Skin: She is not diaphoretic.  Psychiatric:  As above     ROS  Blood pressure 131/81, pulse (!) 115, temperature 97.6 F (36.4 C), temperature source Oral, resp. rate 20, height 5\' 3"  (1.6 m), weight (!) 201.9 kg (445 lb).Body mass index is 78.83 kg/m.  General Appearance: Obese, withdrawn, not internally stimulated.   Eye Contact:  Good  Speech:  Decreased rate and tone  Volume:  Decreased  Mood:  Depressed  Affect:  Blunted and mood congruent  Thought Process:  Linear  Orientation:  Full (Time, Place, and Person)  Thought Content:  Rumination  Suicidal Thoughts:  None currently  Homicidal Thoughts:  No  Memory:  Immediate;   Good Recent;   Good Remote;   Good  Judgement:  Fair  Insight:  Fair  Psychomotor Activity:  Decreased  Concentration:  Concentration: Good and Attention Span: Good  Recall:  Good  Fund of Knowledge:  Fair  Language:  Good  Akathisia:  Negative  Handed:    AIMS (if indicated):     Assets:  Financial Resources/Insurance  ADL's:  Intact  Cognition:  WNL  Sleep:  Number of Hours: 6.25     Treatment Plan Summary: Patient is not pervasively depressed. She is not a danger to herself or others. She  is no  longer having any psychosis. No dissociation. Conflict at home as her main stressor. We have been trying to have her ACTT get involved in aftercare and disposition. We would evaluate her over the weekend and hopefully discharge her by Monday.   Psychiatric: Borderline Personality Disorder Intellectual Disability Bipolar disorder unspecified.   Medical: Obesity Fatty liver Asthma HTN  Psychosocial:  Family dynamics   PLAN: 1. Continue current regimen 2. Encourage unit groups and therapeutic activities 3. Monitor mood, behavior and interaction with peers 4. SW would coordinate aftercare with her ACTT   Georgiann Cocker, MD 04/22/2017, 4:35 PM

## 2017-04-23 NOTE — [facility] Group Notes (Signed)
Adult Psychoeducational Group Note  Date:  04/23/2017 Time:  8:43 PM  Group Topic/Focus:  Wrap-Up Group:   The focus of this group is to help patients review their daily goal of treatment and discuss progress on daily workbooks.  Participation Level:  Active  Participation Quality:  Appropriate and Attentive  Affect:  Appropriate  Cognitive:  Alert and Appropriate  Insight: Appropriate and Good  Engagement in Group:  Engaged  Modes of Intervention:  Discussion and Education  Additional Comments:  Pt attended and participated in wrap up group this evening. Pt had a rough day due to them having "bad thoughts". Pt goal was to be open about how they feel, even though they feel that everybody is not understanding how they feel. A positive noted by the pt was that they spoke with their mom and they had a good conversation.   Chrisandra NettersOctavia A Edinson Domeier 04/23/2017, 8:43 PM

## 2017-04-23 NOTE — Progress Notes (Signed)
Approached pt after lunch while pt was interacting with peers in dayroom- pt requested to talk alone with this nurse- after inquiring about how she was doing. Pt reported that she wanted to talk to somebody about the thoughts she was having. Pt reports feeling worthless and that no one cares if she lives. Pt endorsed passive SI with no plan and agrees to let staff know when she experiences these feelings again. When asked if there was anything that triggered these feelings, pt reports that when 'positive needs' were being discussed in group, it reminded her that she doesn't have those fulfilled. Sat with pt and listened - offering encouragement.

## 2017-04-23 NOTE — [facility] Group Notes (Signed)
[facility] Group Notes: (Clinical Social Work)   04/23/2017      Type of Therapy:  Group Therapy   Participation Level:  Did Not Attend despite MHT prompting   Codee Bloodworth Grossman-Orr, LCSW 04/23/2017, 12:56 PM     

## 2017-04-23 NOTE — Progress Notes (Signed)
D:  Patient's self inventory sheet, patient sleeps good, sleep medication helpful.  Good appetite, low energy level, poor concentration.  Rated depression 9, hopeless 10 and anxiety 10.  Denied withdrawals.  SI, contracts for safety.  Denied physical problems.  Denied physical pain.  Goal is talk about her feelings to someone.  No discharge plans. A:  Medications administered per MD orders.  Emotional support and encouragement given patient. R:  Denied HI.  Denied A/V hallucinations.  SI, off/on, contracts for safety, [facility]. Patient wrote her thoughts/feelings in journal about her depression.  Patient stated she continues to have SI thoughts.  Encouraged patient to get out of the house occasionally, consider volunteer work, [employer]c.  Will discuss feelings with staff.

## 2017-04-23 NOTE — Plan of Care (Signed)
Nurse discussed depression, anxiety, coping skills with patient.  

## 2017-04-23 NOTE — Progress Notes (Signed)
Writer observed patient up in the dayroom watching tv. Writer spoke with her 1:1 and she reports that she has been very anxious today and having thoughts to harm herself. Writer offered her vistaril for her anxiety but she declined reporting that she wanted to take it at bedtime. She reports being in the dayroom so that she didn't focus on her thoughts. She verbally contracts for safety and reports that she has been using her coping skill learned in group. She did write a letter about her thoughts which was placed on her chart for the doctor to read. She currently reports that she has visual and auditory hallucinations and reports that she will need medication tonight to help with this. Support given and safety maintained on unit with 15 min checks.

## 2017-04-23 NOTE — [facility] Group Notes (Signed)
[place] Group Notes:  (Nursing/MHT/Case Management/Adjunct)  Date:  04/23/2017  Time:  9:34 PM  Type of Therapy:  Nurse Education  /  The group focuses on teaching patients how to identify their needs and then how to develop the skills needed to get those needs met.  Participation Level:  Active  Participation Quality:  Attentive  Affect:  Depressed  Cognitive:  Oriented  Insight:  Appropriate  Engagement in Group:  Engaged  Modes of Intervention:  Education  Summary of Progress/Problems:  Misty [name] 04/23/2017, 9:34 PM

## 2017-04-23 NOTE — Progress Notes (Signed)
[facility] MD Progress Note  04/23/2017 6:45 PM Misty [name]  MRN:  [id number] Subjective:   22 y.o Caucasian female, single, lives with her family. Background history of Borderline Personality Disorder,  early life trauma and mood disorder.  Presented to the ER voluntarily. Presented with thoughts of suicide. Reports command auditory hallucination to end her life. Says she has been hearing the voice for many years. Main stressor is conflict with her family. She feels they are not supportive enough. Reports that her father is in coma. She is linked to ACTT but has been getting psychotropic medications also from his PCP. She has been pruned from a lot of her psychoactive agents during this hospital stay.  Chart reviewed today. Patient discussed at team today.  Staff reports that she has been slightly withdrawn today. She has started expressing passive suicidal thoughts again. She has been adherent with care. No reports of hallucinations. She has not been observed to be internally distracted.  Seen today. Patient is concerned about possibility of discharge. Tells me that thoughts of being better off dead is coming back. Says she is ruminating a lot more on things that has happened in the past. No active suicidal thoughts. No abnormal perception. No derealization or depersonalization. We explored the chronicity of this issues. Therapy would be most effective in addressing her difficulties. We have agreed to continue medications at current dose.     Principal Problem: Bipolar I disorder (HCC) Diagnosis:   Patient Active Problem List   Diagnosis Date Noted  . Bipolar 1 disorder (HCC) [F31.9] 04/15/2017  . Swelling of both lower extremities [M79.89] 12/14/2016  . Lithium toxicity [T56.891A] 04/05/2016  . Fatty liver [K76.0] 10/10/2015  . Iron deficiency [E61.1] 10/10/2015  . Bipolar I disorder (HCC) [F31.9] 10/10/2015  . Palpitations [R00.2] 07/21/2015  . Morbid obesity (HCC) [E66.01] 04/03/2015  .  Borderline personality disorder (HCC) [F60.3] 03/14/2015  . Allergic rhinitis [J30.9] 10/08/2014  . GERD (gastroesophageal reflux disease) [K21.9] 10/08/2014  . H/O ASTHMA [J45.909] 10/08/2014  . INTERMITTENT URTICARIA [Z87.2] 10/08/2014  . Posttraumatic stress disorder [F43.10] 09/04/2014  . Sinus tachycardia [R00.0] 08/07/2014  . Hypertension [I10] 05/16/2013  . Metabolic syndrome [E88.81] 09/04/2012  . GAD (generalized anxiety disorder) [F41.1] 07/19/2012  . ODD (oppositional defiant disorder) [F91.3] 07/19/2012  . Obstructive sleep apnea [G47.33] 07/19/2012  . MDD (major depressive disorder), recurrent episode, moderate (HCC) [F33.1] 03/01/2012   Total Time spent with patient: 20 minutes  Past Psychiatric History: As in H&P  Past Medical History:  Past Medical History:  Diagnosis Date  . Allergic rhinitis   . Anxiety   . Depression   . GERD (gastroesophageal reflux disease)   . HTN (hypertension) 07/13/12  . IBS (irritable bowel syndrome)   . Obesity   . OSA on CPAP   . Psoriasis     Past Surgical History:  Procedure Laterality Date  . ADENOIDECTOMY    . SINOSCOPY    . TONSILLECTOMY AND ADENOIDECTOMY  Age 64   Deviated septum corrected at same time   Family History:  Family History  Problem Relation Age of Onset  . Depression Mother   . Allergic rhinitis Mother   . Depression Maternal Grandmother   . Diabetes Maternal Grandmother   . Hypertension Maternal Grandmother   . Bipolar disorder Father   . Drug abuse Father   . Hypertension Father    Family Psychiatric  History: As in H&P Social History:  Social History   Substance and Sexual Activity  Alcohol Use  No     Social History   Substance and Sexual Activity  Drug Use No    Social History   Socioeconomic History  . Marital status: Single    Spouse name: None  . Number of children: None  . Years of education: None  . Highest education level: None  Social Needs  . Financial resource strain:  None  . Food insecurity - worry: None  . Food insecurity - inability: None  . Transportation needs - medical: None  . Transportation needs - non-medical: None  Occupational History  . Occupation: STUDENT    Employer: [employer]    Comment: 11th grade [employer]  Tobacco Use  . Smoking status: Never Smoker  . Smokeless tobacco: Never Used  Substance and Sexual Activity  . Alcohol use: No  . Drug use: No  . Sexual activity: No  Other Topics Concern  . None  Social History Narrative  . None   Additional Social History:    Pain Medications: see MAR Prescriptions: see MAR Over the Counter: see MAR History of alcohol / drug use?: No history of alcohol / drug abuse                    Sleep: Good  Appetite:  Good  Current Medications: Current Facility-Administered Medications  Medication Dose Route Frequency Provider Last Rate Last Dose  . acetaminophen (TYLENOL) tablet 650 mg  650 mg Oral Q6H PRN Laveda Abbe, NP   650 mg at 04/23/17 1022  . albuterol (PROVENTIL HFA;VENTOLIN HFA) 108 (90 Base) MCG/ACT inhaler 2 puff  2 puff Inhalation Q4H PRN Laveda Abbe, NP   2 puff at 04/19/17 [phone]  . alum & mag hydroxide-simeth (MAALOX/MYLANTA) 200-200-20 MG/5ML suspension 30 mL  30 mL Oral Q4H PRN Laveda Abbe, NP      . buPROPion (WELLBUTRIN XL) 24 hr tablet 300 mg  300 mg Oral Daily Money, Gerlene Burdock, FNP   300 mg at 04/23/17 8413  . carvedilol (COREG) tablet 25 mg  25 mg Oral Daily Cobos, Rockey Situ, MD   25 mg at 04/23/17 0803  . divalproex (DEPAKOTE ER) 24 hr tablet 250 mg  250 mg Oral Daily Money, Gerlene Burdock, FNP   250 mg at 04/23/17 2440  . haloperidol (HALDOL) tablet 5 mg  5 mg Oral Q6H PRN Kerry Hough, PA-C   5 mg at 04/22/17 2152  . hydrOXYzine (ATARAX/VISTARIL) tablet 25 mg  25 mg Oral TID PRN Laveda Abbe, NP   25 mg at 04/23/17 1022  . levothyroxine (SYNTHROID, LEVOTHROID) tablet 25 mcg  25 mcg Oral QAC breakfast Laveda Abbe,  NP   25 mcg at 04/23/17 1027  . loperamide (IMODIUM) capsule 2 mg  2 mg Oral PRN Nira Conn A, NP   2 mg at 04/23/17 1620  . loratadine (CLARITIN) tablet 10 mg  10 mg Oral Daily Laveda Abbe, NP   10 mg at 04/23/17 0804  . magnesium hydroxide (MILK OF MAGNESIA) suspension 30 mL  30 mL Oral Daily PRN Laveda Abbe, NP      . montelukast (SINGULAIR) tablet 10 mg  10 mg Oral QHS Laveda Abbe, NP   10 mg at 04/22/17 2151  . [START ON 05/05/2017] paliperidone (INVEGA SUSTENNA) injection 234 mg  234 mg Intramuscular Once Money, Gerlene Burdock, FNP      . pantoprazole (PROTONIX) EC tablet 40 mg  40 mg Oral BID Laveda Abbe, NP  40 mg at 04/23/17 1620  . prazosin (MINIPRESS) capsule 2 mg  2 mg Oral QHS Laveda Abbe, NP   2 mg at 04/22/17 2151  . traZODone (DESYREL) tablet 100 mg  100 mg Oral QHS PRN Money, Gerlene Burdock, FNP   100 mg at 04/22/17 2151    Lab Results: No results found for this or any previous visit (from the past 48 hour(s)).  Blood Alcohol level:  Lab Results  Component Value Date   [facility]  06/24/2008    <5        LOWEST DETECTABLE LIMIT FOR SERUM ALCOHOL IS 5 mg/dL FOR MEDICAL PURPOSES ONLY    Metabolic Disorder Labs: Lab Results  Component Value Date   HGBA1C 5.1 04/17/2017   MPG 99.67 04/17/2017   MPG 100 02/25/2008   No results found for: PROLACTIN Lab Results  Component Value Date   CHOL 155 04/17/2017   TRIG 135 04/17/2017   HDL 46 04/17/2017   CHOLHDL 3.4 04/17/2017   VLDL 27 04/17/2017   LDLCALC 82 04/17/2017   LDLCALC 93 03/01/2012    Physical Findings: AIMS: Facial and Oral Movements Muscles of Facial Expression: None, normal Lips and Perioral Area: None, normal Jaw: None, normal Tongue: None, normal,Extremity Movements Upper (arms, wrists, hands, fingers): None, normal Lower (legs, knees, ankles, toes): None, normal, Trunk Movements Neck, shoulders, hips: None, normal, Overall Severity Severity of abnormal movements  (highest score from questions above): None, normal Incapacitation due to abnormal movements: None, normal Patient's awareness of abnormal movements (rate only patient's report): No Awareness, Dental Status Current problems with teeth and/or dentures?: No Does patient usually wear dentures?: No  CIWA:  CIWA-Ar Total: 1 COWS:  COWS Total Score: 1  Musculoskeletal: Strength & Muscle Tone: within normal limits Gait & Station: normal Patient leans: N/A  Psychiatric Specialty Exam: Physical Exam  Constitutional: She is oriented to person, place, and time. No distress.  HENT:  Head: Normocephalic and atraumatic.  Respiratory: Effort normal.  Neurological: She is alert and oriented to person, place, and time.  Skin: She is not diaphoretic.  Psychiatric:  As above     ROS  Blood pressure 123/64, pulse 80, temperature (!) 97.4 F (36.3 C), temperature source Oral, resp. rate 20, height 5\' 3"  (1.6 m), weight (!) 201.9 kg (445 lb).Body mass index is 78.83 kg/m.  General Appearance: Obese, not in any distress.   Eye Contact:  Good  Speech:  Decreased rate and tone  Volume:  Soft spoken  Mood:  overwhelmed by possibility of being discharged soon.  Affect:  Blunted and mood congruent  Thought Process:  Linear  Orientation:  Full (Time, Place, and Person)  Thought Content:  Rumination  Suicidal Thoughts:  None currently  Homicidal Thoughts:  No  Memory:  Immediate;   Good Recent;   Good Remote;   Good  Judgement:  Fair  Insight:  Fair  Psychomotor Activity:  Decreased  Concentration:  Concentration: Good and Attention Span: Good  Recall:  Good  Fund of Knowledge:  Fair  Language:  Good  Akathisia:  Negative  Handed:    AIMS (if indicated):     Assets:  Financial Resources/Insurance  ADL's:  Intact  Cognition:  WNL  Sleep:  Number of Hours: 6.25     Treatment Plan Summary: Patient seems to have regressed because discharge was explored yesterday. Passive death wish without  any intent. No micro-psychosis or dissociation. Encouraged about therapy. We would hopefully dischage her early next week.  Psychiatric: Borderline Personality Disorder Intellectual Disability Bipolar disorder unspecified.   Medical: Obesity Fatty liver Asthma HTN  Psychosocial:  Family dynamics   PLAN: 1. Continue current regimen 2. Continue to monitor mood, behavior and interaction with peers   Georgiann CockerVincent A Bryelle Spiewak, MD 04/23/2017, 6:45 PMPatient ID: Misty ForeKristal [name], female   DOB: 18-Mar-1995, 21 y.o.   MRN: [id number]

## 2017-04-24 NOTE — Progress Notes (Addendum)
Patient stated she has 6 or 7 small bumps on her abdomen.  This is not unusual for her.  Vincent Peyeriffany Lowder, PA of [facility] orders antibiotic for her.  Patient has old healed scars on her abdomen.  Patient first noticed bumps yesterday.  Clothes rubbing against these areas make her feel uncomfortable.  Patient refused cool pack.  Patient stated "I'll be OK, don't need anything now."

## 2017-04-24 NOTE — [facility] Group Notes (Signed)
[facility]  Date/Time:  04/24/2017 10:00-11:00AM  Type of Therapy and Topic:  Group Therapy:  Healthy and Unhealthy Supports  Participation Level:  Active   Description of Group:  Patients in this group were introduced to the idea of adding a variety of healthy supports to address the various needs in their lives.Patients discussed what additional healthy supports could be helpful in their recovery and wellness after discharge in order to prevent future hospitalizations.   An emphasis was placed on using counselor, doctor, therapy groups, 12-step groups, and problem-specific support groups to expand supports.  They also worked as a group on developing a specific plan for several patients to deal with unhealthy supports through boundary-setting, psychoeducation with loved ones, and even termination of relationships.   Therapeutic Goals:   1)  discuss importance of adding supports to stay well once out of the hospital  2)  compare healthy versus unhealthy supports and identify some examples of each  3)  generate ideas and descriptions of healthy supports that can be added  4)  offer mutual support about how to address unhealthy supports  5)  encourage active participation in and adherence to discharge plan    Summary of Patient Progress:  The patient expressed a willingness to add "someone to talk to that I can trust" to help in her recovery journey.  She does not feel her current therapist listens to her and was encouraged to talk to the person about this, and ask for a referral elsewhere if needed.   Therapeutic Modalities:   Motivational Interviewing Brief Solution-Focused Therapy  Ambrose MantleMareida Grossman-Orr, LCSW

## 2017-04-24 NOTE — Progress Notes (Signed)
Adult Psychoeducational Group Note  Date:  04/24/2017 Time:  8:57 PM  Group Topic/Focus:  Wrap-Up Group:   The focus of this group is to help patients review their daily goal of treatment and discuss progress on daily workbooks.  Participation Level:  Active  Participation Quality:  Appropriate  Affect:  Appropriate  Cognitive:  Alert and Oriented  Insight: Good  Engagement in Group:  Engaged  Modes of Intervention:  Discussion  Additional Comments:  Pt stated her day was hard because of her up and down moods. Pt rated her day a 6/10 and stated her goal was to decrease her depression, which she said she worked on. Pt is trying to figure out her support system when she is discharged and would like to speak with the doctor about her meds.  Leo GrosserMegan A Yavonne Kiss 04/24/2017, 8:57 PM

## 2017-04-24 NOTE — [facility] Group Notes (Signed)
[facility] Group Notes:  (Nursing/MHT/Case Management/Adjunct)  Date:  04/24/2017  Time:  4:00 pm  Type of Therapy:  Group Therapy  Participation Level:  Active  Participation Quality:  Appropriate  Affect:  Appropriate  Cognitive:  Appropriate  Insight:  Appropriate  Engagement in Group:  Engaged  Modes of Intervention:  Education  Summary of Progress/Problems:  Patient alert and interacted appropriately with group members.    Earline MayotteKnight, Treyvin Glidden Shephard 04/24/2017, 5:23 PM

## 2017-04-24 NOTE — [facility] Group Notes (Signed)
[facility] Group Notes:  (Nursing/MHT/Case Management/Adjunct)  Date:  04/24/2017  Time:  4:00 pm  Type of Therapy:  Group Therapy  Participation Level:  Active  Participation Quality:  Appropriate  Affect:  Appropriate  Cognitive:  Appropriate  Insight:  Appropriate  Engagement in Group:  Engaged  Modes of Intervention:  Education  Summary of Progress/Problems:  Patient alert and interacted appropriately with group members.   Earline MayotteKnight, Tiyah Zelenak Shephard 04/24/2017, 5:04 PM

## 2017-04-24 NOTE — Progress Notes (Signed)
[facility] MD Progress Note  04/24/2017 1:26 PM Misty [name]  MRN:  [id number] Subjective:   21 y.o Caucasian female, single, lives with her family. Background history of Borderline Personality Disorder,  early life trauma and mood disorder.  Presented to the ER voluntarily. Presented with thoughts of suicide. Reports command auditory hallucination to end her life. Says she has been hearing the voice for many years. Main stressor is conflict with her family. She feels they are not supportive enough. Reports that her father is in coma. She is linked to ACTT but has been getting psychotropic medications also from his PCP. She has been pruned from a lot of her psychoactive agents during this hospital stay.  Chart reviewed today. Patient discussed at team today.  Staff reports that she has decompensated since we started exploring discharge. She now reports auditory hallucinations. She reports thoughts of wanting to scratch her wrist. She has not acted so far. She has not been observed to be internally stimulated. She slept well last night. She has been taking her medications as prescribed.  Seen today. Just had PRN Haldol and vistaril. Reports hearing a voice in her mind. Tells her to scratch herself. Says it is a familiar voice. No intent to act here. No other form of hallucination. No persecution or any other form of delusion. Feels she is not ready for discharge. Encouraged.  Principal Problem: Bipolar I disorder (HCC) Diagnosis:   Patient Active Problem List   Diagnosis Date Noted  . Bipolar 1 disorder (HCC) [F31.9] 04/15/2017  . Swelling of both lower extremities [M79.89] 12/14/2016  . Lithium toxicity [T56.891A] 04/05/2016  . Fatty liver [K76.0] 10/10/2015  . Iron deficiency [E61.1] 10/10/2015  . Bipolar I disorder (HCC) [F31.9] 10/10/2015  . Palpitations [R00.2] 07/21/2015  . Morbid obesity (HCC) [E66.01] 04/03/2015  . Borderline personality disorder (HCC) [F60.3] 03/14/2015  . Allergic rhinitis  [J30.9] 10/08/2014  . GERD (gastroesophageal reflux disease) [K21.9] 10/08/2014  . H/O ASTHMA [J45.909] 10/08/2014  . INTERMITTENT URTICARIA [Z87.2] 10/08/2014  . Posttraumatic stress disorder [F43.10] 09/04/2014  . Sinus tachycardia [R00.0] 08/07/2014  . Hypertension [I10] 05/16/2013  . Metabolic syndrome [E88.81] 09/04/2012  . GAD (generalized anxiety disorder) [F41.1] 07/19/2012  . ODD (oppositional defiant disorder) [F91.3] 07/19/2012  . Obstructive sleep apnea [G47.33] 07/19/2012  . MDD (major depressive disorder), recurrent episode, moderate (HCC) [F33.1] 03/01/2012   Total Time spent with patient: 20 minutes  Past Psychiatric History: As in H&P  Past Medical History:  Past Medical History:  Diagnosis Date  . Allergic rhinitis   . Anxiety   . Depression   . GERD (gastroesophageal reflux disease)   . HTN (hypertension) 07/13/12  . IBS (irritable bowel syndrome)   . Obesity   . OSA on CPAP   . Psoriasis     Past Surgical History:  Procedure Laterality Date  . ADENOIDECTOMY    . SINOSCOPY    . TONSILLECTOMY AND ADENOIDECTOMY  Age 23   Deviated septum corrected at same time   Family History:  Family History  Problem Relation Age of Onset  . Depression Mother   . Allergic rhinitis Mother   . Depression Maternal Grandmother   . Diabetes Maternal Grandmother   . Hypertension Maternal Grandmother   . Bipolar disorder Father   . Drug abuse Father   . Hypertension Father    Family Psychiatric  History: As in H&P Social History:  Social History   Substance and Sexual Activity  Alcohol Use No     Social  History   Substance and Sexual Activity  Drug Use No    Social History   Socioeconomic History  . Marital status: Single    Spouse name: None  . Number of children: None  . Years of education: None  . Highest education level: None  Social Needs  . Financial resource strain: None  . Food insecurity - worry: None  . Food insecurity - inability: None  .  Transportation needs - medical: None  . Transportation needs - non-medical: None  Occupational History  . Occupation: STUDENT    Employer: [employer]    Comment: 11th grade [employer]  Tobacco Use  . Smoking status: Never Smoker  . Smokeless tobacco: Never Used  Substance and Sexual Activity  . Alcohol use: No  . Drug use: No  . Sexual activity: No  Other Topics Concern  . None  Social History Narrative  . None   Additional Social History:    Pain Medications: see MAR Prescriptions: see MAR Over the Counter: see MAR History of alcohol / drug use?: No history of alcohol / drug abuse                    Sleep: Good  Appetite:  Good  Current Medications: Current Facility-Administered Medications  Medication Dose Route Frequency Provider Last Rate Last Dose  . acetaminophen (TYLENOL) tablet 650 mg  650 mg Oral Q6H PRN Laveda Abbe, NP   650 mg at 04/24/17 0415  . albuterol (PROVENTIL HFA;VENTOLIN HFA) 108 (90 Base) MCG/ACT inhaler 2 puff  2 puff Inhalation Q4H PRN Laveda Abbe, NP   2 puff at 04/19/17 [phone]  . alum & mag hydroxide-simeth (MAALOX/MYLANTA) 200-200-20 MG/5ML suspension 30 mL  30 mL Oral Q4H PRN Laveda Abbe, NP      . buPROPion (WELLBUTRIN XL) 24 hr tablet 300 mg  300 mg Oral Daily Money, Gerlene Burdock, FNP   300 mg at 04/24/17 9604  . carvedilol (COREG) tablet 25 mg  25 mg Oral Daily Cobos, Rockey Situ, MD   25 mg at 04/24/17 0733  . divalproex (DEPAKOTE ER) 24 hr tablet 250 mg  250 mg Oral Daily Money, Gerlene Burdock, FNP   250 mg at 04/24/17 5409  . haloperidol (HALDOL) tablet 5 mg  5 mg Oral Q6H PRN Kerry Hough, PA-C   5 mg at 04/23/17 2158  . hydrOXYzine (ATARAX/VISTARIL) tablet 25 mg  25 mg Oral TID PRN Laveda Abbe, NP   25 mg at 04/24/17 0735  . levothyroxine (SYNTHROID, LEVOTHROID) tablet 25 mcg  25 mcg Oral QAC breakfast Laveda Abbe, NP   25 mcg at 04/24/17 [phone]  . loperamide (IMODIUM) capsule 2 mg  2 mg Oral  PRN Nira Conn A, NP   2 mg at 04/23/17 1620  . loratadine (CLARITIN) tablet 10 mg  10 mg Oral Daily Laveda Abbe, NP   10 mg at 04/24/17 0734  . magnesium hydroxide (MILK OF MAGNESIA) suspension 30 mL  30 mL Oral Daily PRN Laveda Abbe, NP      . montelukast (SINGULAIR) tablet 10 mg  10 mg Oral QHS Laveda Abbe, NP   10 mg at 04/23/17 2158  . [START ON 05/05/2017] paliperidone (INVEGA SUSTENNA) injection 234 mg  234 mg Intramuscular Once Money, Gerlene Burdock, FNP      . pantoprazole (PROTONIX) EC tablet 40 mg  40 mg Oral BID Laveda Abbe, NP   40 mg at 04/24/17  1610  . prazosin (MINIPRESS) capsule 2 mg  2 mg Oral QHS Laveda Abbe, NP   2 mg at 04/23/17 2158  . traZODone (DESYREL) tablet 100 mg  100 mg Oral QHS PRN Money, Gerlene Burdock, FNP   100 mg at 04/23/17 2158    Lab Results: No results found for this or any previous visit (from the past 48 hour(s)).  Blood Alcohol level:  Lab Results  Component Value Date   [facility]  06/24/2008    <5        LOWEST DETECTABLE LIMIT FOR SERUM ALCOHOL IS 5 mg/dL FOR MEDICAL PURPOSES ONLY    Metabolic Disorder Labs: Lab Results  Component Value Date   HGBA1C 5.1 04/17/2017   MPG 99.67 04/17/2017   MPG 100 02/25/2008   No results found for: PROLACTIN Lab Results  Component Value Date   CHOL 155 04/17/2017   TRIG 135 04/17/2017   HDL 46 04/17/2017   CHOLHDL 3.4 04/17/2017   VLDL 27 04/17/2017   LDLCALC 82 04/17/2017   LDLCALC 93 03/01/2012    Physical Findings: AIMS: Facial and Oral Movements Muscles of Facial Expression: None, normal Lips and Perioral Area: None, normal Jaw: None, normal Tongue: None, normal,Extremity Movements Upper (arms, wrists, hands, fingers): None, normal Lower (legs, knees, ankles, toes): None, normal, Trunk Movements Neck, shoulders, hips: None, normal, Overall Severity Severity of abnormal movements (highest score from questions above): None, normal Incapacitation due to  abnormal movements: None, normal Patient's awareness of abnormal movements (rate only patient's report): No Awareness, Dental Status Current problems with teeth and/or dentures?: No Does patient usually wear dentures?: No  CIWA:  CIWA-Ar Total: 6 COWS:  COWS Total Score: 3  Musculoskeletal: Strength & Muscle Tone: within normal limits Gait & Station: normal Patient leans: N/A  Psychiatric Specialty Exam: Physical Exam  Constitutional: She is oriented to person, place, and time. No distress.  HENT:  Head: Normocephalic and atraumatic.  Respiratory: Effort normal.  Neurological: She is alert and oriented to person, place, and time.  Skin: She is not diaphoretic.  Psychiatric:  As above     ROS  Blood pressure (!) 120/57, pulse (!) 113, temperature 98.4 F (36.9 C), temperature source Oral, resp. rate 16, height 5\' 3"  (1.6 m), weight (!) 201.9 kg (445 lb).Body mass index is 78.83 kg/m.  General Appearance: Obese, does appear internally stimulated. Not in any distress.   Eye Contact:  Good  Speech:  Decreased rate and tone  Volume:  Soft spoken  Mood:  overwhelmed by possibility of being discharged soon.  Affect:  Blunted and mood congruent  Thought Process:  Linear  Orientation:  Full (Time, Place, and Person)  Thought Content:  Rumination  Suicidal Thoughts:  None currently  Homicidal Thoughts:  No  Memory:  Immediate;   Good Recent;   Good Remote;   Good  Judgement:  Fair  Insight:  Fair  Psychomotor Activity:  Decreased  Concentration:  Concentration: Good and Attention Span: Good  Recall:  Good  Fund of Knowledge:  Fair  Language:  Good  Akathisia:  Negative  Handed:    AIMS (if indicated):     Assets:  Financial Resources/Insurance  ADL's:  Intact  Cognition:  WNL  Sleep:  Number of Hours: 6.25     Treatment Plan Summary: Patient has decompensated in context of discharge plans. She has micro-psychotic episode today. We would evaluate her further. Hope  dischage later this week.    Psychiatric: Borderline Personality Disorder Intellectual  Disability Bipolar disorder unspecified.   Medical: Obesity Fatty liver Asthma HTN  Psychosocial:  Family dynamics   PLAN: 1. Continue current regimen 2. Continue to monitor mood, behavior and interaction with peers   Georgiann CockerVincent A Izediuno, MD 04/24/2017, 1:26 PMPatient ID: Misty [name], female   DOB: 1995/08/01, 21 y.o.   MRN: [id number] Patient ID: Misty [name], female   DOB: 1995/08/01, 22 y.o.   MRN: [id number]

## 2017-04-24 NOTE — Plan of Care (Signed)
Nurse discussed depression, anxiety, coping skills with patient.  

## 2017-04-24 NOTE — [facility] Group Notes (Signed)
[facility] Group Notes:  (Nursing/MHT/Case Management/Adjunct)  Date:  04/24/2017  Time:  8:30 a.m.  Type of Therapy:  Psychoeducational Skills  Participation Level:  Active  Participation Quality:  Appropriate  Affect:  Appropriate  Cognitive:  Appropriate  Insight:  Appropriate  Engagement in Group:  Engaged  Modes of Intervention:  Education  Summary of Progress/Problems:  Patient was alert and contributed input in this group time.   Quintella ReichertKnight, Ginny Loomer [facility] 04/24/2017, 10:00 AM

## 2017-04-24 NOTE — Progress Notes (Signed)
D    Pt is depressed and anxious   She reports her antidepressant doesn't work anymore and would like a new medication she did say the haldol does help with the voices she hears    She said the voices get worse in the evening but if she has something to do that occupies her mind she is much better at ignoring them A    Verbal support given    Medications administered and effectiveness monitored    Q 15 min checks   Discussed positive thinking and repeating positive mantras  R   Pt verbalized understanding and is presently safe

## 2017-04-24 NOTE — Progress Notes (Addendum)
D: Patient's self inventory sheet- patient slept fair, requested sleep medication, which she claimed to be helpful. Appetite the last 24 hours was good, energy level described as low.Concentration poor. Rated depression 10, hopelessness 10, and anxiety 10. Denies withdrawal from drugs and alcohol. Patient had thoughts of hurting herself today, thoughts occur "almost all the time." Patient contracted to safety during morning med pass around 8 am. No plan. Denies physical problems. Patient goal is to decrease depression and anxiety; will "tell the doctor how I am feeling." During morning group, patient stated she feels as if the doctors are judging her. Patient was encouraged to talk to her doctor about how she feels.  A: Medications administered per MD orders. Emotional support and encouragement given to patient. Discussed possible volunteer work at the [employer] or a [employer].  R: Denied visual hallucinations, but had auditory hallucinations telling her to scratch herself. Patient contracts for safety. SI on and off. Safety checks completed every 15 minutes per protocol.

## 2017-04-25 MED ORDER — BUPROPION HCL ER (XL) 300 MG PO TB24: 300.0000 mg | ORAL_TABLET | Freq: Every day | ORAL | 0 refills | Status: DC

## 2017-04-25 MED ORDER — PALIPERIDONE PALMITATE 234 MG/1.5ML IM SUSP: 234.0000 mg | Freq: Once | INTRAMUSCULAR | 0 refills | Status: DC

## 2017-04-25 MED ORDER — DIVALPROEX SODIUM ER 250 MG PO TB24: 250.0000 mg | ORAL_TABLET | Freq: Every day | ORAL | 0 refills | Status: DC

## 2017-04-25 NOTE — Progress Notes (Signed)
  [facility] Adult Case Management Discharge Plan :  Will you be returning to the same living situation after discharge:  Yes,  with family At discharge, do you have transportation home?: Yes,  family Do you have the ability to pay for your medications: Yes,  medicare  Release of information consent forms completed and in the chart;  Patient's signature needed at discharge.  Patient to Follow up at: Follow-up Information    Charlott Rakes, MD Follow up.   Specialty:  Family Medicine Why:  Follow up with primary care for medical needs. Contact information: [address] [place] [address] [phone]        [facility] Follow up.   Specialty:  Home Health Services Why:  Home health aide to come out and fill pill boxes. Contact information: [address] [place] [address] [phone]        Services, [facility] Follow up on 04/27/2017.   Why:  Your ACT team therapist will meet with you in your home on Wednesday, 04/27/17. Contact information: [address] [place] [address] [phone]           Next level of care provider has access to [facility] Link:no  Safety Planning and Suicide Prevention discussed: Yes,  with mother  Have you used any form of tobacco in the last 30 days? (Cigarettes, Smokeless Tobacco, Cigars, and/or Pipes): No  Has patient been referred to the Quitline?: N/A patient is not a smoker  Patient has been referred for addiction treatment: Yes  Lorri Frederick, LCSW 04/25/2017, 11:20 AM

## 2017-04-25 NOTE — Progress Notes (Signed)
Adult Psychoeducational Group Note  Date:  04/25/2017 Time:  4:15 PM  Group Topic/Focus:  Healthy Communication:   The focus of this group is to discuss communication, barriers to communication, as well as healthy ways to communicate with others.  Participation Level:  Active  Participation Quality:  Appropriate and Sharing  Affect:  Flat  Cognitive:  Alert and Appropriate  Insight: Improving  Engagement in Group:  Developing/Improving  Modes of Intervention:  Discussion and Support  Additional Comments:  Patient tossed a ball around to members of the group and answered questions. Patient identified importance of getting to know peers, themselves and build communication skills.  Marnee Sherrard A Jake Fuhrmann 04/25/2017, 4:45 PM 
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
## 2017-04-25 NOTE — Progress Notes (Signed)
Discharge Note:  Patient discharged home with family member.  Patient denied A/V hallucinations.  Patient denied SI and HI.  Suicide prevention information given and discussed with patient who stated she understood and had no questions.  Patient stated she received all her belongings, clothing, books, toiletries, misc items, prescriptions, etc.  Patient stated [facility].  All required discharge information given to patient at discharge.

## 2017-04-25 NOTE — Progress Notes (Signed)
D:  Patient's self inventory sheet, patient sleeps good, sleep medication helpful.  Good appetite, low energy level, poor concentration.  Rated depression, hopeless and anxiety 10.  Denied withdrawals.  SI, contracts for safety, plan to scratch self, stated "I will tell staff if I am going to hurt myself."  Physical problems, headaches, worst pain in past 24 hours is #10.  Goal is talk to MD abut depression medicines.  No discharge plans. A:  Medications administered per MD orders.  Emotional support and encouragement given patient. R:  Patient denied HI.  Denied visual hallucinations.  SI, contracts for safety.  Continues to hear voices to hurt self, Patient declined wellbutrin this morning, stated medication not helping her.  MD/NP/pharmcist stated medication needs to be taken for awhile before medication reaches its full potential.  Patient agreed to take wellbutrin later in the morning.

## 2017-04-25 NOTE — Progress Notes (Signed)
TC Celedonio Savageebecca Lillyrose Tamer, mother.  They came and visited pt last night.  Pt still reporting anxiety, that she is hearing things sometimes, and that she has thoughts of "not wanting to be here."  We discussed that this appears to be baseline for pt and Lurena JoinerRebecca acknowledged this.  CSW informed her that ACT team is saying they cannot come to either visit or transport pt.  Lurena JoinerRebecca said they could come pick her up but it would be later in the evening when they could get here. Garner NashGregory Joanthony Hamza, MSW, LCSW Clinical Social Worker 04/25/2017 9:09 AM

## 2017-04-25 NOTE — [facility] Group Notes (Signed)
[facility] LCSW Group Therapy Note  Date/Time: 04/25/17, 1315  Type of Therapy and Topic:  Group Therapy:  Overcoming Obstacles  Participation Level:  moderate  Description of Group:    In this group patients will be encouraged to explore what they see as obstacles to their own wellness and recovery. They will be guided to discuss their thoughts, feelings, and behaviors related to these obstacles. The group will process together ways to cope with barriers, with attention given to specific choices patients can make. Each patient will be challenged to identify changes they are motivated to make in order to overcome their obstacles. This group will be process-oriented, with patients participating in exploration of their own experiences as well as giving and receiving support and challenge from other group members.  Therapeutic Goals: 1. Patient will identify personal and current obstacles as they relate to admission. 2. Patient will identify barriers that currently interfere with their wellness or overcoming obstacles.  3. Patient will identify feelings, thought process and behaviors related to these barriers. 4. Patient will identify two changes they are willing to make to overcome these obstacles:    Summary of Patient Progress: Pt identified depression, PTSD, and lack of support as obstacles in her life.  Pt made several comments during group discussion about ways to overcome obstacles.      Therapeutic Modalities:   Cognitive Behavioral Therapy Solution Focused Therapy Motivational Interviewing Relapse Prevention Therapy  Daleen SquibbGreg Adedamola Seto, LCSW

## 2017-04-25 NOTE — Progress Notes (Signed)
Adult Psychoeducational Group Note  Date:  04/25/2017 Time:  11:21 AM  Group Topic/Focus:  Building Self Esteem:   The Focus of this group is helping patients become aware of the effects of self-esteem on their lives, the things they and others do that enhance or undermine their self-esteem, seeing the relationship between their level of self-esteem and the choices they make and learning ways to enhance self-esteem.  Participation Level:  Active  Participation Quality:  Appropriate  Affect:  Appropriate  Cognitive:  Alert  Insight: Appropriate, Good and Improving  Engagement in Group:  Engaged  Modes of Intervention:  Activity and Discussion  Additional Comments:  Pt attended group and participated in activity and discussion.  Pt states that she is still feeling very depressed and feels like people would be better off without her.  Pt states that she doesn't think the medicine is working for her which I reported to her nurse.  Randeep Biondolillo R Shyheem Whitham 04/25/2017, 11:21 AM

## 2017-04-25 NOTE — Progress Notes (Signed)
Recreation Therapy Notes  Date: 3.18.19 Time: 9:30 a.m. Location: [address] Dayroom  Group Topic: Stress Management  Goal Area(s) Addresses:  Goal 1.1: To reduce stress  -Patient will report feeling a reduction in stress level  -Patient will identify the importance of stress management  -Patient will participate during stress management group treatment    Behavioral Response: Engaged  Intervention: Stress Management  Activity: Meditattion- Patients were in a peaceful environment with soft lighting enhancing patients mood.   Education: Stress Management, Discharge Planning.   Education Outcome: Acknowledges edcuation/In group clarification offered/Needs additional education  Clinical Observations/Feedback:: Patient attended and participated appropriately during stress management group treatment. Patient reported feeling a reduction in stress level.   [name], Recreation Therapy Intern   Misty [name] 04/25/2017 12:38 PM 

## 2017-04-25 NOTE — [facility] Suicide Risk Assessment (Signed)
[facility]   Principal Problem: Bipolar I disorder [name](HCC) Discharge Diagnoses: Borderline Personality Disorder Patient Active Problem List   Diagnosis Date Noted  . Bipolar 1 disorder (HCC) [F31.9] 04/15/2017  . Swelling of both lower extremities [M79.89] 12/14/2016  . Lithium toxicity [T56.891A] 04/05/2016  . Fatty liver [K76.0] 10/10/2015  . Iron deficiency [E61.1] 10/10/2015  . Bipolar I disorder (HCC) [F31.9] 10/10/2015  . Palpitations [R00.2] 07/21/2015  . Morbid obesity (HCC) [E66.01] 04/03/2015  . Borderline personality disorder (HCC) [F60.3] 03/14/2015  . Allergic rhinitis [J30.9] 10/08/2014  . GERD (gastroesophageal reflux disease) [K21.9] 10/08/2014  . H/O ASTHMA [J45.909] 10/08/2014  . INTERMITTENT URTICARIA [Z87.2] 10/08/2014  . Posttraumatic stress disorder [F43.10] 09/04/2014  . Sinus tachycardia [R00.0] 08/07/2014  . Hypertension [I10] 05/16/2013  . Metabolic syndrome [E88.81] 09/04/2012  . GAD (generalized anxiety disorder) [F41.1] 07/19/2012  . ODD (oppositional defiant disorder) [F91.3] 07/19/2012  . Obstructive sleep apnea [G47.33] 07/19/2012  . MDD (major depressive disorder), recurrent episode, moderate (HCC) [F33.1] 03/01/2012    Total Time spent with patient: 45 minutes  Musculoskeletal: Strength & Muscle Tone: within normal limits Gait & Station: normal Patient leans: N/A  Psychiatric Specialty Exam: Review of Systems  Constitutional: Negative.   HENT: Negative.   Eyes: Negative.   Respiratory: Negative.   Cardiovascular: Negative.   Gastrointestinal: Negative.   Genitourinary: Negative.   Musculoskeletal: Negative.   Skin: Negative.   Neurological: Negative.   Endo/Heme/Allergies: Negative.   Psychiatric/Behavioral: Negative for depression, hallucinations, memory loss, substance abuse and suicidal ideas. The patient is not nervous/anxious and does not have insomnia.     Blood pressure 119/61, pulse 99, temperature  97.6 F (36.4 C), temperature source Oral, resp. rate 20, height 5\' 3"  (1.6 m), weight (!) 201.9 kg (445 lb).Body mass index is 78.83 kg/m.  General Appearance: Neatly dressed, pleasant, engaging well and cooperative. Appropriate behavior. Not in any distress. Good relatedness. Not internally stimulated  Eye Contact::  Good  Speech:  Spontaneous, normal prosody. Normal tone and rate.   Volume:  Normal  Mood:  Euthymic  Affect: Mobilizing some positive affect.   Thought Process:  Goal Directed and Linear  Orientation:  Full (Time, Place, and Person)  Thought Content: No delusional theme. No preoccupation with violent thoughts. No negative ruminations. No obsession.  No hallucination in any modality.   Suicidal Thoughts:  None currently  Homicidal Thoughts:  No  Memory:  Immediate;   Good Recent;   Good Remote;   Good  Judgement:  Good  Insight:  Fair  Psychomotor Activity:  Normal  Concentration:  Good  Recall:  Good  Fund of Knowledge:Good  Language: Good  Akathisia:  Negative  Handed:    AIMS (if indicated):     Assets:  Communication Skills Housing Resilience  Sleep:  Number of Hours: 6.5  Cognition: WNL  ADL's:  Intact   Clinical Assessment::   22 y.o Caucasian female, single, lives with her family. Background history of Borderline Personality Disorder,  early life trauma and mood disorder.  Presented to the ER voluntarily. Presented with thoughts of suicide. Reports command auditory hallucination to end her life. Says she has been hearing the voice for many years. Main stressor is conflict with her family. She feels they are not supportive enough. Reports that her father is in coma. She is linked to ACTT but has been getting psychotropic medications also from his PCP. She has been pruned from a lot of her psychoactive agents during this hospital  stay.  Discussed at team today. Her family came to visit yesterday. Her mother reports that she is at baseline. Described her as  very manipulative. Says typically she would use hallucinations and suicidal thoughts to manipulate others. Family plans to take her home today. No concerns about her safety or the safety of others. Staff notes that she does not appear distracted by internal stimuli. She has been adhering to her regimen. She has not voiced any active thoughts of suicide.   Seen today. Says her family visited yesterday. Says it went well. She is feeling much better today.  No hallucination in any modality today. No other form of perceptual abnormality. No feelings of things being unreal. No feeling of impending doom. Not expressing any delusional statement. No suicidal thoughts. No homicidal thoughts. No thoughts of violence. Not expressing any new stressors at home. She hopes we have enough medications for her until her next doctors visit.  No overwhelming anxiety. No access to weapons.     Demographic Factors:  Caucasian  Loss Factors: NA  Historical Factors: Prior suicide attempts and Impulsivity  Risk Reduction Factors:   Sense of responsibility to family, Living with another person, especially a relative, Positive social support, Positive therapeutic relationship and Positive coping skills or problem solving skills  Continued Clinical Symptoms:  As above   Cognitive Features That Contribute To Risk:  None    Suicide Risk:  Minimal: No identifiable suicidal ideation.  Patient is not having any thoughts of suicide at this time. Modifiable risk factors targeted during this admission includes depression and Borderline Personality Disorder. Demographical and historical risk factors cannot be modified. Patient is now engaging well. Patient is reliable and is future oriented. We have buffered patient's support structures. At this point, patient is at low risk of suicide. Patient is aware of the effects of psychoactive substances on decision making process. Patient has been provided with emergency contacts.  Patient acknowledges to use resources provided if unforseen circumstances changes their current risk stratification.    Follow-up Information    Charlott Rakes, MD Follow up.   Specialty:  Family Medicine Why:  Follow up with primary care for medical needs. Contact information: [address] [place] [address] [phone]        [facility] Follow up.   Specialty:  Home Health Services Why:  Home health aide to come out and fill pill boxes. Contact information: [address] [place] [address] [phone]        Services, [facility] Follow up.   Why:  Follow up with Assertive Community Treatment Team at discharge. Contact information: [address] [place] [address] [phone]           Plan Of Care/Follow-up recommendations:  1. Continue current psychotropic medications 2. Mental health and addiction follow up as arranged.  3. Discharge in care of her family 4. Provided limited quantity of prescriptions   Georgiann Cocker, MD 04/25/2017, 10:39 AM

## 2017-04-26 DIAGNOSIS — I10 Essential (primary) hypertension: Secondary | ICD-10-CM | POA: Diagnosis not present

## 2017-04-27 ENCOUNTER — Other Ambulatory Visit: Payer: Self-pay | Admitting: Allergy and Immunology

## 2017-05-09 ENCOUNTER — Ambulatory Visit: Payer: Self-pay | Admitting: Cardiology

## 2017-05-11 ENCOUNTER — Ambulatory Visit: Payer: Self-pay

## 2017-05-11 ENCOUNTER — Other Ambulatory Visit: Payer: Self-pay | Admitting: Allergy and Immunology

## 2017-05-12 ENCOUNTER — Ambulatory Visit: Payer: Medicaid Other | Admitting: Allergy and Immunology

## 2017-05-16 ENCOUNTER — Ambulatory Visit: Payer: Medicaid Other | Admitting: Allergy and Immunology

## 2017-05-26 ENCOUNTER — Other Ambulatory Visit: Payer: Self-pay | Admitting: Cardiology

## 2017-05-26 ENCOUNTER — Ambulatory Visit (INDEPENDENT_AMBULATORY_CARE_PROVIDER_SITE_OTHER): Payer: Medicaid Other | Admitting: Allergy and Immunology

## 2017-05-26 ENCOUNTER — Encounter: Payer: Self-pay | Admitting: Allergy and Immunology

## 2017-05-26 VITALS — BP 150/88 | HR 80 | Resp 20

## 2017-05-26 DIAGNOSIS — Z7722 Contact with and (suspected) exposure to environmental tobacco smoke (acute) (chronic): Secondary | ICD-10-CM

## 2017-05-26 DIAGNOSIS — J4550 Severe persistent asthma, uncomplicated: Secondary | ICD-10-CM | POA: Diagnosis not present

## 2017-05-26 DIAGNOSIS — J3089 Other allergic rhinitis: Secondary | ICD-10-CM | POA: Diagnosis not present

## 2017-05-26 DIAGNOSIS — K219 Gastro-esophageal reflux disease without esophagitis: Secondary | ICD-10-CM | POA: Diagnosis not present

## 2017-05-26 MED ORDER — MEPOLIZUMAB 100 MG ~~LOC~~ SOLR
100.0000 mg | SUBCUTANEOUS | Status: DC
  Administered 2017-05-26: 100 mg via SUBCUTANEOUS

## 2017-05-26 MED ORDER — AZELASTINE HCL 0.05 % OP SOLN: 1.0000 [drp] | Freq: Two times a day (BID) | OPHTHALMIC | 5 refills | Status: DC

## 2017-05-26 MED ORDER — FEXOFENADINE HCL 180 MG PO TABS: 180.0000 mg | ORAL_TABLET | Freq: Every day | ORAL | 5 refills | Status: DC

## 2017-05-26 MED ORDER — OLOPATADINE HCL 0.1 % OP SOLN: 1.0000 [drp] | Freq: Two times a day (BID) | OPHTHALMIC | 5 refills | Status: DC

## 2017-05-26 NOTE — Progress Notes (Signed)
Follow-up Note  Referring Provider: Nancie NeasLowder, Tiffany N, PA Primary Provider: Nancie NeasLowder, Tiffany N, PA Date of Office Visit: 05/26/2017  Subjective:   Misty [name] (DOB: 1996-01-22) is a 22 y.o. female who [facility] in re-evaluation of the following:  HPI: Misty NissenKristal returns to this clinic in reevaluation of her multiorgan atopic disease manifested as asthma and allergic rhinitis and a history of reflux and urticaria.  Her last visit to this clinic was 15 November 2016.  She has had excellent control of her asthma and no longer uses any inhalers.  She is now using NUCALA injections.  It does not sound as though she has required a systemic steroid to treat an exacerbation.  There is no longer any tobacco smoke exposure inside the house or car.  Her nose is okay although she has had some sneezing during the spring.  She will not use a nasal steroid because it gives rise to epistaxis.  She does use Allegra.  She has not required a antibiotic to treat an episode of sinusitis.  Her reflux is doing relatively good at this point while using a combination of pantoprazole twice a day and ranitidine in the evening.  She did obtain a flu vaccine this year.  Allergies as of 05/26/2017      Reactions   Ultram [tramadol] Rash   Pt reports itching as well   Adhesive [tape] Rash      Medication List      albuterol 108 (90 Base) MCG/ACT inhaler Commonly known as:  PROVENTIL HFA Inhale two puffs every four to six hours as needed for cough or wheeze.   alosetron 0.5 MG tablet Commonly known as:  LOTRONEX Take 0.5 mg by mouth 2 (two) times daily.   buPROPion 300 MG 24 hr tablet Commonly known as:  WELLBUTRIN XL Take 1 tablet (300 mg total) by mouth daily. For mood control   carvedilol 25 MG tablet Commonly known as:  COREG TAKE ONE TABLET BY MOUTH TWICE DAILY WITH meals   dicyclomine 10 MG capsule Commonly known as:  BENTYL Take 1 capsule by mouth  4 (four) times daily.   divalproex 500 MG 24 hr tablet Commonly known as:  DEPAKOTE ER Take 1 tablet (500 mg total) by mouth 2 (two) times daily.   divalproex 250 MG 24 hr tablet Commonly known as:  DEPAKOTE ER Take 1 tablet (250 mg total) by mouth daily. For mood control   EPINEPHrine 0.3 mg/0.3 mL Soaj injection Commonly known as:  EPI-PEN Use as directed for life threatening allergic reaction   ferrous sulfate 325 (65 FE) MG tablet See admin instructions.   haloperidol 5 MG tablet Commonly known as:  HALDOL Take 5 mg by mouth 3 (three) times daily.   hydrOXYzine 25 MG tablet Commonly known as:  ATARAX/VISTARIL Take 1 tablet (25 mg total) by mouth 3 (three) times daily as needed for anxiety.   ipratropium-albuterol 0.5-2.5 (3) MG/3ML Soln Commonly known as:  DUONEB Take 3 mLs by nebulization every 6 (six) hours as needed.   levothyroxine 25 MCG tablet Commonly known as:  SYNTHROID, LEVOTHROID Take 1 tablet (25 mcg total) by mouth daily before breakfast.   loratadine 10 MG tablet Commonly known as:  CLARITIN Take 1 tablet (10 mg total) by mouth daily.   montelukast 10 MG tablet Commonly known as:  SINGULAIR Take 10 mg by mouth at bedtime.   Olopatadine HCl 0.2 % Soln Commonly known as:  PATADAY Place 1 drop into both eyes 1 day or 1 dose.   paliperidone 234 MG/1.5ML Susp injection Commonly known as:  INVEGA SUSTENNA Inject 234 mg into the muscle once for 1 dose. Next injection due 05-05-17   potassium chloride 10 MEQ tablet Commonly known as:  K-DUR,KLOR-CON Take 1 tablet (10 mEq total) by mouth daily.   prazosin 2 MG capsule Commonly known as:  MINIPRESS Take 1 capsule (2 mg total) by mouth at bedtime.   ranitidine 300 MG tablet Commonly known as:  ZANTAC TAKE ONE TABLET BY MOUTH AT BEDTIME   traZODone 50 MG tablet Commonly known as:  DESYREL Take 1 tablet (50 mg total) by mouth at bedtime as needed for sleep.       Past Medical History:    Diagnosis Date  . Allergic rhinitis   . Anxiety   . Depression   . GERD (gastroesophageal reflux disease)   . HTN (hypertension) 07/13/12  . IBS (irritable bowel syndrome)   . Obesity   . OSA on CPAP   . Psoriasis     Past Surgical History:  Procedure Laterality Date  . ADENOIDECTOMY    . SINOSCOPY    . TONSILLECTOMY AND ADENOIDECTOMY  Age 51   Deviated septum corrected at same time    Review of systems negative except as noted in HPI / PMHx or noted below:  Review of Systems  Constitutional: Negative.   HENT: Negative.   Eyes: Negative.   Respiratory: Negative.   Cardiovascular: Negative.   Gastrointestinal: Negative.   Genitourinary: Negative.   Musculoskeletal: Negative.   Skin: Negative.   Neurological: Negative.   Endo/Heme/Allergies: Negative.   Psychiatric/Behavioral: Negative.      Objective:   Vitals:   05/26/17 1619  BP: (!) 150/88  Pulse: 80  Resp: 20          Physical Exam  HENT:  Head: Normocephalic.  Right Ear: Tympanic membrane, external ear and ear canal normal.  Left Ear: Tympanic membrane, external ear and ear canal normal.  Nose: Nose normal. No mucosal edema or rhinorrhea.  Mouth/Throat: Uvula is midline, oropharynx is clear and moist and mucous membranes are normal. No oropharyngeal exudate.  Eyes: Conjunctivae are normal.  Neck: Trachea normal. No tracheal tenderness present. No tracheal deviation present. No thyromegaly present.  Cardiovascular: Normal rate, regular rhythm, S1 normal, S2 normal and normal heart sounds.  No murmur heard. Pulmonary/Chest: Breath sounds normal. No stridor. No respiratory distress. She has no wheezes. She has no rales.  Musculoskeletal: She exhibits no edema.  Lymphadenopathy:       Head (right side): No tonsillar adenopathy present.       Head (left side): No tonsillar adenopathy present.    She has no cervical adenopathy.  Neurological: She is alert.  Skin: No rash noted. She is not  diaphoretic. No erythema. Nails show no clubbing.    Diagnostics:    Spirometry was performed and demonstrated an FEV1 of 2.42 at 73 % of predicted.  The patient had an Asthma Control Test with the following results: ACT Total Score: 17.    Assessment and Plan:   1. Severe persistent asthma without complication   2. Other allergic rhinitis   3. Gastroesophageal reflux disease, esophagitis presence not specified   4. Secondhand smoke exposure     1. Continue Nucala injections   2. "Action Plan" for asthma flare up: FLOVENT 110 - 3 inhalations 3 times per day   3. Continue pantoprazole 40 mg  twice a day and Ranitidine 300mg  one time per day in the evening  4. Continue Allegra 180 one tablet one time per day.    5. If needed:   A. nasal saline spray multiple times a day  B. Azelastine 1-2 sprays each nostril one-2 times a day  C. Proventil HFA 2 puffs or albuterol nebulization every 4-6 hours   6. No tobacco smoke exposure inside the house or car.  7. Return to clinic in 6 months or earlier if problem  Skylen has really done so much better since she started a course of anti-IL 5 biological agent and she has been able to come off most of her anti-inflammatory agents for her respiratory tract with this treatment.  She will continue to utilize this agent as well as therapy directed against reflux and I will see her back in this clinic in 6 months or earlier if there is a problem.  Laurette Schimke, MD Allergy / Immunology [facility]

## 2017-05-26 NOTE — Patient Instructions (Addendum)
  1. Continue Nucala injections   2. "Action Plan" for asthma flare up: FLOVENT 110 - 3 inhalations 3 times per day   3. Continue pantoprazole 40 mg twice a day and Ranitidine 300mg  one time per day in the evening  4. Continue Allegra 180 one tablet one time per day.    5. If needed:   A. nasal saline spray multiple times a day  B. Azelastine 1-2 sprays each nostril one-2 times a day  C. Proventil HFA 2 puffs or albuterol nebulization every 4-6 hours   6. No tobacco smoke exposure inside the house or car.  7. Return to clinic in 6 months or earlier if problem

## 2017-05-30 ENCOUNTER — Encounter: Payer: Self-pay | Admitting: Allergy and Immunology

## 2017-06-07 ENCOUNTER — Ambulatory Visit: Payer: Medicaid Other | Admitting: Cardiology

## 2017-06-13 ENCOUNTER — Other Ambulatory Visit: Payer: Self-pay | Admitting: Allergy and Immunology

## 2017-06-20 ENCOUNTER — Other Ambulatory Visit (HOSPITAL_COMMUNITY): Payer: Self-pay | Admitting: Psychiatry

## 2017-06-23 ENCOUNTER — Ambulatory Visit: Payer: Self-pay

## 2017-06-29 ENCOUNTER — Ambulatory Visit: Payer: Medicaid Other | Admitting: Cardiology

## 2017-08-18 ENCOUNTER — Ambulatory Visit: Payer: Self-pay | Admitting: Cardiology

## 2017-08-29 ENCOUNTER — Other Ambulatory Visit: Payer: Self-pay | Admitting: Cardiology

## 2017-09-12 ENCOUNTER — Encounter: Payer: Self-pay | Admitting: Cardiology

## 2017-09-12 ENCOUNTER — Ambulatory Visit: Payer: Medicaid Other | Admitting: Cardiology

## 2017-09-12 VITALS — BP 100/80 | HR 113 | Ht 63.0 in | Wt >= 6400 oz

## 2017-09-12 DIAGNOSIS — R000 Tachycardia, unspecified: Secondary | ICD-10-CM

## 2017-09-12 NOTE — Patient Instructions (Addendum)
Medication Instructions:  Your physician recommends that you continue on your current medications as directed. Please refer to the Current Medication list given to you today.   Labwork: None.  Testing/Procedures: Your physician has recommended that you wear a holter monitor. Holter monitors are medical devices that record the heart's electrical activity. Doctors most often use these monitors to diagnose arrhythmias. Arrhythmias are problems with the speed or rhythm of the heartbeat. The monitor is a small, portable device. You can wear one while you do your normal daily activities. This is usually used to diagnose what is causing palpitations/syncope (passing out). Wear for 48 hours.   Follow-Up: Your physician recommends that you schedule a follow-up appointment in: 1 month.    Any Other Special Instructions Will Be Listed Below (If Applicable).     If you need a refill on your cardiac medications before your next appointment, please call your pharmacy.   Holter Monitoring A Holter monitor is a small device that is used to detect abnormal heart rhythms. It clips to your clothing and is connected by wires to flat, sticky disks (electrodes) that attach to your chest. It is worn continuously for 24-48 hours. Follow these instructions at home:  Wear your Holter monitor at all times, even while exercising and sleeping, for as long as directed by your health care provider.  Make sure that the Holter monitor is safely clipped to your clothing or close to your body as recommended by your health care provider.  Do not get the monitor or wires wet.  Do not put body lotion or moisturizer on your chest.  Keep your skin clean.  Keep a diary of your daily activities, such as walking and doing chores. If you feel that your heartbeat is abnormal or that your heart is fluttering or skipping a beat: ? Record what you are doing when it happens. ? Record what time of day the symptoms  occur.  Return your Holter monitor as directed by your health care provider.  Keep all follow-up visits as directed by your health care provider. This is important. Get help right away if:  You feel lightheaded or you faint.  You have trouble breathing.  You feel pain in your chest, upper arm, or jaw.  You feel sick to your stomach and your skin is pale, cool, or damp.  You heartbeat feels unusual or abnormal. This information is not intended to replace advice given to you by your health care provider. Make sure you discuss any questions you have with your health care provider. Document Released: 10/24/2003 Document Revised: 07/03/2015 Document Reviewed: 09/03/2013 Elsevier Interactive Patient Education  [employer]2018 [employer]

## 2017-09-12 NOTE — Progress Notes (Signed)
Cardiology Office Note:    Date:  09/12/2017   ID:  Misty [name], DOB [birth year], MRN [id number]  PCP:  Nancie Neas, PA  Cardiologist:  [name], MD    Referring MD: Nancie Neas, [place]   Chief Complaint  Patient presents with  . Follow-up  I have fast heart rate  History of Present Illness:    Misty [name] is a 22 y.o. female with multiple medical problems including morbid obesity, lymphedema, bipolar disorder came to me this time complaining of palpitations that she is aware of her heart beating fast she says she always has a fast rate previously she was given 25 mg carvedilol twice daily with good effect however because of her history of bipolar disorder I understand that being withdrawn.  Described to have fatigue tiredness try to walk on the regular basis because of her severe weight that is a problem.  She also was diagnosed with lymphedema and appropriately treated by physical therapy with improvement.  Past Medical History:  Diagnosis Date  . Allergic rhinitis   . Anxiety   . Depression   . GERD (gastroesophageal reflux disease)   . HTN (hypertension) 07/13/12  . IBS (irritable bowel syndrome)   . Obesity   . OSA on CPAP   . Psoriasis     Past Surgical History:  Procedure Laterality Date  . ADENOIDECTOMY    . SINOSCOPY    . TONSILLECTOMY AND ADENOIDECTOMY  Age 64   Deviated septum corrected at same time    Current Medications: Current Meds  Medication Sig  . alosetron (LOTRONEX) 0.5 MG tablet Take 0.5 mg by mouth 2 (two) times daily.  Marland Kitchen buPROPion (WELLBUTRIN XL) 300 MG 24 hr tablet Take 1 tablet (300 mg total) by mouth daily. For mood control  . divalproex (DEPAKOTE ER) 250 MG 24 hr tablet Take 1 tablet (250 mg total) by mouth daily. For mood control  . divalproex (DEPAKOTE ER) 500 MG 24 hr tablet Take 1 tablet (500 mg total) by mouth 2 (two) times daily.  Marland Kitchen EPINEPHrine 0.3 mg/0.3 mL IJ SOAJ injection Use as directed for life threatening allergic  reaction  . Erenumab-aooe (AIMOVIG) 140 MG/ML SOAJ Inject into the skin.  . ferrous sulfate 325 (65 FE) MG tablet See admin instructions.  . fexofenadine (ALLEGRA) 180 MG tablet Take 1 tablet (180 mg total) by mouth daily.  . haloperidol (HALDOL) 5 MG tablet Take 5 mg by mouth 3 (three) times daily.  . hydrOXYzine (ATARAX/VISTARIL) 25 MG tablet Take 1 tablet (25 mg total) by mouth 3 (three) times daily as needed for anxiety.  . Ixekizumab (TALTZ) 80 MG/ML SOAJ Inject into the skin.  Marland Kitchen levothyroxine (SYNTHROID, LEVOTHROID) 25 MCG tablet Take 1 tablet (25 mcg total) by mouth daily before breakfast.  . loratadine (CLARITIN) 10 MG tablet Take 1 tablet (10 mg total) by mouth daily.  . montelukast (SINGULAIR) 10 MG tablet Take 10 mg by mouth at bedtime.  . paliperidone (INVEGA SUSTENNA) 234 MG/1.5ML SUSP injection Inject 234 mg into the muscle once for 1 dose. Next injection due 05-05-17  . prazosin (MINIPRESS) 2 MG capsule Take 1 capsule (2 mg total) by mouth at bedtime. (Patient taking differently: Take 1 mg by mouth at bedtime. )  . ranitidine (ZANTAC) 300 MG tablet TAKE ONE TABLET BY MOUTH AT BEDTIME  . traZODone (DESYREL) 50 MG tablet Take 1 tablet (50 mg total) by mouth at bedtime as needed for sleep. (Patient taking differently: Take 100 mg by  mouth at bedtime as needed for sleep. )  . vitamin C (ASCORBIC ACID) 500 MG tablet Take by mouth.   Current Facility-Administered Medications for the 09/12/17 encounter (Office Visit) with Georgeanna LeaKrasowski, Emileigh Kellett J, MD  Medication  . Mepolizumab SOLR 100 mg     Allergies:   Ultram [tramadol]; Molds & smuts; and Adhesive [tape]   Social History   Socioeconomic History  . Marital status: Single    Spouse name: Not on file  . Number of children: Not on file  . Years of education: Not on file  . Highest education level: Not on file  Occupational History  . 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  Financial resource strain: Not on file  . Food insecurity:    Worry: Not on file    Inability: Not on file  . Transportation needs:    Medical: Not on file    Non-medical: Not on file  Tobacco Use  . Smoking status: Never Smoker  . Smokeless tobacco: Never Used  Substance and Sexual Activity  . Alcohol use: No  . Drug use: No  . Sexual activity: Never  Lifestyle  . Physical activity:    Days per week: Not on file    Minutes per session: Not on file  . Stress: Not on file  Relationships  . Social connections:    Talks on phone: Not on file    Gets together: Not on file    Attends religious service: Not on file    Active member of club or organization: Not on file    Attends meetings of clubs or organizations: Not on file    Relationship status: Not on file  Other Topics Concern  . Not on file  Social History Narrative  . Not on file     Family History: The patient's family history includes Allergic rhinitis in her mother; Bipolar disorder in her father; Depression in her maternal grandmother and mother; Diabetes in her maternal grandmother; Drug abuse in her father; Hypertension in her father and maternal grandmother. ROS:   Please see the history of present illness.    All 14 point review of systems negative except as described per history of present illness  EKGs/Labs/Other Studies Reviewed:      Recent Labs: 12/14/2016: BNP 14.0 04/17/2017: ALT 25; BUN 12; Creatinine, Ser 0.64; Hemoglobin 14.1; Platelets 225; Potassium 3.7; Sodium 140; TSH 5.514  Recent Lipid Panel    Component Value Date/Time   CHOL 155 04/17/2017 0644   TRIG 135 04/17/2017 0644   HDL 46 04/17/2017 0644   CHOLHDL 3.4 04/17/2017 0644   VLDL 27 04/17/2017 0644   LDLCALC 82 04/17/2017 0644    Physical Exam:    VS:  BP 100/80 (BP Location: Right Arm, Patient Position: Sitting, Cuff Size: Large)   Pulse (!) 113   Ht 5\' 3"  (1.6 m)   Wt (!) 412 lb (186.9 kg)   SpO2 97%   BMI 72.98 kg/m      Wt Readings from Last 3 Encounters:  09/12/17 (!) 412 lb (186.9 kg)  12/14/16 (!) 439 lb (199.1 kg)  04/04/16 (!) 411 lb 11.2 oz (186.7 kg)     GEN:  Well nourished, well developed in no acute distress HEENT: Normal NECK: No JVD; No carotid bruits LYMPHATICS: No lymphadenopathy CARDIAC: RRR, no murmurs, no rubs, no gallops RESPIRATORY:  Clear to auscultation without rales, wheezing or rhonchi  ABDOMEN: Soft, non-tender, non-distended MUSCULOSKELETAL:  No edema; No deformity  SKIN: Warm and dry LOWER EXTREMITIES: no swelling NEUROLOGIC:  Alert and oriented x 3 PSYCHIATRIC:  Normal affect   ASSESSMENT:    No diagnosis found. PLAN:    In order of problems listed above:  1. Tachycardia: Will ask her to wear 48 hours Holter monitor to see exactly what kind of arrhythmia if any with dealing with.  In the future she may require some beta-blocker however I would prefer to avoid it with her bipolar disorder.  If she will have significant tachycardia.  Of 48 hours then will order echocardiogram to check left ventricular ejection fraction make sure that she does not have tachycardia induced cardiomyopathy. 2. Obstructive sleep apnea is being managed with CPAP mask. 3. Bipolar disorder followed by psychiatry. 4. Morbid obesity: Managed by multispecialty team.   Medication Adjustments/Labs and Tests Ordered: Current medicines are reviewed at length with the patient today.  Concerns regarding medicines are outlined above.  No orders of the defined types were placed in this encounter.  Medication changes: No orders of the defined types were placed in this encounter.   Signed, Georgeanna Lea, MD, [facility] 09/12/2017 2:56 PM    [facility]

## 2017-09-27 ENCOUNTER — Other Ambulatory Visit: Payer: Self-pay | Admitting: Cardiology

## 2017-10-14 ENCOUNTER — Ambulatory Visit (INDEPENDENT_AMBULATORY_CARE_PROVIDER_SITE_OTHER): Payer: Medicaid Other

## 2017-10-14 DIAGNOSIS — R000 Tachycardia, unspecified: Secondary | ICD-10-CM | POA: Diagnosis not present

## 2017-10-21 ENCOUNTER — Telehealth: Payer: Self-pay | Admitting: *Deleted

## 2017-10-21 ENCOUNTER — Ambulatory Visit: Payer: Medicaid Other | Admitting: Cardiology

## 2017-10-21 ENCOUNTER — Encounter: Payer: Self-pay | Admitting: Cardiology

## 2017-10-21 VITALS — BP 120/70 | HR 108 | Ht 63.0 in | Wt >= 6400 oz

## 2017-10-21 DIAGNOSIS — G4733 Obstructive sleep apnea (adult) (pediatric): Secondary | ICD-10-CM

## 2017-10-21 DIAGNOSIS — I10 Essential (primary) hypertension: Secondary | ICD-10-CM | POA: Diagnosis not present

## 2017-10-21 MED ORDER — LISINOPRIL 10 MG PO TABS: 10.0000 mg | ORAL_TABLET | Freq: Every day | ORAL | 3 refills | Status: DC

## 2017-10-21 NOTE — Telephone Encounter (Signed)
Pt states Dr. Kirtland BouchardK said she needs Lisinopril and would send in to pharmacy. No lisinopril was ever sent in. Please advise.

## 2017-10-21 NOTE — Progress Notes (Signed)
Cardiology Office Note:    Date:  10/21/2017   ID:  Misty [name], DOB February 02, 1996, MRN [id number]  PCP:  Nancie Neas, PA  Cardiologist:  [name], MD    Referring MD: Nancie Neas, [place]   Chief Complaint  Patient presents with  . Follow up on Holter  Doing well but still complain of having fast heartbeats  History of Present Illness:    Misty [name] is a 22 y.o. female with morbid obesity and bipolar disorder I will be seeing her for years for different issues shortness of breath fatigue echocardiogram always was good latest complaint was palpitations.  She is aware of her heart which she calls getting crazy she did wear a event recorder/Holter monitor which did not show any abnormality average heart rate was 85 doing 48 hours.  There was some extrasystole but very rare overall looks good.  Therefore I told her that probably the reason why she felt so bad initially was abrupt discontinuation of Coreg.  Now she is off it and her heart rate seems to be fine and I do not think she needs any intervention from that point review.  I encouraged her to exercise on the regular basis which she already does she walks about 10 minutes every day.  Encouraging her to do that  Past Medical History:  Diagnosis Date  . Allergic rhinitis   . Anxiety   . Depression   . GERD (gastroesophageal reflux disease)   . HTN (hypertension) 07/13/12  . IBS (irritable bowel syndrome)   . Obesity   . OSA on CPAP   . Psoriasis     Past Surgical History:  Procedure Laterality Date  . ADENOIDECTOMY    . SINOSCOPY    . TONSILLECTOMY AND ADENOIDECTOMY  Age 29   Deviated septum corrected at same time    Current Medications: Current Meds  Medication Sig  . alosetron (LOTRONEX) 0.5 MG tablet Take 0.5 mg by mouth 2 (two) times daily.  Marland Kitchen buPROPion (WELLBUTRIN XL) 300 MG 24 hr tablet Take 1 tablet (300 mg total) by mouth daily. For mood control  . carvedilol (COREG) 25 MG tablet TAKE ONE TABLET BY  MOUTH TWICE DAILY WITH meals  . divalproex (DEPAKOTE ER) 250 MG 24 hr tablet Take 1 tablet (250 mg total) by mouth daily. For mood control  . divalproex (DEPAKOTE ER) 500 MG 24 hr tablet Take 1 tablet (500 mg total) by mouth 2 (two) times daily.  Marland Kitchen EPINEPHrine 0.3 mg/0.3 mL IJ SOAJ injection Use as directed for life threatening allergic reaction  . Erenumab-aooe (AIMOVIG) 140 MG/ML SOAJ Inject into the skin.  . ferrous sulfate 325 (65 FE) MG tablet See admin instructions.  . fexofenadine (ALLEGRA) 180 MG tablet Take 1 tablet (180 mg total) by mouth daily.  . haloperidol (HALDOL) 5 MG tablet Take 5 mg by mouth 3 (three) times daily.  . hydrOXYzine (ATARAX/VISTARIL) 25 MG tablet Take 1 tablet (25 mg total) by mouth 3 (three) times daily as needed for anxiety.  . Ixekizumab (TALTZ) 80 MG/ML SOAJ Inject into the skin.  Marland Kitchen levothyroxine (SYNTHROID, LEVOTHROID) 25 MCG tablet Take 1 tablet (25 mcg total) by mouth daily before breakfast.  . loratadine (CLARITIN) 10 MG tablet Take 1 tablet (10 mg total) by mouth daily.  . montelukast (SINGULAIR) 10 MG tablet Take 10 mg by mouth at bedtime.  . paliperidone (INVEGA SUSTENNA) 234 MG/1.5ML SUSP injection Inject 234 mg into the muscle once for 1 dose. Next injection  due 05-05-17  . prazosin (MINIPRESS) 2 MG capsule Take 1 capsule (2 mg total) by mouth at bedtime. (Patient taking differently: Take 1 mg by mouth at bedtime. )  . ranitidine (ZANTAC) 300 MG tablet TAKE ONE TABLET BY MOUTH AT BEDTIME  . topiramate (TOPAMAX) 6 mg/mL SUSP Take by mouth.  . traZODone (DESYREL) 50 MG tablet Take 1 tablet (50 mg total) by mouth at bedtime as needed for sleep. (Patient taking differently: Take 100 mg by mouth at bedtime as needed for sleep. )  . vitamin C (ASCORBIC ACID) 500 MG tablet Take by mouth.   Current Facility-Administered Medications for the 10/21/17 encounter (Office Visit) with Georgeanna LeaKrasowski, Robert J, MD  Medication  . Mepolizumab SOLR 100 mg     Allergies:    Ultram [tramadol]; Molds & smuts; and Adhesive [tape]   Social History   Socioeconomic History  . Marital status: Single    Spouse name: Not on file  . Number of children: Not on file  . Years of education: Not on file  . Highest education level: Not on file  Occupational History  . 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 Financial resource strain: Not on file  . Food insecurity:    Worry: Not on file    Inability: Not on file  . Transportation needs:    Medical: Not on file    Non-medical: Not on file  Tobacco Use  . Smoking status: Never Smoker  . Smokeless tobacco: Never Used  Substance and Sexual Activity  . Alcohol use: No  . Drug use: No  . Sexual activity: Never  Lifestyle  . Physical activity:    Days per week: Not on file    Minutes per session: Not on file  . Stress: Not on file  Relationships  . Social connections:    Talks on phone: Not on file    Gets together: Not on file    Attends religious service: Not on file    Active member of club or organization: Not on file    Attends meetings of clubs or organizations: Not on file    Relationship status: Not on file  Other Topics Concern  . Not on file  Social History Narrative  . Not on file     Family History: The patient's family history includes Allergic rhinitis in her mother; Bipolar disorder in her father; Depression in her maternal grandmother and mother; Diabetes in her maternal grandmother; Drug abuse in her father; Hypertension in her father and maternal grandmother. ROS:   Please see the history of present illness.    All 14 point review of systems negative except as described per history of present illness  EKGs/Labs/Other Studies Reviewed:      Recent Labs: 12/14/2016: BNP 14.0 04/17/2017: ALT 25; BUN 12; Creatinine, Ser 0.64; Hemoglobin 14.1; Platelets 225; Potassium 3.7; Sodium 140; TSH 5.514  Recent Lipid Panel    Component Value  Date/Time   CHOL 155 04/17/2017 0644   TRIG 135 04/17/2017 0644   HDL 46 04/17/2017 0644   CHOLHDL 3.4 04/17/2017 0644   VLDL 27 04/17/2017 0644   LDLCALC 82 04/17/2017 0644    Physical Exam:    VS:  BP 120/70   Pulse (!) 108   Ht 5\' 3"  (1.6 m)   Wt (!) 404 lb 6.4 oz (183.4 kg)   SpO2 97%   BMI 71.64 kg/m  Wt Readings from Last 3 Encounters:  10/21/17 (!) 404 lb 6.4 oz (183.4 kg)  09/12/17 (!) 412 lb (186.9 kg)  12/14/16 (!) 439 lb (199.1 kg)     GEN:  Well nourished, well developed in no acute distress HEENT: Normal NECK: No JVD; No carotid bruits LYMPHATICS: No lymphadenopathy CARDIAC: RRR, no murmurs, no rubs, no gallops RESPIRATORY:  Clear to auscultation without rales, wheezing or rhonchi  ABDOMEN: Soft, non-tender, non-distended MUSCULOSKELETAL:  No edema; No deformity  SKIN: Warm and dry LOWER EXTREMITIES: no swelling NEUROLOGIC:  Alert and oriented x 3 PSYCHIATRIC:  Normal affect   ASSESSMENT:    1. Essential hypertension   2. Morbid obesity (HCC)   3. Obstructive sleep apnea    PLAN:    In order of problems listed above:  1. Essential hypertension blood pressure appears to be well controlled we will continue present management. 2. Morbid obesity she lost some weight which is encouraging we will continue present management 3. Obstructive sleep apnea using CPAP mask.   Medication Adjustments/Labs and Tests Ordered: Current medicines are reviewed at length with the patient today.  Concerns regarding medicines are outlined above.  No orders of the defined types were placed in this encounter.  Medication changes: No orders of the defined types were placed in this encounter.   Signed, Georgeanna Lea, MD, [facility], [place] 10/21/2017 12:01 PM    [facility]

## 2017-10-21 NOTE — Telephone Encounter (Signed)
Lisinopril 10 mg daily sent to patient pharmacy. Patient aware.

## 2017-10-21 NOTE — Progress Notes (Signed)
Per Dr. Bing MatterKrasowski patient is to be on lisinopril 10 mg daily. Patient aware and medication sent to patient preferred pharmacy.

## 2017-10-21 NOTE — Patient Instructions (Signed)
Medication Instructions:  Your physician recommends that you continue on your current medications as directed. Please refer to the Current Medication list given to you today.   Labwork: None ordered  Testing/Procedures: None ordered  Follow-Up: Your physician recommends that you schedule a follow-up appointment in: 6 months with Dr. Bing MatterKrasowski. You will receive a letter or phone call to schedule your appointment.   Any Other Special Instructions Will Be Listed Below (If Applicable).     If you need a refill on your cardiac medications before your next appointment, please call your pharmacy.

## 2017-10-21 NOTE — Addendum Note (Signed)
Addended by: Vanessa DurhamBOWMAN, Toney Difatta R on: 10/21/2017 05:00 PM   Modules accepted: Orders

## 2017-11-03 ENCOUNTER — Other Ambulatory Visit: Payer: Self-pay | Admitting: Allergy and Immunology

## 2017-11-03 MED ORDER — FLUTICASONE PROPIONATE HFA 110 MCG/ACT IN AERO: INHALATION_SPRAY | RESPIRATORY_TRACT | 0 refills | Status: DC

## 2017-11-14 ENCOUNTER — Other Ambulatory Visit: Payer: Self-pay | Admitting: Allergy and Immunology

## 2017-11-14 NOTE — Telephone Encounter (Signed)
Patient needs office visit.  

## 2017-11-28 ENCOUNTER — Ambulatory Visit: Payer: Medicaid Other | Admitting: Allergy and Immunology

## 2017-12-01 ENCOUNTER — Ambulatory Visit: Payer: Medicaid Other | Admitting: Allergy and Immunology

## 2017-12-05 ENCOUNTER — Ambulatory Visit: Payer: Self-pay | Admitting: Allergy and Immunology

## 2017-12-12 ENCOUNTER — Ambulatory Visit: Payer: Medicaid Other | Admitting: Allergy and Immunology

## 2017-12-12 ENCOUNTER — Other Ambulatory Visit: Payer: Self-pay | Admitting: Allergy and Immunology

## 2017-12-16 ENCOUNTER — Encounter (HOSPITAL_COMMUNITY): Payer: Self-pay | Admitting: *Deleted

## 2017-12-16 ENCOUNTER — Other Ambulatory Visit: Payer: Self-pay

## 2017-12-16 ENCOUNTER — Inpatient Hospital Stay (HOSPITAL_COMMUNITY)
Admission: RE | Admit: 2017-12-16 | Discharge: 2017-12-26 | DRG: 885 | Disposition: A | Discharge status: Home / Self Care | Payer: Medicaid Other | Attending: Psychiatry | Admitting: Psychiatry

## 2017-12-16 DIAGNOSIS — Z8249 Family history of ischemic heart disease and other diseases of the circulatory system: Secondary | ICD-10-CM

## 2017-12-16 DIAGNOSIS — Z91410 Personal history of adult physical and sexual abuse: Secondary | ICD-10-CM

## 2017-12-16 DIAGNOSIS — E669 Obesity, unspecified: Secondary | ICD-10-CM | POA: Diagnosis present

## 2017-12-16 DIAGNOSIS — Z833 Family history of diabetes mellitus: Secondary | ICD-10-CM

## 2017-12-16 DIAGNOSIS — R45851 Suicidal ideations: Secondary | ICD-10-CM | POA: Diagnosis present

## 2017-12-16 DIAGNOSIS — Z813 Family history of other psychoactive substance abuse and dependence: Secondary | ICD-10-CM

## 2017-12-16 DIAGNOSIS — F4310 Post-traumatic stress disorder, unspecified: Secondary | ICD-10-CM | POA: Diagnosis present

## 2017-12-16 DIAGNOSIS — F314 Bipolar disorder, current episode depressed, severe, without psychotic features: Principal | ICD-10-CM | POA: Diagnosis present

## 2017-12-16 DIAGNOSIS — Z6841 Body Mass Index (BMI) 40.0 and over, adult: Secondary | ICD-10-CM

## 2017-12-16 DIAGNOSIS — Z915 Personal history of self-harm: Secondary | ICD-10-CM | POA: Diagnosis not present

## 2017-12-16 DIAGNOSIS — Z87828 Personal history of other (healed) physical injury and trauma: Secondary | ICD-10-CM

## 2017-12-16 DIAGNOSIS — Z62810 Personal history of physical and sexual abuse in childhood: Secondary | ICD-10-CM | POA: Diagnosis present

## 2017-12-16 DIAGNOSIS — I10 Essential (primary) hypertension: Secondary | ICD-10-CM | POA: Diagnosis present

## 2017-12-16 DIAGNOSIS — F603 Borderline personality disorder: Secondary | ICD-10-CM | POA: Diagnosis present

## 2017-12-16 DIAGNOSIS — Z818 Family history of other mental and behavioral disorders: Secondary | ICD-10-CM | POA: Diagnosis not present

## 2017-12-16 DIAGNOSIS — J45909 Unspecified asthma, uncomplicated: Secondary | ICD-10-CM | POA: Diagnosis present

## 2017-12-16 DIAGNOSIS — R062 Wheezing: Secondary | ICD-10-CM

## 2017-12-16 HISTORY — DX: Bipolar disorder, current episode depressed, severe, without psychotic features: F31.4

## 2017-12-16 MED ORDER — DICYCLOMINE HCL 10 MG PO CAPS
10.0000 mg | ORAL_CAPSULE | Freq: Three times a day (TID) | ORAL | Status: DC
  Administered 2017-12-17 (×2): 20 mg via ORAL
  Administered 2017-12-17: 10 mg via ORAL
  Administered 2017-12-18: 20 mg via ORAL
  Administered 2017-12-18: 10 mg via ORAL
  Administered 2017-12-18: 20 mg via ORAL
  Administered 2017-12-19 – 2017-12-20 (×5): 10 mg via ORAL
  Administered 2017-12-20: 20 mg via ORAL
  Administered 2017-12-21: 10 mg via ORAL
  Administered 2017-12-21: 20 mg via ORAL
  Administered 2017-12-21: 10 mg via ORAL
  Administered 2017-12-22: 20 mg via ORAL
  Administered 2017-12-22 – 2017-12-23 (×4): 10 mg via ORAL
  Administered 2017-12-23 – 2017-12-24 (×2): 20 mg via ORAL
  Filled 2017-12-16 (×3): qty 2
  Filled 2017-12-16: qty 1
  Filled 2017-12-16 (×16): qty 2
  Filled 2017-12-16: qty 1
  Filled 2017-12-16 (×5): qty 2
  Filled 2017-12-16: qty 1
  Filled 2017-12-16 (×2): qty 2

## 2017-12-16 MED ORDER — NEOMYCIN-POLYMYXIN-HC 3.5-10000-1 OT SUSP
4.0000 [drp] | Freq: Four times a day (QID) | OTIC | Status: DC
  Administered 2017-12-16 – 2017-12-26 (×34): 4 [drp] via OTIC
  Filled 2017-12-16 (×2): qty 10

## 2017-12-16 MED ORDER — ACETAMINOPHEN 325 MG PO TABS
650.0000 mg | ORAL_TABLET | Freq: Four times a day (QID) | ORAL | Status: DC | PRN
  Administered 2017-12-17 – 2017-12-26 (×6): 650 mg via ORAL
  Filled 2017-12-16 (×6): qty 2

## 2017-12-16 MED ORDER — LISINOPRIL 10 MG PO TABS
10.0000 mg | ORAL_TABLET | Freq: Every day | ORAL | Status: DC
  Administered 2017-12-17: 10 mg via ORAL
  Filled 2017-12-16 (×2): qty 1

## 2017-12-16 MED ORDER — ALUM & MAG HYDROXIDE-SIMETH 200-200-20 MG/5ML PO SUSP: 30.0000 mL | ORAL | Status: DC | PRN

## 2017-12-16 MED ORDER — HALOPERIDOL 5 MG PO TABS
5.0000 mg | ORAL_TABLET | Freq: Three times a day (TID) | ORAL | Status: DC
  Administered 2017-12-17 – 2017-12-26 (×29): 5 mg via ORAL
  Filled 2017-12-16 (×38): qty 1

## 2017-12-16 MED ORDER — BUPROPION HCL ER (XL) 300 MG PO TB24
300.0000 mg | ORAL_TABLET | Freq: Every day | ORAL | Status: DC
  Administered 2017-12-17 – 2017-12-21 (×5): 300 mg via ORAL
  Filled 2017-12-16 (×6): qty 1

## 2017-12-16 MED ORDER — LORATADINE 10 MG PO TABS
10.0000 mg | ORAL_TABLET | Freq: Every day | ORAL | Status: DC
  Administered 2017-12-17 – 2017-12-26 (×10): 10 mg via ORAL
  Filled 2017-12-16 (×13): qty 1

## 2017-12-16 MED ORDER — PRAZOSIN HCL 1 MG PO CAPS
2.0000 mg | ORAL_CAPSULE | Freq: Every day | ORAL | Status: DC
  Administered 2017-12-16 – 2017-12-25 (×10): 2 mg via ORAL
  Filled 2017-12-16 (×13): qty 2

## 2017-12-16 MED ORDER — MOMETASONE FURO-FORMOTEROL FUM 200-5 MCG/ACT IN AERO
2.0000 | INHALATION_SPRAY | Freq: Two times a day (BID) | RESPIRATORY_TRACT | Status: DC
  Administered 2017-12-17 – 2017-12-26 (×18): 2 via RESPIRATORY_TRACT
  Filled 2017-12-16: qty 8.8

## 2017-12-16 MED ORDER — PANTOPRAZOLE SODIUM 40 MG PO TBEC
40.0000 mg | DELAYED_RELEASE_TABLET | Freq: Every day | ORAL | Status: DC
  Administered 2017-12-17 – 2017-12-26 (×10): 40 mg via ORAL
  Filled 2017-12-16 (×14): qty 1

## 2017-12-16 MED ORDER — MONTELUKAST SODIUM 10 MG PO TABS
10.0000 mg | ORAL_TABLET | Freq: Every day | ORAL | Status: DC
  Administered 2017-12-16 – 2017-12-25 (×10): 10 mg via ORAL
  Filled 2017-12-16 (×14): qty 1

## 2017-12-16 MED ORDER — TRAZODONE HCL 100 MG PO TABS
100.0000 mg | ORAL_TABLET | Freq: Every evening | ORAL | Status: DC | PRN
  Administered 2017-12-16 – 2017-12-18 (×3): 100 mg via ORAL
  Filled 2017-12-16 (×8): qty 1

## 2017-12-16 MED ORDER — HYDROXYZINE HCL 50 MG PO TABS
50.0000 mg | ORAL_TABLET | Freq: Four times a day (QID) | ORAL | Status: DC | PRN
  Administered 2017-12-16 – 2017-12-25 (×16): 50 mg via ORAL
  Filled 2017-12-16 (×16): qty 1

## 2017-12-16 MED ORDER — LEVOTHYROXINE SODIUM 25 MCG PO TABS
25.0000 ug | ORAL_TABLET | Freq: Every day | ORAL | Status: DC
  Administered 2017-12-17 – 2017-12-26 (×10): 25 ug via ORAL
  Filled 2017-12-16 (×13): qty 1

## 2017-12-16 MED ORDER — DIVALPROEX SODIUM 500 MG PO DR TAB
500.0000 mg | DELAYED_RELEASE_TABLET | Freq: Two times a day (BID) | ORAL | Status: DC
  Administered 2017-12-16 – 2017-12-26 (×20): 500 mg via ORAL
  Filled 2017-12-16 (×25): qty 1

## 2017-12-16 MED ORDER — DIVALPROEX SODIUM 250 MG PO DR TAB
250.0000 mg | DELAYED_RELEASE_TABLET | Freq: Every day | ORAL | Status: DC
  Administered 2017-12-16 – 2017-12-17 (×2): 250 mg via ORAL
  Filled 2017-12-16 (×5): qty 1

## 2017-12-16 MED ORDER — VENLAFAXINE HCL ER 37.5 MG PO CP24
37.5000 mg | ORAL_CAPSULE | Freq: Every day | ORAL | Status: DC
  Administered 2017-12-17: 37.5 mg via ORAL
  Filled 2017-12-16 (×2): qty 1

## 2017-12-16 MED ORDER — FERROUS SULFATE 325 (65 FE) MG PO TABS
325.0000 mg | ORAL_TABLET | Freq: Every day | ORAL | Status: DC
  Administered 2017-12-17 – 2017-12-26 (×10): 325 mg via ORAL
  Filled 2017-12-16 (×12): qty 1

## 2017-12-16 MED ORDER — MAGNESIUM HYDROXIDE 400 MG/5ML PO SUSP: 30.0000 mL | Freq: Every day | ORAL | Status: DC | PRN

## 2017-12-16 MED ORDER — IPRATROPIUM-ALBUTEROL 20-100 MCG/ACT IN AERS
1.0000 | INHALATION_SPRAY | Freq: Four times a day (QID) | RESPIRATORY_TRACT | Status: DC
  Administered 2017-12-16 – 2017-12-26 (×27): 1 via RESPIRATORY_TRACT
  Filled 2017-12-16 (×2): qty 4

## 2017-12-16 NOTE — Tx Team (Signed)
Initial Treatment Plan 12/16/2017 10:19 PM Misty [name] ZOX:[id number]    PATIENT STRESSORS: Marital or family conflict   PATIENT STRENGTHS: Ability for insight Average or above average intelligence Capable of independent living General fund of knowledge Motivation for treatment/growth Supportive family/friends   PATIENT IDENTIFIED PROBLEMS: Depression  Suicidal thoughts "Try to feel a little bit better"                     DISCHARGE CRITERIA:  Ability to meet basic life and health needs Improved stabilization in mood, thinking, and/or behavior Reduction of life-threatening or endangering symptoms to within safe limits Verbal commitment to aftercare and medication compliance  PRELIMINARY DISCHARGE PLAN: Attend aftercare/continuing care group  PATIENT/FAMILY INVOLVEMENT: This treatment plan has been presented to and reviewed with the patient, Misty [name], and/or family member, .  The patient and family have been given the opportunity to ask questions and make suggestions.  Evanne Matsunaga, [place], [place] 12/16/2017, 10:19 PM
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
## 2017-12-16 NOTE — H&P (Signed)
Behavioral Health Medical Screening Exam  Misty [name] is an 22 y.o. female accompanied with a female companion presents to [facility] as a walk-in endorsing MDD with SI. Patient with hx of Bipolar d/o. She is denying any mania/hypo-mania/AVH,paranoia, but marked pervasive depressive features.  Total Time spent with patient: 20 minutes  Psychiatric Specialty Exam: Physical Exam  Constitutional: She is oriented to person, place, and time. She appears well-developed and well-nourished. No distress.  HENT:  Head: Normocephalic.  Eyes: Pupils are equal, round, and reactive to light.  Respiratory: Effort normal and breath sounds normal. No respiratory distress.  Neurological: She is alert and oriented to person, place, and time. No cranial nerve deficit.  Skin: Skin is warm and dry. She is not diaphoretic.  Psychiatric: Her speech is normal. Judgment normal. Her affect is labile. She is slowed and withdrawn. Cognition and memory are normal. She exhibits a depressed mood. She expresses suicidal ideation. She expresses suicidal plans.    Review of Systems  Constitutional: Negative for chills, diaphoresis, fever, malaise/fatigue and weight loss.  Psychiatric/Behavioral: Positive for depression and suicidal ideas.  All other systems reviewed and are negative.   There were no vitals taken for this visit.There is no height or weight on file to calculate BMI.  General Appearance: Casual  Eye Contact:  Good  Speech:  Clear and Coherent  Volume:  Normal  Mood:  Depressed  Affect:  Congruent  Thought Process:  Goal Directed  Orientation:  Full (Time, Place, and Person)  Thought Content:  Logical  Suicidal Thoughts:  Yes.  with intent/plan  Homicidal Thoughts:  No  Memory:  Immediate;   Good  Judgement:  Fair  Insight:  Fair  Psychomotor Activity:  Normal  Concentration: Concentration: Fair  Recall:  Fair  Fund of Knowledge:Fair  Language: Fair  Akathisia:  Negative  Handed:  Right  AIMS  (if indicated):     Assets:  Desire for Improvement  Sleep:       Musculoskeletal: Strength & Muscle Tone: within normal limits Gait & Station: normal Patient leans: N/A  There were no vitals taken for this visit.  Recommendations:  Based on my evaluation the patient does not appear to have an emergency medical condition.  Kerry Hough, PA-C 12/16/2017, 9:24 PM

## 2017-12-16 NOTE — Progress Notes (Signed)
Misty [name] is a 22 year old female pt admitted on voluntary basis after presenting as a walk-in. She reports depression and SI currently but able to contract for safety while in the hospital. She reports that she has been taking her medications as prescribed but feels they are not helping currently. She also spoke about some family issues and spoke about how her brother is mean to her and reports that she is unsure if she wants to return back there. She also spoke about how her great aunt passed away and she is still grieving the loss of her. She denies any substance abuse issues. She reports that she goes to [facility] in [place] for services. She spoke about how she wants to feel better while she is here and also talk about maybe some new medications. [name] was escorted to the unit, oriented to the milieu and safety maintained.

## 2017-12-17 LAB — COMPREHENSIVE METABOLIC PANEL
ALK PHOS: 97 U/L (ref 38–126)
ALT: 27 U/L (ref 0–44)
AST: 22 U/L (ref 15–41)
Albumin: 4.1 g/dL (ref 3.5–5.0)
Anion gap: 12 (ref 5–15)
BILIRUBIN TOTAL: 0.8 mg/dL (ref 0.3–1.2)
BUN: 12 mg/dL (ref 6–20)
CALCIUM: 9.2 mg/dL (ref 8.9–10.3)
CO2: 23 mmol/L (ref 22–32)
CREATININE: 0.75 mg/dL (ref 0.44–1.00)
Chloride: 103 mmol/L (ref 98–111)
GFR calc Af Amer: >60 mL/min (ref >60)
Glucose, Bld: 106 mg/dL — ABNORMAL HIGH (ref 70–99)
Potassium: 4 mmol/L (ref 3.5–5.1)
Sodium: 138 mmol/L (ref 135–145)
TOTAL PROTEIN: 7.9 g/dL (ref 6.5–8.1)

## 2017-12-17 LAB — CBC WITH DIFFERENTIAL/PLATELET
Abs Immature Granulocytes: 0.03 10*3/uL (ref 0.00–0.07)
BASOS PCT: 1 %
Basophils Absolute: 0.1 10*3/uL (ref 0.0–0.1)
Eosinophils Absolute: 0.1 10*3/uL (ref 0.0–0.5)
Eosinophils Relative: 1 %
HCT: 45.6 % (ref 36.0–46.0)
HEMOGLOBIN: 14.7 g/dL (ref 12.0–15.0)
Immature Granulocytes: 0 %
Lymphocytes Relative: 28 %
Lymphs Abs: 3.8 10*3/uL (ref 0.7–4.0)
MCH: 28.2 pg (ref 26.0–34.0)
MCHC: 32.2 g/dL (ref 30.0–36.0)
MCV: 87.5 fL (ref 80.0–100.0)
MONO ABS: 1.3 10*3/uL — AB (ref 0.1–1.0)
MONOS PCT: 10 %
NRBC: 0 % (ref 0.0–0.2)
Neutro Abs: 8.3 10*3/uL — ABNORMAL HIGH (ref 1.7–7.7)
Neutrophils Relative %: 60 %
Platelets: 277 10*3/uL (ref 150–400)
RBC: 5.21 MIL/uL — ABNORMAL HIGH (ref 3.87–5.11)
RDW: 12.7 % (ref 11.5–15.5)
WBC: 13.6 10*3/uL — AB (ref 4.0–10.5)

## 2017-12-17 LAB — VALPROIC ACID LEVEL: Valproic Acid Lvl: 48 ug/mL — ABNORMAL LOW (ref 50.0–100.0)

## 2017-12-17 LAB — TSH: TSH: 3.278 u[IU]/mL (ref 0.350–4.500)

## 2017-12-17 MED ORDER — ENSURE ENLIVE PO LIQD
237.0000 mL | Freq: Two times a day (BID) | ORAL | Status: DC | PRN
  Administered 2017-12-18: 237 mL via ORAL
  Filled 2017-12-17: qty 237

## 2017-12-17 MED ORDER — LISINOPRIL 20 MG PO TABS
20.0000 mg | ORAL_TABLET | Freq: Every day | ORAL | Status: DC
  Administered 2017-12-18 – 2017-12-19 (×2): 20 mg via ORAL
  Filled 2017-12-17 (×3): qty 1

## 2017-12-17 MED ORDER — VENLAFAXINE HCL ER 75 MG PO CP24
112.5000 mg | ORAL_CAPSULE | Freq: Every day | ORAL | Status: DC
  Administered 2017-12-18 – 2017-12-20 (×3): 112.5 mg via ORAL
  Filled 2017-12-17 (×5): qty 1

## 2017-12-17 MED ORDER — VENLAFAXINE HCL ER 75 MG PO CP24: 75.0000 mg | ORAL_CAPSULE | Freq: Every day | ORAL | Status: DC

## 2017-12-17 MED ORDER — VENLAFAXINE HCL ER 37.5 MG PO CP24
37.5000 mg | ORAL_CAPSULE | Freq: Once | ORAL | Status: AC
  Administered 2017-12-17: 37.5 mg via ORAL
  Filled 2017-12-17 (×2): qty 1

## 2017-12-17 NOTE — Progress Notes (Signed)
Adult Psychoeducational Group Note  Date:  12/17/2017 Time:  9:19 PM  Group Topic/Focus:  Wrap-Up Group:   The focus of this group is to help patients review their daily goal of treatment and discuss progress on daily workbooks.  Participation Level:  Active  Participation Quality:  Appropriate  Affect:  Appropriate  Cognitive:  Appropriate  Insight: Appropriate  Engagement in Group:  Engaged  Modes of Intervention:  Discussion  Additional Comments: The patient expressed that she rates today a 6.The patient also said that her goal is to have a good day.  Octavio Manns 12/17/2017, 9:19 PM

## 2017-12-17 NOTE — Progress Notes (Signed)
Adult Psychoeducational Group Note  Date:  12/17/2017 Time:  6:28 PM  Group Topic/Focus:  Goals Group:   The focus of this group is to help patients establish daily goals to achieve during treatment and discuss how the patient can incorporate goal setting into their daily lives to aide in recovery.  Participation Level:  Active  Participation Quality:  Appropriate  Affect:  Appropriate  Cognitive:  Appropriate  Insight: Good  Engagement in Group:  Engaged  Modes of Intervention:  Discussion  Additional Comments:  Pt attended group and participated in group discussion.  Jovani Flury R Ethie Curless 12/17/2017, 6:28 PM
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
## 2017-12-17 NOTE — [facility] Group Notes (Signed)
LCSW Group Therapy Note  12/17/2017    9:15-10:00am   Type of Therapy and Topic:  Group Therapy: Anger and Coping Skills  Participation Level:  Active   Description of Group:   In this group, patients learned how to recognize the physical, cognitive, emotional, and behavioral responses they have to anger-provoking situations.  They identified how they usually or often react when angered, and learned how healthy and unhealthy coping skills work initially, but the unhealthy ones stop working.   They analyzed how their frequently-chosen coping skill is possibly beneficial and how it is possibly unhelpful.  The group discussed a variety of healthier coping skills that could help in resolving the actual issues, as well as how to go about planning for the the possibility of future similar situations.  Therapeutic Goals: 1. Patients will identify one thing that makes them angry and how they feel emotionally and physically, what their thoughts are or tend to be in those situations, and what healthy or unhealthy coping mechanism they typically use 2. Patients will identify how their coping technique works for them, as well as how it works against them. 3. Patients will explore possible new behaviors to use in future anger situations. 4. Patients will learn that anger itself is normal and cannot be eliminated, and that healthier coping skills can assist with resolving conflict rather than worsening situations.  Summary of Patient Progress:  The patient shared that she was "angry" a few minutes ago.  She talked about wearing a mask at all times to hide all her emotions, since she was raised that it was not proper to show one's feelings.  She listened attentively and seemed willing to consider that anger is a sign something needs to change, but the idea that "there is nothing wrong with anger" will likely be a difficult concept for her to accept.  Therapeutic Modalities:   Cognitive Behavioral  Therapy Motivation Interviewing  Lynnell Chad  .

## 2017-12-17 NOTE — Plan of Care (Signed)
D: Patient presents depressed, anxious. She came to RN having self-harm thoughts with a plan to scratch herself with her fingernails on her forearms. She already has superficial scratches from prior to admission. Patient contracts for safety, and was given a rubber-band to use for distress tolerance. She slept poorly last night and received medication, but did not receive a repeat dose of trazodone. Her appetite is good, energy low and concentration poor. She rates her depression, feeling of hopelessness and anxiety 10/10. She complains of agitation, runny nose and irritability. Patient denies SI/HI/AVH.  A: Patient checked q15 min, and checks reviewed. Reviewed medication changes with patient and educated on side effects. Educated patient on importance of attending group therapy sessions and educated on several coping skills. Encouarged participation in milieu through recreation therapy and attending meals with peers. Support and encouragement provided. Fluids offered. R: Patient receptive to education on medications, and is medication compliant. Patient contracts for safety on the unit. Goal: "to be more good thoughts" and "thank positive."

## 2017-12-17 NOTE — Progress Notes (Signed)
D: Pt was in the dayroom upon initial approach.  Pt presents with depressed affect and mood.  She describes her day as "so so" and reports goal tonight is to "think positive."  Pt denies HI, denies hallucinations, reports pain from headache of 8/10.  She reports SI without a plan.  Pt verbally contracts for safety.  Pt has been visible in milieu interacting with peers and staff cautiously.  Pt attended evening group.    A: Introduced self to pt.  Met with pt 1:1.  Actively listened to pt and offered support and encouragement.  Medications administered per order.  PRN medication administered for anxiety and pain.  Q15 minute safety checks maintained.  R: Pt is safe on the unit.  Pt is compliant with medications.  Pt verbally contracts for safety.  Will continue to monitor and assess.

## 2017-12-18 ENCOUNTER — Inpatient Hospital Stay (HOSPITAL_COMMUNITY)
Admission: RE | Admit: 2017-12-18 | Discharge: 2017-12-18 | Disposition: A | Discharge status: Home / Self Care | Payer: Medicaid Other | Source: Home / Self Care | Attending: Psychiatry | Admitting: Psychiatry

## 2017-12-18 DIAGNOSIS — F314 Bipolar disorder, current episode depressed, severe, without psychotic features: Principal | ICD-10-CM

## 2017-12-18 MED ORDER — DIVALPROEX SODIUM 500 MG PO DR TAB
500.0000 mg | DELAYED_RELEASE_TABLET | Freq: Every day | ORAL | Status: DC
  Administered 2017-12-18 – 2017-12-25 (×8): 500 mg via ORAL
  Filled 2017-12-18 (×10): qty 1

## 2017-12-18 MED ORDER — CLOTRIMAZOLE 1 % EX CREA
TOPICAL_CREAM | Freq: Two times a day (BID) | CUTANEOUS | Status: DC
  Administered 2017-12-19 (×2): via TOPICAL
  Administered 2017-12-20 (×2): 1 via TOPICAL
  Administered 2017-12-21 – 2017-12-26 (×7): via TOPICAL
  Filled 2017-12-18: qty 15

## 2017-12-18 NOTE — Progress Notes (Signed)
Patient did attend the first 15 minutes of the evening speaker AA meeting. Pt exited group and went to the 400 hall but wrap up group was over. Pt returned to her room to shower.

## 2017-12-18 NOTE — Progress Notes (Signed)
D: Patient observed up visiting with family at start of shift. Patient psychomotor retarded and slow to respond. Patient's affect blank, mood anxious, depressed and somewhat helpless. Patient denies HI/AVH but endorses passive SI. Denies plan, intent and acknowledges that her thoughts are more urges to superficially cut. Patient is childlike. Denies pain, physical complaints.   A: Medicated per orders, no prns needed or requested thus far. Medication education provided. Level III obs in place for safety. Emotional support offered and coping skills suggested for self harm urges. Patient encouraged to complete Suicide Safety Plan before discharge. Encouraged to attend and participate in unit programming.   R: Patient verbalizes understanding of POC. Verbal contract in place for safety. Patient remains safe on level III obs. Will continue to monitor throughout the night.
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
## 2017-12-18 NOTE — [facility] Group Notes (Signed)
[facility] Group Notes:  (Nursing)  Date:  12/18/2017  Time:  130 PM Type of Therapy:  Nurse Education  Participation Level:  Did Not Attend   Shela Nevin 12/18/2017, 2:35 PM

## 2017-12-18 NOTE — Plan of Care (Signed)
D: Patient presents calm and cooperative. She reports feeling "weird" about "smiling more" today. She is still having suicidal thoughts, but no plan or intent. She slept fair last night, and requested medication that was helpful. Her appetite is fair, energy low and concentration poor. She rates her depression, feeling of hopelessness and anxiety 10/10. She reports feeling irritable. She denies physical complaints. Patient denies HI/AVH.  A: Patient checked q15 min, and checks reviewed. Reviewed medication changes with patient and educated on side effects. Educated patient on importance of attending group therapy sessions and educated on several coping skills. Encouarged participation in milieu through recreation therapy and attending meals with peers. Support and encouragement provided. Fluids offered. R: Patient receptive to education on medications, and is medication compliant. Patient contracts for safety on the unit. Her goal today is "to do what's best for me" and "talk to my Child psychotherapist."

## 2017-12-18 NOTE — Plan of Care (Signed)
  Problem: Self-Concept: Goal: Ability to disclose and discuss suicidal ideas will improve Outcome: Progressing Note:  Pt endorses SI without plan tonight.  She verbally contracts for safety.

## 2017-12-18 NOTE — [facility] Group Notes (Signed)
[facility] LCSW Group Therapy Note  12/18/2017  9:00-10:00AM  Type of Therapy and Topic:  Group Therapy:  Adding Supports Including Being Your Own Support  Participation Level:  Active   Description of Group:  Patients in this group were introduced to the concept that additional supports including self-support are an essential part of recovery.  A song entitled "I Need Help!" was played and a group discussion was held in reaction to the idea of needing to add supports.  A song entitled "My Own Hero" was played and a group discussion ensued in which patients stated they could relate to the song and it inspired them to realize they have be willing to help themselves in order to succeed, because other people cannot achieve sobriety or stability for them.  We discussed adding a variety of healthy supports to address the various needs in their lives.  A song was played called "I Know Where I've Been" toward the end of group and used to conduct an inspirational wrap-up to group of remembering how far they have already come in their journey.  Therapeutic Goals: 1)  demonstrate the importance of being a part of one's own support system 2)  discuss reasons people in one's life may eventually be unable to be continually supportive  3)  identify the patient's current support system and   4)  elicit commitments to add healthy supports and to become more conscious of being self-supportive   Summary of Patient Progress:  The patient expressed that she has few healthy supports in her life, although she does feel that her new [employer] Case Coordinator is really working to help her.  She asked numerous questions about how to set boundaries with her mother when her mother does not want to give up being her Representative Payee and does not want her to move out on her own.  The other group members gave well-thought-out advice about this and the patient seemed to both appreciate and be able/willing to hear the comments in a  positive manner.  Several times she asked questions to the entire group, and after group was taken aside by CSW who gave her positive feedback for speaking up for herself and asking for support from others.  Therapeutic Modalities:   Motivational Interviewing Activity  Lynnell Chad

## 2017-12-19 ENCOUNTER — Ambulatory Visit: Payer: Medicaid Other | Admitting: Allergy and Immunology

## 2017-12-19 MED ORDER — TRAZODONE HCL 150 MG PO TABS
150.0000 mg | ORAL_TABLET | Freq: Every evening | ORAL | Status: DC | PRN
  Administered 2017-12-19: 150 mg via ORAL
  Filled 2017-12-19 (×7): qty 1

## 2017-12-19 MED ORDER — LISINOPRIL 20 MG PO TABS
40.0000 mg | ORAL_TABLET | Freq: Every day | ORAL | Status: DC
  Administered 2017-12-20 – 2017-12-26 (×7): 40 mg via ORAL
  Filled 2017-12-19 (×2): qty 2
  Filled 2017-12-19 (×3): qty 1
  Filled 2017-12-19 (×2): qty 2
  Filled 2017-12-19: qty 1
  Filled 2017-12-19: qty 2
  Filled 2017-12-19: qty 1

## 2017-12-19 MED ORDER — LISINOPRIL 20 MG PO TABS
20.0000 mg | ORAL_TABLET | Freq: Once | ORAL | Status: AC
  Administered 2017-12-19: 20 mg via ORAL
  Filled 2017-12-19 (×2): qty 1

## 2017-12-19 NOTE — Progress Notes (Signed)
Recreation Therapy Notes  Date: 11.11.19 Time: 0930 Location: [address] Dayroom  Group Topic: Stress Management  Goal Area(s) Addresses:  Patient will verbalize importance of using healthy stress management.  Patient will identify positive emotions associated with healthy stress management.   Behavioral Response: Engaged  Intervention: Stress Management  Activity :  Progressive Muscle Relaxation.  LRT introduced the stress management technique of progressive muscle relaxation.  LRT read a script to lead patients through a series of exercises that allowed them to tense and then relax each muscle group individually.    Education:  Stress Management, Discharge Planning.   Education Outcome: Acknowledges edcuation/In group clarification offered/Needs additional education  Clinical Observations/Feedback: Pt attended and participated in group.    Lindsea Olivar, LRT/CTRS         Cheryal Salas A 12/19/2017 11:00 AM 

## 2017-12-19 NOTE — Tx Team (Signed)
Interdisciplinary Treatment and Diagnostic Plan Update  12/19/2017 Time of Session: 1610RU Misty [name] MRN: [id number]  Principal Diagnosis: Bipolar 1 disorder, depressed, severe (HCC)  Secondary Diagnoses: Principal Problem:   Bipolar 1 disorder, depressed, severe (HCC) Active Problems:   H/O ASTHMA   Borderline personality disorder (HCC)   Posttraumatic stress disorder   Current Medications:  Current Facility-Administered Medications  Medication Dose Route Frequency Provider Last Rate Last Dose  . acetaminophen (TYLENOL) tablet 650 mg  650 mg Oral Q6H PRN Kerry Hough, PA-C   650 mg at 12/19/17 0955  . alum & mag hydroxide-simeth (MAALOX/MYLANTA) 200-200-20 MG/5ML suspension 30 mL  30 mL Oral Q4H PRN Kerry Hough, PA-C      . buPROPion (WELLBUTRIN XL) 24 hr tablet 300 mg  300 mg Oral Daily Donell Sievert E, PA-C   300 mg at 12/19/17 0746  . clotrimazole (LOTRIMIN) 1 % cream   Topical BID Maryagnes Amos, FNP      . dicyclomine (BENTYL) capsule 10-20 mg  10-20 mg Oral TID AC Simon, Spencer E, PA-C   10 mg at 12/19/17 9147  . divalproex (DEPAKOTE) DR tablet 500 mg  500 mg Oral Q12H Donell Sievert E, PA-C   500 mg at 12/19/17 0746  . divalproex (DEPAKOTE) DR tablet 500 mg  500 mg Oral QHS Maryagnes Amos, FNP   500 mg at 12/18/17 2108  . feeding supplement (ENSURE ENLIVE) (ENSURE ENLIVE) liquid 237 mL  237 mL Oral BID BM & HS PRN Maryagnes Amos, FNP   237 mL at 12/18/17 1029  . ferrous sulfate tablet 325 mg  325 mg Oral Q breakfast Kerry Hough, PA-C   325 mg at 12/19/17 0746  . haloperidol (HALDOL) tablet 5 mg  5 mg Oral TID Donell Sievert E, PA-C   5 mg at 12/19/17 0746  . hydrOXYzine (ATARAX/VISTARIL) tablet 50 mg  50 mg Oral Q6H PRN Kerry Hough, PA-C   50 mg at 12/19/17 8295  . Ipratropium-Albuterol (COMBIVENT) respimat 1 puff  1 puff Inhalation Q6H Simon, Spencer E, PA-C   1 puff at 12/19/17 0747  . levothyroxine (SYNTHROID, LEVOTHROID)  tablet 25 mcg  25 mcg Oral Q0600 Kerry Hough, PA-C   25 mcg at 12/19/17 6213  . lisinopril (PRINIVIL,ZESTRIL) tablet 20 mg  20 mg Oral Once Antonieta Pert, MD      . Melene Muller ON 12/20/2017] lisinopril (PRINIVIL,ZESTRIL) tablet 40 mg  40 mg Oral Daily Antonieta Pert, MD      . loratadine (CLARITIN) tablet 10 mg  10 mg Oral Daily Kerry Hough, PA-C   10 mg at 12/19/17 0746  . magnesium hydroxide (MILK OF MAGNESIA) suspension 30 mL  30 mL Oral Daily PRN Kerry Hough, PA-C      . mometasone-formoterol (DULERA) 200-5 MCG/ACT inhaler 2 puff  2 puff Inhalation BID Kerry Hough, PA-C   2 puff at 12/19/17 0747  . montelukast (SINGULAIR) tablet 10 mg  10 mg Oral QHS Kerry Hough, PA-C   10 mg at 12/18/17 2108  . neomycin-polymyxin-hydrocortisone (CORTISPORIN) OTIC (EAR) suspension 4 drop  4 drop Both EARS Q6H Kerry Hough, PA-C   4 drop at 12/19/17 0865  . pantoprazole (PROTONIX) EC tablet 40 mg  40 mg Oral Daily Kerry Hough, PA-C   40 mg at 12/19/17 0746  . prazosin (MINIPRESS) capsule 2 mg  2 mg Oral QHS Donell Sievert E, PA-C   2 mg at 12/18/17  2121  . traZODone (DESYREL) tablet 150 mg  150 mg Oral QHS,MR X 1 Clary, Marlane Mingle, MD      . venlafaxine XR [facility]) 24 hr capsule 112.5 mg  112.5 mg Oral Q breakfast Antonieta Pert, MD   112.5 mg at 12/19/17 0746   PTA Medications: Facility-Administered Medications Prior to Admission  Medication Dose Route Frequency Provider Last Rate Last Dose  . Mepolizumab SOLR 100 mg  100 mg Subcutaneous Q28 days Jessica Priest, MD   100 mg at 05/26/17 1611   Medications Prior to Admission  Medication Sig Dispense Refill Last Dose  . ferrous sulfate 325 (65 FE) MG tablet Take by mouth.     . Paliperidone Palmitate ER (INVEGA SUSTENNA) 39 MG/0.25ML SUSY Inject into the muscle.     . prazosin (MINIPRESS) 1 MG capsule take 3 capsule by oral route once at night     . alosetron (LOTRONEX) 0.5 MG tablet Take 0.5 mg by mouth 2 (two)  times daily.  1 Taking  . buPROPion (WELLBUTRIN XL) 300 MG 24 hr tablet Take 1 tablet (300 mg total) by mouth daily. For mood control 30 tablet 0 Taking  . divalproex (DEPAKOTE ER) 250 MG 24 hr tablet Take 1 tablet (250 mg total) by mouth daily. For mood control 30 tablet 0 Taking  . divalproex (DEPAKOTE ER) 500 MG 24 hr tablet Take 1 tablet (500 mg total) by mouth 2 (two) times daily. 60 tablet 0 Taking  . EPINEPHrine 0.3 mg/0.3 mL IJ SOAJ injection Use as directed for life threatening allergic reaction 2 Device 3 Taking  . Erenumab-aooe (AIMOVIG) 140 MG/ML SOAJ Inject into the skin.   Taking  . fexofenadine (ALLEGRA) 180 MG tablet TAKE ONE TABLET BY MOUTH ONCE DAILY 30 tablet 0   . fluticasone (FLOVENT HFA) 110 MCG/ACT inhaler Inhale 3 puffs 3 times daily only during asthma flare ups. Rinse mouth after use. 1 Inhaler 0   . haloperidol (HALDOL) 5 MG tablet Take 5 mg by mouth 3 (three) times daily.  5 Taking  . hydrOXYzine (ATARAX/VISTARIL) 25 MG tablet Take 1 tablet (25 mg total) by mouth 3 (three) times daily as needed for anxiety. 30 tablet 0 Taking  . Ixekizumab (TALTZ) 80 MG/ML SOAJ Inject into the skin.   Taking  . levothyroxine (SYNTHROID, LEVOTHROID) 25 MCG tablet Take 1 tablet (25 mcg total) by mouth daily before breakfast. 30 tablet 0 Taking  . lisinopril (PRINIVIL,ZESTRIL) 10 MG tablet Take 1 tablet (10 mg total) by mouth daily. 90 tablet 3   . loratadine (CLARITIN) 10 MG tablet Take 1 tablet (10 mg total) by mouth daily. 30 tablet 0 Taking  . mometasone-formoterol (DULERA) 200-5 MCG/ACT AERO Inhale two puffs twice a day to prevent cough or wheeze. Rinse, gargle, and spit after use. 13 g 0   . montelukast (SINGULAIR) 10 MG tablet Take 10 mg by mouth at bedtime.   Taking  . paliperidone (INVEGA SUSTENNA) 234 MG/1.5ML SUSP injection Inject 234 mg into the muscle once for 1 dose. Next injection due 05-05-17 1.5 mL 0 Taking  . prazosin (MINIPRESS) 2 MG capsule Take 1 capsule (2 mg total) by  mouth at bedtime. (Patient taking differently: Take 1 mg by mouth at bedtime. ) 30 capsule 0 Taking  . ranitidine (ZANTAC) 300 MG tablet TAKE ONE TABLET BY MOUTH AT BEDTIME 30 tablet 5 Taking  . topiramate (TOPAMAX) 6 mg/mL SUSP Take by mouth.   Taking  . traZODone (DESYREL) 100 MG tablet  Take 100 mg by mouth at bedtime.  6   . traZODone (DESYREL) 50 MG tablet Take 1 tablet (50 mg total) by mouth at bedtime as needed for sleep. (Patient taking differently: Take 100 mg by mouth at bedtime as needed for sleep. ) 30 tablet 0 Taking    Patient Stressors: Marital or family conflict  Patient Strengths: Ability for insight Average or above average intelligence Capable of independent living General fund of knowledge Motivation for treatment/growth Supportive family/friends  Treatment Modalities: Medication Management, Group therapy, Case management,  1 to 1 session with clinician, Psychoeducation, Recreational therapy.   Physician Treatment Plan for Primary Diagnosis: Bipolar 1 disorder, depressed, severe (HCC) Long Term Goal(s): Improvement in symptoms so as ready for discharge Improvement in symptoms so as ready for discharge   Short Term Goals: Ability to identify changes in lifestyle to reduce recurrence of condition will improve Ability to verbalize feelings will improve Ability to disclose and discuss suicidal ideas Ability to demonstrate self-control will improve Ability to identify and develop effective coping behaviors will improve Ability to maintain clinical measurements within normal limits will improve Ability to identify changes in lifestyle to reduce recurrence of condition will improve Ability to verbalize feelings will improve Ability to disclose and discuss suicidal ideas Ability to demonstrate self-control will improve Ability to identify and develop effective coping behaviors will improve Ability to maintain clinical measurements within normal limits will  improve  Medication Management: Evaluate patient's response, side effects, and tolerance of medication regimen.  Therapeutic Interventions: 1 to 1 sessions, Unit Group sessions and Medication administration.  Evaluation of Outcomes: Progressing  Physician Treatment Plan for Secondary Diagnosis: Principal Problem:   Bipolar 1 disorder, depressed, severe (HCC) Active Problems:   H/O ASTHMA   Borderline personality disorder (HCC)   Posttraumatic stress disorder  Long Term Goal(s): Improvement in symptoms so as ready for discharge Improvement in symptoms so as ready for discharge   Short Term Goals: Ability to identify changes in lifestyle to reduce recurrence of condition will improve Ability to verbalize feelings will improve Ability to disclose and discuss suicidal ideas Ability to demonstrate self-control will improve Ability to identify and develop effective coping behaviors will improve Ability to maintain clinical measurements within normal limits will improve Ability to identify changes in lifestyle to reduce recurrence of condition will improve Ability to verbalize feelings will improve Ability to disclose and discuss suicidal ideas Ability to demonstrate self-control will improve Ability to identify and develop effective coping behaviors will improve Ability to maintain clinical measurements within normal limits will improve     Medication Management: Evaluate patient's response, side effects, and tolerance of medication regimen.  Therapeutic Interventions: 1 to 1 sessions, Unit Group sessions and Medication administration.  Evaluation of Outcomes: Progressing   RN Treatment Plan for Primary Diagnosis: Bipolar 1 disorder, depressed, severe (HCC) Long Term Goal(s): Knowledge of disease and therapeutic regimen to maintain health will improve  Short Term Goals: Ability to verbalize frustration and anger appropriately will improve, Ability to demonstrate self-control,  Ability to participate in decision making will improve and Ability to disclose and discuss suicidal ideas  Medication Management: RN will administer medications as ordered by provider, will assess and evaluate patient's response and provide education to patient for prescribed medication. RN will report any adverse and/or side effects to prescribing provider.  Therapeutic Interventions: 1 on 1 counseling sessions, Psychoeducation, Medication administration, Evaluate responses to treatment, Monitor vital signs and CBGs as ordered, Perform/monitor CIWA, COWS, AIMS and Fall Risk  screenings as ordered, Perform wound care treatments as ordered.  Evaluation of Outcomes: Progressing   LCSW Treatment Plan for Primary Diagnosis: Bipolar 1 disorder, depressed, severe (HCC) Long Term Goal(s): Safe transition to appropriate next level of care at discharge, Engage patient in therapeutic group addressing interpersonal concerns.  Short Term Goals: Engage patient in aftercare planning with referrals and resources, Increase social support, Increase emotional regulation and Increase skills for wellness and recovery  Therapeutic Interventions: Assess for all discharge needs, 1 to 1 time with Social worker, Explore available resources and support systems, Assess for adequacy in community support network, Educate family and significant other(s) on suicide prevention, Complete Psychosocial Assessment, Interpersonal group therapy.  Evaluation of Outcomes: Progressing   Progress in Treatment: Attending groups: Yes Participating in groups: Yes. Taking medication as prescribed: Yes. Toleration medication: Yes. Family/Significant other contact made: SPE completed with pt; pt declined to consent to collateral contact at this time.  Patient understands diagnosis: Yes. Discussing patient identified problems/goals with staff: Yes. Medical problems stabilized or resolved: Yes. Denies suicidal/homicidal ideation:  Yes. Issues/concerns per patient self-inventory: No. Other: n/a   New problem(s) identified: No, Describe:  n/a  New Short Term/Long Term Goal(s): medication management for mood stabilization; elimination of SI thoughts; development of comprehensive mental wellness/sobriety plan.   Patient Goals:  "To work on my suicidal thoughts and try to feel better."   Discharge Plan or Barriers: Pt plans to return home with family. She is connected with [facility] in [place], [place] and sees Dr. Scotty Court for primary care in [place], [place]. She also has a [facility] who is assisting her with searching for housing--CSW assessing and will contact Care Coordinator on Tuesday when offices open. [facility] pamphlet, Mobile Crisis information information provided to patient for additional community support and resources.   Reason for Continuation of Hospitalization: Anxiety Depression Medication stabilization Suicidal ideation  Estimated Length of Stay: Wed, 12/21/17  Attendees: Patient: 12/19/2017 11:25 AM  Physician: Dr. Altamese Salem MD 12/19/2017 11:25 AM  Nursing: Moshe Salisbury; [place] RN 12/19/2017 11:25 AM  RN Care Manager:x 12/19/2017 11:25 AM  Social Worker: Corrie Mckusick LCSW 12/19/2017 11:25 AM  Recreational Therapist: x 12/19/2017 11:25 AM  Other: Armandina Stammer NP; Hillery Jacks NP 12/19/2017 11:25 AM  Other:  12/19/2017 11:25 AM  Other: 12/19/2017 11:25 AM    Scribe for Treatment Team: Rona Ravens, LCSW 12/19/2017 11:25 AM

## 2017-12-19 NOTE — Progress Notes (Signed)
Adult Psychoeducational Group Note  Date:  12/19/2017 Time:  8:43 PM  Group Topic/Focus:  Wrap-Up Group:   The focus of this group is to help patients review their daily goal of treatment and discuss progress on daily workbooks.  Participation Level:  Active  Participation Quality:  Appropriate  Affect:  Appropriate  Cognitive:  Appropriate  Insight: Appropriate  Engagement in Group:  Engaged  Modes of Intervention:  Discussion  Additional Comments: The patient expressed that she 5.The patient also said that her goal is to attend groups tomorrow.  Octavio Manns 12/19/2017, 8:43 PM

## 2017-12-19 NOTE — [facility] Group Notes (Signed)
Adult Psychoeducational Group Note  Date:  12/19/2017 Time:  9:25 AM  Group Topic/Focus:  Orientation:   The focus of this group is to educate the patient on the purpose and policies of crisis stabilization and provide a format to answer questions about their admission.  The group details unit policies and expectations of patients while admitted.  Participation Level:  Active  Participation Quality:  Appropriate  Affect:  Appropriate  Cognitive:  Appropriate  Insight: Appropriate  Engagement in Group:  Engaged  Modes of Intervention:  Orientation  Additional Comments:  Pt attended orientation group facilitated by [facility] AJ  Dellia Nims 12/19/2017, 9:25 AM
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
## 2017-12-19 NOTE — [facility] Group Notes (Signed)
Adult Psychoeducational Group Note  Date:  12/19/2017 Time:  10:31 AM  Group Topic/Focus:  Wellness Toolbox:   The focus of this group is to discuss various aspects of wellness, balancing those aspects and exploring ways to increase the ability to experience wellness.  Patients will create a wellness toolbox for use upon discharge.  Participation Level:  Active  Participation Quality:  Appropriate  Affect:  Appropriate  Cognitive:  Appropriate  Insight: Appropriate  Engagement in Group:  Engaged  Modes of Intervention:  Orientation  Additional Comments:  Pt attended and participated in psycho educational group on wellness facilitated by [facility] AJ.  Dellia Nims 12/19/2017, 10:31 AM

## 2017-12-20 LAB — BASIC METABOLIC PANEL
Anion gap: 9 (ref 5–15)
BUN: 11 mg/dL (ref 6–20)
CHLORIDE: 105 mmol/L (ref 98–111)
CO2: 26 mmol/L (ref 22–32)
Calcium: 8.7 mg/dL — ABNORMAL LOW (ref 8.9–10.3)
Creatinine, Ser: 0.6 mg/dL (ref 0.44–1.00)
GFR calc non Af Amer: >60 mL/min (ref >60)
Glucose, Bld: 84 mg/dL (ref 70–99)
POTASSIUM: 4.1 mmol/L (ref 3.5–5.1)
SODIUM: 140 mmol/L (ref 135–145)

## 2017-12-20 MED ORDER — TRAZODONE HCL 150 MG PO TABS
150.0000 mg | ORAL_TABLET | Freq: Every evening | ORAL | Status: DC | PRN
  Administered 2017-12-20: 150 mg via ORAL
  Filled 2017-12-20: qty 1

## 2017-12-20 MED ORDER — VENLAFAXINE HCL ER 75 MG PO CP24
75.0000 mg | ORAL_CAPSULE | Freq: Every day | ORAL | Status: DC
  Administered 2017-12-21 – 2017-12-22 (×2): 75 mg via ORAL
  Filled 2017-12-20 (×3): qty 1

## 2017-12-20 NOTE — Progress Notes (Signed)
Pt reports she is still having passive suicidal thoughts, but can contract for safety with Clinical research associatewriter.  She denies HI/AVH.  She has been visible out in the milieu, and came early in the shift to get towels to take a shower although she did not shower until after group time.  After showering, writer still noticed a lot of flaking in pt's scalp.  She was given all her meds and lotrimin cream at [facility].  Pt was quiet and withdrawn most of the evening.  She was encouraged to make her needs known to staff.  Support and encouragement offered.  Discharge plans are in process.  Safety maintained with q15 minute checks.
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
## 2017-12-20 NOTE — Plan of Care (Signed)
Problem: Safety: Goal: Periods of time without injury will increase Outcome: Progressing   Problem: Medication: Goal: Compliance with prescribed medication regimen will improve Outcome: Progressing DAR Note: Pt presents with depressed affect and mood. A & O X4. Denies HI, AVH and pain when assessed. Endorsed passive SI, verbally contracts for safety. Scheduled medications given per MD's orders with verbal education and effects monitored. Emotional support offered. Encouraged pt to attend groups. EKG done and tolerated well. Safety checks maintained without self harm gestures or outburst to note at this time.  Pt visible in milieu at intervals during shift. Tolerates medications and PO intake well. Denies concerns. POC continues for safety and mood stability.

## 2017-12-20 NOTE — Progress Notes (Signed)
Recreation Therapy Notes  Animal-Assisted Activity (AAA) Program Checklist/Progress Notes Patient Eligibility Criteria Checklist & Daily Group note for Rec Tx Intervention  Date: 11.12.19 Time: 1430 Location: [address] Dayroom   AAA/T Program Assumption of Risk Form signed by Patient/ or Parent Legal Guardian YES   Patient is free of allergies or sever asthma  YES   Patient reports no fear of animals  YES  Patient reports no history of cruelty to animals YES   Patient understands his/her participation is voluntary YES      Patient washes hands before animal contact  YES   Patient washes hands after animal contact YES   Behavioral Response: Engaged  Education: Hand Washing, Appropriate Animal Interaction   Education Outcome: Acknowledges understanding/In group clarification offered/Needs additional education.   Clinical Observations/Feedback: Pt attended and participated in activity.     Misty [name], LRT/CTRS         Misty [name] 12/20/2017 3:17 PM 

## 2017-12-20 NOTE — BHH Group Notes (Signed)
Adult Psychoeducational Group Note  Date:  12/20/2017 Time:  9:45 PM  Group Topic/Focus:  Wrap-Up Group:   The focus of this group is to help patients review their daily goal of treatment and discuss progress on daily workbooks.  Participation Level:  Active  Participation Quality:  Appropriate and Attentive  Affect:  Appropriate  Cognitive:  Alert and Appropriate  Insight: Appropriate and Good  Engagement in Group:  Engaged  Modes of Intervention:  Discussion and Education  Additional Comments:  Tonight 400 hall participated in an activity based on the ABC's. Pt had the letter "I" for the word Idol. Pt told writer that the meaning of the word idol is wanting someone there as a Dance movement psychotherapistmentor to look up to.   Chrisandra NettersOctavia A Tinesha Siegrist 12/20/2017, 9:45 PM

## 2017-12-21 LAB — PREGNANCY, URINE: PREG TEST UR: NEGATIVE

## 2017-12-21 MED ORDER — BUPROPION HCL ER (XL) 150 MG PO TB24
450.0000 mg | ORAL_TABLET | Freq: Every day | ORAL | Status: DC
  Administered 2017-12-22 – 2017-12-26 (×5): 450 mg via ORAL
  Filled 2017-12-21 (×7): qty 3

## 2017-12-21 MED ORDER — TRAZODONE HCL 100 MG PO TABS
100.0000 mg | ORAL_TABLET | Freq: Every evening | ORAL | Status: DC | PRN
  Administered 2017-12-21 – 2017-12-23 (×3): 100 mg via ORAL
  Filled 2017-12-21 (×3): qty 1

## 2017-12-21 NOTE — Progress Notes (Signed)
Patient ID: Misty [name], female   DOB: Apr 18, 1995, 22 y.o.   MRN: [id number]   D: Patient has a flat affect on approach tonight. Reports she still has thoughts about harming self at times but no active thoughts at present. Reports a little mood improvement from admission but continues to have nightmares even on the Prazosin. She did brighten up when talking about her service dog "Nedra HaiLee" and talked about her volunteer work for the [employer]. A: Staff will continue to monitor on q 15 minute checks, follow treatment plan, and give medications as ordered. R: Cooperative on the unit.

## 2017-12-21 NOTE — Plan of Care (Signed)
  Problem: Activity: Goal: Sleeping patterns will improve Outcome: Progressing   Problem: Education: Goal: Emotional status will improve Outcome: Not Progressing Goal: Mental status will improve Outcome: Not Progressing   D: Patient's affect is flat, blunted; her mood is depressed.  She has passive thoughts of self harm; however, she is able to contract for safety.  Patient has been isolative to her room today.  Her goal today is to "find more coping skills." She reports she is sleeping and eating well; her energy level is low and her concentration is poor.    A: Continue to monitor medication management and MD orders.  Safety checks completed every 15 minutes per protocol.  Offer support and encouragement as needed.  R: Patient is receptive to staff; her behavior is appropriate.
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
## 2017-12-21 NOTE — [facility] Group Notes (Signed)
[facility] Group Notes:  Nursing  Date:  12/21/2017  Time:  4:50 PM  Type of Therapy: Relaxation/Meditation  Participation Level:  Minimal  Participation Quality:  Resistant  Affect:  Resistant  Cognitive:  Lacking  Insight:  Limited  Engagement in Group:  Resistant  Modes of Intervention:  Support  Summary of Progress/Problems:  Cranford MonBeaudry, Ryin Ambrosius Evans 12/21/2017, 4:50 PM

## 2017-12-21 NOTE — [facility] Group Notes (Signed)
[facility] [employer] Group Therapy      12/21/2017 10:32 AM  Type of Therapy: Mental Health Association Presentation  Participation Level: Active  Participation Quality: Attentive  Affect: Appropriate  Cognitive: Oriented  Insight: Developing/Improving  Engagement in Therapy: Engaged  Modes of Intervention: Discussion, Education and Socialization  Summary of Progress/Problems: Mental Health Association (MHA) Speaker came to talk about his personal journey with mental health. The pt processed ways by which to relate to the speaker. MHA speaker provided handouts and educational information pertaining to groups and services offered by the MHA. Pt was engaged in speaker's presentation and was receptive to resources provided.    Jacey Eckerson LCSWA Clinical Social Worker   
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
## 2017-12-22 LAB — VALPROIC ACID LEVEL: VALPROIC ACID LVL: 80 ug/mL (ref 50.0–100.0)

## 2017-12-22 MED ORDER — VENLAFAXINE HCL ER 37.5 MG PO CP24
37.5000 mg | ORAL_CAPSULE | Freq: Every day | ORAL | Status: DC
  Administered 2017-12-23: 37.5 mg via ORAL
  Filled 2017-12-22 (×3): qty 1

## 2017-12-22 NOTE — [facility] Group Notes (Signed)
LCSW Group Therapy Note 12/22/2017 2:19 PM  Type of Therapy/Topic: Group Therapy: Emotion Regulation  Participation Level: Active   Description of Group:  The purpose of this group is to assist patients in learning to regulate negative emotions and experience positive emotions. Patients will be guided to discuss ways in which they have been vulnerable to their negative emotions. These vulnerabilities will be juxtaposed with experiences of positive emotions or situations, and patients will be challenged to use positive emotions to combat negative ones. Special emphasis will be placed on coping with negative emotions in conflict situations, and patients will process healthy conflict resolution skills.  Therapeutic Goals: 1. Patient will identify two positive emotions or experiences to reflect on in order to balance out negative emotions 2. Patient will label two or more emotions that they find the most difficult to experience 3. Patient will demonstrate positive conflict resolution skills through discussion and/or role plays  Summary of Patient Progress:   Misty [name] was engaged and participated throughout the group session. Misty [name] reports that she struggles with managing anger, grief and sadness. Misty [name] states that she has struggled with managing her emotions majority of her life. Misty [name] shared with the group that she lacks social and family supports and that most of the time she has to share her thoughts and feelings with the family dog.    Therapeutic Modalities:  Cognitive Behavioral Therapy Feelings Identification Dialectical Behavioral Therapy   Misty [name] LCSWA Clinical Social Worker

## 2017-12-22 NOTE — [facility] Group Notes (Signed)
Pt was invited to psycho-ed group but did not attend. (facilitated by MHT Michael L)  

## 2017-12-22 NOTE — [facility] Group Notes (Signed)
Adult Psychoeducational Group Note  Date:  12/22/2017 Time:  8:41 AM  Group Topic/Focus:  Orientation:   The focus of this group is to educate the patient on the purpose and policies of crisis stabilization and provide a format to answer questions about their admission.  The group details unit policies and expectations of patients while admitted.  Participation Level:  Active  Participation Quality:  Appropriate  Affect:  Appropriate  Cognitive:  Alert  Insight: Appropriate  Engagement in Group:  Engaged  Modes of Intervention:  Orientation  Additional Comments:  Pt attended orientation group facilitated by MHT Leodis LiverpoolMichael L.  Laura-Lee Villegas M Beniah Magnan 12/22/2017, 8:41 AM
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
## 2017-12-22 NOTE — Progress Notes (Signed)
D: Pt was in dayroom upon initial approach.  Pt presents with anxious affect and mood.  She describes her day as "okay" and reports her goal is to "smile more."  Pt reports the best part of her day was "talking to mom."  Pt denies SI/HI, denies hallucinations, denies pain.  Pt has been visible in milieu interacting with peers and staff appropriately.  Pt attended evening group.    A: Introduced self to pt.  Met with pt 1:1.  Actively listened to pt and offered support and encouragement.  Medications administered per order.  PRN medication administered for anxiety and sleep.  Q15 minute safety checks maintained.  R: Pt is safe on the unit.  Pt is compliant with medications.  Pt verbally contracts for safety and reports she will inform staff of needs and concerns.  Will continue to monitor and assess.

## 2017-12-22 NOTE — Plan of Care (Signed)
  Problem: Education: Goal: Emotional status will improve Outcome: Not Progressing Goal: Mental status will improve Outcome: Not Progressing Goal: Verbalization of understanding the information provided will improve Outcome: Not Progressing   Problem: Activity: Goal: Interest or engagement in activities will improve Outcome: Not Progressing   Problem: Coping: Goal: Ability to verbalize frustrations and anger appropriately will improve Outcome: Not Progressing   D: Patient continues to present with flat, blunted affect.  She continues to have passive SI "sometimes."  Patient rates her depression, hopelessness and anxiety as a 10.  Her goal today is "to smile more."  She attends groups with minimal participation.  She exhibits attention seeking behavior at times.  Spoke with patient's mother yesterday, and she is concerned about the patient's discharge. Patient indicates that she does not want to return home, and will not give SW permission to speak with mother. She is also complaining of some "dizziness and headaches."  A: Continue to monitor medication management and MD orders.  Safety checks completed every 15 minutes per protocol.  Offer support and encouragement as needed.  R: Patient is receptive to staff; his/her behavior is appropriate.

## 2017-12-22 NOTE — [facility] Suicide Risk Assessment (Signed)
[facility] INPATIENT:  Family/Significant Other Suicide Prevention Education  Suicide Prevention Education:  Education Completed; Misty OxfordKristal [name], mother [phone]) has been identified by the patient as the family member/significant other with whom the patient will be residing, and identified as the person(s) who will aid the patient in the event of a mental health crisis (suicidal ideations/suicide attempt).  With written consent from the patient, the family member/significant other has been provided the following suicide prevention education, prior to the and/or following the discharge of the patient.  The suicide prevention education provided includes the following:  Suicide risk factors  Suicide prevention and interventions  National Suicide Hotline telephone number  [facility] telephone number  [facility] 911  [facility] and/or [facility] telephone number  Request made of family/significant other to:  Remove weapons (e.g., guns, rifles, knives), all items previously/currently identified as safety concern.    Remove drugs/medications (over-the-counter, prescriptions, illicit drugs), all items previously/currently identified as a safety concern.  The family member/significant other verbalizes understanding of the suicide prevention education information provided.  The family member/significant other agrees to remove the items of safety concern listed above.  Patient's mother states that she is concerned that the patient's mood has not improved. She reports that she believes the patient is continuing to endorse passive suicidal ideations.   Misty [name] 12/22/2017, 3:20 PM

## 2017-12-23 NOTE — Progress Notes (Signed)
D: Pt was in her room upon initial approach.  Pt presents with depressed affect and mood.  She describes her day as "so so."  Her goal tonight is to "be safe, sleep well, go to group."  She reports the best part of her day was talking to peers.  Pt denies HI, denies hallucinations, denies pain.  Pt endorses passive SI without a plan.  She verbally contracts for safety.  Pt has been visible in milieu interacting with peers and staff appropriately.  Pt attended evening group.    A: Introduced self to pt.  Met with pt 1:1.  Actively listened to pt and offered support and encouragement.  Medications administered per order.  PRN medication administered for sleep.  Encouraged pt to use positive coping skills.  She reports coping skills of: listening to music, playing with her dog "Truman Hayward," coloring, walking.  Q15 minute safety checks maintained.  R: Pt is safe on the unit.  Pt is compliant with medications.  Pt verbally contracts for safety.  Will continue to monitor and assess.
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
## 2017-12-23 NOTE — [facility] Group Notes (Signed)
LCSW Group Therapy Note 12/23/2017 2:12 PM  Type of Therapy and Topic: Group Therapy: Avoiding Self-Sabotaging and Enabling Behaviors  Participation Level: Did Not Attend  Description of Group:  In this group, patients will learn how to identify obstacles, self-sabotaging and enabling behaviors, as well as: what are they, why do we do them and what needs these behaviors meet. Discuss unhealthy relationships and how to have positive healthy boundaries with those that sabotage and enable. Explore aspects of self-sabotage and enabling in yourself and how to limit these self-destructive behaviors in everyday life.  Therapeutic Goals: 1. Patient will identify one obstacle that relates to self-sabotage and enabling behaviors 2. Patient will identify one personal self-sabotaging or enabling behavior they did prior to admission 3. Patient will state a plan to change the above identified behavior 4. Patient will demonstrate ability to communicate their needs through discussion and/or role play.   Summary of Patient Progress:  Invited, chose not to attend.     Therapeutic Modalities:  Cognitive Behavioral Therapy Person-Centered Therapy Motivational Interviewing   Alison Breeding LCSWA Clinical Social Worker   

## 2017-12-23 NOTE — Progress Notes (Signed)
Recreation Therapy Notes  Date: 11.15.19 Time: 0930 Location: [address] Dayroom  Group Topic: Stress Management  Goal Area(s) Addresses:  Patient will verbalize importance of using healthy stress management.  Patient will identify positive emotions associated with healthy stress management.   Intervention: Stress Management  Activity :  Guided Imagery.  LRT introduced the stress management technique of guided imagery.  LRT read a script that allowed patients to envision patients being on the beach enjoying the sounds of the waves.  Patients were to listen and follow along as script was read.  Education:  Stress Management, Discharge Planning.   Education Outcome: Acknowledges edcuation/In group clarification offered/Needs additional education  Clinical Observations/Feedback: Pt did not attend group.     Caroll RancherMarjette Aleeah Greeno, LRT/CTRS         Caroll RancherLindsay, Ranjit Ashurst A 12/23/2017 11:14 AM

## 2017-12-23 NOTE — Progress Notes (Addendum)
Misty [name] requested to speak with chaplain during rounds on 400 hall. Presented with limited affect, with moments of brightness as she thought about her emotional support dog.    Misty [name] initially expressed "I still don't think I'm grieving right."  Chaplain explored this with her and reiterated psycho social ed around grief - specifically, working to Mellon Financialclarify Misty [name]'s experience and address self-judgment.  Misty [name] clarified that "I feel like I should be feeling something that I'm not" going on to describe "I am worried that it is going to come out and I will harm myself"  Chaplain worked with Misty [name] around plans for support if she is feeling overwhelmed.  She works with ACT team Child psychotherapistocial Worker on DBT and has found this helpful.  [facility] these tools to recognize how she was feeling angry and choose response.  Reflected on DBT tools to reach out for support if she is feeling overwhelmed.

## 2017-12-23 NOTE — Plan of Care (Signed)
  Problem: Activity: Goal: Interest or engagement in activities will improve Outcome: Progressing   Problem: Coping: Goal: Ability to verbalize frustrations and anger appropriately will improve Outcome: Progressing   D: Pt alert and oriented on the unit. Pt engaging with RN staff and other pts. Pt denies SI/HI, A/VH. Pt is pleasant and cooperative. A: Education, support and encouragement provided, q15 minute safety checks remain in effect. Medications administered per MD orders. R: No reactions/side effects to medicine noted. Pt denies any concerns at this time, and verbally contracts for safety. Pt ambulating on the unit with no issues. Pt remains safe on and off the unit.  

## 2017-12-23 NOTE — Tx Team (Signed)
Interdisciplinary Treatment and Diagnostic Plan Update  12/23/2017 Time of Session: [id number] Misty NissenKristal [name] MRN: [id number]  Principal Diagnosis: Bipolar 1 disorder, depressed, severe (HCC)  Secondary Diagnoses: Principal Problem:   Bipolar 1 disorder, depressed, severe (HCC) Active Problems:   H/O ASTHMA   Borderline personality disorder (HCC)   Posttraumatic stress disorder   Current Medications:  Current Facility-Administered Medications  Medication Dose Route Frequency Provider Last Rate Last Dose  . acetaminophen (TYLENOL) tablet 650 mg  650 mg Oral Q6H PRN Kerry HoughSimon, Spencer E, PA-C   650 mg at 12/22/17 1620  . alum & mag hydroxide-simeth (MAALOX/MYLANTA) 200-200-20 MG/5ML suspension 30 mL  30 mL Oral Q4H PRN Donell SievertSimon, Spencer E, PA-C      . buPROPion (WELLBUTRIN XL) 24 hr tablet 450 mg  450 mg Oral Daily Cobos, Rockey SituFernando A, MD   450 mg at 12/23/17 0817  . clotrimazole (LOTRIMIN) 1 % cream   Topical BID Maryagnes AmosStarkes-Perry, Takia S, FNP      . dicyclomine (BENTYL) capsule 10-20 mg  10-20 mg Oral TID AC Simon, Spencer E, PA-C   10 mg at 12/23/17 1305  . divalproex (DEPAKOTE) DR tablet 500 mg  500 mg Oral Q12H Donell SievertSimon, Spencer E, PA-C   500 mg at 12/23/17 0816  . divalproex (DEPAKOTE) DR tablet 500 mg  500 mg Oral QHS Maryagnes AmosStarkes-Perry, Takia S, FNP   500 mg at 12/22/17 2114  . feeding supplement (ENSURE ENLIVE) (ENSURE ENLIVE) liquid 237 mL  237 mL Oral BID BM & HS PRN Maryagnes AmosStarkes-Perry, Takia S, FNP   237 mL at 12/18/17 1029  . ferrous sulfate tablet 325 mg  325 mg Oral Q breakfast Kerry HoughSimon, Spencer E, PA-C   325 mg at 12/23/17 [id number]  . haloperidol (HALDOL) tablet 5 mg  5 mg Oral TID Donell SievertSimon, Spencer E, PA-C   5 mg at 12/23/17 1305  . hydrOXYzine (ATARAX/VISTARIL) tablet 50 mg  50 mg Oral Q6H PRN Kerry HoughSimon, Spencer E, PA-C   50 mg at 12/22/17 2113  . Ipratropium-Albuterol (COMBIVENT) respimat 1 puff  1 puff Inhalation Q6H Simon, Spencer E, PA-C   1 puff at 12/22/17 1929  . levothyroxine (SYNTHROID, LEVOTHROID) tablet  25 mcg  25 mcg Oral Q0600 Kerry HoughSimon, Spencer E, PA-C   25 mcg at 12/23/17 0630  . lisinopril (PRINIVIL,ZESTRIL) tablet 40 mg  40 mg Oral Daily Antonieta Pertlary, Greg Lawson, MD   40 mg at 12/23/17 0816  . loratadine (CLARITIN) tablet 10 mg  10 mg Oral Daily Kerry HoughSimon, Spencer E, PA-C   10 mg at 12/23/17 0816  . magnesium hydroxide (MILK OF MAGNESIA) suspension 30 mL  30 mL Oral Daily PRN Kerry HoughSimon, Spencer E, PA-C      . mometasone-formoterol (DULERA) 200-5 MCG/ACT inhaler 2 puff  2 puff Inhalation BID Kerry HoughSimon, Spencer E, PA-C   2 puff at 12/22/17 1930  . montelukast (SINGULAIR) tablet 10 mg  10 mg Oral QHS Kerry HoughSimon, Spencer E, PA-C   10 mg at 12/22/17 2113  . neomycin-polymyxin-hydrocortisone (CORTISPORIN) OTIC (EAR) suspension 4 drop  4 drop Both EARS Q6H Simon, Spencer E, PA-C   4 drop at 12/23/17 1305  . pantoprazole (PROTONIX) EC tablet 40 mg  40 mg Oral Daily Kerry HoughSimon, Spencer E, PA-C   40 mg at 12/23/17 [id number]  . prazosin (MINIPRESS) capsule 2 mg  2 mg Oral QHS Kerry HoughSimon, Spencer E, PA-C   2 mg at 12/22/17 2113  . traZODone (DESYREL) tablet 100 mg  100 mg Oral QHS PRN Cobos, Rockey SituFernando A, MD  100 mg at 12/22/17 2113  . venlafaxine XR (EFFEXOR-XR) 24 hr capsule 37.5 mg  37.5 mg Oral Q breakfast Cobos, Rockey Situ, MD   37.5 mg at 12/23/17 7829   PTA Medications: Facility-Administered Medications Prior to Admission  Medication Dose Route Frequency Provider Last Rate Last Dose  . Mepolizumab SOLR 100 mg  100 mg Subcutaneous Q28 days Jessica Priest, MD   100 mg at 05/26/17 1611   Medications Prior to Admission  Medication Sig Dispense Refill Last Dose  . ferrous sulfate 325 (65 FE) MG tablet Take by mouth.     . Paliperidone Palmitate ER (INVEGA SUSTENNA) 39 MG/0.25ML SUSY Inject into the muscle.     . prazosin (MINIPRESS) 1 MG capsule take 3 capsule by oral route once at night     . alosetron (LOTRONEX) 0.5 MG tablet Take 0.5 mg by mouth 2 (two) times daily.  1 Taking  . buPROPion (WELLBUTRIN XL) 300 MG 24 hr tablet Take 1  tablet (300 mg total) by mouth daily. For mood control 30 tablet 0 Taking  . divalproex (DEPAKOTE ER) 250 MG 24 hr tablet Take 1 tablet (250 mg total) by mouth daily. For mood control 30 tablet 0 Taking  . divalproex (DEPAKOTE ER) 500 MG 24 hr tablet Take 1 tablet (500 mg total) by mouth 2 (two) times daily. 60 tablet 0 Taking  . EPINEPHrine 0.3 mg/0.3 mL IJ SOAJ injection Use as directed for life threatening allergic reaction 2 Device 3 Taking  . Erenumab-aooe (AIMOVIG) 140 MG/ML SOAJ Inject into the skin.   Taking  . fexofenadine (ALLEGRA) 180 MG tablet TAKE ONE TABLET BY MOUTH ONCE DAILY 30 tablet 0   . fluticasone (FLOVENT HFA) 110 MCG/ACT inhaler Inhale 3 puffs 3 times daily only during asthma flare ups. Rinse mouth after use. 1 Inhaler 0   . haloperidol (HALDOL) 5 MG tablet Take 5 mg by mouth 3 (three) times daily.  5 Taking  . hydrOXYzine (ATARAX/VISTARIL) 25 MG tablet Take 1 tablet (25 mg total) by mouth 3 (three) times daily as needed for anxiety. 30 tablet 0 Taking  . Ixekizumab (TALTZ) 80 MG/ML SOAJ Inject into the skin.   Taking  . levothyroxine (SYNTHROID, LEVOTHROID) 25 MCG tablet Take 1 tablet (25 mcg total) by mouth daily before breakfast. 30 tablet 0 Taking  . lisinopril (PRINIVIL,ZESTRIL) 10 MG tablet Take 1 tablet (10 mg total) by mouth daily. 90 tablet 3   . loratadine (CLARITIN) 10 MG tablet Take 1 tablet (10 mg total) by mouth daily. 30 tablet 0 Taking  . mometasone-formoterol (DULERA) 200-5 MCG/ACT AERO Inhale two puffs twice a day to prevent cough or wheeze. Rinse, gargle, and spit after use. 13 g 0   . montelukast (SINGULAIR) 10 MG tablet Take 10 mg by mouth at bedtime.   Taking  . paliperidone (INVEGA SUSTENNA) 234 MG/1.5ML SUSP injection Inject 234 mg into the muscle once for 1 dose. Next injection due 05-05-17 1.5 mL 0 Taking  . prazosin (MINIPRESS) 2 MG capsule Take 1 capsule (2 mg total) by mouth at bedtime. (Patient taking differently: Take 1 mg by mouth at bedtime. ) 30  capsule 0 Taking  . ranitidine (ZANTAC) 300 MG tablet TAKE ONE TABLET BY MOUTH AT BEDTIME 30 tablet 5 Taking  . topiramate (TOPAMAX) 6 mg/mL SUSP Take by mouth.   Taking  . traZODone (DESYREL) 100 MG tablet Take 100 mg by mouth at bedtime.  6   . traZODone (DESYREL) 50 MG tablet Take  1 tablet (50 mg total) by mouth at bedtime as needed for sleep. (Patient taking differently: Take 100 mg by mouth at bedtime as needed for sleep. ) 30 tablet 0 Taking    Patient Stressors: Marital or family conflict  Patient Strengths: Ability for insight Average or above average intelligence Capable of independent living General fund of knowledge Motivation for treatment/growth Supportive family/friends  Treatment Modalities: Medication Management, Group therapy, Case management,  1 to 1 session with clinician, Psychoeducation, Recreational therapy.   Physician Treatment Plan for Primary Diagnosis: Bipolar 1 disorder, depressed, severe (HCC) Long Term Goal(s): Improvement in symptoms so as ready for discharge Improvement in symptoms so as ready for discharge   Short Term Goals: Ability to identify changes in lifestyle to reduce recurrence of condition will improve Ability to verbalize feelings will improve Ability to disclose and discuss suicidal ideas Ability to demonstrate self-control will improve Ability to identify and develop effective coping behaviors will improve Ability to maintain clinical measurements within normal limits will improve Ability to identify changes in lifestyle to reduce recurrence of condition will improve Ability to verbalize feelings will improve Ability to disclose and discuss suicidal ideas Ability to demonstrate self-control will improve Ability to identify and develop effective coping behaviors will improve Ability to maintain clinical measurements within normal limits will improve  Medication Management: Evaluate patient's response, side effects, and tolerance of  medication regimen.  Therapeutic Interventions: 1 to 1 sessions, Unit Group sessions and Medication administration.  Evaluation of Outcomes: Adequate for Discharge  Physician Treatment Plan for Secondary Diagnosis: Principal Problem:   Bipolar 1 disorder, depressed, severe (HCC) Active Problems:   H/O ASTHMA   Borderline personality disorder (HCC)   Posttraumatic stress disorder  Long Term Goal(s): Improvement in symptoms so as ready for discharge Improvement in symptoms so as ready for discharge   Short Term Goals: Ability to identify changes in lifestyle to reduce recurrence of condition will improve Ability to verbalize feelings will improve Ability to disclose and discuss suicidal ideas Ability to demonstrate self-control will improve Ability to identify and develop effective coping behaviors will improve Ability to maintain clinical measurements within normal limits will improve Ability to identify changes in lifestyle to reduce recurrence of condition will improve Ability to verbalize feelings will improve Ability to disclose and discuss suicidal ideas Ability to demonstrate self-control will improve Ability to identify and develop effective coping behaviors will improve Ability to maintain clinical measurements within normal limits will improve     Medication Management: Evaluate patient's response, side effects, and tolerance of medication regimen.  Therapeutic Interventions: 1 to 1 sessions, Unit Group sessions and Medication administration.  Evaluation of Outcomes: Adequate for Discharge   RN Treatment Plan for Primary Diagnosis: Bipolar 1 disorder, depressed, severe (HCC) Long Term Goal(s): Knowledge of disease and therapeutic regimen to maintain health will improve  Short Term Goals: Ability to verbalize frustration and anger appropriately will improve, Ability to demonstrate self-control, Ability to participate in decision making will improve and Ability to  disclose and discuss suicidal ideas  Medication Management: RN will administer medications as ordered by provider, will assess and evaluate patient's response and provide education to patient for prescribed medication. RN will report any adverse and/or side effects to prescribing provider.  Therapeutic Interventions: 1 on 1 counseling sessions, Psychoeducation, Medication administration, Evaluate responses to treatment, Monitor vital signs and CBGs as ordered, Perform/monitor CIWA, COWS, AIMS and Fall Risk screenings as ordered, Perform wound care treatments as ordered.  Evaluation of Outcomes: Adequate  for Discharge   LCSW Treatment Plan for Primary Diagnosis: Bipolar 1 disorder, depressed, severe (HCC) Long Term Goal(s): Safe transition to appropriate next level of care at discharge, Engage patient in therapeutic group addressing interpersonal concerns.  Short Term Goals: Engage patient in aftercare planning with referrals and resources, Increase social support, Increase emotional regulation and Increase skills for wellness and recovery  Therapeutic Interventions: Assess for all discharge needs, 1 to 1 time with Social worker, Explore available resources and support systems, Assess for adequacy in community support network, Educate family and significant other(s) on suicide prevention, Complete Psychosocial Assessment, Interpersonal group therapy.  Evaluation of Outcomes: Adequate for Discharge   Progress in Treatment: Attending groups: Yes Participating in groups: Yes. Taking medication as prescribed: Yes. Toleration medication: Yes. Family/Significant other contact made: SPE completed with pt; pt declined to consent to collateral contact at this time.  Patient understands diagnosis: Yes. Discussing patient identified problems/goals with staff: Yes. Medical problems stabilized or resolved: Yes. Denies suicidal/homicidal ideation: Yes. Issues/concerns per patient self-inventory:  No. Other: n/a   New problem(s) identified: No, Describe:  n/a  New Short Term/Long Term Goal(s): medication management for mood stabilization; elimination of SI thoughts; development of comprehensive mental wellness/sobriety plan.   Patient Goals:  "To work on my suicidal thoughts and try to feel better."   Discharge Plan or Barriers: Pt plans to return home with family. She is connected with [facility] in [place], [place] and sees Dr. Scotty Court for primary care in [place], [place]. She also has a [facility] who is assisting her with searching for housing--CSW assessing and will contact Care Coordinator on Tuesday when offices open. [facility] pamphlet, Mobile Crisis information information provided to patient for additional community support and resources.   Reason for Continuation of Hospitalization:   Estimated Length of Stay: Saturday, 12/24/17  Attendees: Patient: Misty [name]  12/23/2017 1:08 PM  Physician: Dr. Altamese Keuka Park MD 12/23/2017 1:08 PM  Nursing: Moshe Salisbury; [place] RN 12/23/2017 1:08 PM  RN Care Manager:x 12/23/2017 1:08 PM  Social Worker: Corrie Mckusick LCSW; Baldo Daub, LCSWA 12/23/2017 1:08 PM  Recreational Therapist: x 12/23/2017 1:08 PM  Other: Armandina Stammer NP; Hillery Jacks NP 12/23/2017 1:08 PM  Other:  12/23/2017 1:08 PM  Other: 12/23/2017 1:08 PM    Scribe for Treatment Team: Maeola Sarah, LCSWA 12/23/2017 1:08 PM

## 2017-12-24 MED ORDER — LOPERAMIDE HCL 2 MG PO CAPS
ORAL_CAPSULE | ORAL | Status: AC
  Filled 2017-12-24: qty 1

## 2017-12-24 MED ORDER — DICYCLOMINE HCL 10 MG PO CAPS
10.0000 mg | ORAL_CAPSULE | Freq: Three times a day (TID) | ORAL | Status: DC | PRN
  Administered 2017-12-24: 10 mg via ORAL

## 2017-12-24 MED ORDER — LOPERAMIDE HCL 2 MG PO CAPS
2.0000 mg | ORAL_CAPSULE | ORAL | Status: DC | PRN
  Administered 2017-12-24 (×2): 2 mg via ORAL
  Filled 2017-12-24: qty 1

## 2017-12-24 MED ORDER — TRAZODONE HCL 50 MG PO TABS
50.0000 mg | ORAL_TABLET | Freq: Every evening | ORAL | Status: DC | PRN
  Administered 2017-12-24 – 2017-12-25 (×2): 50 mg via ORAL
  Filled 2017-12-24 (×2): qty 1

## 2017-12-24 MED ORDER — SERTRALINE HCL 25 MG PO TABS
25.0000 mg | ORAL_TABLET | Freq: Every day | ORAL | Status: DC
  Administered 2017-12-24 – 2017-12-25 (×2): 25 mg via ORAL
  Filled 2017-12-24 (×4): qty 1

## 2017-12-24 NOTE — Progress Notes (Signed)
Patient ID: Misty [name], female   DOB: [birth year], 22 y.o.   MRN: [id number]   [facility]  (Nursing/MHT/Case Management/Adjunct)  Date:  12/24/2017  Time:  1315   Type of Therapy:  Nurse Education  Participation Level:  Minimal  Participation Quality:  Lacking  Affect:  Appropriate  Cognitive:  Alert and Oriented  Insight:  Improving  Engagement in Group:  Improving  Modes of Intervention:  Activity, Discussion, Education and Support  Summary of Progress/Problems:   [name], [name] 12/24/2017, 1:22 PM

## 2017-12-24 NOTE — [facility] Group Notes (Signed)
Pt was invited, but did not attend goals/ orientation group
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
## 2017-12-24 NOTE — BHH Group Notes (Signed)
LCSW Group Therapy Note  12/24/2017    10:45-11:30am   Type of Therapy and Topic:  Group Therapy: Early Messages Received About Anger  Participation Level:  Minimal   Description of Group:   In this group, patients shared and discussed the early messages received in their lives about anger through parental or other adult modeling, teaching, repression, punishment, violence, and more.  Participants identified how those childhood lessons influence even now how they usually or often react when angered.  The group discussed that anger is a secondary emotion and what may be the underlying emotional themes that come out through anger outbursts or that are ignored through anger suppression.  Finally, as a group there was a conversation about the workbook's quote that "There is nothing wrong with anger; it is just a sign something needs to change."     Therapeutic Goals: 1. Patients will identify one or more childhood message about anger that they received and how it was taught to them. 2. Patients will discuss how these childhood experiences have influenced and continue to influence their own expression or repression of anger even today. 3. Patients will explore possible primary emotions that tend to fuel their secondary emotion of anger. 4. Patients will learn that anger itself is normal and cannot be eliminated, and that healthier coping skills can assist with resolving conflict rather than worsening situations.  Summary of Patient Progress:  The patient shared that her childhood lessons about anger were that anger is not okay.  As a result, she hides all her feelings and will not let any family members or anyone in her life know what she is feeling at any time.  This has been very detrimental to her mental health.  Therapeutic Modalities:   Cognitive Behavioral Therapy Motivation Interviewing  Lynnell ChadMareida J Grossman-Orr  .

## 2017-12-24 NOTE — Progress Notes (Signed)
D: Pt was in her room upon initial approach.  Pt presents with depressed affect and mood.  Her goal is to "stay safe" by using positive coping skills.  She reports the best part of her day was "seeing my pastor."  She is tearful at times and smiles on occasion.  Pt denies HI, denies hallucinations, denies pain.  She endorses passive SI without plan and verbally contracts for safety.  Pt has been visible in milieu interacting with peers and staff appropriately.  Pt attended evening group.    A: Met with pt 1:1 multiple times.  Actively listened to pt and provided support and encouragement.  Medications administered per order.  PRN medication administered for sleep.  Q15 minute safety checks maintained.  R: Pt is safe on the unit.  Pt is compliant with medications.  Pt verbally contracts for safety and reports she will notify staff of needs and concerns.  Will continue to monitor and assess.

## 2017-12-24 NOTE — Plan of Care (Signed)
  Problem: Education: Goal: Mental status will improve Outcome: Progressing   

## 2017-12-24 NOTE — B[facility] Group Notes (Signed)
Adult Psychoeducational Group Note  Date:  12/24/2017 Time:  4:41 AM  Group Topic/Focus:  Wrap-Up Group:   The focus of this group is to help patients review their daily goal of treatment and discuss progress on daily workbooks.  Participation Level:  Active  Participation Quality:  Appropriate and Attentive  Affect:  Appropriate  Cognitive:  Alert and Appropriate  Insight: Appropriate and Good  Engagement in Group:  Engaged  Modes of Intervention:  Discussion and Education  Additional Comments:  Pt attended and participated in wrap up group this evening. Pt rated their day a 4/10, due to them having some suicidal thoughts today. Pt goal is to not think about suicide and to open up more with the Dr and RN's about their thoughts.   Misty NettersOctavia A Ekin Pilar 12/24/2017, 4:41 AM

## 2017-12-24 NOTE — Progress Notes (Signed)
D Pt is observed OOB UAL on the 400 hall today. She tolerates this fairly well. Her affect remains quite depressed, flat and blunted. She is seen often sitting in the 400 hall dayroom amongst her peers or lying on her bed in her room. She spends much of the day working on her coping skills and processing her feelings in her journal.   '     A SHe completed her daily assessment and on this she wrote she has experienced SI today but she verbally contracts with this writer to not hurt herself today. She shared her writings in her journal with this Clinical research associatewriter- that she feels like her family does not love her and would not miss her if she dies. Writer processed with her healthy ways to ground herslef and she wrote positive affirmation list (cc placed in her shadow chart).     R Safety is in place and pt encouraged to not worry about dc. And to stay focused on the now.

## 2017-12-24 NOTE — Progress Notes (Signed)
Patient shared with the group that she felt "terrible" but would not go into greater detail. Her goal for tomorrow is to "stay safe".

## 2017-12-25 MED ORDER — PRENATAL MULTIVITAMIN CH
1.0000 | ORAL_TABLET | Freq: Every day | ORAL | Status: DC
  Administered 2017-12-25 – 2017-12-26 (×2): 1 via ORAL
  Filled 2017-12-25 (×3): qty 1

## 2017-12-25 MED ORDER — TRAZODONE HCL 50 MG PO TABS
50.0000 mg | ORAL_TABLET | Freq: Every evening | ORAL | Status: DC | PRN
  Administered 2017-12-25: 50 mg via ORAL
  Filled 2017-12-25: qty 1

## 2017-12-25 MED ORDER — SERTRALINE HCL 50 MG PO TABS
50.0000 mg | ORAL_TABLET | Freq: Every day | ORAL | Status: DC
  Administered 2017-12-26: 50 mg via ORAL
  Filled 2017-12-25 (×3): qty 1

## 2017-12-25 NOTE — Progress Notes (Signed)
Adult Psychoeducational Group Note  Date:  12/25/2017 Time:  9:34 PM  Group Topic/Focus:  Wrap-Up Group:   The focus of this group is to help patients review their daily goal of treatment and discuss progress on daily workbooks.  Participation Level:  Active  Participation Quality:  Appropriate  Affect:  Appropriate  Cognitive:  Appropriate  Insight: Appropriate  Engagement in Group:  Engaged  Modes of Intervention:  Discussion  Additional Comments:  Patient attended group and said that her day was a 3. The positive portion of her day was when she spoke to her mother on the phone.   Pria Klosinski W Alta Goding 12/25/2017, 9:34 PM

## 2017-12-25 NOTE — Progress Notes (Signed)
D: Patient exhibits attention-seeking behavior.  She will seek out staff member and state, "I feel like hurting myself."  Patient has been asked to stay near the nurse's station or in the day room.  She is observed coloring, and she colored a picture for nursing staff.  She presents with fixed smile; she rates her depression, hopelessness and anxiety as a 10.  She like to stay up at the nurse's station and talks with MHTs frequently.  She has passive suicidal ideation with no specific plan.  She is not focused on any discharge plan.  A: Continue to monitor medication management and MD orders.  Safety checks completed every 15 minutes per protocol.  Offer support and encouragement as needed.  R: Patient is receptive to staff; she exhibits attention seeking behavior.

## 2017-12-25 NOTE — Plan of Care (Signed)
  Problem: Activity: Goal: Sleeping patterns will improve Outcome: Progressing Note:  Slept 6.75 hours last night according to flowsheet.    

## 2017-12-25 NOTE — Progress Notes (Signed)
D: Pt was in hallway upon initial approach.  Pt presents with anxious affect and mood.  Pt smiles frequently tonight and affect is brighter in comparison to last night.  Her goal is "same as yesterday" which was "staying safe."  She reports the best part of her day was "coloring."  Pt denies SI/HI, denies hallucinations, denies pain.  Pt has been visible in milieu interacting with peers and staff appropriately.  Pt attended evening group.  She reported to staff that she has "racing thoughts at night."  Pt reports she thinks this is because it is dark in her room.  Emotional support was provided and staff turned on the nightlight in her room.    A: Met with pt 1:1.  Actively listened to pt and provided support and encouragement. Medications administered per order.  PRN medication administered for anxiety and sleep.  Q15 minute safety checks maintained.  R: Pt is safe on the unit.  Pt is compliant with medications.  Pt verbally contracts for safety and reports she will inform staff of needs and concerns.  Will continue to monitor and assess.

## 2017-12-25 NOTE — Progress Notes (Signed)
[facility]  12/25/2017 8:36 AM Misty [name]  MRN:  [id number] Subjective:   Patient is seen she is generally visible on the unit not as isolative she does however states she does not get along with her mother and that is why she resists discharge and the thought of discharge makes her more depressed however she understands she cannot stay hospitalized indefinitely.  She has suicidal thoughts without plans or intent here can contract here.  Discussed her medication she is agreeable to vitamin augmentation as well as escalation of sertraline Principal Problem: Bipolar 1 disorder, depressed, severe (HCC) Diagnosis:   Patient Active Problem List   Diagnosis Date Noted  . Bipolar 1 disorder, depressed, severe (HCC) [F31.4] 12/16/2017  . Bipolar 1 disorder (HCC) [F31.9] 04/15/2017  . Swelling of both lower extremities [M79.89] 12/14/2016  . Lithium toxicity [T56.891A] 04/05/2016  . Iron deficiency anemia [D50.9] 12/11/2015  . Fatty liver [K76.0] 10/10/2015  . Iron deficiency [E61.1] 10/10/2015  . Bipolar I disorder (HCC) [F31.9] 10/10/2015  . Palpitations [R00.2] 07/21/2015  . Morbid obesity (HCC) [E66.01] 04/03/2015  . Borderline personality disorder (HCC) [F60.3] 03/14/2015  . Allergic rhinitis [J30.9] 10/08/2014  . GERD (gastroesophageal reflux disease) [K21.9] 10/08/2014  . H/O ASTHMA [J45.909] 10/08/2014  . INTERMITTENT URTICARIA [Z87.2] 10/08/2014  . Posttraumatic stress disorder [F43.10] 09/04/2014  . Sinus tachycardia [R00.0] 08/07/2014  . Hypertension [I10] 05/16/2013  . Metabolic syndrome [E88.81] 09/04/2012  . GAD (generalized anxiety disorder) [F41.1] 07/19/2012  . ODD (oppositional defiant disorder) [F91.3] 07/19/2012  . Obstructive sleep apnea [G47.33] 07/19/2012  . MDD (major depressive disorder), recurrent episode, moderate (HCC) [F33.1] 03/01/2012   Total Time spent with patient: 20 minutes  Past Psychiatric History: Extensive history of PTSD and borderline  personality disorder recurrent severe depression  Past Medical History:  Past Medical History:  Diagnosis Date  . Allergic rhinitis   . Anxiety   . Depression   . GERD (gastroesophageal reflux disease)   . HTN (hypertension) 07/13/12  . IBS (irritable bowel syndrome)   . Obesity   . OSA on CPAP   . Psoriasis     Past Surgical History:  Procedure Laterality Date  . ADENOIDECTOMY    . SINOSCOPY    . TONSILLECTOMY AND ADENOIDECTOMY  Age 22   Deviated septum corrected at same time   Family History:  Family History  Problem Relation Age of Onset  . Depression Mother   . Allergic rhinitis Mother   . Depression Maternal Grandmother   . Diabetes Maternal Grandmother   . Hypertension Maternal Grandmother   . Bipolar disorder Father   . Drug abuse Father   . Hypertension Father     Social History:  Social History   Substance and Sexual Activity  Alcohol Use Yes   Comment: socially     Social History   Substance and Sexual Activity  Drug Use No    Social History   Socioeconomic History  . Marital status: Single    Spouse name: Not on file  . Number of children: Not on file  . Years of education: Not on file  . Highest education level: Not on file  Occupational History  . 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 Financial resource strain: Not on file  . Food insecurity:    Worry: Not on file    Inability: Not on file  . Transportation needs:    Medical: Not  on file    Non-medical: Not on file  Tobacco Use  . Smoking status: Never Smoker  . Smokeless tobacco: Never Used  Substance and Sexual Activity  . Alcohol use: Yes    Comment: socially  . Drug use: No  . Sexual activity: Not Currently  Lifestyle  . Physical activity:    Days per week: Not on file    Minutes per session: Not on file  . Stress: Not on file  Relationships  . Social connections:    Talks on phone: Not on file    Gets together:  Not on file    Attends religious service: Not on file    Active member of club or organization: Not on file    Attends meetings of clubs or organizations: Not on file    Relationship status: Not on file  Other Topics Concern  . Not on file  Social History Narrative  . Not on file   Additional Social History:    Pain Medications: see MAR Prescriptions: see MAR Over the Counter: see MAR History of alcohol / drug use?: No history of alcohol / drug abuse Longest period of sobriety (when/how long): NA                    Sleep: Fair  Appetite:  Good  Current Medications: Current Facility-Administered Medications  Medication Dose Route Frequency Provider Last Rate Last Dose  . acetaminophen (TYLENOL) tablet 650 mg  650 mg Oral Q6H PRN Kerry Hough, PA-C   650 mg at 12/22/17 1620  . alum & mag hydroxide-simeth (MAALOX/MYLANTA) 200-200-20 MG/5ML suspension 30 mL  30 mL Oral Q4H PRN Donell Sievert E, PA-C      . buPROPion (WELLBUTRIN XL) 24 hr tablet 450 mg  450 mg Oral Daily Cobos, Rockey Situ, MD   450 mg at 12/25/17 0823  . clotrimazole (LOTRIMIN) 1 % cream   Topical BID Maryagnes Amos, FNP      . dicyclomine (BENTYL) capsule 10 mg  10 mg Oral TID WC PRN Cobos, Rockey Situ, MD   10 mg at 12/24/17 1126  . divalproex (DEPAKOTE) DR tablet 500 mg  500 mg Oral Q12H Donell Sievert E, PA-C   500 mg at 12/25/17 1610  . divalproex (DEPAKOTE) DR tablet 500 mg  500 mg Oral QHS Maryagnes Amos, FNP   500 mg at 12/24/17 2105  . feeding supplement (ENSURE ENLIVE) (ENSURE ENLIVE) liquid 237 mL  237 mL Oral BID BM & HS PRN Maryagnes Amos, FNP   237 mL at 12/18/17 1029  . ferrous sulfate tablet 325 mg  325 mg Oral Q breakfast Kerry Hough, PA-C   325 mg at 12/25/17 9604  . haloperidol (HALDOL) tablet 5 mg  5 mg Oral TID Donell Sievert E, PA-C   5 mg at 12/25/17 5409  . hydrOXYzine (ATARAX/VISTARIL) tablet 50 mg  50 mg Oral Q6H PRN Kerry Hough, PA-C   50 mg at  12/24/17 1817  . Ipratropium-Albuterol (COMBIVENT) respimat 1 puff  1 puff Inhalation Q6H Simon, Spencer E, PA-C   1 puff at 12/25/17 0824  . levothyroxine (SYNTHROID, LEVOTHROID) tablet 25 mcg  25 mcg Oral Q0600 Kerry Hough, PA-C   25 mcg at 12/25/17 8119  . lisinopril (PRINIVIL,ZESTRIL) tablet 40 mg  40 mg Oral Daily Antonieta Pert, MD   40 mg at 12/25/17 1478  . loperamide (IMODIUM) capsule 2 mg  2 mg Oral PRN Cobos, Rockey Situ,  MD   2 mg at 12/24/17 1301  . loratadine (CLARITIN) tablet 10 mg  10 mg Oral Daily Kerry Hough, PA-C   10 mg at 12/25/17 1610  . magnesium hydroxide (MILK OF MAGNESIA) suspension 30 mL  30 mL Oral Daily PRN Kerry Hough, PA-C      . mometasone-formoterol (DULERA) 200-5 MCG/ACT inhaler 2 puff  2 puff Inhalation BID Kerry Hough, PA-C   2 puff at 12/25/17 0824  . montelukast (SINGULAIR) tablet 10 mg  10 mg Oral QHS Kerry Hough, PA-C   10 mg at 12/24/17 2105  . neomycin-polymyxin-hydrocortisone (CORTISPORIN) OTIC (EAR) suspension 4 drop  4 drop Both EARS Q6H Kerry Hough, PA-C   4 drop at 12/25/17 9604  . pantoprazole (PROTONIX) EC tablet 40 mg  40 mg Oral Daily Kerry Hough, PA-C   40 mg at 12/25/17 5409  . prazosin (MINIPRESS) capsule 2 mg  2 mg Oral QHS Kerry Hough, PA-C   2 mg at 12/24/17 2105  . prenatal multivitamin tablet 1 tablet  1 tablet Oral Q1200 Malvin Johns, MD      . Melene Muller ON 12/26/2017] sertraline (ZOLOFT) tablet 50 mg  50 mg Oral Daily Malvin Johns, MD      . traZODone (DESYREL) tablet 50 mg  50 mg Oral QHS PRN Cobos, Rockey Situ, MD   50 mg at 12/24/17 2105    Lab Results: No results found for this or any previous visit (from the past 48 hour(s)).  Blood Alcohol level:  Lab Results  Component Value Date   [facility]  06/24/2008    <5        LOWEST DETECTABLE LIMIT FOR SERUM ALCOHOL IS 5 mg/dL FOR MEDICAL PURPOSES ONLY    Metabolic Disorder Labs: Lab Results  Component Value Date   HGBA1C 5.1 04/17/2017   MPG  99.67 04/17/2017   MPG 100 02/25/2008   No results found for: PROLACTIN Lab Results  Component Value Date   CHOL 155 04/17/2017   TRIG 135 04/17/2017   HDL 46 04/17/2017   CHOLHDL 3.4 04/17/2017   VLDL 27 04/17/2017   LDLCALC 82 04/17/2017   LDLCALC 93 03/01/2012    Physical Findings: AIMS: Facial and Oral Movements Muscles of Facial Expression: None, normal Lips and Perioral Area: None, normal Jaw: None, normal Tongue: None, normal,Extremity Movements Upper (arms, wrists, hands, fingers): None, normal Lower (legs, knees, ankles, toes): None, normal, Trunk Movements Neck, shoulders, hips: None, normal, Overall Severity Severity of abnormal movements (highest score from questions above): None, normal Incapacitation due to abnormal movements: None, normal Patient's awareness of abnormal movements (rate only patient's report): No Awareness, Dental Status Current problems with teeth and/or dentures?: No Does patient usually wear dentures?: No  CIWA:  CIWA-Ar Total: 0 COWS:  COWS Total Score: 2  Musculoskeletal: Strength & Muscle Tone: within normal limits Gait & Station: normal Patient leans: N/A  Psychiatric Specialty Exam: Physical Exam  ROS  Blood pressure 121/84, pulse 92, temperature 97.7 F (36.5 C), temperature source Oral, resp. rate 16, height 5\' 4"  (1.626 m), weight (!) 181.4 kg.Body mass index is 68.66 kg/m.  General Appearance: Casual  Eye Contact:  Fair  Speech:  Clear and Coherent  Volume:  Decreased  Mood:  Dysphoric  Affect:  Congruent and Constricted  Thought Process:  Coherent  Orientation:  Full (Time, Place, and Person)  Thought Content:  Logical  Suicidal Thoughts:  Yes.  without intent/plan  Homicidal Thoughts:  No  Memory:  Immediate;   Good  Judgement:  Good  Insight:  Good  Psychomotor Activity:  Normal  Concentration:  Concentration: Good  Recall:  Good  Fund of Knowledge:  Good  Language:  Good  Akathisia:  Negative  Handed:  Right   AIMS (if indicated):     Assets:  Communication Skills  ADL's:  Intact  Cognition:  WNL  Sleep:  Number of Hours: 6.75    As above escalate sertraline Treatment Plan Summary: Daily contact with patient to assess and evaluate symptoms and progress in treatment and Medication management  Danitra Payano, MD 12/25/2017, 8:36 AM

## 2017-12-25 NOTE — [facility] Group Notes (Signed)
[facility] LCSW Group Therapy Note  12/25/2017  10:00-11:00AM  Type of Therapy and Topic:  Group Therapy:  Adding Supports Including Being Your Own Support  Participation Level:  Minimal   Description of Group:  Patients in this group were introduced to the concept that additional supports including self-support are an essential part of recovery.    Therapeutic Goals: 1)  demonstrate the importance of being a part of one's own support system 2)  discuss reasons people in one's life may eventually be unable to be continually supportive  3)  identify the patient's current support system and   4)  elicit commitments to add healthy supports and to become more conscious of being self-supportive   Summary of Patient Progress:  The patient expressed that she does not feel her Care Coordinator or ACTT Team are helping supports to her.  She stated she could not think of any other supports to help her.   Therapeutic Modalities:   Motivational Interviewing Activity  Misty [name]

## 2017-12-26 MED ORDER — LISINOPRIL 40 MG PO TABS: 40.0000 mg | ORAL_TABLET | Freq: Every day | ORAL | Status: DC

## 2017-12-26 MED ORDER — PRAZOSIN HCL 2 MG PO CAPS: 2.0000 mg | ORAL_CAPSULE | Freq: Every day | ORAL | 0 refills | Status: DC

## 2017-12-26 MED ORDER — PRENATAL MULTIVITAMIN CH: 1.0000 | ORAL_TABLET | Freq: Every day | ORAL | Status: DC

## 2017-12-26 MED ORDER — LEVOTHYROXINE SODIUM 25 MCG PO TABS: 25.0000 ug | ORAL_TABLET | Freq: Every day | ORAL | Status: DC

## 2017-12-26 MED ORDER — HYDROXYZINE HCL 50 MG PO TABS: 50.0000 mg | ORAL_TABLET | Freq: Four times a day (QID) | ORAL | 0 refills | Status: DC | PRN

## 2017-12-26 MED ORDER — SERTRALINE HCL 50 MG PO TABS: 50.0000 mg | ORAL_TABLET | Freq: Every day | ORAL | 0 refills | Status: DC

## 2017-12-26 MED ORDER — DIVALPROEX SODIUM 500 MG PO DR TAB: 500.0000 mg | DELAYED_RELEASE_TABLET | Freq: Two times a day (BID) | ORAL | 0 refills | Status: DC

## 2017-12-26 MED ORDER — MONTELUKAST SODIUM 10 MG PO TABS: 10.0000 mg | ORAL_TABLET | Freq: Every day | ORAL | Status: DC

## 2017-12-26 MED ORDER — RANITIDINE HCL 300 MG PO TABS: 300.0000 mg | ORAL_TABLET | Freq: Every day | ORAL | 0 refills | Status: DC

## 2017-12-26 MED ORDER — MOMETASONE FURO-FORMOTEROL FUM 200-5 MCG/ACT IN AERO: 2.0000 | INHALATION_SPRAY | Freq: Two times a day (BID) | RESPIRATORY_TRACT | Status: DC

## 2017-12-26 MED ORDER — EPINEPHRINE 0.3 MG/0.3ML IJ SOAJ: INTRAMUSCULAR | 3 refills | Status: DC

## 2017-12-26 MED ORDER — HALOPERIDOL 5 MG PO TABS: 5.0000 mg | ORAL_TABLET | Freq: Three times a day (TID) | ORAL | 0 refills | Status: DC

## 2017-12-26 MED ORDER — FERROUS SULFATE 325 (65 FE) MG PO TABS: 325.0000 mg | ORAL_TABLET | Freq: Every day | ORAL | 0 refills | Status: DC

## 2017-12-26 MED ORDER — NEOMYCIN-POLYMYXIN-HC 3.5-10000-1 OT SUSP: 4.0000 [drp] | Freq: Four times a day (QID) | OTIC | 0 refills | Status: DC

## 2017-12-26 MED ORDER — DIVALPROEX SODIUM 500 MG PO DR TAB: 500.0000 mg | DELAYED_RELEASE_TABLET | Freq: Every day | ORAL | 0 refills | Status: DC

## 2017-12-26 MED ORDER — LORATADINE 10 MG PO TABS: 10.0000 mg | ORAL_TABLET | Freq: Every day | ORAL | 0 refills | Status: DC

## 2017-12-26 MED ORDER — IPRATROPIUM-ALBUTEROL 20-100 MCG/ACT IN AERS: 1.0000 | INHALATION_SPRAY | Freq: Four times a day (QID) | RESPIRATORY_TRACT | Status: DC

## 2017-12-26 MED ORDER — CLOTRIMAZOLE 1 % EX CREA: TOPICAL_CREAM | Freq: Two times a day (BID) | CUTANEOUS | 0 refills | Status: DC

## 2017-12-26 MED ORDER — TRAZODONE HCL 50 MG PO TABS: 50.0000 mg | ORAL_TABLET | Freq: Every evening | ORAL | 0 refills | Status: DC | PRN

## 2017-12-26 MED ORDER — ENSURE ENLIVE PO LIQD: 237.0000 mL | Freq: Two times a day (BID) | ORAL | 0 refills | Status: DC | PRN

## 2017-12-26 MED ORDER — BUPROPION HCL ER (XL) 450 MG PO TB24: 450.0000 mg | ORAL_TABLET | Freq: Every day | ORAL | 0 refills | Status: DC

## 2017-12-26 MED ORDER — FLUTICASONE PROPIONATE HFA 110 MCG/ACT IN AERO: INHALATION_SPRAY | RESPIRATORY_TRACT | 0 refills | Status: DC

## 2017-12-26 MED ORDER — DICYCLOMINE HCL 10 MG PO CAPS: 10.0000 mg | ORAL_CAPSULE | Freq: Three times a day (TID) | ORAL | 0 refills | Status: DC | PRN

## 2017-12-26 NOTE — Progress Notes (Signed)
Recreation Therapy Notes  Date: 11.18.19 Time: 0930 Location: [address] Dayroom  Group Topic: Stress Management  Goal Area(s) Addresses:  Patient will verbalize importance of using healthy stress management.  Patient will identify positive emotions associated with healthy stress management.   Intervention: Stress Management  Activity :  Guided Imagery.  LRT introduced the stress management technique of guided imagery.  LRT read a script that took patients on a journey through the forest.  Patients were to listen as the script was read to engage in the activity.  Education:  Stress Management, Discharge Planning.   Education Outcome: Acknowledges edcuation/In group clarification offered/Needs additional education  Clinical Observations/Feedback: Pt did not attend group.     Takari Duncombe,  LRT/CTRS         Tanise Russman A 12/26/2017 11:28 AM 

## 2017-12-26 NOTE — Progress Notes (Addendum)
D: Pt A & O X 3. Pt noted in dayroom on initial approach. Denies SI, HI, AVH and pain at this time. Reports she slept good last night with good appetite, normal energy and good concentration level. Rates her depression 5/10, hopelessness 5/10 and anxiety 10/10. Pt's goal today is to "talk to the doctor about me leaving". Pt d/c home as ordered. Cab voucher given for transportation home [facility]([place]). Picked up in front of facility by cab driver. A: D/C instructions reviewed with pt including prescriptions, medication samples and follow up appointment, compliance encouraged. All belongings from locker #22 given to pt at time of departure. Scheduled and PRN medications given with verbal education and effects monitored. Safety checks maintained without incident till time of d/c.  R: Pt receptive to care. Compliant with medications when offered. Denies adverse drug reactions when assessed. Verbalized understanding related to d/c instructions. Signed belonging sheet in agreement with items received from locker. Ambulatory with a steady gait. Appears to be in no physical distress at time of departure.
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
## 2017-12-26 NOTE — BHH Suicide Risk Assessment (Signed)
[facility] Discharge Suicide Risk Assessment   Principal Problem: Bipolar 1 disorder, depressed, severe (HCC) Discharge Diagnoses:  Patient Active Problem List   Diagnosis Date Noted  . Bipolar 1 disorder, depressed, severe (HCC) [F31.4] 12/16/2017  . Bipolar 1 disorder (HCC) [F31.9] 04/15/2017  . Swelling of both lower extremities [M79.89] 12/14/2016  . Lithium toxicity [T56.891A] 04/05/2016  . Iron deficiency anemia [D50.9] 12/11/2015  . Fatty liver [K76.0] 10/10/2015  . Iron deficiency [E61.1] 10/10/2015  . Bipolar I disorder (HCC) [F31.9] 10/10/2015  . Palpitations [R00.2] 07/21/2015  . Morbid obesity (HCC) [E66.01] 04/03/2015  . Borderline personality disorder (HCC) [F60.3] 03/14/2015  . Allergic rhinitis [J30.9] 10/08/2014  . GERD (gastroesophageal reflux disease) [K21.9] 10/08/2014  . H/O ASTHMA [J45.909] 10/08/2014  . INTERMITTENT URTICARIA [Z87.2] 10/08/2014  . Posttraumatic stress disorder [F43.10] 09/04/2014  . Sinus tachycardia [R00.0] 08/07/2014  . Hypertension [I10] 05/16/2013  . Metabolic syndrome [E88.81] 09/04/2012  . GAD (generalized anxiety disorder) [F41.1] 07/19/2012  . ODD (oppositional defiant disorder) [F91.3] 07/19/2012  . Obstructive sleep apnea [G47.33] 07/19/2012  . MDD (major depressive disorder), recurrent episode, moderate (HCC) [F33.1] 03/01/2012    Total Time spent with patient: 30 minutes  Musculoskeletal: Strength & Muscle Tone: within normal limits Gait & Station: normal Patient leans: N/A  Psychiatric Specialty Exam: ROS denies headache, no chest pain, no shortness of breath, no nausea or  vomiting,   Blood pressure 128/78, pulse (!) 101, temperature (!) 97.4 F (36.3 C), temperature source Oral, resp. rate 16, height 5\' 4"  (1.626 m), weight (!) 181.4 kg.Body mass index is 68.66 kg/m.  General Appearance: improving grooming   Eye Contact::  Good  Speech:  Normal Rate409  Volume:  Decreased  Mood:  states that today she is feeling " a lot  better"  Affect:  noticeably improved, more reactive/fuller in range  Thought Process:  Linear and Descriptions of Associations: Intact  Orientation:  Full (Time, Place, and Person)  Thought Content:  denies hallucinations, no delusions, not internally preoccupied   Suicidal Thoughts:  No currently denies suicidal or self injurious ideations  Homicidal Thoughts:  No denies any violent or homicidal ideations, and also  specifically denies any violent or homicidal ideations towards brother  Memory:  recent and remote grossly intact   Judgement:  Other:  improving   Insight:  Fair/improving   Psychomotor Activity:  improving, more visible on unit/ day room  Concentration:  Good  Recall:  Good  Fund of Knowledge:Good  Language: Good  Akathisia:  Negative  Handed:  Right  AIMS (if indicated):     Assets:  Communication Skills Desire for Improvement Resilience  Sleep:  Number of Hours: 6.25  Cognition: WNL  ADL's:  Intact   Mental Status Per Nursing Assessment::   On Admission:  Self-harm thoughts  Demographic Factors:  22 year old single female, lives with mother, unemployed   Loss Factors: Chronic mental illness , family stressors/strained relationship with younger brother  Historical Factors: History of Bipolar Disorder, PTSD, Borderline Personality Disorder diagnoses. Followed by [facility] . History of prior psychiatric admissions   Risk Reduction Factors:   Living with another person, especially a relative and Positive coping skills or problem solving skills  Continued Clinical Symptoms:  Today patient presents alert, attentive, well related, better groomed, and describes her mood has improved, and now feels noticeably better. Her affect is more reactive, brighter. No thought disorder, no suicidal or self injurious ideations at this time, no homicidal or violent ideations, no hallucinations, no delusions ,  not internally preoccupied . No disruptive or agitated behaviors on unit,  pleasant on approach. Currently denies medication side effects. We have reviewed side effects, including risk of movement disorders , TD on Haloperidol, and risk of teratogenicity on Valproic Acid . With her express consent I have spoken with her mother via phone, who corroborates patient seems improved and is in agreement with discharge planning . Of note, patient is followed by [facility] - she is on IM [place]Invega Sustenna, and scheduled monthly dose was 2 days ago. Have reviewed with patient - she states her [facility] team MD was planning to  discontinue this medication. She is also  currently responding well to Haldol PO, without side effects and no current psychotic symptoms. Based on above , TanzaniaInvega Sustenna not administered on unit .    Cognitive Features That Contribute To Risk:  No gross cognitive deficits noted upon discharge. Is alert , attentive, and oriented x 3   Suicide Risk:  Mild:  Suicidal ideation of limited frequency, intensity, duration, and specificity.  There are no identifiable plans, no associated intent, mild dysphoria and related symptoms, good self-control (both objective and subjective assessment), few other risk factors, and identifiable protective factors, including available and accessible social support.  Follow-up Information    Services, [facility] Follow up.   Why:  [facility] will continue to provide services after discharge.  Contact information: [address] [place] [place] [address] [phone]        Charlott RakesHodges, Francisco, MD. Go on 12/29/2017.   Specialty:  Family Medicine Why:  Please attend your hospital follow up appointment with Dr. Waynetta SandyBeth on Thursday, 12/29/17 at 10:40a.m. Contact information: [address] [place] [address] [phone]        [facility] Follow up.   Specialty:  Home Health Services Why:  Home Health will continue to provide services. If you have any questions or concerns please call Home Health  Services.  Contact information: [address] [place] [address] [phone]           Plan Of Care/Follow-up recommendations:  Activity:  as tolerated  Diet:  heart healthy  Tests:  NA Other:  See below  Patient is expressing readiness for discharge, plans to return home- family member will pick her up later today Follow up as above . Craige CottaFernando A Natasia Sanko, MD 12/26/2017, 11:13 AM

## 2017-12-26 NOTE — Progress Notes (Signed)
  [facility]charge [facility] :  Will you be returning to the same living situation after discharge:  Yes,  patient reports she is returning home with her family  At discharge, do you have transportation home?: Yes,  taxi voucher Do you have the ability to pay for your medications: Yes,  [facility] Medicaid, support from parents, SSDI   Release of information consent forms completed and in the chart;  Patient's signature needed at discharge.  Patient to Follow up at: Follow-up Information    [facility], [facility] Follow up.   Why:  [facility] will continue to provide services after discharge.  Contact information: [address] [place] [address] [phone]        Charlott RakesHodges, Francisco, MD. Go on 12/29/2017.   Specialty:  Family Medicine Why:  Please attend your hospital follow up appointment with Dr. Waynetta SandyBeth on Thursday, 12/29/17 at 10:40a.m. Contact information: [address] [place] [address] [phone]        [facility] Follow up.   Specialty:  Home Health Services Why:  Home Health will continue to provide services. If you have any questions or concerns please call Home Health Services.  Contact information: [address] [place] [address] [phone]           Next level of care provider has access to [facility] Link:yes  Safety Planning and Suicide Prevention discussed: Yes,  with the patient's mother   Have you used any form of tobacco in the last 30 days? (Cigarettes, Smokeless Tobacco, Cigars, and/or Pipes): No  Has patient been referred to the Quitline?: N/A patient is not a smoker  Patient has been referred for addiction treatment: N/A  Maeola SarahJolan E Shawneen Deetz, LCSWA 12/26/2017, 12:06 PM

## 2017-12-26 NOTE — Plan of Care (Signed)
  Problem: Coping: Goal: Ability to demonstrate self-control will improve Outcome: Progressing Note:  Pt has maintained control of her behavior tonight.    

## 2018-03-01 ENCOUNTER — Ambulatory Visit: Admit: 2018-03-01 | Discharge: 2018-03-02 | Payer: MEDICAID

## 2018-03-01 DIAGNOSIS — L400 Psoriasis vulgaris: Principal | ICD-10-CM

## 2018-03-01 DIAGNOSIS — Z79899 Other long term (current) drug therapy: Secondary | ICD-10-CM

## 2018-03-01 MED ORDER — CLOBETASOL 0.05 % SCALP SOLUTION
Freq: Two times a day (BID) | TOPICAL | 6 refills | 0.00000 days | Status: CP
Start: 2018-03-01 — End: 2019-03-01

## 2018-03-09 ENCOUNTER — Other Ambulatory Visit: Payer: Self-pay | Admitting: Allergy and Immunology

## 2018-03-09 MED ORDER — COSENTYX PEN 300 MG/2 PENS (150 MG/ML) SUBCUTANEOUS
SUBCUTANEOUS | 2 refills | 0.00000 days | Status: CP
Start: 2018-03-09 — End: ?
  Filled 2018-03-16: qty 8, 28d supply, fill #0

## 2018-03-09 NOTE — Unmapped (Signed)
Discussed biopsy result with patient over the phone. Biopsy with psoriasis, no evidence of fungal infection. Psoriasis recalcitrant to multiple treatments as per last clinic note. Will plan to switch to Cosentyx as discussed during the visit. Will send in prescription to [facility]. Discussed potential side effects, no history of IBD. Advised to continue with topicals as discussed during the visit. Advised to stop Altamease Oiler once she gets the new prescription for the Cosentyx. She has followup with Dr. Linton Rump on 3/25 for the same. If not responsive, might consider Remicade (weight based dosing).     Final Diagnosis         03/01/2018                                                         Scalp, punch -Psoriasis.       Dx:___psoriasis________________       Tracking: N.

## 2018-03-09 NOTE — Unmapped (Signed)
Per test claim for Cosentyx at the [facility], patient needs Medication Assistance Program for Prior Authorization.

## 2018-03-10 MED ORDER — EMPTY CONTAINER
2 refills | 0 days
Start: 2018-03-10 — End: ?

## 2018-03-10 NOTE — Unmapped (Signed)
[facility] Specialty Medication Referral: PA Approved      Medication (Brand/Generic): Cosentyx    Final Test Claim completed with resulted information below:    Patient ABLE to fill at [facility]  Insurance Company:  [name] Tracks  Anticipated Copay: $3  Is anticipated copay with a copay card or grant? No    Does patient's insurance plan only allow a 15 day supply for the first 6 fills in the Split Fill Program? No  If yes, inform patient they can request to dis-enroll from the [facility] by calling the patient help desk at N/A.      If the copay is under the $25 defined limit, per policy there will be no further investigation of need for financial assistance at this time unless patient requests. This referral has been communicated to the provider and handed off to the [facility] team for further processing and filling of prescribed medication.   ______________________________________________________________________  Please utilize this referral for viewing purposes as it will serve as the central location for all relevant documentation and updates.

## 2018-03-10 NOTE — Unmapped (Signed)
[facility] Specialty Med Referral: PA Approved   This referral has been APPROVED with the details below:   ??   Medication (Brand/Generic): COSENTYX   Insurance/Contact: [place] Tracks   Authorization ID/Case#: [id number]   Coverage Dates: 03/09/2018 through 03/04/2019   ??   Final test claim:   Dispensing Specialty Pharmacy: [facility]   Anticipated Co-Pay per [facility] Tracks Portal: $3   ______________________________________________________________________     This referral has been handed off to the [facility] for further processing and filling of the prescribed medication.   ______________________________________________________________________

## 2018-03-13 NOTE — Unmapped (Signed)
[facility]   Patient Onboarding/Medication Counseling    Ms.Bogdon is a 23 y.o. female with psoriasis who I am counseling today on initiation of therapy.    Medication: Cosentyx, sharps container    Verified patient's date of birth / HIPAA.      Education Provided: ?    Dose/Administration discussed: Inject the contents of 2 pens (300 mg) under the skin each week for 5 weeks, then 2 pens every 4 weeks thereafter. This medication should be taken  without regard to food.  Stressed the importance of taking medication as prescribed and to contact provider if that changes at any time.  Discussed missed dose instructions.    Storage requirements: this medicine should be stored in the refrigerator.     Side effects / precautions discussed: Discussed common side effects, including injection site reaction, increased risk of infection, less common GI upset. If patient experiences significant diarrhea, signs of infection like fever/chills, or severe skin reaction, they need to call the doctor.  Patient will receive a drug information handout with shipment.    Handling precautions / disposal reviewed:  Patient will dispose of needles in a sharps container or empty laundry detergent bottle.    Drug Interactions: other medications reviewed and up to date in Epic.  No drug interactions identified.    Comorbidities/Allergies: reviewed and up to date in Epic.    Verified therapy is appropriate and should continue      Delivery Information    Medication Assistance provided: Prior Authorization    Anticipated copay of $3 reviewed with patient. Verified delivery address in Epic.    Scheduled delivery date: Tues, Feb 4  Medication will be delivered via UPS to the home address in [place].  This shipment will not require a signature.      Explained the services we provide at [facility] and that each month we would call to set up refills.  Stressed importance of returning phone calls so that we could ensure they receive their medications in time each month.  Informed patient that we should be setting up refills 7-10 days prior to when they will run out of medication.  Informed patient that welcome packet will be sent.      Patient verbalized understanding of the above information as well as how to contact the pharmacy at [phone] option 4 with any questions/concerns.  The pharmacy is open Monday through Friday 8:30am-4:30pm.  A pharmacist is available 24/7 via pager to answer any clinical questions they may have.        Patient Specific Needs      ? Patient has no physical, cognitive, or cultural barriers.    ? Patient prefers to have medications discussed with  Patient     ? Patient is able to read and understand education materials at a high school level or above.    ? Patient's primary language is  English           Presenter, broadcasting  [facility] Specialty Pharmacist

## 2018-03-16 MED FILL — COSENTYX PEN 300 MG/2 PENS (150 MG/ML) SUBCUTANEOUS: 28 days supply | Qty: 8 | Fill #0 | Status: AC

## 2018-03-16 MED FILL — EMPTY CONTAINER: 120 days supply | Qty: 1 | Fill #0

## 2018-03-16 MED FILL — EMPTY CONTAINER: 120 days supply | Qty: 1 | Fill #0 | Status: AC

## 2018-04-05 NOTE — Unmapped (Signed)
04/04/18 - Pt called stating she had been in hospital and only received one dose of her medication (loading dose). Messaged MD on how to proceed. Doctor Corcimaru responded she had reached out to patient (unsuccessfully) for follow up and noted it was best for patient to restart - but only when completely recovered.   04/06/18 - Left message for patient to inform (if patient has not called MD please advise to do so).   04/06/18 - patient called back and was informed to follow up with Dr. Jena Gauss. Messaged MD as Lorain Childes patient instructed to contact office.

## 2018-04-15 NOTE — Unmapped (Signed)
Patient left call on nurse line this week--I called the patient back today and she said that Dr. Linton Rump needed to know why she was in the hospital before starting Cosentyx--she said that she was in the hospital for mental health issues and to let Dr. Linton Rump know.    Lanora Manis, RN

## 2018-04-25 NOTE — Unmapped (Signed)
I called and spoke with Ms. Tara Christensen.  She was recently in the hospital for depression which is a longstanding issue for her and she does not feel is related to the Cosentyx.  She would like to restart the consent takes.  I will message the pharmacist to see if they can go ahead and restart.    Also let her know that clinics were closed next week because of the coronavirus in [place].  She has been tolerating the Cosentyx well during the dose she took without any issues.  She is aware that a scheduler will get her set up to reschedule once things calm down with the coronavirus.

## 2018-04-26 NOTE — Unmapped (Signed)
I called and left a voicemail with Ms. Tara Christensen.  I recommended holding off on the Cosentyx until the coronavirus stuff clears up because 1 of the possible side effects of Cosentyx is upper respiratory infection.  I asked her to let me know if she has any questions.

## 2018-04-28 ENCOUNTER — Other Ambulatory Visit (HOSPITAL_COMMUNITY): Payer: Self-pay | Admitting: Psychiatry

## 2018-05-26 ENCOUNTER — Telehealth: Payer: Self-pay | Admitting: Cardiology

## 2018-05-26 NOTE — Telephone Encounter (Signed)
Virtual Visit Pre-Appointment Phone Call  Steps For Call:  1. Confirm consent - "In the setting of the current Covid19 crisis, you are scheduled for a (phone or video) visit with your provider on (date) at (time).  Just as we do with many in-office visits, in order for you to participate in this visit, we must obtain consent.  If you'd like, I can send this to your mychart (if signed up) or email for you to review.  Otherwise, I can obtain your verbal consent now.  All virtual visits are billed to your insurance company just like a normal visit would be.  By agreeing to a virtual visit, we'd like you to understand that the technology does not allow for your provider to perform an examination, and thus may limit your provider's ability to fully assess your condition.  Finally, though the technology is pretty good, we cannot assure that it will always work on either your or our end, and in the setting of a video visit, we may have to convert it to a phone-only visit.  In either situation, we cannot ensure that we have a secure connection.  Are you willing to proceed?" STAFF: Did the patient verbally acknowledge consent to telehealth visit? Document YES/NO here: YES  2. Confirm the BEST phone number to call the day of the visit by including in appointment notes  3. Give patient instructions for WebEx/MyChart download to smartphone as below or Doximity/Doxy.me if video visit (depending on what platform provider is using)  4. Advise patient to be prepared with their blood pressure, heart rate, weight, any heart rhythm information, their current medicines, and a piece of paper and pen handy for any instructions they may receive the day of their visit  5. Inform patient they will receive a phone call 15 minutes prior to their appointment time (may be from unknown caller ID) so they should be prepared to answer  6. Confirm that appointment type is correct in Epic appointment notes (VIDEO vs  PHONE)     TELEPHONE CALL NOTE  Breda Mcgaughey has been deemed a candidate for a follow-up tele-health visit to limit community exposure during the Covid-19 pandemic. I spoke with the patient via phone to ensure availability of phone/video source, confirm preferred email & phone number, and discuss instructions and expectations.  I reminded Misty [name] to be prepared with any vital sign and/or heart rhythm information that could potentially be obtained via home monitoring, at the time of her visit. I reminded Misty [name] to expect a phone call at the time of her visit if her visit.  Kittie Plater 05/26/2018 10:08 AM   INSTRUCTIONS FOR DOWNLOADING THE WEBEX APP TO SMARTPHONE  - If Apple, ask patient to go to [employer] and type in WebEx in the search bar. Download [employer], the blue/green circle. If Android, go to [employer] and type in [employer] in the search bar. The app is free but as with any other app downloads, their phone may require them to verify saved payment information or Apple/Android password.  - The patient does NOT have to create an account. - On the day of the visit, the assist will walk the patient through joining the meeting with the meeting number/password.  INSTRUCTIONS FOR DOWNLOADING THE MYCHART APP TO SMARTPHONE  - The patient must first make sure to have activated MyChart and know their login information - If Apple, go to [employer] and type in MyChart in the  search bar and download the app. If Android, ask patient to go to [employer] and type in [employer] in the search bar and download the app. The app is free but as with any other app downloads, their phone may require them to verify saved payment information or Apple/Android password.  - The patient will need to then log into the app with their MyChart username and password, and select [facility] as their healthcare provider to link the account. When it is time for your visit, go to the  MyChart app, find appointments, and click Begin Video Visit. Be sure to Select Allow for your device to access the Microphone and Camera for your visit. You will then be connected, and your provider will be with you shortly.  **If they have any issues connecting, or need assistance please contact MyChart service desk [phone])**  **If using a computer, in order to ensure the best quality for their visit they will need to use either of the following Internet Browsers: [employer], or Google Chrome**  IF USING DOXIMITY or DOXY.ME - The patient will receive a link just prior to their visit, either by text or email (to be determined day of appointment depending on if it's doxy.me or Doximity).     FULL LENGTH CONSENT FOR TELE-HEALTH VISIT   I hereby voluntarily request, consent and authorize [facility] and its employed or contracted physicians, physician assistants, nurse practitioners or other licensed health care professionals (the Practitioner), to provide me with telemedicine health care services (the Services") as deemed necessary by the treating Practitioner. I acknowledge and consent to receive the Services by the Practitioner via telemedicine. I understand that the telemedicine visit will involve communicating with the Practitioner through live audiovisual communication technology and the disclosure of certain medical information by electronic transmission. I acknowledge that I have been given the opportunity to request an in-person assessment or other available alternative prior to the telemedicine visit and am voluntarily participating in the telemedicine visit.  I understand that I have the right to withhold or withdraw my consent to the use of telemedicine in the course of my care at any time, without affecting my right to future care or treatment, and that the Practitioner or I may terminate the telemedicine visit at any time. I understand that I have the right to  inspect all information obtained and/or recorded in the course of the telemedicine visit and may receive copies of available information for a reasonable fee.  I understand that some of the potential risks of receiving the Services via telemedicine include:   Delay or interruption in medical evaluation due to technological equipment failure or disruption;  Information transmitted may not be sufficient (e.g. poor resolution of images) to allow for appropriate medical decision making by the Practitioner; and/or   In rare instances, security protocols could fail, causing a breach of personal health information.  Furthermore, I acknowledge that it is my responsibility to provide information about my medical history, conditions and care that is complete and accurate to the best of my ability. I acknowledge that Practitioner's advice, recommendations, and/or decision may be based on factors not within their control, such as incomplete or inaccurate data provided by me or distortions of diagnostic images or specimens that may result from electronic transmissions. I understand that the practice of medicine is not an exact science and that Practitioner makes no warranties or guarantees regarding treatment outcomes. I acknowledge that I will receive a copy of this consent  concurrently upon execution via email to the email address I last provided but may also request a printed copy by calling the office of [facility].    I understand that my insurance will be billed for this visit.   I have read or had this consent read to me.  I understand the contents of this consent, which adequately explains the benefits and risks of the Services being provided via telemedicine.   I have been provided ample opportunity to ask questions regarding this consent and the Services and have had my questions answered to my satisfaction.  I give my informed consent for the services to be provided through the use of  telemedicine in my medical care  By participating in this telemedicine visit I agree to the above.

## 2018-05-30 ENCOUNTER — Telehealth (INDEPENDENT_AMBULATORY_CARE_PROVIDER_SITE_OTHER): Payer: Medicaid Other | Admitting: Cardiology

## 2018-05-30 ENCOUNTER — Telehealth: Payer: Self-pay | Admitting: Cardiology

## 2018-05-30 ENCOUNTER — Other Ambulatory Visit: Payer: Self-pay

## 2018-05-30 ENCOUNTER — Encounter: Payer: Self-pay | Admitting: Cardiology

## 2018-05-30 DIAGNOSIS — I10 Essential (primary) hypertension: Secondary | ICD-10-CM | POA: Diagnosis not present

## 2018-05-30 DIAGNOSIS — R002 Palpitations: Secondary | ICD-10-CM

## 2018-05-30 DIAGNOSIS — F251 Schizoaffective disorder, depressive type: Secondary | ICD-10-CM

## 2018-05-30 DIAGNOSIS — G4733 Obstructive sleep apnea (adult) (pediatric): Secondary | ICD-10-CM

## 2018-05-30 DIAGNOSIS — F319 Bipolar disorder, unspecified: Secondary | ICD-10-CM

## 2018-05-30 NOTE — Patient Instructions (Addendum)
Medication Instructions:  Your physician recommends that you continue on your current medications as directed. Please refer to the Current Medication list given to you today.  If you need a refill on your cardiac medications before your next appointment, please call your pharmacy.   Lab work: None  If you have labs (blood work) drawn today and your tests are completely normal, you will receive your results only by: Marland Kitchen MyChart Message (if you have MyChart) OR . A paper copy in the mail If you have any lab test that is abnormal or we need to change your treatment, we will call you to review the results.  Testing/Procedures: None  Follow-Up: At [facility], you and your health needs are our priority.  As part of our continuing mission to provide you with exceptional heart care, we have created designated Provider Care Teams.  These Care Teams include your primary Cardiologist (physician) and Advanced Practice Providers (APPs -  Physician Assistants and Nurse Practitioners) who all work together to provide you with the care you need, when you need it. You will need a follow up appointment in 3 months.   Any Other Special Instructions Will Be Listed Below (If Applicable).    Blood Pressure Record Sheet To take your blood pressure, you will need a blood pressure machine. You can buy a blood pressure machine (blood pressure monitor) at your clinic, drug store, or online. When choosing one, consider:  An automatic monitor that has an arm cuff.  A cuff that wraps snugly around your upper arm. You should be able to fit only one finger between your arm and the cuff.  A device that stores blood pressure reading results.  Do not choose a monitor that measures your blood pressure from your wrist or finger. Follow your health care provider's instructions for how to take your blood pressure. To use this form:  Get one reading in the morning (a.m.) before you take any medicines.  Get one  reading in the evening (p.m.) before supper.  Take at least 2 readings with each blood pressure check. This makes sure the results are correct. Wait 1-2 minutes between measurements.  Write down the results in the spaces on this form.  Repeat this once a week, or as told by your health care provider.  Make a follow-up appointment with your health care provider to discuss the results. Blood pressure log Date: _______________________  a.m. _____________________(1st reading) _____________________(2nd reading)  p.m. _____________________(1st reading) _____________________(2nd reading) Date: _______________________  a.m. _____________________(1st reading) _____________________(2nd reading)  p.m. _____________________(1st reading) _____________________(2nd reading) Date: _______________________  a.m. _____________________(1st reading) _____________________(2nd reading)  p.m. _____________________(1st reading) _____________________(2nd reading) Date: _______________________  a.m. _____________________(1st reading) _____________________(2nd reading)  p.m. _____________________(1st reading) _____________________(2nd reading) Date: _______________________  a.m. _____________________(1st reading) _____________________(2nd reading)  p.m. _____________________(1st reading) _____________________(2nd reading) This information is not intended to replace advice given to you by your health care provider. Make sure you discuss any questions you have with your health care provider. Document Released: 10/24/2002 Document Revised: 01/25/2017 Document Reviewed: 01/25/2017 Elsevier Interactive Patient Education  2019 [employer].

## 2018-05-30 NOTE — Telephone Encounter (Signed)
° °  Cardiac Questionnaire:    Since your last visit or hospitalization:    1. Have you been having new or worsening chest pain? no   2. Have you been having new or worsening shortness of breath? yes 3. Have you been having new or worsening leg swelling, wt gain, or increase in abdominal girth (pants fitting more tightly)? no   4. Have you had any passing out spells? no    *A YES to any of these questions would result in the appointment being kept. *If all the answers to these questions are NO, we should indicate that given the current situation regarding the worldwide coronarvirus pandemic, at the recommendation of the CDC, we are looking to limit gatherings in our waiting area, and thus will reschedule their appointment beyond four weeks from today.   _____________   COVID-19 Pre-Screening Questions:   Do you currently have a fever? no (yes = cancel and refer to pcp for e-visit)  Have you recently travelled on a cruise, internationally, or to [place], [place], [place], [place], [place], or [place], [place]) ? no (yes = cancel, stay home, monitor symptoms, and contact pcp or initiate e-visit if symptoms develop)  Have you been in contact with someone that is currently pending confirmation of Covid19 testing or has been confirmed to have the Covid19 virus?  no (yes = cancel, stay home, away from tested individual, monitor symptoms, and contact pcp or initiate e-visit if symptoms develop)  Are you currently experiencing fatigue or cough? no (yes = pt should be prepared to have a mask placed at the time of their visit).

## 2018-05-30 NOTE — Progress Notes (Signed)
Virtual Visit via Video Note   This visit type was conducted due to national recommendations for restrictions regarding the COVID-19 Pandemic (e.g. social distancing) in an effort to limit this patient's exposure and mitigate transmission in our community.  Due to her co-morbid illnesses, this patient is at least at moderate risk for complications without adequate follow up.  This format is felt to be most appropriate for this patient at this time.  All issues noted in this document were discussed and addressed.  A limited physical exam was performed with this format.  Please refer to the patient's chart for her consent to telehealth for [facility].  Evaluation Performed:  Follow-up visit  This visit type was conducted due to national recommendations for restrictions regarding the COVID-19 Pandemic (e.g. social distancing).  This format is felt to be most appropriate for this patient at this time.  All issues noted in this document were discussed and addressed.  No physical exam was performed (except for noted visual exam findings with Video Visits).  Please refer to the patient's chart (MyChart message for video visits and phone note for telephone visits) for the patient's consent to telehealth for [facility].  Date:  05/30/2018  ID: Misty [name], MRN [id number]   Patient Location: [address] [place] [id number]   Provider location:   [facility] [place] [facility]  PCP:  Nancie NeasLowder, Tiffany N, [place]  Cardiologist:  Gypsy Balsamobert Koah Chisenhall, MD     Chief Complaint: Doing well  History of Present Illness:    Misty [name] is a 23 y.o. female  who presents via audio/video conferencing for a telehealth visit today.  With hypertension tachycardia morbid obesity bipolar disorder she has regular visit with me today it looks like chief complaint right now is that her blood pressure is elevated.  When she check her blood pressure usually 150/1 60/90 she check her blood  pressure regularly she does have cough which according to her is large and it is the one that she use on the arm I told her not to use the measurements on her wrist.  We talked in length about this today and we decided to simply check her blood pressure more often and see truly what her blood pressure is therefore she will be checking her blood pressure twice a day for next week and then we will contact her to make adjustment to her medications.  Denies having a palpitations described the fact that she does not walk anymore right now because she is afraid of coronavirus.  Denies have any chest pain no palpitations.   The patient does not have symptoms concerning for COVID-19 infection (fever, chills, cough, or new SHORTNESS OF BREATH).    Prior CV studies:   The following studies were reviewed today:       Past Medical History:  Diagnosis Date  . Allergic rhinitis   . Anxiety   . Depression   . GERD (gastroesophageal reflux disease)   . HTN (hypertension) 07/13/12  . IBS (irritable bowel syndrome)   . Obesity   . OSA on CPAP   . Psoriasis     Past Surgical History:  Procedure Laterality Date  . ADENOIDECTOMY    . SINOSCOPY    . TONSILLECTOMY AND ADENOIDECTOMY  Age 23   Deviated septum corrected at same time     Current Meds  Medication Sig  . dicyclomine (BENTYL) 10 MG capsule Take 1 capsule (10 mg total) by mouth  3 (three) times daily with meals as needed for spasms.  Marland Kitchen EPINEPHrine 0.3 mg/0.3 mL IJ SOAJ injection Use as directed for life threatening allergic reaction  . feeding supplement, ENSURE ENLIVE, (ENSURE ENLIVE) LIQD Take 237 mLs by mouth 3 times/day as needed-between meals & bedtime (meal replacement). (May buy from over the counter): Nutritional supplement  . ferrous sulfate 325 (65 FE) MG tablet Take 1 tablet (325 mg total) by mouth daily with breakfast. (May buy from the over the counter): For anemia  . fluticasone (FLOVENT HFA) 110 MCG/ACT inhaler Inhale 3  puffs 3 times daily only during asthma flare ups. Rinse mouth after use: For asthma  . guanFACINE HCl (INTUNIV PO) Take 1 tablet by mouth daily.  . haloperidol (HALDOL) 5 MG tablet Take 1 tablet (5 mg total) by mouth 3 (three) times daily. For mood control  . hydrOXYzine (ATARAX/VISTARIL) 50 MG tablet Take 1 tablet (50 mg total) by mouth every 6 (six) hours as needed for anxiety.  . Ipratropium-Albuterol (COMBIVENT) 20-100 MCG/ACT AERS respimat Inhale 1 puff into the lungs every 6 (six) hours. For shortness of breath  . levothyroxine (SYNTHROID, LEVOTHROID) 25 MCG tablet Take 1 tablet (25 mcg total) by mouth daily at 6 (six) AM. For hormone replacement (Patient taking differently: Take 50 mcg by mouth daily at 6 (six) AM. For hormone replacement)  . lisinopril (PRINIVIL,ZESTRIL) 40 MG tablet Take 1 tablet (40 mg total) by mouth daily. For high blood pressure  . loratadine (CLARITIN) 10 MG tablet Take 1 tablet (10 mg total) by mouth daily. (May buy from over the counter): For allergies  . mometasone-formoterol (DULERA) 200-5 MCG/ACT AERO Inhale 2 puffs into the lungs 2 (two) times daily. For shortness of breath  . montelukast (SINGULAIR) 10 MG tablet Take 1 tablet (10 mg total) by mouth at bedtime. For asthma  . Prenatal Vit-Fe Fumarate-FA (PRENATAL MULTIVITAMIN) TABS tablet Take 1 tablet by mouth daily at 12 noon. (May buy from over the counter): Vitamin supplement  . ranitidine (ZANTAC) 300 MG tablet Take 1 tablet (300 mg total) by mouth at bedtime. For acid reflux  . sertraline (ZOLOFT) 50 MG tablet Take 1 tablet (50 mg total) by mouth daily. For depression      Family History: The patient's family history includes Allergic rhinitis in her mother; Bipolar disorder in her father; Depression in her maternal grandmother and mother; Diabetes in her maternal grandmother; Drug abuse in her father; Hypertension in her father and maternal grandmother.   ROS:   Please see the history of present  illness.     All other systems reviewed and are negative.   Labs/Other Tests and Data Reviewed:     Recent Labs: 12/17/2017: ALT 27; Hemoglobin 14.7; Platelets 277; TSH 3.278 12/20/2017: BUN 11; Creatinine, Ser 0.60; Potassium 4.1; Sodium 140  Recent Lipid Panel    Component Value Date/Time   CHOL 155 04/17/2017 0644   TRIG 135 04/17/2017 0644   HDL 46 04/17/2017 0644   CHOLHDL 3.4 04/17/2017 0644   VLDL 27 04/17/2017 0644   LDLCALC 82 04/17/2017 0644      Exam:    Vital Signs:  There were no vitals taken for this visit.    Wt Readings from Last 3 Encounters:  10/21/17 (!) 404 lb 6.4 oz (183.4 kg)  09/12/17 (!) 412 lb (186.9 kg)  12/14/16 (!) 439 lb (199.1 kg)     Well nourished, well developed in no acute distress. At awakening 2003 with talking on the video  link.  She is happy to be able to talk to me she is obese obviously no swelling of lower extremities  Diagnosis for this visit:   1. Essential hypertension   2. Obstructive sleep apnea   3. Palpitations   4. Bipolar I disorder (HCC)   5. Schizoaffective disorder, depressive type (HCC)      ASSESSMENT & PLAN:    1.  Essential hypertension.  Appears to be a problem.  Plan is as outlined above. 2.  Obstructive sleep apnea.  She is CPAP mask on the regular basis. 3.  Palpitations denies having any. Bipolar disorder with schizoaffective disorder followed by psychiatry.  COVID-19 Education: The signs and symptoms of COVID-19 were discussed with the patient and how to seek care for testing (follow up with PCP or arrange E-visit).  The importance of social distancing was discussed today.  Patient Risk:   After full review of this patients clinical status, I feel that they are at least moderate risk at this time.  Time:   Today, I have spent 20 minutes with the patient with telehealth technology discussing pt health issues.  I spent 5 minutes reviewing her chart before the visit.  Visit was finished at .     Medication Adjustments/Labs and Tests Ordered: Current medicines are reviewed at length with the patient today.  Concerns regarding medicines are outlined above.  No orders of the defined types were placed in this encounter.  Medication changes: No orders of the defined types were placed in this encounter.    Disposition: Follow-up in 3 months.  She will get in touch with [place] in 1 week with her blood pressure measurements.  She most likely requires medication to control her blood pressure  Signed, Georgeanna Lea, MD, [facility] 05/30/2018 4:15 PM    [facility]

## 2018-06-02 ENCOUNTER — Telehealth: Payer: Self-pay

## 2018-06-02 NOTE — Telephone Encounter (Signed)
Patient states she was instructed to take BP twice daily.  06/01/18 @ 1800 BP 172/91  06/02/18 @ 1120 BP  195/163 1 hour after morning med Patient is experiencing dizziness/lightheadedness, headache. Mild chest pain of 7/10. No swelling in hands or feet but having general malaise. Information relayed to Dr. Kirtland Bouchard to advise?

## 2018-06-05 MED ORDER — HYDROCHLOROTHIAZIDE 25 MG PO TABS: 25.0000 mg | ORAL_TABLET | Freq: Every day | ORAL | 1 refills | Status: DC

## 2018-06-05 MED ORDER — LISINOPRIL 40 MG PO TABS: 40.0000 mg | ORAL_TABLET | Freq: Every day | ORAL | 1 refills | Status: DC

## 2018-06-05 NOTE — Addendum Note (Signed)
Addended by: Lita Mains on: 06/05/2018 02:30 PM   Modules accepted: Orders

## 2018-06-05 NOTE — Telephone Encounter (Signed)
Patient called back and reports she is taking hydrochlorothiazide 25 mg daily. Dr. Bing Matter made aware. He advised patient to continue taking medication and monitor blood pressure log letting us know of any concerns. Patient needs refill for lisinopril 40 mg daily and will need hydrochlorothiazide 25 mg daily ordered as well since the rx from the hospital was only for 30 day supply. Patient verbally understands all information. Medications will be refilled.

## 2018-06-05 NOTE — Telephone Encounter (Signed)
Patient reports she went to [facility] and was started on a medication there. She reports her blood pressure is much better now since starting the medication. Patient is unsure of the medication at this time, she will check and call us back to let us know when she gets home. Advised patient to continue to keep a log of blood pressures and to be sure to let us know which medication she is taking so we can advise on that. Patient verbally understands, no further questions.

## 2018-06-05 NOTE — Telephone Encounter (Signed)
BP elevated  Start HCTZ 12.5 mg po qd  chem7 in 1 week

## 2018-06-06 ENCOUNTER — Telehealth: Payer: Self-pay | Admitting: *Deleted

## 2018-06-06 NOTE — Telephone Encounter (Signed)
Telephone call to patient for recent blood pressure per Dr. Bing Matter. Pt states she went to the ED Sat for her high blood pressure and was started on HCTZ 25mg  daily along with her lisinopril 40mg  daily.  Sun 06/04/18      209/109 Mon 06/05/18      128/60 Tues 06/06/18     143/98

## 2018-08-25 NOTE — Unmapped (Signed)
I returned the call from Tara Christensen.  She would now like to restart the Cosentyx.  She is overall at her baseline state of health.  She says she had some upper respiratory symptoms about a month ago but she has been symptom-free for at least a month and otherwise feeling well.  I let her know that I would ask the pharmacist to go ahead and get her restarted.

## 2018-08-25 NOTE — Unmapped (Signed)
Call from patient.  Wants to know if she is able to restart Cosentyx at this point.

## 2018-08-28 NOTE — Unmapped (Signed)
Left VM for patient - received message from dermatology clinic that it's OK for Wave to resume Cosentyx treatment. It's unclear if she used any of her loading doses previously, or if she'd need more. I asked her to call us back at [phone], option 4.    Shakoya Gilmore A. Katrinka Blazing, PharmD, BCPS - Pharmacist   [facility]   [address], [place], [place] [address]   t [phone] - f [phone]

## 2018-08-29 MED ORDER — COSENTYX PEN 300 MG/2 PENS (150 MG/ML) SUBCUTANEOUS
0 refills | 0 days
Start: 2018-08-29 — End: ?

## 2018-09-01 ENCOUNTER — Telehealth (INDEPENDENT_AMBULATORY_CARE_PROVIDER_SITE_OTHER): Payer: Medicaid Other | Admitting: Cardiology

## 2018-09-01 ENCOUNTER — Encounter: Payer: Self-pay | Admitting: Cardiology

## 2018-09-01 ENCOUNTER — Other Ambulatory Visit: Payer: Self-pay

## 2018-09-01 VITALS — BP 158/0

## 2018-09-01 DIAGNOSIS — I10 Essential (primary) hypertension: Secondary | ICD-10-CM

## 2018-09-01 DIAGNOSIS — R002 Palpitations: Secondary | ICD-10-CM

## 2018-09-01 DIAGNOSIS — M7989 Other specified soft tissue disorders: Secondary | ICD-10-CM

## 2018-09-01 DIAGNOSIS — F319 Bipolar disorder, unspecified: Secondary | ICD-10-CM

## 2018-09-01 DIAGNOSIS — E8881 Metabolic syndrome: Secondary | ICD-10-CM

## 2018-09-01 NOTE — Patient Instructions (Signed)
Medication Instructions:  Your physician recommends that you continue on your current medications as directed. Please refer to the Current Medication list given to you today.  If you need a refill on your cardiac medications before your next appointment, please call your pharmacy.   Lab work: None.  If you have labs (blood work) drawn today and your tests are completely normal, you will receive your results only by: . MyChart Message (if you have MyChart) OR . A paper copy in the mail If you have any lab test that is abnormal or we need to change your treatment, we will call you to review the results.  Testing/Procedures: None   Follow-Up: At [facility], you and your health needs are our priority.  As part of our continuing mission to provide you with exceptional heart care, we have created designated Provider Care Teams.  These Care Teams include your primary Cardiologist (physician) and Advanced Practice Providers (APPs -  Physician Assistants and Nurse Practitioners) who all work together to provide you with the care you need, when you need it. You will need a follow up appointment in 2 months.  Please call our office 2 months in advance to schedule this appointment.  You may see No primary care provider on file. or another member of our [facility] Provider Team in [place]: Brian Munley, MD . Rajan Revankar, MD  Any Other Special Instructions Will Be Listed Below (If Applicable).    

## 2018-09-01 NOTE — Progress Notes (Signed)
Virtual Visit via Telephone Note   This visit type was conducted due to national recommendations for restrictions regarding the COVID-19 Pandemic (e.g. social distancing) in an effort to limit this patient's exposure and mitigate transmission in our community.  Due to her co-morbid illnesses, this patient is at least at moderate risk for complications without adequate follow up.  This format is felt to be most appropriate for this patient at this time.  The patient did not have access to video technology/had technical difficulties with video requiring transitioning to audio format only (telephone).  All issues noted in this document were discussed and addressed.  No physical exam could be performed with this format.  Please refer to the patient's chart for her  consent to telehealth for [facility].  Evaluation Performed:  Follow-up visit  This visit type was conducted due to national recommendations for restrictions regarding the COVID-19 Pandemic (e.g. social distancing).  This format is felt to be most appropriate for this patient at this time.  All issues noted in this document were discussed and addressed.  No physical exam was performed (except for noted visual exam findings with Video Visits).  Please refer to the patient's chart (MyChart message for video visits and phone note for telephone visits) for the patient's consent to telehealth for [facility].  Date:  09/01/2018  ID: Misty [name], DOB 1995-10-23, MRN [id number]   Patient Location: [place] [id number]   Provider location:   [place] [facility]  PCP:  Alfredo Martinez, [place]  Cardiologist:  Jenne Campus, MD     Chief Complaint: Doing well  History of Present Illness:    Misty [name] is a 23 y.o. female  who presents via audio/video conferencing for a telehealth visit today.  With hypertension, morbid obesity, depression, obstructive sleep apnea.  Does have a visit with me today  the problem seems to be blood pressure being somewhat difficult to control.  I asked her to check her blood pressure on a regular basis and usual readings systolic above 932.  I think we will be forced to add some additional medication to everything what she takes.  I asked her today to come to my office next week to have Chem-7 done if Chem-7 is acceptable we will start with Aldactone.  Previously she was on Norvasc however she does have a chronic swelling as well as lymphedema of lower extremities which make the situation difficult.   The patient does not have symptoms concerning for COVID-19 infection (fever, chills, cough, or new SHORTNESS OF BREATH).    Prior CV studies:   The following studies were reviewed today:       Past Medical History:  Diagnosis Date  . Allergic rhinitis   . Anxiety   . Depression   . GERD (gastroesophageal reflux disease)   . HTN (hypertension) 07/13/12  . IBS (irritable bowel syndrome)   . Obesity   . OSA on CPAP   . Psoriasis     Past Surgical History:  Procedure Laterality Date  . ADENOIDECTOMY    . SINOSCOPY    . TONSILLECTOMY AND ADENOIDECTOMY  Age 36   Deviated septum corrected at same time     Current Meds  Medication Sig  . dicyclomine (BENTYL) 10 MG capsule Take 1 capsule (10 mg total) by mouth 3 (three) times daily with meals as needed for spasms.  Marland Kitchen EPINEPHrine 0.3 mg/0.3 mL IJ SOAJ injection Use as directed for  life threatening allergic reaction  . feeding supplement, ENSURE ENLIVE, (ENSURE ENLIVE) LIQD Take 237 mLs by mouth 3 times/day as needed-between meals & bedtime (meal replacement). (May buy from over the counter): Nutritional supplement  . ferrous sulfate 325 (65 FE) MG tablet Take 1 tablet (325 mg total) by mouth daily with breakfast. (May buy from the over the counter): For anemia  . fluticasone (FLOVENT HFA) 110 MCG/ACT inhaler Inhale 3 puffs 3 times daily only during asthma flare ups. Rinse mouth after use: For asthma   . guanFACINE HCl (INTUNIV PO) Take 1 tablet by mouth daily.  . haloperidol (HALDOL) 5 MG tablet Take 1 tablet (5 mg total) by mouth 3 (three) times daily. For mood control  . hydrochlorothiazide (HYDRODIURIL) 25 MG tablet Take 1 tablet (25 mg total) by mouth daily.  . hydrOXYzine (ATARAX/VISTARIL) 50 MG tablet Take 1 tablet (50 mg total) by mouth every 6 (six) hours as needed for anxiety.  . Ipratropium-Albuterol (COMBIVENT) 20-100 MCG/ACT AERS respimat Inhale 1 puff into the lungs every 6 (six) hours. For shortness of breath  . levothyroxine (SYNTHROID, LEVOTHROID) 25 MCG tablet Take 1 tablet (25 mcg total) by mouth daily at 6 (six) AM. For hormone replacement (Patient taking differently: Take 50 mcg by mouth daily at 6 (six) AM. For hormone replacement)  . lisinopril (ZESTRIL) 40 MG tablet Take 1 tablet (40 mg total) by mouth daily. For high blood pressure  . lithium carbonate 300 MG capsule Take 300 mg by mouth 3 (three) times daily with meals.  Marland Kitchen. loratadine (CLARITIN) 10 MG tablet Take 1 tablet (10 mg total) by mouth daily. (May buy from over the counter): For allergies  . mometasone-formoterol (DULERA) 200-5 MCG/ACT AERO Inhale 2 puffs into the lungs 2 (two) times daily. For shortness of breath  . montelukast (SINGULAIR) 10 MG tablet Take 1 tablet (10 mg total) by mouth at bedtime. For asthma  . Prenatal Vit-Fe Fumarate-FA (PRENATAL MULTIVITAMIN) TABS tablet Take 1 tablet by mouth daily at 12 noon. (May buy from over the counter): Vitamin supplement      Family History: The patient's family history includes Allergic rhinitis in her mother; Bipolar disorder in her father; Depression in her maternal grandmother and mother; Diabetes in her maternal grandmother; Drug abuse in her father; Hypertension in her father and maternal grandmother.   ROS:   Please see the history of present illness.     All other systems reviewed and are negative.   Labs/Other Tests and Data Reviewed:     Recent  Labs: 12/17/2017: ALT 27; Hemoglobin 14.7; Platelets 277; TSH 3.278 12/20/2017: BUN 11; Creatinine, Ser 0.60; Potassium 4.1; Sodium 140  Recent Lipid Panel    Component Value Date/Time   CHOL 155 04/17/2017 0644   TRIG 135 04/17/2017 0644   HDL 46 04/17/2017 0644   CHOLHDL 3.4 04/17/2017 0644   VLDL 27 04/17/2017 0644   LDLCALC 82 04/17/2017 0644      Exam:    Vital Signs:  BP (!) 158/0     Wt Readings from Last 3 Encounters:  10/21/17 (!) 404 lb 6.4 oz (183.4 kg)  09/12/17 (!) 412 lb (186.9 kg)  12/14/16 (!) 439 lb (199.1 kg)     Well nourished, well developed in no acute distress. Alert awake and x3.  Unable to establish video link therefore I talked to her only on the phone line.  Not in any distress during my conversation with her  Diagnosis for this visit:   1. Essential  hypertension   2. Palpitations   3. Metabolic syndrome   4. Bipolar I disorder (HCC)   5. Swelling of both lower extremities      ASSESSMENT & PLAN:    1.  Essential hypertension plus outlined above we will get her Chem-7 a Chem-7 acceptable will start Aldactone. 2.  Palpitations denies having any. 3.  Metabolic syndrome we talk again about need to exercise need to lose weight she said she will think it over. 4.  Bipolar disorder followed by psychiatry. 4.  Swelling of lower extremities stable.  COVID-19 Education: The signs and symptoms of COVID-19 were discussed with the patient and how to seek care for testing (follow up with PCP or arrange E-visit).  The importance of social distancing was discussed today.  Patient Risk:   After full review of this patients clinical status, I feel that they are at least moderate risk at this time.  Time:   Today, I have spent 15 minutes with the patient with telehealth technology discussing pt health issues.  I spent 5 minutes reviewing her chart before the visit.  Visit was finished at 3:48 PM.    Medication Adjustments/Labs and Tests Ordered:  Current medicines are reviewed at length with the patient today.  Concerns regarding medicines are outlined above.  No orders of the defined types were placed in this encounter.  Medication changes: No orders of the defined types were placed in this encounter.    Disposition: Follow-up 6 weeks  Signed, Georgeanna Leaobert J. Krasowski, MD, [facility] HospitalFACC 09/01/2018 3:46 PM    [facility]

## 2018-09-01 NOTE — Addendum Note (Signed)
Addended by: Ashok Norris on: 09/01/2018 03:57 PM   Modules accepted: Orders

## 2018-09-04 MED FILL — COSENTYX PEN 300 MG/2 PENS (150 MG/ML) SUBCUTANEOUS: 28 days supply | Qty: 8 | Fill #0 | Status: AC

## 2018-09-04 MED FILL — COSENTYX PEN 300 MG/2 PENS (150 MG/ML) SUBCUTANEOUS: 28 days supply | Qty: 8 | Fill #0

## 2018-09-04 NOTE — Unmapped (Addendum)
Tara Christensen will plan to re-load on Cosentyx and start with the two pens she has remaining.     She'll plan to finish loading doses on 8/29, and will need first maintenance dose on 9/26.     [facility] Clinical Assessment & Refill Coordination Note    Tara Christensen, DOB: [birth year]  Phone: [phone] (home)     All above HIPAA information was verified with patient.     Specialty Medication(s):   Inflammatory Disorders: Cosentyx     Current Outpatient Medications   Medication Sig Dispense Refill   ??? albuterol (PROVENTIL HFA) 90 mcg/actuation inhaler Inhale 2 puffs every six (6) hours as needed for wheezing.     ??? azelastine (ASTELIN) 137 mcg (0.1 %) nasal spray 1 spray by Each Nare route two (2) times a day as needed for rhinitis. Use in each nostril as directed     ??? BECLOMETHASONE DIPROPIONATE (QVAR INHL) Inhale 3 puffs Three (3) times a day as needed.     ??? buPROPion (WELLBUTRIN XL) 150 MG 24 hr tablet TAKE THREE TABLETS BY MOUTH EVERY MORNING     ??? buPROPion (WELLBUTRIN XL) 300 MG 24 hr tablet Take 300 mg by mouth daily.     ??? busPIRone (BUSPAR) 10 MG tablet Take 15 mg by mouth Three (3) times a day.      ??? carvedilol (COREG) 25 MG tablet Take 1 tablet (25 mg total) by mouth 2 (two) times a day with meals. 60 tablet 3   ??? cholecalciferol, vitamin D3, (VITAMIN D3) 2,000 unit cap Take by mouth daily.     ??? clobetasoL (TEMOVATE) 0.05 % external solution Apply topically Two (2) times a day. To the scalp until clear then stop 50 mL 6   ??? dicyclomine (BENTYL) 10 mg capsule Take 10 mg by mouth Three (3) times a day as needed.     ??? divalproex ER (DEPAKOTE ER) 250 MG extended release 24 hr tablet Take 1 pill at bedtime for 1 wk, then take 2 pills every night 60 tablet 6   ??? divalproex ER (DEPAKOTE ER) 250 MG extended release 24 hr tablet Take 3 tablets (750 mg total) by mouth daily. 90 tablet 6   ??? eletriptan (RELPAX) 40 MG tablet Take 1 pill at onset HA, may repeat in 2 hours if needed but no more than 2 in 24 hours (Patient not taking: Reported on 08/25/2016) 9 tablet 6   ??? eluxadoline (VIBERZI) 100 mg Tab Take 100 mg by mouth Two (2) times a day.     ??? empty container Misc Use as directed with Cosentyx injections 1 each 2   ??? EPINEPHrine (EPIPEN) 0.3 mg/0.3 mL injection Use as directed for life threatening allergic reaction  3   ??? ERGOCALCIFEROL, VITAMIN D2, (VITAMIN D2 ORAL) Take 1.25 mg by mouth.     ??? ferrous sulfate 325 (65 FE) MG tablet Take 325 mg by mouth daily with breakfast.     ??? fexofenadine (ALLEGRA) 180 MG tablet Take 180 mg by mouth daily.     ??? fluticasone (FLONASE) 50 mcg/actuation nasal spray 1 spray by Each Nare route Two (2) times a day.     ??? furosemide (LASIX) 40 MG tablet TAKE ONE TABLET BY MOUTH EVERY DAY (Patient not taking: Reported on 03/01/2018) 90 tablet 2   ??? gabapentin (NEURONTIN) 400 MG capsule Take 400 mg by mouth Three (3) times a day.     ??? hydrOXYzine (ATARAX) 50 MG  tablet TAKE ONE TABLET BY MOUTH EVERY 6 HOURS AS NEEDED FOR ANXIETY     ??? ipratropium-albuterol (DUO-NEB) 0.5-2.5 mg/3 mL nebulizer Inhale 3 mL every six (6) hours as needed.     ??? levETIRAcetam (KEPPRA) 500 MG tablet Take 2 tablets (1,000 mg total) by mouth Two (2) times a day. 120 tablet 5   ??? levothyroxine (SYNTHROID) 25 MCG tablet Take 25 mcg by mouth daily.     ??? lisinopril (PRINIVIL,ZESTRIL) 10 MG tablet TAKE ONE TABLET BY MOUTH EVERY DAY 90 tablet 2   ??? loratadine (CLARITIN) 10 mg tablet TAKE TWO TABLETS BY MOUTH DAILY FOR ITCHING     ??? magnesium oxide (MAG-OX) 400 mg tablet Take 400 mg by mouth daily. 1 AND 1/2 TABLET ONCE A DAY     ??? metroNIDAZOLE (METROCREAM) 0.75 % cream Apply 1 application topically Two (2) times a day.     ??? mometasone-formoterol (DULERA) 200-5 mcg/actuation HFAA Inhale 2 puffs Two (2) times a day.      ??? montelukast (SINGULAIR) 10 mg tablet Take 10 mg by mouth nightly.     ??? multivitamin (TAB-A-VITE/THERAGRAN) per tablet Take 1 tablet by mouth daily.     ??? naproxen (NAPROSYN) 250 MG tablet TAKE ONE TABLET BY MOUTH TWICE DAILY AS NEEDED WITH FOOD     ??? olopatadine (PAZEO) 0.7 % ophthalmic solution Administer 1 drop to both eyes daily.     ??? omeprazole (PRILOSEC) 40 MG capsule TAKE 1 CAPSULE BY MOUTH ONCE DAILY.  11   ??? paliperidone (INVEGA) 6 MG 24 hr tablet Take 6 mg by mouth daily.     ??? paliperidone (INVEGA) 9 MG 24 hr tablet Take 9 mg by mouth nightly.     ??? pantoprazole (PROTONIX) 40 MG tablet Take 40 mg by mouth Two (2) times a day.     ??? potassium chloride (KLOR-CON) 10 MEQ CR tablet TAKE ONE TABLET BY MOUTH EVERY DAY (Patient not taking: Reported on 03/01/2018) 90 tablet 1   ??? prazosin (MINIPRESS) 1 MG capsule Take 2 mg by mouth nightly.      ??? progesterone (PROMETRIUM) 200 MG capsule Take 200 mg by mouth nightly.     ??? ranitidine (ZANTAC) 300 MG tablet Take 300 mg by mouth nightly.     ??? riboflavin, vitamin B2, 100 mg Tab Take 100 mg by mouth daily.     ??? risperiDONE (RISPERDAL M-TABS) 2 MG disintegrating tablet Take 2 mg by mouth nightly.     ??? secukinumab (COSENTYX PEN, 2 PENS,) 150 mg/mL PnIj injection Inject the contents of 2 pens (300mg ) under the skin once weekly for 5 weeks for loading dose. 10 mL 0   ??? secukinumab (COSENTYX PEN, 2 PENS,) 150 mg/mL PnIj injection Inject the contents of 2 pens (300 mg) under the skin once weekly at weeks 0, 1, 2, 3, and 4. Then inject the contents of 2 pens (300 mg) every 4 weeks. 14 mL 2   ??? sertraline (ZOLOFT) 50 MG tablet Take 50 mg by mouth daily.     ??? topiramate (TOPAMAX ORAL) Take by mouth daily.     ??? traZODone (DESYREL) 50 MG tablet Take 50 mg by mouth nightly.       No current facility-administered medications for this visit.         Changes to medications: Tara Christensen reports no changes at this time.    Allergies   Allergen Reactions   ??? Tramadol Rash     Pt reports itching  as well   ??? Adhesive Rash       Changes to allergies: No    SPECIALTY MEDICATION ADHERENCE     Cosentyx - 2 pens left (7 day supply)       Specialty medication(s) dose(s) confirmed: Regimen is correct and unchanged.     Are there any concerns with adherence? No    Adherence counseling provided? Not needed    CLINICAL MANAGEMENT AND INTERVENTION      Clinical Benefit Assessment:    Do you feel the medicine is effective or helping your condition? not applicable - patient has been off treatment    Clinical Benefit counseling provided? Not needed    Adverse Effects Assessment:    Are you experiencing any side effects? No    Are you experiencing difficulty administering your medicine? No    Quality of Life Assessment:    How many days over the past month did your psoriasi  keep you from your normal activities? For example, brushing your teeth or getting up in the morning. 0    Have you discussed this with your provider? Not needed    Therapy Appropriateness:    Is therapy appropriate? Yes, therapy is appropriate and should be continued    DISEASE/MEDICATION-SPECIFIC INFORMATION      For patients on injectable medications: Patient currently has 1 doses left.  Next injection is scheduled for this week.    PATIENT SPECIFIC NEEDS     ? Does the patient have any physical, cognitive, or cultural barriers? No    ? Is the patient high risk? No     ? Does the patient require a Care Management Plan? No     ? Does the patient require physician intervention or other additional services (i.e. nutrition, smoking cessation, social work)? No      SHIPPING     Specialty Medication(s) to be Shipped:   Inflammatory Disorders: Cosentyx    Other medication(s) to be shipped: na     Changes to insurance: No    Delivery Scheduled: Yes, Expected medication delivery date: Tues, July 28.     Medication will be delivered via UPS to the confirmed prescription address in [facility].    The patient will receive a drug information handout for each medication shipped and additional FDA Medication Guides as required.  Verified that patient has previously received a Conservation officer, historic buildings.    All of the patient's questions and concerns have been addressed.    Lanney Gins   [facility] Specialty Pharmacist

## 2018-09-05 LAB — BASIC METABOLIC PANEL
BUN/Creatinine Ratio: 16 (ref 9–23)
BUN: 10 mg/dL (ref 6–20)
CO2: 21 mmol/L (ref 20–29)
Calcium: 9.1 mg/dL (ref 8.7–10.2)
Chloride: 101 mmol/L (ref 96–106)
Creatinine, Ser: 0.61 mg/dL (ref 0.57–1.00)
GFR calc Af Amer: 148 mL/min/{1.73_m2} (ref >59)
GFR calc non Af Amer: 128 mL/min/{1.73_m2} (ref >59)
Glucose: 108 mg/dL — ABNORMAL HIGH (ref 65–99)
Potassium: 3.9 mmol/L (ref 3.5–5.2)
Sodium: 137 mmol/L (ref 134–144)

## 2018-09-06 ENCOUNTER — Telehealth: Payer: Self-pay

## 2018-09-06 DIAGNOSIS — R002 Palpitations: Secondary | ICD-10-CM

## 2018-09-06 DIAGNOSIS — I10 Essential (primary) hypertension: Secondary | ICD-10-CM

## 2018-09-06 MED ORDER — SPIRONOLACTONE 25 MG PO TABS: 25.0000 mg | ORAL_TABLET | Freq: Every day | ORAL | 3 refills | Status: DC

## 2018-09-06 NOTE — Telephone Encounter (Signed)
Left message for patient to call back for results.  

## 2018-09-06 NOTE — Addendum Note (Signed)
Addended by: Beckey Rutter on: 09/06/2018 03:22 PM   Modules accepted: Orders

## 2018-09-06 NOTE — Telephone Encounter (Signed)
Patient informed and in agreement with starting medication/repeat labs. No further questions at this time.

## 2018-09-06 NOTE — Telephone Encounter (Signed)
-----   Message from Park Liter, MD sent at 09/06/2018  9:10 AM EDT ----- Start aldactone 25 mg po qd  chem7 in 1 week

## 2018-09-13 LAB — BASIC METABOLIC PANEL
BUN/Creatinine Ratio: 19 (ref 9–23)
BUN: 12 mg/dL (ref 6–20)
CO2: 20 mmol/L (ref 20–29)
Calcium: 9.4 mg/dL (ref 8.7–10.2)
Chloride: 102 mmol/L (ref 96–106)
Creatinine, Ser: 0.63 mg/dL (ref 0.57–1.00)
GFR calc Af Amer: 146 mL/min/{1.73_m2} (ref >59)
GFR calc non Af Amer: 127 mL/min/{1.73_m2} (ref >59)
Glucose: 105 mg/dL — ABNORMAL HIGH (ref 65–99)
Potassium: 3.7 mmol/L (ref 3.5–5.2)
Sodium: 141 mmol/L (ref 134–144)

## 2018-10-01 NOTE — Unmapped (Addendum)
This encounter was created in error - please disregard.

## 2018-10-05 NOTE — Unmapped (Signed)
This encounter was created in error - please disregard.

## 2018-10-05 NOTE — Unmapped (Signed)
Addended by: Inis Sizer A on: 10/04/2018 08:45 PM     Modules accepted: Level of Service, SmartSet

## 2018-10-23 NOTE — Unmapped (Signed)
Tara Christensen reports doing well on her new Cosentyx. 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 I recommended she continue using her topicals - we will request refills today.    She started her Cosentyx later than planned, and finished her loading doses yesterday, 9/13. First maintenance dose will be 10/11. We didn't set up delivery yet because it's still several weeks before she'll need more.    [name] Clinical Assessment & Refill Coordination Note    Tara Christensen, DOB: 12-24-1995  Phone: [phone] (home)     All above HIPAA information was verified with patient.     Specialty Medication(s):   Inflammatory Disorders: Cosentyx     Current Outpatient Medications   Medication Sig Dispense Refill   ??? albuterol (PROVENTIL HFA) 90 mcg/actuation inhaler Inhale 2 puffs every six (6) hours as needed for wheezing.     ??? azelastine (ASTELIN) 137 mcg (0.1 %) nasal spray 1 spray by Each Nare route two (2) times a day as needed for rhinitis. Use in each nostril as directed     ??? BECLOMETHASONE DIPROPIONATE (QVAR INHL) Inhale 3 puffs Three (3) times a day as needed.     ??? buPROPion (WELLBUTRIN XL) 150 MG 24 hr tablet TAKE THREE TABLETS BY MOUTH EVERY MORNING     ??? buPROPion (WELLBUTRIN XL) 300 MG 24 hr tablet Take 300 mg by mouth daily.     ??? busPIRone (BUSPAR) 10 MG tablet Take 15 mg by mouth Three (3) times a day.      ??? carvedilol (COREG) 25 MG tablet Take 1 tablet (25 mg total) by mouth 2 (two) times a day with meals. 60 tablet 3   ??? cholecalciferol, vitamin D3, (VITAMIN D3) 2,000 unit cap Take by mouth daily.     ??? clobetasoL (TEMOVATE) 0.05 % external solution Apply topically Two (2) times a day. To the scalp until clear then stop 50 mL 6   ??? dicyclomine (BENTYL) 10 mg capsule Take 10 mg by mouth Three (3) times a day as needed.     ??? divalproex ER (DEPAKOTE ER) 250 MG extended release 24 hr tablet Take 1 pill at bedtime for 1 wk, then take 2 pills every night 60 tablet 6   ??? divalproex ER (DEPAKOTE ER) 250 MG extended release 24 hr tablet Take 3 tablets (750 mg total) by mouth daily. 90 tablet 6   ??? eletriptan (RELPAX) 40 MG tablet Take 1 pill at onset HA, may repeat in 2 hours if needed but no more than 2 in 24 hours 9 tablet 6   ??? eluxadoline (VIBERZI) 100 mg Tab Take 100 mg by mouth Two (2) times a day.     ??? empty container Misc Use as directed with Cosentyx injections 1 each 2   ??? EPINEPHrine (EPIPEN) 0.3 mg/0.3 mL injection Use as directed for life threatening allergic reaction  3   ??? ERGOCALCIFEROL, VITAMIN D2, (VITAMIN D2 ORAL) Take 1.25 mg by mouth.     ??? ferrous sulfate 325 (65 FE) MG tablet Take 325 mg by mouth daily with breakfast.     ??? fexofenadine (ALLEGRA) 180 MG tablet Take 180 mg by mouth daily.     ??? fluticasone (FLONASE) 50 mcg/actuation nasal spray 1 spray by Each Nare route Two (2) times a day.     ??? furosemide (LASIX) 40 MG tablet TAKE ONE TABLET BY MOUTH EVERY DAY  90 tablet 2   ??? gabapentin (NEURONTIN) 400 MG capsule Take 400 mg by mouth Three (3) times a day.     ??? hydrOXYzine (ATARAX) 50 MG tablet TAKE ONE TABLET BY MOUTH EVERY 6 HOURS AS NEEDED FOR ANXIETY     ??? ipratropium-albuterol (DUO-NEB) 0.5-2.5 mg/3 mL nebulizer Inhale 3 mL every six (6) hours as needed.     ??? levETIRAcetam (KEPPRA) 500 MG tablet Take 2 tablets (1,000 mg total) by mouth Two (2) times a day. 120 tablet 5   ??? levothyroxine (SYNTHROID) 25 MCG tablet Take 25 mcg by mouth daily.     ??? lisinopril (PRINIVIL,ZESTRIL) 10 MG tablet TAKE ONE TABLET BY MOUTH EVERY DAY 90 tablet 2   ??? loratadine (CLARITIN) 10 mg tablet TAKE TWO TABLETS BY MOUTH DAILY FOR ITCHING     ??? magnesium oxide (MAG-OX) 400 mg tablet Take 400 mg by mouth daily. 1 AND 1/2 TABLET ONCE A DAY     ??? metroNIDAZOLE (METROCREAM) 0.75 % cream Apply 1 application topically Two (2) times a day.     ??? mometasone-formoterol (DULERA) 200-5 mcg/actuation HFAA Inhale 2 puffs Two (2) times a day.      ??? montelukast (SINGULAIR) 10 mg tablet Take 10 mg by mouth nightly.     ??? multivitamin (TAB-A-VITE/THERAGRAN) per tablet Take 1 tablet by mouth daily.     ??? naproxen (NAPROSYN) 250 MG tablet TAKE ONE TABLET BY MOUTH TWICE DAILY AS NEEDED WITH FOOD     ??? olopatadine (PAZEO) 0.7 % ophthalmic solution Administer 1 drop to both eyes daily.     ??? omeprazole (PRILOSEC) 40 MG capsule TAKE 1 CAPSULE BY MOUTH ONCE DAILY.  11   ??? paliperidone (INVEGA) 6 MG 24 hr tablet Take 6 mg by mouth daily.     ??? paliperidone (INVEGA) 9 MG 24 hr tablet Take 9 mg by mouth nightly.     ??? pantoprazole (PROTONIX) 40 MG tablet Take 40 mg by mouth Two (2) times a day.     ??? potassium chloride (KLOR-CON) 10 MEQ CR tablet TAKE ONE TABLET BY MOUTH EVERY DAY 90 tablet 1   ??? prazosin (MINIPRESS) 1 MG capsule Take 2 mg by mouth nightly.      ??? progesterone (PROMETRIUM) 200 MG capsule Take 200 mg by mouth nightly.     ??? ranitidine (ZANTAC) 300 MG tablet Take 300 mg by mouth nightly.     ??? riboflavin, vitamin B2, 100 mg Tab Take 100 mg by mouth daily.     ??? risperiDONE (RISPERDAL M-TABS) 2 MG disintegrating tablet Take 2 mg by mouth nightly.     ??? secukinumab (COSENTYX PEN, 2 PENS,) 150 mg/mL PnIj injection Inject the contents of 2 pens (300 mg) under the skin once weekly at weeks 0, 1, 2, 3, and 4. Then inject the contents of 2 pens (300 mg) every 4 weeks. 14 mL 2   ??? secukinumab (COSENTYX PEN, 2 PENS,) 150 mg/mL PnIj injection Inject the contents of 2 pens (300mg ) under the skin once weekly for 5 weeks for loading dose. 10 mL 0   ??? sertraline (ZOLOFT) 50 MG tablet Take 50 mg by mouth daily.     ??? topiramate (TOPAMAX ORAL) Take by mouth daily.     ??? traZODone (DESYREL) 50 MG tablet Take 50 mg by mouth nightly.       No current facility-administered medications for this visit.         Changes to medications: Tara Christensen reports no  changes at this time.    Allergies   Allergen Reactions   ??? Adhesive Tape-Silicones Rash   ??? Other Other (See Comments) and Rash     Other reaction(s): Rash  Must wear at least sterling silver jewelry   ??? Tramadol Rash     Pt reports itching as well   ??? Etanercept    ??? Mold Other (See Comments)   ??? Ziprasidone Hcl    ??? Ziprasidone Mesylate    ??? Adhesive Rash       Changes to allergies: No    SPECIALTY MEDICATION ADHERENCE     Cosentyx - 0 left     Specialty medication(s) dose(s) confirmed: Regimen is correct and unchanged.     Are there any concerns with adherence? No    Adherence counseling provided? Not needed    CLINICAL MANAGEMENT AND INTERVENTION      Clinical Benefit Assessment:    Do you feel the medicine is effective or helping your condition? Yes    Clinical Benefit counseling provided? Not needed    Adverse Effects Assessment:    Are you experiencing any side effects? No    Are you experiencing difficulty administering your medicine? No    Quality of Life Assessment:    How many days over the past month did your psoriasis  keep you from your normal activities? For example, brushing your teeth or getting up in the morning. 0    Have you discussed this with your provider? Not needed    Therapy Appropriateness:    Is therapy appropriate? Yes, therapy is appropriate and should be continued    DISEASE/MEDICATION-SPECIFIC INFORMATION      For patients on injectable medications: Patient currently has 0 doses left.  Next injection is scheduled for 10/11.    PATIENT SPECIFIC NEEDS     ? Does the patient have any physical, cognitive, or cultural barriers? No    ? Is the patient high risk? No     ? Does the patient require a Care Management Plan? No     ? Does the patient require physician intervention or other additional services (i.e. nutrition, smoking cessation, social work)? No      SHIPPING     Specialty Medication(s) to be Shipped:   Na/ holding off on shipping at this time    Other medication(s) to be shipped: na     Changes to insurance: No    Delivery Scheduled: Patient declined refill at this time due to too soon. Medication will be delivered via na to the confirmed na address in [facility].    The patient will receive a drug information handout for each medication shipped and additional FDA Medication Guides as required.  Verified that patient has previously received a Conservation officer, historic buildings.    All of the patient's questions and concerns have been addressed.    Lanney Gins   [facility] Specialty Pharmacist

## 2018-10-24 DIAGNOSIS — L400 Psoriasis vulgaris: Secondary | ICD-10-CM

## 2018-10-24 MED ORDER — CLOBETASOL 0.05 % SCALP SOLUTION
Freq: Two times a day (BID) | TOPICAL | 6 refills | 0.00000 days | Status: CP
Start: 2018-10-24 — End: 2019-10-24

## 2018-10-24 NOTE — Unmapped (Signed)
Refill for clobetasol solution sent in for patient.

## 2018-10-25 NOTE — Unmapped (Signed)
Attempt to reach patient per Dr. Linton Rump in box message. A message was left for patient to call and schedule . A recall was scheduled for patient to receive in the mail

## 2018-11-09 NOTE — Unmapped (Signed)
[facility] Refill Coordination Note    Specialty Medication(s) to be Shipped:   Inflammatory Disorders: Cosentyx    Other medication(s) to be shipped: na     Tara Christensen, DOB: 08-22-1995  Phone: [phone] (home)       All above HIPAA information was verified with patient.     Completed refill call assessment today to schedule patient's medication shipment from the [facility] [phone]).       Specialty medication(s) and dose(s) confirmed: Regimen is correct and unchanged.   Changes to medications: Kimmi reports no changes at this time.  Changes to insurance: No  Questions for the pharmacist: No    Confirmed patient received Welcome Packet with first shipment. The patient will receive a drug information handout for each medication shipped and additional FDA Medication Guides as required.       DISEASE/MEDICATION-SPECIFIC INFORMATION        For patients on injectable medications: Patient currently has 0 doses left.  Next injection is scheduled for 101120.    SPECIALTY MEDICATION ADHERENCE     Medication Adherence    Patient reported X missed doses in the last month: 0  Specialty Medication: Cosentyx 150 mg/ml   Patient is on additional specialty medications: No  Any gaps in refill history greater than 2 weeks in the last 3 months: no  Demonstrates understanding of importance of adherence: yes  Informant: patient  Reliability of informant: reliable  Confirmed plan for next specialty medication refill: delivery by pharmacy  Refills needed for supportive medications: not needed              Cosentyx 150 mg/ml . 0 on hand      SHIPPING     Shipping address confirmed in Epic.     Delivery Scheduled: Yes, Expected medication delivery date: 100620.     Medication will be delivered via UPS to the home address in Epic WAM.    Jaquesha Boroff D Alydia Gosser   [facility] Specialty Technician

## 2018-11-13 MED FILL — COSENTYX PEN 300 MG/2 PENS (150 MG/ML) SUBCUTANEOUS: 28 days supply | Qty: 2 | Fill #1

## 2018-11-13 MED FILL — COSENTYX PEN 300 MG/2 PENS (150 MG/ML) SUBCUTANEOUS: 28 days supply | Qty: 2 | Fill #1 | Status: AC

## 2018-11-15 ENCOUNTER — Telehealth: Payer: Self-pay | Admitting: Cardiology

## 2018-11-15 NOTE — Telephone Encounter (Signed)
Wants to make sure she's taking all the right medications

## 2018-11-16 ENCOUNTER — Other Ambulatory Visit: Payer: Self-pay | Admitting: Emergency Medicine

## 2018-11-16 MED ORDER — HYDROCHLOROTHIAZIDE 25 MG PO TABS: 25.0000 mg | ORAL_TABLET | Freq: Every day | ORAL | 1 refills | Status: DC

## 2018-11-16 MED ORDER — LISINOPRIL 40 MG PO TABS: 40.0000 mg | ORAL_TABLET | Freq: Every day | ORAL | 1 refills | Status: DC

## 2018-11-16 NOTE — Telephone Encounter (Signed)
Called patient. She was asking if she was still supposed to take lisinopril and hydrochlorothiazide. I informed her yes, at her last visit this was not changed. She needed refills for both. I refilled them. No further questions.

## 2018-11-18 ENCOUNTER — Observation Stay (HOSPITAL_COMMUNITY)
Admission: AD | Admit: 2018-11-18 | Discharge: 2018-11-19 | Disposition: A | Discharge status: Home / Self Care | Payer: Medicaid Other | Attending: Psychiatry | Admitting: Psychiatry

## 2018-11-18 ENCOUNTER — Encounter (HOSPITAL_COMMUNITY): Payer: Self-pay

## 2018-11-18 DIAGNOSIS — K219 Gastro-esophageal reflux disease without esophagitis: Secondary | ICD-10-CM | POA: Diagnosis not present

## 2018-11-18 DIAGNOSIS — E669 Obesity, unspecified: Secondary | ICD-10-CM | POA: Diagnosis not present

## 2018-11-18 DIAGNOSIS — F329 Major depressive disorder, single episode, unspecified: Secondary | ICD-10-CM

## 2018-11-18 DIAGNOSIS — F411 Generalized anxiety disorder: Secondary | ICD-10-CM

## 2018-11-18 DIAGNOSIS — I10 Essential (primary) hypertension: Secondary | ICD-10-CM | POA: Insufficient documentation

## 2018-11-18 DIAGNOSIS — Z20828 Contact with and (suspected) exposure to other viral communicable diseases: Secondary | ICD-10-CM | POA: Insufficient documentation

## 2018-11-18 DIAGNOSIS — Z79899 Other long term (current) drug therapy: Secondary | ICD-10-CM | POA: Insufficient documentation

## 2018-11-18 DIAGNOSIS — K589 Irritable bowel syndrome without diarrhea: Secondary | ICD-10-CM | POA: Diagnosis not present

## 2018-11-18 DIAGNOSIS — Z818 Family history of other mental and behavioral disorders: Secondary | ICD-10-CM | POA: Insufficient documentation

## 2018-11-18 DIAGNOSIS — F4310 Post-traumatic stress disorder, unspecified: Secondary | ICD-10-CM | POA: Diagnosis not present

## 2018-11-18 DIAGNOSIS — Z8249 Family history of ischemic heart disease and other diseases of the circulatory system: Secondary | ICD-10-CM | POA: Insufficient documentation

## 2018-11-18 DIAGNOSIS — F603 Borderline personality disorder: Secondary | ICD-10-CM | POA: Insufficient documentation

## 2018-11-18 DIAGNOSIS — Z79890 Hormone replacement therapy (postmenopausal): Secondary | ICD-10-CM | POA: Insufficient documentation

## 2018-11-18 DIAGNOSIS — Z885 Allergy status to narcotic agent status: Secondary | ICD-10-CM | POA: Diagnosis not present

## 2018-11-18 DIAGNOSIS — Z7951 Long term (current) use of inhaled steroids: Secondary | ICD-10-CM | POA: Insufficient documentation

## 2018-11-18 DIAGNOSIS — F314 Bipolar disorder, current episode depressed, severe, without psychotic features: Secondary | ICD-10-CM | POA: Diagnosis not present

## 2018-11-18 DIAGNOSIS — Z888 Allergy status to other drugs, medicaments and biological substances status: Secondary | ICD-10-CM | POA: Insufficient documentation

## 2018-11-18 DIAGNOSIS — G4733 Obstructive sleep apnea (adult) (pediatric): Secondary | ICD-10-CM | POA: Insufficient documentation

## 2018-11-18 HISTORY — DX: Major depressive disorder, single episode, unspecified: F32.9

## 2018-11-18 LAB — PREGNANCY, URINE: Preg Test, Ur: NEGATIVE

## 2018-11-18 LAB — RAPID URINE DRUG SCREEN, HOSP PERFORMED
Amphetamines: NOT DETECTED
Barbiturates: NOT DETECTED
Benzodiazepines: NOT DETECTED
Cocaine: NOT DETECTED
Opiates: NOT DETECTED
Tetrahydrocannabinol: NOT DETECTED

## 2018-11-18 LAB — SARS CORONAVIRUS 2 BY RT PCR (HOSPITAL ORDER, PERFORMED IN ~~LOC~~ HOSPITAL LAB): SARS Coronavirus 2: NEGATIVE

## 2018-11-18 MED ORDER — HYDROXYZINE HCL 25 MG PO TABS: 25.0000 mg | ORAL_TABLET | Freq: Three times a day (TID) | ORAL | Status: DC | PRN

## 2018-11-18 MED ORDER — DESOGESTREL-ETHINYL ESTRADIOL 0.15-30 MG-MCG PO TABS: 1.0000 | ORAL_TABLET | Freq: Every day | ORAL | Status: DC

## 2018-11-18 MED ORDER — ALUM & MAG HYDROXIDE-SIMETH 200-200-20 MG/5ML PO SUSP: 30.0000 mL | ORAL | Status: DC | PRN

## 2018-11-18 MED ORDER — DICYCLOMINE HCL 10 MG PO CAPS: 10.0000 mg | ORAL_CAPSULE | Freq: Three times a day (TID) | ORAL | Status: DC | PRN

## 2018-11-18 MED ORDER — LORATADINE 10 MG PO TABS
10.0000 mg | ORAL_TABLET | Freq: Every day | ORAL | Status: DC
  Administered 2018-11-19: 10 mg via ORAL
  Filled 2018-11-18: qty 1

## 2018-11-18 MED ORDER — SERTRALINE HCL 100 MG PO TABS
100.0000 mg | ORAL_TABLET | Freq: Every day | ORAL | Status: DC
  Administered 2018-11-19: 100 mg via ORAL
  Filled 2018-11-18: qty 1

## 2018-11-18 MED ORDER — TRAZODONE HCL 50 MG PO TABS
50.0000 mg | ORAL_TABLET | Freq: Every evening | ORAL | Status: DC | PRN
  Administered 2018-11-18: 50 mg via ORAL
  Filled 2018-11-18: qty 1

## 2018-11-18 MED ORDER — MONTELUKAST SODIUM 10 MG PO TABS
10.0000 mg | ORAL_TABLET | Freq: Every day | ORAL | Status: DC
  Administered 2018-11-18: 10 mg via ORAL
  Filled 2018-11-18: qty 1

## 2018-11-18 MED ORDER — MOMETASONE FURO-FORMOTEROL FUM 200-5 MCG/ACT IN AERO
2.0000 | INHALATION_SPRAY | Freq: Two times a day (BID) | RESPIRATORY_TRACT | Status: DC
  Administered 2018-11-19: 2 via RESPIRATORY_TRACT
  Filled 2018-11-18: qty 8.8

## 2018-11-18 MED ORDER — FERROUS SULFATE 325 (65 FE) MG PO TABS
325.0000 mg | ORAL_TABLET | Freq: Every day | ORAL | Status: DC
  Administered 2018-11-19: 325 mg via ORAL
  Filled 2018-11-18 (×3): qty 1

## 2018-11-18 MED ORDER — CLOBETASOL PROPIONATE 0.05 % EX SOLN: 1.0000 "application " | Freq: Two times a day (BID) | CUTANEOUS | Status: DC

## 2018-11-18 MED ORDER — GUANFACINE HCL ER 2 MG PO TB24
2.0000 mg | ORAL_TABLET | Freq: Every day | ORAL | Status: DC
  Administered 2018-11-19: 2 mg via ORAL
  Filled 2018-11-18 (×3): qty 1

## 2018-11-18 MED ORDER — LISINOPRIL 20 MG PO TABS
40.0000 mg | ORAL_TABLET | Freq: Every day | ORAL | Status: DC
  Administered 2018-11-19: 40 mg via ORAL
  Filled 2018-11-18: qty 2

## 2018-11-18 MED ORDER — PANTOPRAZOLE SODIUM 40 MG PO TBEC
40.0000 mg | DELAYED_RELEASE_TABLET | Freq: Every day | ORAL | Status: DC
  Administered 2018-11-19: 40 mg via ORAL
  Filled 2018-11-18: qty 1

## 2018-11-18 MED ORDER — HYDROCHLOROTHIAZIDE 25 MG PO TABS
25.0000 mg | ORAL_TABLET | Freq: Every day | ORAL | Status: DC
  Administered 2018-11-19: 25 mg via ORAL

## 2018-11-18 MED ORDER — ACETAMINOPHEN 325 MG PO TABS
650.0000 mg | ORAL_TABLET | Freq: Four times a day (QID) | ORAL | Status: DC | PRN
  Administered 2018-11-19: 650 mg via ORAL
  Filled 2018-11-18: qty 2

## 2018-11-18 MED ORDER — SPIRONOLACTONE 25 MG PO TABS
25.0000 mg | ORAL_TABLET | Freq: Every day | ORAL | Status: DC
  Administered 2018-11-19: 25 mg via ORAL
  Filled 2018-11-18 (×3): qty 1

## 2018-11-18 MED ORDER — EPINEPHRINE 0.3 MG/0.3ML IJ SOAJ: 0.3000 mg | Freq: Once | INTRAMUSCULAR | Status: DC | PRN

## 2018-11-18 MED ORDER — LEVOTHYROXINE SODIUM 25 MCG PO TABS
50.0000 ug | ORAL_TABLET | Freq: Every day | ORAL | Status: DC
  Administered 2018-11-19: 50 ug via ORAL
  Filled 2018-11-18: qty 2

## 2018-11-18 MED ORDER — PRENATAL MULTIVITAMIN CH
1.0000 | ORAL_TABLET | Freq: Every day | ORAL | Status: DC
  Administered 2018-11-19: 1 via ORAL
  Filled 2018-11-18 (×2): qty 1

## 2018-11-18 MED ORDER — HYDROXYZINE HCL 50 MG PO TABS
50.0000 mg | ORAL_TABLET | Freq: Four times a day (QID) | ORAL | Status: DC | PRN
  Administered 2018-11-19: 50 mg via ORAL
  Filled 2018-11-18: qty 1

## 2018-11-18 MED ORDER — LITHIUM CARBONATE 300 MG PO CAPS
300.0000 mg | ORAL_CAPSULE | Freq: Three times a day (TID) | ORAL | Status: DC
  Administered 2018-11-19 (×2): 300 mg via ORAL
  Filled 2018-11-18 (×2): qty 1

## 2018-11-18 MED ORDER — HALOPERIDOL 5 MG PO TABS
5.0000 mg | ORAL_TABLET | Freq: Three times a day (TID) | ORAL | Status: DC
  Administered 2018-11-19 (×2): 5 mg via ORAL
  Filled 2018-11-18 (×2): qty 1

## 2018-11-18 MED ORDER — ALBUTEROL SULFATE HFA 108 (90 BASE) MCG/ACT IN AERS: 2.0000 | INHALATION_SPRAY | RESPIRATORY_TRACT | Status: DC | PRN

## 2018-11-18 MED ORDER — MAGNESIUM HYDROXIDE 400 MG/5ML PO SUSP: 30.0000 mL | Freq: Every day | ORAL | Status: DC | PRN
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
## 2018-11-18 NOTE — H&P (Signed)
Behavioral Health Medical Screening Exam  Misty [name] is an 23 y.o. female. Present to [facility] for suicidal ideation. Denied plan or intent. Stated " I told my mother that I wanted her to find me dead." reported "I need to be hospitalized for medication changes." patient is well know to this service.  Stated she was followed by act team however hasnt seen anyone due to [place]. Reported she has been taken medications as directed. Patient she was recently assessed by her psychiatrist and therapist at [facility]. Patient recommend for overnight observation.   Total Time spent with patient: 15 minutes  Psychiatric Specialty Exam: Physical Exam  Constitutional: She appears well-developed.  Psychiatric: She has a normal mood and affect. Thought content normal.    Review of Systems  Psychiatric/Behavioral: Positive for depression and suicidal ideas.  All other systems reviewed and are negative.   Blood pressure (!) 150/96, pulse 99, temperature 98 F (36.7 C), temperature source Temporal, resp. rate 20, SpO2 99 %.There is no height or weight on file to calculate BMI.  General Appearance: Casual  Eye Contact:  Fair  Speech:  Clear and Coherent  Volume:  Normal  Mood:  Anxious and Depressed  Affect:  Congruent  Thought Process:  Coherent  Orientation:  Full (Time, Place, and Person)  Thought Content:  Logical  Suicidal Thoughts:  Yes.  without intent/plan  Homicidal Thoughts:  No  Memory:  Immediate;   Fair Remote;   Fair  Judgement:  Fair  Insight:  Fair  Psychomotor Activity:  Normal  Concentration: Concentration: Fair  Recall:  [employer] of Knowledge:Fair  Language: Fair  Akathisia:  No  Handed:  Right  AIMS (if indicated):     Assets:  Communication Skills Desire for Improvement Resilience Social Support  Sleep:       Musculoskeletal: Strength & Muscle Tone: within normal limits Gait & Station: normal Patient leans: N/A  Blood pressure (!) 150/96, pulse 99, temperature 98 F  (36.7 C), temperature source Temporal, resp. rate 20, SpO2 99 %.  Recommendations: Observation  Based on my evaluation the patient does not appear to have an emergency medical condition.  Derrill Center, NP 11/18/2018, 7:43 PM
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
## 2018-11-19 ENCOUNTER — Other Ambulatory Visit: Payer: Self-pay

## 2018-11-19 DIAGNOSIS — F314 Bipolar disorder, current episode depressed, severe, without psychotic features: Secondary | ICD-10-CM | POA: Diagnosis not present

## 2018-11-19 MED ORDER — DESOGESTREL-ETHINYL ESTRADIOL 0.15-30 MG-MCG PO TABS: 1.0000 | ORAL_TABLET | Freq: Every day | ORAL | Status: DC

## 2018-11-19 NOTE — Progress Notes (Signed)
Patient ID: Misty [name], female   DOB: 1995/11/19, 23 y.o.   MRN: [id number] Patient educated on her discharge instructions and verbalized understanding.  Pt given all of her belongings, verbally contracts for safety outside of the hospital.  Transportation home provided by her father.

## 2018-11-19 NOTE — Plan of Care (Signed)
[place]  Reason for Crisis Plan:  Crisis Stabilization   Plan of Care:  Referral for Inpatient Hospitalization  Family Support:      Current Living Environment:  Living Arrangements: Alone  Insurance:   Hospital Account    Name Acct ID Class Status Primary Coverage   Carden, Mettie [id number] [place]        Guarantor Account (for Hospital Account [id number])    Name Relation to Pt Service Area Active? Acct Type   [place], Martin Majestic Self [facility] Yes [facility]   Address Phone       [address] [place], [place] [address] [phone])          Coverage Information (for Hospital Account [id number])    F/O Payor/Plan Precert #   [facility] MEDICAID/[place] MEDICAID    Subscriber Subscriber #   [place], [place] [id number]   Address Phone   [address] [place], [place] [address] [phone]      Legal Guardian:  Legal Guardian: Other:(Self)  Primary Care Provider:  Alfredo Martinez, PA  Current Outpatient Providers:  [facility]  Psychiatrist:  Name of Psychiatrist: Dr. Otelia Sergeant at [facility]  Counselor/Therapist:  Name of Therapist: Chinita Pester [facility]  Compliant with Medications:  Yes  Additional Information:   Charyl Dancer 10/11/202012:36 AM

## 2018-11-19 NOTE — Discharge Summary (Signed)
Physician Discharge Summary Note  Patient:  Misty [name] is an 23 y.o., female MRN:  [id number] DOB:  28-Nov-1995 Patient phone:  [phone] (home)  Patient address:   [address] [place] [address],  Total Time spent with patient: 45 minutes  Date of Admission:  11/18/2018 Date of Discharge: 11/19/2018  Reason for Admission:  Patient presented for voluntary walk-in assessment on 11/18/2018, complains of suicidal ideations. Patient endorses chronic suicidal thoughts, patient denies intent. Patient states "I have seasonal depression, I get depressed at this time of year." Patient managed by [facility] ACTT team through [facility]. Patient denies homicidal ideations, denies hallucinations. Patient denies alcohol and substance use. Patient endorses taking medications as prescribed, ACTT team assists with medications weekly. Patient denies access to weapons.    With patient's consent, spoke with patient's mother, [name]. Patient's mother verbalizes,  "because of COVID ACT and [facility] (community supports) are not coming out as much, she doesn't want to stay at her apartment so she comes home at least once weekly." Per patient's mother patient, "hasn't gotten used to staying by herself since she moved out a year ago." Patient's mother is willing to pick patient up from [facility] and allow patient to remain at her home for now. With patient's consent, spoke with ACTT team representative , Art Katrinka Blazing. Art confirms that Dann is chronically suicidal, is complaint with medications and making progress with community volunteer work, patient is future-oriented. Art will follow up with patient "once she gets home tonight."    Principal Problem: Bipolar 1 disorder, depressed, severe (HCC) Discharge Diagnoses: Principal Problem:   Bipolar 1 disorder, depressed, severe (HCC) Active Problems:   GAD (generalized anxiety disorder)   Past Psychiatric Generalized anxiety disorder,  Bipolar I Disorder, MDD, Borderline Personality Disorder, PTSD  Past Medical History:  Past Medical History:  Diagnosis Date  . Allergic rhinitis   . Anxiety   . Depression   . GERD (gastroesophageal reflux disease)   . HTN (hypertension) 07/13/12  . IBS (irritable bowel syndrome)   . Obesity   . OSA on CPAP   . Psoriasis     Past Surgical History:  Procedure Laterality Date  . ADENOIDECTOMY    . SINOSCOPY    . TONSILLECTOMY AND ADENOIDECTOMY  Age 89   Deviated septum corrected at same time   Family History:  Family History  Problem Relation Age of Onset  . Depression Mother   . Allergic rhinitis Mother   . Depression Maternal Grandmother   . Diabetes Maternal Grandmother   . Hypertension Maternal Grandmother   . Bipolar disorder Father   . Drug abuse Father   . Hypertension Father    Family Psychiatric  History: Unknown Social History:  Social History   Substance and Sexual Activity  Alcohol Use Yes   Comment: socially     Social History   Substance and Sexual Activity  Drug Use No    Social History   Socioeconomic History  . Marital status: Single    Spouse name: Not on file  . Number of children: Not on file  . Years of education: Not on file  . Highest education level: Not on file  Occupational History  . 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 Financial resource strain: Not on file  . Food insecurity    Worry: Not on file    Inability: Not on file  .  Transportation needs    Medical: Not on file    Non-medical: Not on file  Tobacco Use  . Smoking status: Never Smoker  . Smokeless tobacco: Never Used  Substance and Sexual Activity  . Alcohol use: Yes    Comment: socially  . Drug use: No  . Sexual activity: Not Currently  Lifestyle  . Physical activity    Days per week: Not on file    Minutes per session: Not on file  . Stress: Not on file  Relationships  . Social Wellsite geologistconnections     Talks on phone: Not on file    Gets together: Not on file    Attends religious service: Not on file    Active member of club or organization: Not on file    Attends meetings of clubs or organizations: Not on file    Relationship status: Not on file  Other Topics Concern  . Not on file  Social History Narrative  . Not on file    Hospital Course:  Patient admitted for observation, sleep and appetite at baseline. Home medications restarted.   Physical Findings: AIMS: Facial and Oral Movements Muscles of Facial Expression: None, normal Lips and Perioral Area: None, normal Jaw: None, normal Tongue: None, normal,Extremity Movements Upper (arms, wrists, hands, fingers): None, normal Lower (legs, knees, ankles, toes): None, normal, Trunk Movements Neck, shoulders, hips: None, normal, Overall Severity Severity of abnormal movements (highest score from questions above): None, normal Incapacitation due to abnormal movements: None, normal Patient's awareness of abnormal movements (rate only patient's report): No Awareness, Dental Status Current problems with teeth and/or dentures?: No Does patient usually wear dentures?: No  CIWA:  CIWA-Ar Total: 1 COWS:  COWS Total Score: 0  Musculoskeletal: Strength & Muscle Tone: within normal limits Gait & Station: normal Patient leans: N/A  Psychiatric Specialty Exam: Physical Exam  Nursing note and vitals reviewed. Constitutional: She is oriented to person, place, and time. She appears well-developed.  HENT:  Head: Normocephalic.  Cardiovascular: Normal rate.  Respiratory: Effort normal.  Neurological: She is alert and oriented to person, place, and time.  Psychiatric: Her speech is normal and behavior is normal. Judgment normal. Cognition and memory are normal. She exhibits a depressed mood. She expresses suicidal (Chronic ) ideation.    Review of Systems  Constitutional: Negative.   HENT: Negative.   Eyes: Negative.   Respiratory:  Negative.   Cardiovascular: Negative.   Gastrointestinal: Negative.   Genitourinary: Negative.   Musculoskeletal: Negative.   Skin: Negative.   Neurological: Negative.   Endo/Heme/Allergies: Negative.   Psychiatric/Behavioral: Positive for depression and suicidal ideas.    Blood pressure 140/81, pulse (!) 59, temperature 98 F (36.7 C), temperature source Oral, resp. rate 18, SpO2 97 %.There is no height or weight on file to calculate BMI.  General Appearance: Casual  Eye Contact:  Good  Speech:  Clear and Coherent  Volume:  Normal  Mood:  Depressed  Affect:  Appropriate and Depressed  Thought Process:  Coherent, Goal Directed and Descriptions of Associations: Intact  Orientation:  Full (Time, Place, and Person)  Thought Content:  Logical  Suicidal Thoughts:  Yes.  without intent/plan  Homicidal Thoughts:  No  Memory:  Immediate;   Good Recent;   Good Remote;   Good  Judgement:  Fair  Insight:  Lacking  Psychomotor Activity:  Normal  Concentration:  Concentration: Good and Attention Span: Good  Recall:  Good  Fund of Knowledge:  Fair  Language:  Good  Akathisia:  No  Handed:  Right  AIMS (if indicated):     Assets:  Architect Housing Social Support Vocational/Educational  ADL's:  Intact  Cognition:  WNL  Sleep:           Has this patient used any form of tobacco in the last 30 days? (Cigarettes, Smokeless Tobacco, Cigars, and/or Pipes) Yes, No  Blood Alcohol level:  Lab Results  Component Value Date   [facility]  06/24/2008    <5        LOWEST DETECTABLE LIMIT FOR SERUM ALCOHOL IS 5 mg/dL FOR MEDICAL PURPOSES ONLY    Metabolic Disorder Labs:  Lab Results  Component Value Date   HGBA1C 5.1 04/17/2017   MPG 99.67 04/17/2017   MPG 100 02/25/2008   No results found for: PROLACTIN Lab Results  Component Value Date   CHOL 155 04/17/2017   TRIG 135 04/17/2017   HDL 46 04/17/2017   CHOLHDL 3.4 04/17/2017   VLDL 27  04/17/2017   LDLCALC 82 04/17/2017   LDLCALC 93 03/01/2012    See Psychiatric Specialty Exam and Suicide Risk Assessment completed by Attending Physician prior to discharge.  Discharge destination:  Home  Is patient on multiple antipsychotic therapies at discharge:  No   Has Patient had three or more failed trials of antipsychotic monotherapy by history:  No  Recommended Plan for Multiple Antipsychotic Therapies: NA   Allergies as of 11/19/2018      Reactions   Other Rash   Other reaction(s): Other (See Comments) Other reaction(s): Rash Must wear at least sterling silver jewelry   Ultram [tramadol] Rash   Pt reports itching as well   Etanercept    Molds & Smuts    Ziprasidone    Adhesive [tape] Rash      Medication List    TAKE these medications     Indication  albuterol 108 (90 Base) MCG/ACT inhaler Commonly known as: VENTOLIN HFA Inhale 2 puffs into the lungs every 4 (four) hours as needed for wheezing or shortness of breath.  Indication: Asthma   budesonide-formoterol 160-4.5 MCG/ACT inhaler Commonly known as: SYMBICORT Inhale 1 puff into the lungs 2 (two) times daily.  Indication: Asthma   dicyclomine 10 MG capsule Commonly known as: BENTYL Take 1 capsule (10 mg total) by mouth 3 (three) times daily with meals as needed for spasms.  Indication: Irritable Bowel Syndrome   EPINEPHrine 0.3 mg/0.3 mL Soaj injection Commonly known as: EPI-PEN Use as directed for life threatening allergic reaction  Indication: Life-Threatening Hypersensitivity Reaction   ferrous sulfate 325 (65 FE) MG tablet Take 1 tablet (325 mg total) by mouth daily with breakfast. (May buy from the over the counter): For anemia  Indication: Anemia From Inadequate Iron in the Body   haloperidol 5 MG tablet Commonly known as: HALDOL Take 1 tablet (5 mg total) by mouth 3 (three) times daily. For mood control  Indication: Mood control   hydrochlorothiazide 25 MG tablet Commonly known as:  HYDRODIURIL Take 1 tablet (25 mg total) by mouth daily.  Indication: High Blood Pressure Disorder   hydrOXYzine 50 MG tablet Commonly known as: ATARAX/VISTARIL Take 1 tablet (50 mg total) by mouth every 6 (six) hours as needed for anxiety.  Indication: Feeling Anxious   INTUNIV PO Take 2 mg by mouth daily.  Indication: Attention Deficit Hyperactivity Disorder   levothyroxine 25 MCG tablet Commonly known as: SYNTHROID Take 1 tablet (25 mcg total) by mouth daily at 6 (six) AM. For hormone  replacement What changed: how much to take  Indication: Underactive Thyroid   lisinopril 40 MG tablet Commonly known as: ZESTRIL Take 1 tablet (40 mg total) by mouth daily. For high blood pressure  Indication: High Blood Pressure Disorder   lithium carbonate 300 MG capsule Take 300 mg by mouth 3 (three) times daily with meals.  Indication: Manic-Depression   loratadine 10 MG tablet Commonly known as: CLARITIN Take 1 tablet (10 mg total) by mouth daily. (May buy from over the counter): For allergies  Indication: Perennial Allergic Rhinitis, Hayfever   montelukast 10 MG tablet Commonly known as: SINGULAIR Take 1 tablet (10 mg total) by mouth at bedtime. For asthma  Indication: Asthma   pantoprazole 40 MG tablet Commonly known as: PROTONIX Take 40 mg by mouth daily.  Indication: Gastroesophageal Reflux Disease   RECLIPSEN PO Take 1 tablet by mouth.  Indication: Deficiency of the Hormone Estrogen   sertraline 50 MG tablet Commonly known as: ZOLOFT Take 1 tablet (50 mg total) by mouth daily. For depression What changed: how much to take  Indication: Major Depressive Disorder   spironolactone 25 MG tablet Commonly known as: ALDACTONE Take 1 tablet (25 mg total) by mouth daily.  Indication: High Blood Pressure Disorder        Follow-up recommendations:  Other:  Follow-up with ACTT team.  Comments:  Patient for discharge home.  Signed: Emmaline Kluver, FNP 11/19/2018, 1:32 PM

## 2018-11-19 NOTE — Progress Notes (Signed)
CSW called ACT team at [phone] as requested by Letitia Libra, NP to discuss patient's progress or needs. CSW left voice message requesting return call.  CSW will follow-up.   Netta Neat, MSW, LCSW Clinical Social Work

## 2018-11-19 NOTE — [facility] Suicide Risk Assessment (Cosign Needed)
Suicide Risk Assessment  Discharge Assessment   [facility] Discharge Suicide Risk Assessment   Principal Problem: Bipolar 1 disorder, depressed, severe ([place]) Discharge Diagnoses: Principal Problem:   Bipolar 1 disorder, depressed, severe ([place]) Active Problems:   GAD (generalized anxiety disorder)   Total Time spent with patient: 45 minutes  Patient endorses chronic suicidal thoughts, "I always get depresses, I don't know why." Patient working with ACTT team on Nurse, adult opportunities. Patient states "I would like to do more things (outside of home)."  Patient denies homicidal ideations, denies hallucinations.     Musculoskeletal: Strength & Muscle Tone: within normal limits Gait & Station: normal Patient leans: N/A  Psychiatric Specialty Exam:   Blood pressure 140/81, pulse (!) 59, temperature 98 F (36.7 C), temperature source Oral, resp. rate 18, SpO2 97 %.There is no height or weight on file to calculate BMI.      Eye Contact:  Good  Speech:  Clear and Coherent  Volume:  Normal  Mood:  Depressed  Affect:  Appropriate and Depressed  Thought Process:  Coherent, Goal Directed and Descriptions of Associations: Intact  Orientation:  Full (Time, Place, and Person)  Thought Content:  Logical  Suicidal Thoughts:  Yes.  without intent/plan  Homicidal Thoughts:  No  Memory:  Immediate;   Good Recent;   Good Remote;   Good  Judgement:  Fair  Insight:  Lacking  Psychomotor Activity:  Normal  Concentration:  Concentration: Good and Attention Span: Good  Recall:  Good  Fund of Knowledge:  Fair  Language:  Good  Akathisia:  No  Handed:  Right  AIMS (if indicated):     Assets:  Agricultural consultant Housing Social Support Vocational/Educational  ADL's:  Intact  Cognition:  WNL  Sleep:         Mental Status Per Nursing Assessment::   On Admission:  Suicidal ideation indicated by patient, Suicide plan, Self-harm thoughts  Demographic  Factors:  Adolescent or young adult and Caucasian  Loss Factors: NA  Historical Factors: Prior suicide attempts  Risk Reduction Factors:   Sense of responsibility to family, Living with another person, especially a relative, Positive social support, Positive therapeutic relationship and Positive coping skills or problem solving skills  Continued Clinical Symptoms:  Bipolar Disorder:   Depressive phase  Cognitive Features That Contribute To Risk:  None    Suicide Risk:  Minimal: No identifiable suicidal ideation.  Patients presenting with no risk factors but with morbid ruminations; may be classified as minimal risk based on the severity of the depressive symptoms    Plan Of Care/Follow-up recommendations:  Other:  Follow up with ACTT team. Discharge home.   Emmaline Kluver, FNP 11/19/2018, 1:34 PM

## 2018-11-19 NOTE — Progress Notes (Signed)
Patient ID: Misty [name], female   DOB: 1995/09/25, 23 y.o.   MRN: [id number] Pt presents to this facility as a walk in accompanied by her step father. Pt states that she is here because she has Suicidal Ideation with a plan to cut her wrist or drown herself. Pt has a history of SI attempts by cutting her wrist. Pt is alert and oriented x 4. Currently Pt denies SI, HI, AVH. Pt contracted for safety. Pt uses a CPAP at night while she sleeps. Will continue monitor for safety.

## 2018-11-27 ENCOUNTER — Other Ambulatory Visit: Payer: Self-pay

## 2018-11-27 ENCOUNTER — Ambulatory Visit (INDEPENDENT_AMBULATORY_CARE_PROVIDER_SITE_OTHER): Payer: Medicaid Other | Admitting: Cardiology

## 2018-11-27 ENCOUNTER — Encounter: Payer: Self-pay | Admitting: Cardiology

## 2018-11-27 VITALS — BP 116/64 | HR 94 | Ht 64.0 in | Wt >= 6400 oz

## 2018-11-27 DIAGNOSIS — R000 Tachycardia, unspecified: Secondary | ICD-10-CM

## 2018-11-27 DIAGNOSIS — R002 Palpitations: Secondary | ICD-10-CM | POA: Diagnosis not present

## 2018-11-27 DIAGNOSIS — I10 Essential (primary) hypertension: Secondary | ICD-10-CM

## 2018-11-27 NOTE — Progress Notes (Signed)
Cardiology Office Note:    Date:  11/27/2018   ID:  Misty NissenKristal Huffaker, DOB 1995/02/22, MRN [id number]  PCP:  Nancie NeasLowder, Tiffany N, PA  Cardiologist:  Gypsy Balsamobert , MD    Referring MD: Nancie NeasLowder, Tiffany N, [place]   Chief Complaint  Patient presents with  . Follow-up  Doing well  History of Present Illness:    Misty NissenKristal Erlandson is a 23 y.o. female with essential hypertension, also some psychiatry issue.  Recently being doing well cardiac wise.  Her blood pressure (elevated at home she called number 150/70 however today in the office 116 systolic.  Denies having any chest pain, no tightness no pressure no burning in her chest she does have some lymphedema of lower extremities.  She does have 2 dogs and she walked his dog 4-5 times a day.  Does get some shortness of breath with it but overall seems to be doing well  Past Medical History:  Diagnosis Date  . Allergic rhinitis   . Anxiety   . Depression   . GERD (gastroesophageal reflux disease)   . HTN (hypertension) 07/13/12  . IBS (irritable bowel syndrome)   . Obesity   . OSA on CPAP   . Psoriasis     Past Surgical History:  Procedure Laterality Date  . ADENOIDECTOMY    . SINOSCOPY    . TONSILLECTOMY AND ADENOIDECTOMY  Age 23   Deviated septum corrected at same time    Current Medications: Current Meds  Medication Sig  . albuterol (VENTOLIN HFA) 108 (90 Base) MCG/ACT inhaler Inhale 2 puffs into the lungs every 4 (four) hours as needed for wheezing or shortness of breath.  . Albuterol Sulfate 2.5 MG/0.5ML NEBU Inhale into the lungs.  . benztropine (COGENTIN) 0.5 MG tablet Take 1 tablet by mouth daily.  . budesonide-formoterol (SYMBICORT) 160-4.5 MCG/ACT inhaler Inhale 1 puff into the lungs 2 (two) times daily.  . clobetasol (TEMOVATE) 0.05 % external solution Apply topically.  . Desogestrel-Ethinyl Estradiol (RECLIPSEN PO) Take 1 tablet by mouth.  . EPINEPHrine 0.3 mg/0.3 mL IJ SOAJ injection Use as directed for life threatening  allergic reaction  . ferrous sulfate 325 (65 FE) MG tablet Take 1 tablet (325 mg total) by mouth daily with breakfast. (May buy from the over the counter): For anemia  . Fluocinolone Acetonide Scalp (DERMA-SMOOTHE/FS SCALP) 0.01 % OIL Apply topically.  Marland Kitchen. guanFACINE HCl (INTUNIV PO) Take 2 mg by mouth daily.   . haloperidol (HALDOL) 5 MG tablet Take 1 tablet (5 mg total) by mouth 3 (three) times daily. For mood control  . hydrochlorothiazide (HYDRODIURIL) 25 MG tablet Take 1 tablet (25 mg total) by mouth daily.  . hydrOXYzine (ATARAX/VISTARIL) 50 MG tablet Take 1 tablet (50 mg total) by mouth every 6 (six) hours as needed for anxiety.  Marland Kitchen. levothyroxine (SYNTHROID, LEVOTHROID) 25 MCG tablet Take 1 tablet (25 mcg total) by mouth daily at 6 (six) AM. For hormone replacement (Patient taking differently: Take 50 mcg by mouth daily at 6 (six) AM. For hormone replacement)  . lisinopril (ZESTRIL) 40 MG tablet Take 1 tablet (40 mg total) by mouth daily. For high blood pressure  . lithium carbonate 300 MG capsule Take 300 mg by mouth 3 (three) times daily with meals.  Marland Kitchen. loratadine (CLARITIN) 10 MG tablet Take 1 tablet (10 mg total) by mouth daily. (May buy from over the counter): For allergies  . montelukast (SINGULAIR) 10 MG tablet Take 1 tablet (10 mg total) by mouth at bedtime. For asthma  .  pantoprazole (PROTONIX) 40 MG tablet Take 40 mg by mouth daily.  . sertraline (ZOLOFT) 50 MG tablet Take 1 tablet (50 mg total) by mouth daily. For depression (Patient taking differently: Take 100 mg by mouth daily. For depression)  . spironolactone (ALDACTONE) 25 MG tablet Take 1 tablet (25 mg total) by mouth daily.     Allergies:   Other, Ultram [tramadol], Etanercept, Molds & smuts, Ziprasidone, and Adhesive [tape]   Social History   Socioeconomic History  . Marital status: Single    Spouse name: Not on file  . Number of children: Not on file  . Years of education: Not on file  . Highest education level: Not  on file  Occupational History  . Occupation: STUDENT    Employer: [employer]    Comment: 11th grade [place]  . Financial resource strain: Not on file  . Food insecurity    Worry: Not on file    Inability: Not on file  . Transportation needs    Medical: Not on file    Non-medical: Not on file  Tobacco Use  . Smoking status: Never Smoker  . Smokeless tobacco: Never Used  Substance and Sexual Activity  . Alcohol use: Yes    Comment: socially  . Drug use: No  . Sexual activity: Not Currently  Lifestyle  . Physical activity    Days per week: Not on file    Minutes per session: Not on file  . Stress: Not on file  Relationships  . Social Herbalist on phone: Not on file    Gets together: Not on file    Attends religious service: Not on file    Active member of club or organization: Not on file    Attends meetings of clubs or organizations: Not on file    Relationship status: Not on file  Other Topics Concern  . Not on file  Social History Narrative  . Not on file     Family History: The patient's family history includes Allergic rhinitis in her mother; Bipolar disorder in her father; Depression in her maternal grandmother and mother; Diabetes in her maternal grandmother; Drug abuse in her father; Hypertension in her father and maternal grandmother. ROS:   Please see the history of present illness.    All 14 point review of systems negative except as described per history of present illness  EKGs/Labs/Other Studies Reviewed:      Recent Labs: 12/17/2017: ALT 27; Hemoglobin 14.7; Platelets 277; TSH 3.278 09/12/2018: BUN 12; Creatinine, Ser 0.63; Potassium 3.7; Sodium 141  Recent Lipid Panel    Component Value Date/Time   CHOL 155 04/17/2017 0644   TRIG 135 04/17/2017 0644   HDL 46 04/17/2017 0644   CHOLHDL 3.4 04/17/2017 0644   VLDL 27 04/17/2017 0644   LDLCALC 82 04/17/2017 0644    Physical Exam:    VS:  BP 116/64   Pulse 94   Ht 5'  4" (1.626 m)   Wt (!) 401 lb 9.6 oz (182.2 kg)   SpO2 98%   BMI 68.93 kg/m     Wt Readings from Last 3 Encounters:  11/27/18 (!) 401 lb 9.6 oz (182.2 kg)  12/16/17 (!) 400 lb (181.4 kg)  10/21/17 (!) 404 lb 6.4 oz (183.4 kg)     GEN:  Well nourished, well developed in no acute distress HEENT: Normal NECK: No JVD; No carotid bruits LYMPHATICS: No lymphadenopathy CARDIAC: RRR, no murmurs, no rubs, no  gallops RESPIRATORY:  Clear to auscultation without rales, wheezing or rhonchi  ABDOMEN: Soft, non-tender, non-distended MUSCULOSKELETAL:  No edema; No deformity  SKIN: Warm and dry LOWER EXTREMITIES: no swelling NEUROLOGIC:  Alert and oriented x 3 PSYCHIATRIC:  Normal affect   ASSESSMENT:    1. Sinus tachycardia   2. Palpitations   3. Essential hypertension    PLAN:    In order of problems listed above:  1. Sinus tachycardia.  Her heart rate is mildly elevated today we will continue present monitoring.  Echocardiogram will be done to assess left ventricle ejection fraction and left ventricle hypertrophy 2. Palpitations denies having any lately 3. Essential hypertension blood pressure is well controlled in the office today she will be scheduled to have an echocardiogram to assess left ventricular ejection fraction I will bring her to the office also to check her blood pressure monitor that she will bring it next time when she be in our office.   Medication Adjustments/Labs and Tests Ordered: Current medicines are reviewed at length with the patient today.  Concerns regarding medicines are outlined above.  Orders Placed This Encounter  Procedures  . ECHOCARDIOGRAM COMPLETE   Medication changes: No orders of the defined types were placed in this encounter.   Signed, Georgeanna Lea, MD, [facility] 11/27/2018 11:51 AM    [facility]

## 2018-11-27 NOTE — Patient Instructions (Signed)
Medication Instructions:  Your physician recommends that you continue on your current medications as directed. Please refer to the Current Medication list given to you today. *If you need a refill on your cardiac medications before your next appointment, please call your pharmacy*  Lab Work: None ordered If you have labs (blood work) drawn today and your tests are completely normal, you will receive your results only by: Marland Kitchen MyChart Message (if you have MyChart) OR . A paper copy in the mail If you have any lab test that is abnormal or we need to change your treatment, we will call you to review the results.  Testing/Procedures: Your physician has requested that you have an echocardiogram. Echocardiography is a painless test that uses sound waves to create images of your heart. It provides your doctor with information about the size and shape of your heart and how well your heart's chambers and valves are working. This procedure takes approximately one hour. There are no restrictions for this procedure.  Follow-Up: At [facility], you and your health needs are our priority.  As part of our continuing mission to provide you with exceptional heart care, we have created designated Provider Care Teams.  These Care Teams include your primary Cardiologist (physician) and Advanced Practice Providers (APPs -  Physician Assistants and Nurse Practitioners) who all work together to provide you with the care you need, when you need it.  Your next appointment:   6 weeks  The format for your next appointment:   In Person  Provider:   Jenne Campus, MD

## 2018-12-07 NOTE — Unmapped (Signed)
Spoke to patient and she states that she now goes to [facility] and they switched her to [employer]. She now fills at a different pharmacy.

## 2018-12-11 NOTE — Unmapped (Signed)
This patient has been disenrolled from the [facility] specialty pharmacy services due to a pharmacy change. The patient is now filling at an outside pharmacy. She transitioned care to a non-[place] dermatologist, and they have prescribed an alternative biologic. Tawanna Solo [facility] Specialty Pharmacist

## 2019-01-08 ENCOUNTER — Other Ambulatory Visit: Payer: Medicaid Other

## 2019-01-09 ENCOUNTER — Other Ambulatory Visit: Payer: Self-pay | Admitting: Cardiology

## 2019-01-10 NOTE — Telephone Encounter (Signed)
Spironolactone refill sent to [facility] in [place].

## 2019-01-24 ENCOUNTER — Ambulatory Visit: Payer: Medicaid Other | Admitting: Cardiology

## 2019-02-13 ENCOUNTER — Ambulatory Visit: Payer: Medicaid Other | Admitting: Cardiology

## 2019-02-27 ENCOUNTER — Ambulatory Visit (INDEPENDENT_AMBULATORY_CARE_PROVIDER_SITE_OTHER): Payer: Medicaid Other

## 2019-02-27 ENCOUNTER — Ambulatory Visit (INDEPENDENT_AMBULATORY_CARE_PROVIDER_SITE_OTHER): Payer: Medicaid Other | Admitting: Cardiology

## 2019-02-27 ENCOUNTER — Other Ambulatory Visit: Payer: Self-pay

## 2019-02-27 ENCOUNTER — Encounter: Payer: Self-pay | Admitting: Cardiology

## 2019-02-27 VITALS — BP 112/82 | HR 77 | Ht 64.0 in | Wt >= 6400 oz

## 2019-02-27 DIAGNOSIS — R000 Tachycardia, unspecified: Secondary | ICD-10-CM

## 2019-02-27 DIAGNOSIS — I10 Essential (primary) hypertension: Secondary | ICD-10-CM | POA: Diagnosis not present

## 2019-02-27 DIAGNOSIS — F314 Bipolar disorder, current episode depressed, severe, without psychotic features: Secondary | ICD-10-CM

## 2019-02-27 DIAGNOSIS — E8881 Metabolic syndrome: Secondary | ICD-10-CM

## 2019-02-27 DIAGNOSIS — R002 Palpitations: Secondary | ICD-10-CM

## 2019-02-27 NOTE — Progress Notes (Addendum)
Complete echocardiogram has been performed. Contrast was needed, IV access could not be obtained.  Jimmy Mackson Botz RDCS, RVT

## 2019-02-27 NOTE — Patient Instructions (Signed)

## 2019-02-27 NOTE — Progress Notes (Signed)
Cardiology Office Note:    Date:  02/27/2019   ID:  [name], DOB 04-27-1995, MRN [id number]  PCP:  Alfredo Martinez, PA  Cardiologist:  Jenne Campus, MD    Referring MD: Alfredo Martinez, [place]   Chief Complaint  Patient presents with  . Follow-up    History of Present Illness:    Misty [name] is a 24 y.o. female with past medical history significant for morbid obesity, bipolar disorder, atypical chest pain, dyspnea on exertion.  I have been following for years we never find any significant heart trouble.  Today she had echocardiogram done to assess her left ventricle ejection fraction preliminary report is good.  Overall she is doing fair.  She ended up being in the hospital few days ago because of atypical chest pain all biochemical markers were negative.  She walks on the regular basis she got 2 dogs and she walks with them doing well.  Past Medical History:  Diagnosis Date  . Allergic rhinitis   . Anxiety   . Depression   . GERD (gastroesophageal reflux disease)   . HTN (hypertension) 07/13/12  . IBS (irritable bowel syndrome)   . Obesity   . OSA on CPAP   . Psoriasis     Past Surgical History:  Procedure Laterality Date  . ADENOIDECTOMY    . SINOSCOPY    . TONSILLECTOMY AND ADENOIDECTOMY  Age 33   Deviated septum corrected at same time    Current Medications: Current Meds  Medication Sig  . albuterol (VENTOLIN HFA) 108 (90 Base) MCG/ACT inhaler Inhale 2 puffs into the lungs every 4 (four) hours as needed for wheezing or shortness of breath.  . Albuterol Sulfate 2.5 MG/0.5ML NEBU Inhale into the lungs.  . benztropine (COGENTIN) 0.5 MG tablet Take 1 tablet by mouth daily.  . budesonide-formoterol (SYMBICORT) 160-4.5 MCG/ACT inhaler Inhale 1 puff into the lungs 2 (two) times daily.  . busPIRone (BUSPAR) 5 MG tablet Take by mouth.  . Desogestrel-Ethinyl Estradiol (RECLIPSEN PO) Take 1 tablet by mouth.  . ferrous sulfate 325 (65 FE) MG tablet Take 1 tablet  (325 mg total) by mouth daily with breakfast. (May buy from the over the counter): For anemia  . guanFACINE HCl (INTUNIV PO) Take 2 mg by mouth daily.   . haloperidol (HALDOL) 5 MG tablet Take 1 tablet (5 mg total) by mouth 3 (three) times daily. For mood control  . hydrochlorothiazide (HYDRODIURIL) 25 MG tablet Take 1 tablet (25 mg total) by mouth daily.  . hydrOXYzine (ATARAX/VISTARIL) 50 MG tablet Take 1 tablet (50 mg total) by mouth every 6 (six) hours as needed for anxiety.  Marland Kitchen levothyroxine (SYNTHROID) 50 MCG tablet Take 50 mcg by mouth daily before breakfast.  . lisinopril (ZESTRIL) 40 MG tablet Take 1 tablet (40 mg total) by mouth daily. For high blood pressure  . lithium carbonate 300 MG capsule Take 300 mg by mouth 3 (three) times daily with meals.  Marland Kitchen loratadine (CLARITIN) 10 MG tablet Take 1 tablet (10 mg total) by mouth daily. (May buy from over the counter): For allergies  . montelukast (SINGULAIR) 10 MG tablet Take 1 tablet (10 mg total) by mouth at bedtime. For asthma  . pantoprazole (PROTONIX) 40 MG tablet Take 40 mg by mouth daily.  . sertraline (ZOLOFT) 100 MG tablet Take 100 mg by mouth daily.  Marland Kitchen spironolactone (ALDACTONE) 25 MG tablet TAKE ONE TABLET BY MOUTH ONCE DAILY     Allergies:   Other, Ultram [tramadol],  Etanercept, Molds & smuts, Ziprasidone, and Adhesive [tape]   Social History   Socioeconomic History  . Marital status: Single    Spouse name: Not on file  . Number of children: Not on file  . Years of education: Not on file  . Highest education level: Not on file  Occupational History  . Occupation: STUDENT    Employer: [employer]    Comment: 11th grade [employer]  Tobacco Use  . Smoking status: Never Smoker  . Smokeless tobacco: Never Used  Substance and Sexual Activity  . Alcohol use: Yes    Comment: socially  . Drug use: No  . Sexual activity: Not Currently  Other Topics Concern  . Not on file  Social History Narrative  . Not on file   Social  Determinants of Health   Financial Resource Strain:   . Difficulty of Paying Living Expenses: Not on file  Food Insecurity:   . Worried About Programme researcher, broadcasting/film/video in the Last Year: Not on file  . Ran Out of Food in the Last Year: Not on file  Transportation Needs:   . Lack of Transportation (Medical): Not on file  . Lack of Transportation (Non-Medical): Not on file  Physical Activity:   . Days of Exercise per Week: Not on file  . Minutes of Exercise per Session: Not on file  Stress:   . Feeling of Stress : Not on file  Social Connections:   . Frequency of Communication with Friends and Family: Not on file  . Frequency of Social Gatherings with Friends and Family: Not on file  . Attends Religious Services: Not on file  . Active Member of Clubs or Organizations: Not on file  . Attends Banker Meetings: Not on file  . Marital Status: Not on file     Family History: The patient's family history includes Allergic rhinitis in her mother; Bipolar disorder in her father; Depression in her maternal grandmother and mother; Diabetes in her maternal grandmother; Drug abuse in her father; Hypertension in her father and maternal grandmother. ROS:   Please see the history of present illness.    All 14 point review of systems negative except as described per history of present illness  EKGs/Labs/Other Studies Reviewed:      Recent Labs: 09/12/2018: BUN 12; Creatinine, Ser 0.63; Potassium 3.7; Sodium 141  Recent Lipid Panel    Component Value Date/Time   CHOL 155 04/17/2017 0644   TRIG 135 04/17/2017 0644   HDL 46 04/17/2017 0644   CHOLHDL 3.4 04/17/2017 0644   VLDL 27 04/17/2017 0644   LDLCALC 82 04/17/2017 0644    Physical Exam:    VS:  BP 112/82 (BP Location: Left Arm, Patient Position: Sitting, Cuff Size: Large)   Pulse 77   Ht 5\' 4"  (1.626 m)   Wt (!) 415 lb (188.2 kg)   SpO2 100%   BMI 71.23 kg/m     Wt Readings from Last 3 Encounters:  02/27/19 (!) 415 lb  (188.2 kg)  11/27/18 (!) 401 lb 9.6 oz (182.2 kg)  10/21/17 (!) 404 lb 6.4 oz (183.4 kg)     GEN:  Well nourished, well developed in no acute distress HEENT: Normal NECK: No JVD; No carotid bruits LYMPHATICS: No lymphadenopathy CARDIAC: RRR, no murmurs, no rubs, no gallops RESPIRATORY:  Clear to auscultation without rales, wheezing or rhonchi  ABDOMEN: Soft, non-tender, non-distended MUSCULOSKELETAL:  No edema; No deformity  SKIN: Warm and dry LOWER EXTREMITIES: no swelling  NEUROLOGIC:  Alert and oriented x 3 PSYCHIATRIC:  Normal affect   ASSESSMENT:    1. Essential hypertension   2. Sinus tachycardia   3. Metabolic syndrome   4. Bipolar 1 disorder, depressed, severe (HCC)    PLAN:    In order of problems listed above:  1. Sinus tachycardia.  Today doing well.  Awaiting results of the echocardiogram preliminary report was normal. 2. Metabolic syndrome she understands the problems try to work on weight loss.  So far not successfully. 3. Bipolar disorder followed by internal medicine team and psychiatry.   Medication Adjustments/Labs and Tests Ordered: Current medicines are reviewed at length with the patient today.  Concerns regarding medicines are outlined above.  No orders of the defined types were placed in this encounter.  Medication changes: No orders of the defined types were placed in this encounter.   Signed, Georgeanna Lea, MD, [facility] 02/27/2019 9:52 AM    [facility]

## 2019-03-07 ENCOUNTER — Other Ambulatory Visit: Payer: Self-pay | Admitting: Cardiology

## 2019-03-15 ENCOUNTER — Inpatient Hospital Stay
Admission: EM | Admit: 2019-03-15 | Discharge: 2019-03-23 | DRG: 885 | Disposition: A | Discharge status: Home / Self Care | Payer: Medicaid Other | Source: Intra-hospital | Attending: Psychiatry | Admitting: Psychiatry

## 2019-03-15 DIAGNOSIS — Z6841 Body Mass Index (BMI) 40.0 and over, adult: Secondary | ICD-10-CM | POA: Diagnosis not present

## 2019-03-15 DIAGNOSIS — F39 Unspecified mood [affective] disorder: Secondary | ICD-10-CM | POA: Diagnosis present

## 2019-03-15 DIAGNOSIS — Z7289 Other problems related to lifestyle: Secondary | ICD-10-CM

## 2019-03-15 DIAGNOSIS — Z91018 Allergy to other foods: Secondary | ICD-10-CM

## 2019-03-15 DIAGNOSIS — J309 Allergic rhinitis, unspecified: Secondary | ICD-10-CM | POA: Diagnosis present

## 2019-03-15 DIAGNOSIS — R443 Hallucinations, unspecified: Secondary | ICD-10-CM | POA: Diagnosis not present

## 2019-03-15 DIAGNOSIS — Z885 Allergy status to narcotic agent status: Secondary | ICD-10-CM | POA: Diagnosis not present

## 2019-03-15 DIAGNOSIS — K219 Gastro-esophageal reflux disease without esophagitis: Secondary | ICD-10-CM | POA: Diagnosis present

## 2019-03-15 DIAGNOSIS — E039 Hypothyroidism, unspecified: Secondary | ICD-10-CM | POA: Diagnosis present

## 2019-03-15 DIAGNOSIS — Z7951 Long term (current) use of inhaled steroids: Secondary | ICD-10-CM

## 2019-03-15 DIAGNOSIS — Z818 Family history of other mental and behavioral disorders: Secondary | ICD-10-CM

## 2019-03-15 DIAGNOSIS — Z79890 Hormone replacement therapy (postmenopausal): Secondary | ICD-10-CM

## 2019-03-15 DIAGNOSIS — G4710 Hypersomnia, unspecified: Secondary | ICD-10-CM | POA: Diagnosis present

## 2019-03-15 DIAGNOSIS — G4733 Obstructive sleep apnea (adult) (pediatric): Secondary | ICD-10-CM | POA: Diagnosis present

## 2019-03-15 DIAGNOSIS — Z8249 Family history of ischemic heart disease and other diseases of the circulatory system: Secondary | ICD-10-CM | POA: Diagnosis not present

## 2019-03-15 DIAGNOSIS — F515 Nightmare disorder: Secondary | ICD-10-CM | POA: Diagnosis present

## 2019-03-15 DIAGNOSIS — F332 Major depressive disorder, recurrent severe without psychotic features: Secondary | ICD-10-CM

## 2019-03-15 DIAGNOSIS — F4310 Post-traumatic stress disorder, unspecified: Secondary | ICD-10-CM | POA: Diagnosis present

## 2019-03-15 DIAGNOSIS — I10 Essential (primary) hypertension: Secondary | ICD-10-CM | POA: Diagnosis present

## 2019-03-15 DIAGNOSIS — Z888 Allergy status to other drugs, medicaments and biological substances status: Secondary | ICD-10-CM | POA: Diagnosis not present

## 2019-03-15 DIAGNOSIS — L409 Psoriasis, unspecified: Secondary | ICD-10-CM | POA: Diagnosis present

## 2019-03-15 DIAGNOSIS — Z79899 Other long term (current) drug therapy: Secondary | ICD-10-CM

## 2019-03-15 DIAGNOSIS — J45909 Unspecified asthma, uncomplicated: Secondary | ICD-10-CM | POA: Diagnosis present

## 2019-03-15 DIAGNOSIS — K589 Irritable bowel syndrome without diarrhea: Secondary | ICD-10-CM | POA: Diagnosis present

## 2019-03-15 DIAGNOSIS — F411 Generalized anxiety disorder: Secondary | ICD-10-CM | POA: Diagnosis present

## 2019-03-15 DIAGNOSIS — R45851 Suicidal ideations: Secondary | ICD-10-CM | POA: Diagnosis present

## 2019-03-15 HISTORY — DX: Major depressive disorder, recurrent severe without psychotic features: F33.2

## 2019-03-15 MED ORDER — HYDROXYZINE HCL 50 MG PO TABS
50.0000 mg | ORAL_TABLET | Freq: Once | ORAL | Status: AC
  Administered 2019-03-16: 50 mg via ORAL
  Filled 2019-03-15: qty 1

## 2019-03-15 MED ORDER — ACETAMINOPHEN 325 MG PO TABS
650.0000 mg | ORAL_TABLET | Freq: Four times a day (QID) | ORAL | Status: DC | PRN
  Administered 2019-03-16 – 2019-03-22 (×9): 650 mg via ORAL
  Filled 2019-03-15 (×9): qty 2

## 2019-03-15 MED ORDER — ALUM & MAG HYDROXIDE-SIMETH 200-200-20 MG/5ML PO SUSP: 30.0000 mL | ORAL | Status: DC | PRN

## 2019-03-15 MED ORDER — MAGNESIUM HYDROXIDE 400 MG/5ML PO SUSP: 30.0000 mL | Freq: Every day | ORAL | Status: DC | PRN

## 2019-03-15 NOTE — BH Assessment (Signed)
Patient has been accepted to [facility].  Accepted by Nurse Practitioner [facility], on the behalf of Dr. Toni Amend. Attending  Physician will be Dr. Toni Amend.  Patient has been assigned to room 319, by [facility] Charge Nurse Demetria   Call report to [phone].  Representative/Transfer Coordinator is Warden/ranger Patient pre-admitted by [facility] Patient Access Donella Stade.,)   [facility] Staff Debby Bud, Disposition Social Worker) made aware of acceptance.
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
## 2019-03-16 ENCOUNTER — Other Ambulatory Visit: Payer: Self-pay

## 2019-03-16 ENCOUNTER — Encounter: Payer: Self-pay | Admitting: Behavioral Health

## 2019-03-16 DIAGNOSIS — F332 Major depressive disorder, recurrent severe without psychotic features: Principal | ICD-10-CM

## 2019-03-16 MED ORDER — SPIRONOLACTONE 25 MG PO TABS
25.0000 mg | ORAL_TABLET | Freq: Every day | ORAL | Status: DC
  Administered 2019-03-16 – 2019-03-17 (×2): 25 mg via ORAL
  Filled 2019-03-16 (×2): qty 1

## 2019-03-16 MED ORDER — BENZTROPINE MESYLATE 1 MG PO TABS
0.5000 mg | ORAL_TABLET | Freq: Every day | ORAL | Status: DC
  Administered 2019-03-16 – 2019-03-17 (×2): 0.5 mg via ORAL
  Filled 2019-03-16 (×2): qty 1

## 2019-03-16 MED ORDER — LISINOPRIL 20 MG PO TABS
40.0000 mg | ORAL_TABLET | Freq: Every day | ORAL | Status: DC
  Administered 2019-03-16: 17:00:00 40 mg via ORAL
  Filled 2019-03-16 (×2): qty 2

## 2019-03-16 MED ORDER — GUANFACINE HCL ER 1 MG PO TB24
2.0000 mg | ORAL_TABLET | Freq: Every day | ORAL | Status: DC
  Administered 2019-03-16 – 2019-03-20 (×5): 2 mg via ORAL
  Filled 2019-03-16 (×5): qty 2

## 2019-03-16 MED ORDER — BUSPIRONE HCL 5 MG PO TABS
5.0000 mg | ORAL_TABLET | Freq: Every day | ORAL | Status: DC
  Administered 2019-03-16 – 2019-03-17 (×2): 5 mg via ORAL
  Filled 2019-03-16 (×2): qty 1

## 2019-03-16 MED ORDER — HYDROCHLOROTHIAZIDE 25 MG PO TABS
25.0000 mg | ORAL_TABLET | Freq: Every day | ORAL | Status: DC
  Administered 2019-03-16: 17:00:00 25 mg via ORAL
  Filled 2019-03-16 (×2): qty 1

## 2019-03-16 MED ORDER — MONTELUKAST SODIUM 10 MG PO TABS
10.0000 mg | ORAL_TABLET | Freq: Every day | ORAL | Status: DC
  Administered 2019-03-16 – 2019-03-22 (×7): 10 mg via ORAL
  Filled 2019-03-16 (×7): qty 1

## 2019-03-16 MED ORDER — LORATADINE 10 MG PO TABS
10.0000 mg | ORAL_TABLET | Freq: Every day | ORAL | Status: DC
  Administered 2019-03-16 – 2019-03-23 (×8): 10 mg via ORAL
  Filled 2019-03-16 (×8): qty 1

## 2019-03-16 MED ORDER — SERTRALINE HCL 100 MG PO TABS
100.0000 mg | ORAL_TABLET | Freq: Every day | ORAL | Status: DC
  Administered 2019-03-16 – 2019-03-17 (×2): 100 mg via ORAL
  Filled 2019-03-16 (×2): qty 1

## 2019-03-16 MED ORDER — HYDROXYZINE HCL 50 MG PO TABS
50.0000 mg | ORAL_TABLET | Freq: Four times a day (QID) | ORAL | Status: DC | PRN
  Administered 2019-03-16 – 2019-03-21 (×9): 50 mg via ORAL
  Filled 2019-03-16 (×8): qty 1

## 2019-03-16 MED ORDER — HALOPERIDOL 5 MG PO TABS
5.0000 mg | ORAL_TABLET | Freq: Three times a day (TID) | ORAL | Status: DC
  Administered 2019-03-16 – 2019-03-17 (×3): 5 mg via ORAL
  Filled 2019-03-16 (×3): qty 1

## 2019-03-16 MED ORDER — ALBUTEROL SULFATE (2.5 MG/3ML) 0.083% IN NEBU: 2.5000 mg | INHALATION_SOLUTION | Freq: Four times a day (QID) | RESPIRATORY_TRACT | Status: DC | PRN

## 2019-03-16 MED ORDER — LITHIUM CARBONATE 300 MG PO CAPS
300.0000 mg | ORAL_CAPSULE | Freq: Three times a day (TID) | ORAL | Status: DC
  Administered 2019-03-16 – 2019-03-18 (×5): 300 mg via ORAL
  Filled 2019-03-16 (×5): qty 1

## 2019-03-16 MED ORDER — LEVOTHYROXINE SODIUM 50 MCG PO TABS
50.0000 ug | ORAL_TABLET | Freq: Every day | ORAL | Status: DC
  Administered 2019-03-17 – 2019-03-23 (×7): 50 ug via ORAL
  Filled 2019-03-16 (×7): qty 1

## 2019-03-16 NOTE — BHH Group Notes (Addendum)
LCSW Group Therapy Note  03/16/2019 2:24 PM  Type of Therapy/Topic:  Group Therapy:  Emotion Regulation  Participation Level:  Minimal   Description of Group:   The purpose of this group is to assist patients in learning to regulate negative emotions and experience positive emotions. Patients will be guided to discuss ways in which they have been vulnerable to their negative emotions. These vulnerabilities will be juxtaposed with experiences of positive emotions or situations, and patients will be challenged to use positive emotions to combat negative ones. Special emphasis will be placed on coping with negative emotions in conflict situations, and patients will process healthy conflict resolution skills.  Therapeutic Goals: 1. Patient will identify two positive emotions or experiences to reflect on in order to balance out negative emotions 2. Patient will label two or more emotions that they find the most difficult to experience 3. Patient will demonstrate positive conflict resolution skills through discussion and/or role plays  Summary of Patient Progress: Pt was present in group and engaged in the discussion about things that make her happy and how she has made others happy before.    Therapeutic Modalities:   Cognitive Behavioral Therapy Feelings Identification Dialectical Behavioral Therapy   Iris Pert, MSW, LCSW Clinical Social Work 03/16/2019 2:24 PM
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
## 2019-03-16 NOTE — Plan of Care (Signed)
Patient new to the unit, hasn't had time to progress  Problem: Education: Goal: Knowledge of [facility] Education information/materials will improve Outcome: Not Progressing Goal: Emotional status will improve Outcome: Not Progressing Goal: Mental status will improve Outcome: Not Progressing Goal: Verbalization of understanding the information provided will improve Outcome: Not Progressing   Problem: Safety: Goal: Periods of time without injury will increase Outcome: Not Progressing   Problem: Education: Goal: Ability to make informed decisions regarding treatment will improve Outcome: Not Progressing   Problem: Medication: Goal: Compliance with prescribed medication regimen will improve Outcome: Not Progressing   

## 2019-03-16 NOTE — [facility] Suicide Risk Assessment (Signed)
[facility] Admission Suicide Risk Assessment   Nursing information obtained from:  Patient Demographic factors:  NA Current Mental Status:  Suicidal ideation indicated by others Loss Factors:  Loss of significant relationship(Death of granfather) Historical Factors:  Impulsivity Risk Reduction Factors:  Positive therapeutic relationship, Sense of responsibility to family  Total Time spent with patient: 1 hour Principal Problem: MDD (major depressive disorder), recurrent episode, severe ([place]) Diagnosis:  Principal Problem:   MDD (major depressive disorder), recurrent episode, severe ([place]) Active Problems:   GAD (generalized anxiety disorder)   Obstructive sleep apnea   GERD (gastroesophageal reflux disease)   H/O ASTHMA   Hypertension   Morbid obesity ([place])   Posttraumatic stress disorder  Subjective Data: Patient seen chart reviewed.  24 year old woman with chronic depression.  Reported to her therapist that she was thinking of shooting herself.  Patient reports multiple symptoms of depression and fatigue worse for the last 2 months.  Denies any acute psychotic symptoms.  Still having suicidal thoughts with no intent to act on it in the hospital.  Continued Clinical Symptoms:  Alcohol Use Disorder Identification Test Final Score (AUDIT): 0 The "Alcohol Use Disorders Identification Test", Guidelines for Use in Primary Care, Second Edition.  World Pharmacologist [facility]). Score between 0-7:  no or low risk or alcohol related problems. Score between 8-15:  moderate risk of alcohol related problems. Score between 16-19:  high risk of alcohol related problems. Score 20 or above:  warrants further diagnostic evaluation for alcohol dependence and treatment.   CLINICAL FACTORS:   Depression:   Anhedonia Hopelessness   Musculoskeletal: Strength & Muscle Tone: decreased Gait & Station: normal Patient leans: N/A  Psychiatric Specialty Exam: Physical Exam  Nursing note and vitals  reviewed. Constitutional: She appears well-developed and well-nourished.  HENT:  Head: Normocephalic and atraumatic.  Eyes: Pupils are equal, round, and reactive to light. Conjunctivae are normal.  Cardiovascular: Regular rhythm and normal heart sounds.  Respiratory: Effort normal.  GI: Soft.  Musculoskeletal:        General: Normal range of motion.     Cervical back: Normal range of motion.  Neurological: She is alert.  Skin: Skin is warm and dry.  Psychiatric: Her speech is normal and behavior is normal. Judgment normal. Her affect is blunt. Cognition and memory are normal. She exhibits a depressed mood. She expresses suicidal ideation. She expresses no suicidal plans.    Review of Systems  Constitutional: Negative.   HENT: Negative.   Eyes: Negative.   Respiratory: Negative.   Cardiovascular: Negative.   Gastrointestinal: Negative.   Musculoskeletal: Negative.   Skin: Negative.   Neurological: Negative.   Psychiatric/Behavioral: Positive for decreased concentration, dysphoric mood and suicidal ideas. The patient is nervous/anxious.     Blood pressure 118/66, pulse 78, temperature 98.6 F (37 C), temperature source Oral, resp. rate 17, height 5\' 4"  (1.626 m), weight (!) 188.2 kg, SpO2 97 %.Body mass index is 71.23 kg/m.  General Appearance: Casual  Eye Contact:  Good  Speech:  Clear and Coherent  Volume:  Normal  Mood:  Depressed  Affect:  Congruent  Thought Process:  Goal Directed  Orientation:  Full (Time, Place, and Person)  Thought Content:  Logical  Suicidal Thoughts:  Yes.  without intent/plan  Homicidal Thoughts:  No  Memory:  Immediate;   Fair Recent;   Fair Remote;   Fair  Judgement:  Fair  Insight:  Fair  Psychomotor Activity:  Decreased  Concentration:  Concentration: Fair  Recall:  Fair  Fund of Knowledge:  Fair  Language:  Fair  Akathisia:  No  Handed:  Right  AIMS (if indicated):     Assets:  Desire for Improvement Housing Resilience  ADL's:   Intact  Cognition:  WNL  Sleep:  Number of Hours: 5.75      COGNITIVE FEATURES THAT CONTRIBUTE TO RISK:  None    SUICIDE RISK:   Minimal: No identifiable suicidal ideation.  Patients presenting with no risk factors but with morbid ruminations; may be classified as minimal risk based on the severity of the depressive symptoms  PLAN OF CARE: 15-minute checks.  Continue current medicine.  Engage in individual and group therapy.  Encourage patient to work on Pharmacologist.  Reassess dangerousness prior to discharge  I certify that inpatient services furnished can reasonably be expected to improve the patient's condition.   Mordecai Rasmussen, MD 03/16/2019, 3:34 PM
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
## 2019-03-16 NOTE — Progress Notes (Signed)
Patient presents with sad, flat affect. Endorses passive SI, but contracts for safety. Visible in milieu, socializing with peers. Denies HI. Reports depression 8/10. Pt noted playing cards with female peer. Eating meals well. Attends group. Encouragement and support offered. Safety checks maintained. Pt receptive and remains safe on unit with q 15 min checks.

## 2019-03-16 NOTE — Progress Notes (Signed)
Recreation Therapy Notes  INPATIENT RECREATION THERAPY ASSESSMENT  Patient Details Name: Misty [name] MRN: [id number] DOB: 08-06-1995 Today's Date: 03/16/2019       Information Obtained From: Patient  Able to Participate in Assessment/Interview: Yes  Patient Presentation: Responsive  Reason for Admission (Per Patient): Active Symptoms, Suicidal Ideation, Suicide Attempt  Patient Stressors: Family, Relationship, Death  Coping Skills:   Sports, Music  Leisure Interests (2+):  Sports - Basketball, Sports - Conservation officer, historic buildings of Recreation/Participation: Pharmacist, community Resources:     [employer]:     Current Use:    If no, Barriers?:    Expressed Interest in [employer] Information:    [employer] of Residence:  Arts administrator  Patient Main Form of Transportation: Therapist, music  Patient Strengths:  Caring  Patient Identified Areas of Improvement:  N/A  Patient Goal for Hospitalization:  Not feel so depressed  Current SI (including self-harm):  Yes(Contract for safety, No plan)  Current HI:  No  Current AVH: No  Staff Intervention Plan: Group Attendance, Collaborate with Interdisciplinary Treatment Team  Consent to Intern Participation: N/A  Misty [name] 03/16/2019, 1:30 PM

## 2019-03-16 NOTE — BHH Suicide Risk Assessment (Signed)
[facility] INPATIENT:  Family/Significant Other Suicide Prevention Education  Suicide Prevention Education:  Contact Attempts: Denise Washburn, mother [phone] has been identified by the patient as the family member/significant other with whom the patient will be residing, and identified as the person(s) who will aid the patient in the event of a mental health crisis.  With written consent from the patient, two attempts were made to provide suicide prevention education, prior to and/or following the patient's discharge.  We were unsuccessful in providing suicide prevention education.  A suicide education pamphlet was given to the patient to share with family/significant other.  Date and time of first attempt: 03/16/19 at 9:37am Date and time of second attempt:    CSW left a HIPAA compliant voicemail requesting a call back.   Charlann Lange Cainen Burnham MSW LCSW 03/16/2019, 9:37 AM

## 2019-03-16 NOTE — Tx Team (Signed)
Initial Treatment Plan 03/16/2019 6:13 AM Misty [name] [id number]:[id number]    PATIENT STRESSORS: Health problems Marital or family conflict Medication change or noncompliance   PATIENT STRENGTHS: Ability for insight General fund of knowledge Motivation for treatment/growth Supportive family/friends   PATIENT IDENTIFIED PROBLEMS: Depression     Anxiety                  DISCHARGE CRITERIA:  Improved stabilization in mood, thinking, and/or behavior Medical problems require only outpatient monitoring  PRELIMINARY DISCHARGE PLAN: Outpatient therapy  PATIENT/FAMILY INVOLVEMENT: This treatment plan has been presented to and reviewed with the patient, Misty [name], The patient and family have been given the opportunity to ask questions and make suggestions.  [name], RN 03/16/2019, 6:13 AM

## 2019-03-16 NOTE — Progress Notes (Signed)
Recreation Therapy Notes  Date: 03/16/2019  Time: 9:30 am  Location: Craft room  Behavioral response: Appropriate  Intervention Topic: Leisure    Discussion/Intervention:  Group content today was focused on leisure. The group defined what leisure is and some positive leisure activities they participate in. Individuals identified the difference between good and bad leisure. Participants expressed how they feel after participating in the leisure of their choice. The group discussed how they go about picking a leisure activity and if others are involved in their leisure activities. The patient stated how many leisure activities they have to choose from and reasons why it is important to have leisure time. Individuals participated in the intervention "Leisure Jeopardy" where they had a chance to identify new leisure activities as well as benefits of leisure. Clinical Observations/Feedback:  Patient came to group defined leisure as something that makes you happy. She stated that her leisure activity is walking her dog. Individual was social with peers and staff while participting in the intervention.    Anguel Delapena LRT/CTRS         Norma Ignasiak 03/16/2019 12:53 PM

## 2019-03-16 NOTE — BHH Counselor (Signed)
CSW spoke with the patient's ACT Team Lead, Fleet Contras [phone].  Per Fleet Contras the patient was last hospitalized in October 2020 for an overnight stay at [facility] in [place], then was hospitalized 6/4-01/2019 and 2/3-17/2020 at [facility].    She reports that she will fax the pt medication list to this CSW.  She reports that pt has a history of trying to "treatment split" when patient is in the hospital.  Penni Homans, MSW, LCSW 03/16/2019 1:05 PM

## 2019-03-16 NOTE — Plan of Care (Signed)
  Problem: Education: Goal: Emotional status will improve Outcome: Not Progressing Goal: Mental status will improve Outcome: Not Progressing Goal: Verbalization of understanding the information provided will improve Outcome: Not Progressing   

## 2019-03-16 NOTE — Progress Notes (Signed)
Admission Note:  24 yr female who presents IVC in no acute distress for the treatment of SI and Depression. Patient appears flat, sad and depressed, she was noted sullen , minimal interaction during admission. Patient was calm and cooperative with admission process, she currently denies SI/HI/AVH upon admission.    Patient explained she experienced SI  Because of worsening depression, isolation because of COVID, death of loved ones from COVID, and also she is dealing with health issues . Patient she goes to [facility] recovery services and she says she is on disability. Skin was assessed and found to be dry, and warm to touch, but around her left breast some redness/ rashes was noted, patient was searched for contraband non found, POC and unit policies explained and understanding verbalized. Consents obtained. Food and fluids offered, and it was accepted. 15 minutes safety checks maintained will continue to monitor.

## 2019-03-17 DIAGNOSIS — F332 Major depressive disorder, recurrent severe without psychotic features: Secondary | ICD-10-CM

## 2019-03-17 LAB — LITHIUM LEVEL: Lithium Lvl: 0.42 mmol/L — ABNORMAL LOW (ref 0.60–1.20)

## 2019-03-17 MED ORDER — BUSPIRONE HCL 5 MG PO TABS
15.0000 mg | ORAL_TABLET | Freq: Two times a day (BID) | ORAL | Status: DC
  Administered 2019-03-17 – 2019-03-20 (×6): 15 mg via ORAL
  Filled 2019-03-17 (×6): qty 3

## 2019-03-17 MED ORDER — MODAFINIL 100 MG PO TABS
100.0000 mg | ORAL_TABLET | Freq: Every day | ORAL | Status: DC
  Administered 2019-03-18 – 2019-03-20 (×3): 100 mg via ORAL
  Filled 2019-03-17 (×3): qty 1

## 2019-03-17 MED ORDER — LISINOPRIL 20 MG PO TABS
20.0000 mg | ORAL_TABLET | Freq: Every day | ORAL | Status: DC
  Administered 2019-03-19 – 2019-03-23 (×5): 20 mg via ORAL
  Filled 2019-03-17 (×7): qty 1

## 2019-03-17 MED ORDER — SERTRALINE HCL 25 MG PO TABS
150.0000 mg | ORAL_TABLET | Freq: Every day | ORAL | Status: DC
  Administered 2019-03-18 – 2019-03-20 (×3): 150 mg via ORAL
  Filled 2019-03-17 (×3): qty 2

## 2019-03-17 MED ORDER — PRAZOSIN HCL 2 MG PO CAPS
4.0000 mg | ORAL_CAPSULE | Freq: Every day | ORAL | Status: DC
  Administered 2019-03-17 – 2019-03-22 (×6): 4 mg via ORAL
  Filled 2019-03-17 (×7): qty 2

## 2019-03-17 NOTE — Plan of Care (Signed)
  Problem: Education: Goal: Emotional status will improve Outcome: Progressing  Patient appears less anxious.

## 2019-03-17 NOTE — Progress Notes (Signed)
Patient alert and oriented x 4, affect is flat and sad, she denies SI/HI/AVH no distress noted, she appears less anxious she was noted interacting appropriately with peers in  the day room. Patient ws offered emotional support and encouragement, she was complaint with medication regimen, she was placed on CPAP machine at night placed on monitor foe safety, 15 minutes safety checks maintained will continue to monitor.

## 2019-03-17 NOTE — BHH Group Notes (Signed)
LCSW Group Therapy Notes  Date and Time: 03/17/2019  Type of Therapy and Topic: Group Therapy: Healthy Vs. Unhealthy Coping Strategies  Participation Level: B[facility] PARTICIPATION LEVEL: Active  Description of Group:  In this group, patients will be encouraged to explore their healthy and unhealthy coping strategics. Coping strategies are actions that we take to deal with stress, problems, or uncomfortable emotions in our daily lives. Each patient will be challenged to read some scenarios and discuss the unhealthy and healthy coping strategies within those scenarios. Also, each patient will be challenged to describe current healthy and unhealthy strategies that they use in their own lives and discuss the outcomes and barriers to those strategies. This group will be process-oriented, with patients participating in exploration of their own experiences as well as giving and receiving support and challenge from other group members.  Therapeutic Goals: 1. Patient will identify personal healthy and unhealthy coping strategies. 2. Patient will identify healthy and unhealthy coping strategies, in others, through scenarios.  3. Patient will identify expected outcomes of healthy and unhealthy coping strategies. 4. Patient will identify barriers to using healthy coping strategies.   Summary of Patient Progress:  The patient stated she currently feeling "depressed and anxious, because I live alone and due to COVID-19 there are a lot of restrictions. Also, my grandfather passed away because of COVID-19". The patient stated she will begin to "hold myself accountable for my responsibilities" as a way to practice healthy coping strategies.   Therapeutic Modalities:  Cognitive Behavioral Therapy Solution Focused Therapy Motivational Interviewing   Teresita Madura, MSW, [employer] Clinical Social Worker

## 2019-03-17 NOTE — Progress Notes (Addendum)
Pt went to group today. She remained cooperative, calm with a depressed effect.Pt asked me if we could talk. She said that after one of the other patients had an outburst it gave her SI. She was concerned that she has not grieved well over her grandpa passing. She spoke of her break up with boyfriend. I listened and showed emotional support and encouraged her in some ways that would relate to her. She was very grateful. I saw her being more social after that and going to do laundry. Torrie Mayers RN

## 2019-03-17 NOTE — Plan of Care (Signed)
Pt rates depression, anxiety, SI, HI and AVH. Pt was educated on care plan and verbalizes understanding. Torrie Mayers RN Problem: Education: Goal: Knowledge of [facility] Education information/materials will improve Outcome: Progressing Goal: Emotional status will improve Outcome: Progressing Goal: Mental status will improve Outcome: Progressing Goal: Verbalization of understanding the information provided will improve Outcome: Progressing   Problem: Safety: Goal: Periods of time without injury will increase Outcome: Progressing   Problem: Education: Goal: Ability to make informed decisions regarding treatment will improve Outcome: Progressing   Problem: Medication: Goal: Compliance with prescribed medication regimen will improve Outcome: Progressing

## 2019-03-17 NOTE — Tx Team (Signed)
:Interdisciplinary Treatment and Diagnostic Plan Update  03/17/2019 Time of Session: 9:15am Sharissa Hechavarria MRN: [id number]  Principal Diagnosis: MDD (major depressive disorder), recurrent episode, severe (HCC)  Secondary Diagnoses: Principal Problem:   MDD (major depressive disorder), recurrent episode, severe (HCC) Active Problems:   GAD (generalized anxiety disorder)   Obstructive sleep apnea   GERD (gastroesophageal reflux disease)   H/O ASTHMA   Hypertension   Morbid obesity (HCC)   Posttraumatic stress disorder   Current Medications:  Current Facility-Administered Medications  Medication Dose Route Frequency Provider Last Rate Last Admin  . acetaminophen (TYLENOL) tablet 650 mg  650 mg Oral Q6H PRN Gillermo Murdoch, NP   650 mg at 03/16/19 0014  . albuterol (PROVENTIL) (2.5 MG/3ML) 0.083% nebulizer solution 2.5 mg  2.5 mg Inhalation Q6H PRN Clapacs, John T, MD      . alum & mag hydroxide-simeth (MAALOX/MYLANTA) 200-200-20 MG/5ML suspension 30 mL  30 mL Oral Q4H PRN Gillermo Murdoch, NP      . benztropine (COGENTIN) tablet 0.5 mg  0.5 mg Oral Daily Clapacs, John T, MD   0.5 mg at 03/17/19 0815  . busPIRone (BUSPAR) tablet 5 mg  5 mg Oral Daily Clapacs, Jackquline Denmark, MD   5 mg at 03/17/19 0816  . guanFACINE (INTUNIV) ER tablet 2 mg  2 mg Oral Daily Clapacs, John T, MD   2 mg at 03/17/19 0815  . haloperidol (HALDOL) tablet 5 mg  5 mg Oral TID Clapacs, John T, MD   5 mg at 03/17/19 0816  . hydrOXYzine (ATARAX/VISTARIL) tablet 50 mg  50 mg Oral Q6H PRN Jearld Lesch, NP   50 mg at 03/16/19 2141  . levothyroxine (SYNTHROID) tablet 50 mcg  50 mcg Oral Q0600 Clapacs, Jackquline Denmark, MD   50 mcg at 03/17/19 0704  . [START ON 03/18/2019] lisinopril (ZESTRIL) tablet 20 mg  20 mg Oral Daily Malvin Johns, MD      . lithium carbonate capsule 300 mg  300 mg Oral TID WC Clapacs, Jackquline Denmark, MD   300 mg at 03/17/19 0816  . loratadine (CLARITIN) tablet 10 mg  10 mg Oral Daily Clapacs, Jackquline Denmark, MD   10 mg at  03/17/19 0815  . magnesium hydroxide (MILK OF MAGNESIA) suspension 30 mL  30 mL Oral Daily PRN Gillermo Murdoch, NP      . montelukast (SINGULAIR) tablet 10 mg  10 mg Oral QHS Clapacs, Jackquline Denmark, MD   10 mg at 03/16/19 2116  . sertraline (ZOLOFT) tablet 100 mg  100 mg Oral Daily Clapacs, Jackquline Denmark, MD   100 mg at 03/17/19 3403   PTA Medications: Medications Prior to Admission  Medication Sig Dispense Refill Last Dose  . albuterol (VENTOLIN HFA) 108 (90 Base) MCG/ACT inhaler Inhale 2 puffs into the lungs every 4 (four) hours as needed for wheezing or shortness of breath.     . Albuterol Sulfate 2.5 MG/0.5ML NEBU Inhale into the lungs.     . benztropine (COGENTIN) 0.5 MG tablet Take 1 tablet by mouth daily.     . budesonide-formoterol (SYMBICORT) 160-4.5 MCG/ACT inhaler Inhale 1 puff into the lungs 2 (two) times daily.     . busPIRone (BUSPAR) 5 MG tablet Take by mouth.     . clobetasol (TEMOVATE) 0.05 % external solution Apply topically.     . Desogestrel-Ethinyl Estradiol (RECLIPSEN PO) Take 1 tablet by mouth.     . EPINEPHrine 0.3 mg/0.3 mL IJ SOAJ injection Use as directed for life threatening allergic  reaction 2 Device 3   . ferrous sulfate 325 (65 FE) MG tablet Take 1 tablet (325 mg total) by mouth daily with breakfast. (May buy from the over the counter): For anemia 1 tablet 0   . Fluocinolone Acetonide Scalp (DERMA-SMOOTHE/FS SCALP) 0.01 % OIL Apply topically.     Marland Kitchen guanFACINE HCl (INTUNIV PO) Take 2 mg by mouth daily.      . haloperidol (HALDOL) 5 MG tablet Take 1 tablet (5 mg total) by mouth 3 (three) times daily. For mood control 90 tablet 0   . hydrochlorothiazide (HYDRODIURIL) 25 MG tablet Take 1 tablet (25 mg total) by mouth daily. 90 tablet 1   . hydrOXYzine (ATARAX/VISTARIL) 50 MG tablet Take 1 tablet (50 mg total) by mouth every 6 (six) hours as needed for anxiety. 60 tablet 0   . levothyroxine (SYNTHROID) 50 MCG tablet Take 50 mcg by mouth daily before breakfast.     . lisinopril  (ZESTRIL) 40 MG tablet Take 1 tablet (40 mg total) by mouth daily. For high blood pressure 90 tablet 1   . lithium carbonate 300 MG capsule Take 300 mg by mouth 3 (three) times daily with meals.     Marland Kitchen loratadine (CLARITIN) 10 MG tablet Take 1 tablet (10 mg total) by mouth daily. (May buy from over the counter): For allergies 30 tablet 0   . montelukast (SINGULAIR) 10 MG tablet Take 1 tablet (10 mg total) by mouth at bedtime. For asthma     . pantoprazole (PROTONIX) 40 MG tablet Take 40 mg by mouth daily.     . sertraline (ZOLOFT) 100 MG tablet Take 100 mg by mouth daily.     Marland Kitchen spironolactone (ALDACTONE) 25 MG tablet TAKE ONE TABLET BY MOUTH ONCE DAILY 30 tablet 3     Patient Stressors: Health problems Marital or family conflict Medication change or noncompliance  Patient Strengths: Ability for insight General fund of knowledge Motivation for treatment/growth Supportive family/friends  Treatment Modalities: Medication Management, Group therapy, Case management,  1 to 1 session with clinician, Psychoeducation, Recreational therapy.   Physician Treatment Plan for Primary Diagnosis: MDD (major depressive disorder), recurrent episode, severe ([place]) Long Term Goal(s): Improvement in symptoms so as ready for discharge Improvement in symptoms so as ready for discharge   Short Term Goals: Ability to verbalize feelings will improve Ability to disclose and discuss suicidal ideas Ability to demonstrate self-control will improve Ability to maintain clinical measurements within normal limits will improve Compliance with prescribed medications will improve  Medication Management: Evaluate patient's response, side effects, and tolerance of medication regimen.  Therapeutic Interventions: 1 to 1 sessions, Unit Group sessions and Medication administration.  Evaluation of Outcomes: Progressing  Physician Treatment Plan for Secondary Diagnosis: Principal Problem:   MDD (major depressive disorder),  recurrent episode, severe ([place]) Active Problems:   GAD (generalized anxiety disorder)   Obstructive sleep apnea   GERD (gastroesophageal reflux disease)   H/O ASTHMA   Hypertension   Morbid obesity ([place])   Posttraumatic stress disorder  Long Term Goal(s): Improvement in symptoms so as ready for discharge Improvement in symptoms so as ready for discharge   Short Term Goals: Ability to verbalize feelings will improve Ability to disclose and discuss suicidal ideas Ability to demonstrate self-control will improve Ability to maintain clinical measurements within normal limits will improve Compliance with prescribed medications will improve     Medication Management: Evaluate patient's response, side effects, and tolerance of medication regimen.  Therapeutic Interventions: 1 to 1 sessions,  Unit Group sessions and Medication administration.  Evaluation of Outcomes: Progressing   RN Treatment Plan for Primary Diagnosis: MDD (major depressive disorder), recurrent episode, severe (HCC) Long Term Goal(s): Knowledge of disease and therapeutic regimen to maintain health will improve  Short Term Goals: Ability to remain free from injury will improve, Ability to verbalize frustration and anger appropriately will improve, Ability to demonstrate self-control, Ability to participate in decision making will improve, Ability to verbalize feelings will improve, Ability to disclose and discuss suicidal ideas, Ability to identify and develop effective coping behaviors will improve and Compliance with prescribed medications will improve  Medication Management: RN will administer medications as ordered by provider, will assess and evaluate patient's response and provide education to patient for prescribed medication. RN will report any adverse and/or side effects to prescribing provider.  Therapeutic Interventions: 1 on 1 counseling sessions, Psychoeducation, Medication administration, Evaluate responses to  treatment, Monitor vital signs and CBGs as ordered, Perform/monitor CIWA, COWS, AIMS and Fall Risk screenings as ordered, Perform wound care treatments as ordered.  Evaluation of Outcomes: Progressing   LCSW Treatment Plan for Primary Diagnosis: MDD (major depressive disorder), recurrent episode, severe (HCC) Long Term Goal(s): Safe transition to appropriate next level of care at discharge, Engage patient in therapeutic group addressing interpersonal concerns.  Short Term Goals: Engage patient in aftercare planning with referrals and resources, Increase social support, Increase ability to appropriately verbalize feelings, Increase emotional regulation, Facilitate acceptance of mental health diagnosis and concerns, Identify triggers associated with mental health/substance abuse issues and Increase skills for wellness and recovery  Therapeutic Interventions: Assess for all discharge needs, 1 to 1 time with Social worker, Explore available resources and support systems, Assess for adequacy in community support network, Educate family and significant other(s) on suicide prevention, Complete Psychosocial Assessment, Interpersonal group therapy.  Evaluation of Outcomes: Progressing   Progress in Treatment: Attending groups: Yes. Participating in groups: Yes. Taking medication as prescribed: Yes. Toleration medication: Yes. Family/Significant other contact made: No, will contact:  Pt will provide consent. Patient understands diagnosis: Yes. Discussing patient identified problems/goals with staff: Yes. Medical problems stabilized or resolved: Yes. Denies suicidal/homicidal ideation: Yes. Issues/concerns per patient self-inventory: No. Other: None  New problem(s) identified: No, Describe:  None  New Short Term/Long Term Goal(s):  Patient Goals:  "Not to feel depressed"  Discharge Plan or Barriers: Pt will return home and participate in outpatient services.  Reason for Continuation of  Hospitalization: Depression Medication stabilization  Estimated Length of Stay: 3-5 days  Attendees: Patient: Misty [name] 03/17/2019 9:44 AM  Physician: Dr. Jeannine Kitten 03/17/2019 9:44 AM  Nursing: Hulan Amato, RN 03/17/2019 9:44 AM  RN Care Manager: 03/17/2019 9:44 AM  Social Worker: Teresita Madura, [place] 03/17/2019 9:44 AM  Recreational Therapist:  03/17/2019 9:44 AM  Other: Roselyn Bering, LCSW 03/17/2019 9:44 AM  Other:  03/17/2019 9:44 AM  Other: 03/17/2019 9:44 AM    Scribe for Treatment Team: Jimmey Ralph, LCSWA 03/17/2019 9:44 AM
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
## 2019-03-18 DIAGNOSIS — F332 Major depressive disorder, recurrent severe without psychotic features: Secondary | ICD-10-CM

## 2019-03-18 MED ORDER — LITHIUM CARBONATE 300 MG PO CAPS
300.0000 mg | ORAL_CAPSULE | Freq: Two times a day (BID) | ORAL | Status: DC
  Administered 2019-03-18 – 2019-03-20 (×4): 300 mg via ORAL
  Filled 2019-03-18 (×4): qty 1

## 2019-03-18 NOTE — Progress Notes (Signed)
Pt came up to me and told me that she was having passive SI thoughts. She contracted verbally for safety. I gave her support and redirected her to a new word find book and puzzle. Torrie Mayers RN

## 2019-03-18 NOTE — Plan of Care (Signed)
Pt rates depression, anxiety and hopelessness all 10/10. Pt was educated on care plan and verbalizes understanding. Torrie Mayers RN Problem: Education: Goal: Charity fundraiser Education information/materials will improve Outcome: Progressing Goal: Emotional status will improve Outcome: Progressing Goal: Mental status will improve Outcome: Progressing Goal: Verbalization of understanding the information provided will improve Outcome: Progressing   Problem: Safety: Goal: Periods of time without injury will increase Outcome: Progressing   Problem: Education: Goal: Ability to make informed decisions regarding treatment will improve Outcome: Progressing

## 2019-03-18 NOTE — Plan of Care (Signed)
  Problem: Education: Goal: Knowledge of [facility] Education information/materials will improve Outcome: Progressing Goal: Emotional status will improve Outcome: Progressing Goal: Mental status will improve Outcome: Progressing Goal: Verbalization of understanding the information provided will improve Outcome: Progressing  D: Patient has been calm and cooperative. Passive SI but contracting for safety A: Continue to monitor for safety R: Safety maintained.

## 2019-03-18 NOTE — [facility] Group Notes (Signed)
LCSW Group Therapy Notes   Date and Time: 03/18/2019 1:00pm  Type of Therapy and Topic: Group Therapy: Worry and Anxiety  Participation Level: [facility] PARTICIPATION LEVEL: Active  Description of Group: In this group, patients will be encouraged to explore their worry around what could happen vs what will happen. Each patient will be challenged to think of personal worries and how they will work their way through that worry around what will happen and what could happen. This group will be process-oriented, with patients participating in exploration of their own experiences as well as giving and receiving support and challenge from other group members.  Therapeutic Goals: 1. Patient will identify personal worries that cause anxiety. 2. Patient will identify clues to identify their worry. 3. Patient will identify ways to handle their worry. 4. Patient will discuss ways that their worry has deceased or why it has not decreased.   Summary of Patient Progress:  Pt described her current feelings as "I still have a lot of depression". Pt participated in the group and identified a worry. Pt was able to complete the handout and decrease her worry. The pt identified ways to decrease worry and plans to use them in the future.   Therapeutic Modalities:  Cognitive Behavioral Therapy Solution Focused Therapy Motivational Interviewing    Teresita Madura, MSW, [employer] Clinical Social Worker
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
## 2019-03-18 NOTE — Progress Notes (Signed)
D: Patient has been calm and cooperative. Passive SI but contracting for safety A: Continue to monitor for safety R: Safety maintained.

## 2019-03-18 NOTE — Progress Notes (Signed)
Patient's mother called. Her name is Avielle also. She wanted to share with me more in depth about patint's struggle with depression. She wants the physician to call her at [phone]. Torrie Mayers RN

## 2019-03-19 MED ORDER — CLOBETASOL PROPIONATE 0.05 % EX OINT
TOPICAL_OINTMENT | Freq: Two times a day (BID) | CUTANEOUS | Status: DC
  Administered 2019-03-19 – 2019-03-21 (×4): 1 via TOPICAL
  Filled 2019-03-19: qty 15

## 2019-03-19 NOTE — Progress Notes (Signed)
   03/19/19 0930  Clinical Encounter Type  Visited With Patient  Visit Type Initial  Referral From Nurse  Consult/Referral To Chaplain  Chaplain visited with patient after speaking with nurse. Nurse informed Chaplain that patient is depressed. Patient mentioned being depressed due to the passing of her grandfather, who died from COVID. Patient said she is also depressed do to her weight and other things that are going on in her left. Chaplain and patient discussed two thing that she could do about her weight. Patient agreed that she could take control by exercising and managing her diet.

## 2019-03-19 NOTE — [facility] Suicide Risk Assessment (Signed)
[facility] INPATIENT:  Family/Significant Other Suicide Prevention Education  Suicide Prevention Education:  Education Completed; Kenia Teagarden, mother [phone] has been identified by the patient as the family member/significant other with whom the patient will be residing, and identified as the person(s) who will aid the patient in the event of a mental health crisis (suicidal ideations/suicide attempt).  With written consent from the patient, the family member/significant other has been provided the following suicide prevention education, prior to the and/or following the discharge of the patient.  The suicide prevention education provided includes the following:  Suicide risk factors  Suicide prevention and interventions  National Suicide Hotline telephone number  [facility] assessment telephone number  [facility] Emergency Assistance 911  [facility] and/or Residential Mobile Crisis Unit telephone number  Request made of family/significant other to:  Remove weapons (e.g., guns, rifles, knives), all items previously/currently identified as safety concern.    Remove drugs/medications (over-the-counter, prescriptions, illicit drugs), all items previously/currently identified as a safety concern.  The family member/significant other verbalizes understanding of the suicide prevention education information provided.  The family member/significant other agrees to remove the items of safety concern listed above. Ms. Allyne Gee reports the pt has history of depression and states depressive symptoms worsened over past few weeks. She reports the pt had been having problems with sleeping, social isolation, agitation and crying spells. She states the pt has had an ACT Team for a couple of years. She denies the pt having access to guns or weapons in her home. Ms. Allyne Gee reports the guns in her home are locked and secured in a cabinet. Ms. Allyne Gee states at discharge the pt can  return to her home for a period of time if she would like.  Babak Lucus T Simrah Chatham 03/19/2019, 1:52 PM

## 2019-03-19 NOTE — Plan of Care (Signed)
Patient rated her depression and anxiety 10/10.Patient came to staff multiple times with severe anxiety and states "I feel like it is better to die."Patient contracts foe safety in the unit.Attended groups.Visible in the milieu.Appetite and energy level good.Listened to patient and helped to use coping skills.Support and encouragement given.

## 2019-03-19 NOTE — Progress Notes (Signed)
[facility] MD Progress Note  03/19/2019 1:52 PM Misty [name]  MRN:  [id number]   Subjective: Follow-up for this 24 year old female diagnosed with MDD severe recurrent.  Patient reports today that she continues to feel very depressed.  She states that she continues to have some vague suicidal ideations with no plan or intent.  She states that she does have some racing thoughts.  She reports that her depression is at a 0 out of 10 with 10 being the worst and only states that she has severe anxiety.  Patient reports that the biggest thing that she wants to do so that she stops feeling physically sick and she thinks that is due to her depression.  Patient feels that she has to work too hard to not be depressed.  Patient reports that she has not really follow through with her therapy counseling due to COVID and that her mom would like to speak to me about her medications.  Patient does report that positive things in her life are having her dogs around. Patient's mother was contacted as requested by the patient.  Patient's mother reports that she feels the patient needs to be alone more medications.  She reports that she feels her daughter is working too hard to not be depressed and feels that she needs higher doses or an additional medication to improve her depression.  She also confirms that the ACT team has not been able to have therapy sessions as often due to COVID-19.  Principal Problem: MDD (major depressive disorder), recurrent episode, severe (HCC) Diagnosis: Principal Problem:   MDD (major depressive disorder), recurrent episode, severe (HCC) Active Problems:   GAD (generalized anxiety disorder)   Obstructive sleep apnea   GERD (gastroesophageal reflux disease)   H/O ASTHMA   Hypertension   Morbid obesity (HCC)   Posttraumatic stress disorder  Total Time spent with patient: 30 minutes  Past Psychiatric History: Patient has had multiple psychiatric hospitalizations and chronic psychiatric treatment  since a young age.  Multiple hospitalizations as a child.  It appears her last hospitalization was about a year and a half ago at [facility].  She has been variously diagnosed as bipolar PTSD and depression.  Looking through the old notes I am not sure I can find any evidence of a manic syndrome or psychotic symptoms particularly not as an adult.  Seems to have more chronic anxiety and depression.  Patient has been on multiple medications over the years.  I reviewed with her the medicine she was on when she last left behavioral hospital which are pretty different than what she is taking right now.  Patient was not a good historian about this.  Had no memory of anything specific about prior medicines.  Told me she had no idea which medicines had ever been helpful for her.  Could not tell me whether any medicines had really helped with her depression.  She did tell me that she has nightmares frequently and that Minipress actually made them worse.  She has a history of chronic recurrent suicidal thinking but no history of actual suicide attempts  Past Medical History:  Past Medical History:  Diagnosis Date  . Allergic rhinitis   . Anxiety   . Depression   . GERD (gastroesophageal reflux disease)   . HTN (hypertension) 07/13/12  . IBS (irritable bowel syndrome)   . Obesity   . OSA on CPAP   . Psoriasis     Past Surgical History:  Procedure Laterality Date  .  ADENOIDECTOMY    . SINOSCOPY    . TONSILLECTOMY AND ADENOIDECTOMY  Age 27   Deviated septum corrected at same time   Family History:  Family History  Problem Relation Age of Onset  . Depression Mother   . Allergic rhinitis Mother   . Depression Maternal Grandmother   . Diabetes Maternal Grandmother   . Hypertension Maternal Grandmother   . Bipolar disorder Father   . Drug abuse Father   . Hypertension Father    Family Psychiatric  History: History of anxiety and depression Social History:  Social History    Substance and Sexual Activity  Alcohol Use Yes   Comment: socially     Social History   Substance and Sexual Activity  Drug Use No    Social History   Socioeconomic History  . Marital status: Single    Spouse name: Not on file  . Number of children: Not on file  . Years of education: Not on file  . Highest education level: Not on file  Occupational History  . Occupation: STUDENT    Employer: [employer]    Comment: 11th grade [employer]  Tobacco Use  . Smoking status: Never Smoker  . Smokeless tobacco: Never Used  Substance and Sexual Activity  . Alcohol use: Yes    Comment: socially  . Drug use: No  . Sexual activity: Not Currently  Other Topics Concern  . Not on file  Social History Narrative  . Not on file   Social Determinants of Health   Financial Resource Strain:   . Difficulty of Paying Living Expenses: Not on file  Food Insecurity:   . Worried About Programme researcher, broadcasting/film/video in the Last Year: Not on file  . Ran Out of Food in the Last Year: Not on file  Transportation Needs:   . Lack of Transportation (Medical): Not on file  . Lack of Transportation (Non-Medical): Not on file  Physical Activity:   . Days of Exercise per Week: Not on file  . Minutes of Exercise per Session: Not on file  Stress:   . Feeling of Stress : Not on file  Social Connections:   . Frequency of Communication with Friends and Family: Not on file  . Frequency of Social Gatherings with Friends and Family: Not on file  . Attends Religious Services: Not on file  . Active Member of Clubs or Organizations: Not on file  . Attends Banker Meetings: Not on file  . Marital Status: Not on file   Additional Social History:                         Sleep: Good  Appetite:  Good  Current Medications: Current Facility-Administered Medications  Medication Dose Route Frequency Provider Last Rate Last Admin  . acetaminophen (TYLENOL) tablet 650 mg  650 mg Oral Q6H PRN  Gillermo Murdoch, NP   650 mg at 03/19/19 0630  . albuterol (PROVENTIL) (2.5 MG/3ML) 0.083% nebulizer solution 2.5 mg  2.5 mg Inhalation Q6H PRN Clapacs, Jackquline Denmark, MD      . alum & mag hydroxide-simeth (MAALOX/MYLANTA) 200-200-20 MG/5ML suspension 30 mL  30 mL Oral Q4H PRN Gillermo Murdoch, NP      . busPIRone (BUSPAR) tablet 15 mg  15 mg Oral BID Malvin Johns, MD   15 mg at 03/19/19 0802  . guanFACINE (INTUNIV) ER tablet 2 mg  2 mg Oral Daily Clapacs, Jackquline Denmark, MD   2  mg at 03/19/19 0802  . hydrOXYzine (ATARAX/VISTARIL) tablet 50 mg  50 mg Oral Q6H PRN Deloria Lair, NP   50 mg at 03/19/19 0910  . levothyroxine (SYNTHROID) tablet 50 mcg  50 mcg Oral Q0600 Clapacs, Madie Reno, MD   50 mcg at 03/19/19 0630  . lisinopril (ZESTRIL) tablet 20 mg  20 mg Oral Daily Johnn Hai, MD   20 mg at 03/19/19 0802  . lithium carbonate capsule 300 mg  300 mg Oral BID WC Johnn Hai, MD   300 mg at 03/19/19 0801  . loratadine (CLARITIN) tablet 10 mg  10 mg Oral Daily Clapacs, Madie Reno, MD   10 mg at 03/19/19 0801  . magnesium hydroxide (MILK OF MAGNESIA) suspension 30 mL  30 mL Oral Daily PRN Caroline Sauger, NP      . modafinil (PROVIGIL) tablet 100 mg  100 mg Oral Daily Johnn Hai, MD   100 mg at 03/19/19 0802  . montelukast (SINGULAIR) tablet 10 mg  10 mg Oral QHS Clapacs, Madie Reno, MD   10 mg at 03/18/19 2123  . prazosin (MINIPRESS) capsule 4 mg  4 mg Oral QHS Johnn Hai, MD   4 mg at 03/18/19 2123  . sertraline (ZOLOFT) tablet 150 mg  150 mg Oral Daily Johnn Hai, MD   150 mg at 03/19/19 0801    Lab Results: No results found for this or any previous visit (from the past 87 hour(s)).  Blood Alcohol level:  Lab Results  Component Value Date   [facility]  06/24/2008    <5        LOWEST DETECTABLE LIMIT FOR SERUM ALCOHOL IS 5 mg/dL FOR MEDICAL PURPOSES ONLY    Metabolic Disorder Labs: Lab Results  Component Value Date   HGBA1C 5.1 04/17/2017   MPG 99.67 04/17/2017   MPG 100 02/25/2008   No  results found for: PROLACTIN Lab Results  Component Value Date   CHOL 155 04/17/2017   TRIG 135 04/17/2017   HDL 46 04/17/2017   CHOLHDL 3.4 04/17/2017   VLDL 27 04/17/2017   LDLCALC 82 04/17/2017   LDLCALC 93 03/01/2012    Physical Findings: AIMS:  , ,  ,  ,    CIWA:    COWS:     Musculoskeletal: Strength & Muscle Tone: within normal limits Gait & Station: normal Patient leans: N/A  Psychiatric Specialty Exam: Physical Exam  Nursing note and vitals reviewed. Constitutional: She is oriented to person, place, and time. She appears well-developed and well-nourished.  Cardiovascular: Normal rate.  Respiratory: Effort normal.  Musculoskeletal:        General: Normal range of motion.  Neurological: She is alert and oriented to person, place, and time.  Skin: Skin is warm.    Review of Systems  Constitutional: Negative.   HENT: Negative.   Eyes: Negative.   Respiratory: Negative.   Cardiovascular: Negative.   Gastrointestinal: Negative.   Genitourinary: Negative.   Musculoskeletal: Negative.   Skin: Negative.   Neurological: Negative.   Psychiatric/Behavioral: Negative.     Blood pressure 140/70, pulse 74, temperature (!) 97.5 F (36.4 C), temperature source Oral, resp. rate 18, height 5\' 4"  (1.626 m), weight (!) 188.2 kg, SpO2 99 %.Body mass index is 71.23 kg/m.  General Appearance: Casual  Eye Contact:  Good  Speech:  Clear and Coherent and Normal Rate  Volume:  Normal  Mood:  Dysphoric  Affect:  Congruent  Thought Process:  Coherent and Descriptions of Associations: Intact  Orientation:  Full (Time, Place, and Person)  Thought Content:  WDL  Suicidal Thoughts:  No  Homicidal Thoughts:  No  Memory:  Immediate;   Fair Recent;   Fair Remote;   Fair  Judgement:  Fair  Insight:  Fair  Psychomotor Activity:  Normal  Concentration:  Concentration: Good  Recall:  Good  Fund of Knowledge:  Fair  Language:  Fair  Akathisia:  No  Handed:  Right  AIMS (if  indicated):     Assets:  Communication Skills Desire for Improvement Financial Resources/Insurance Housing Resilience Social Support Transportation  ADL's:  Intact  Cognition:  WNL  Sleep:  Number of Hours: 7   Assessment: Patient presents in the day room and is interacting with peers and staff appropriately.  Patient has been seen laughing and talking to several people.  Patient does not appear to be depressed until she is in the office with me during the evaluation.  Patient was also witnessed going outside and was shooting basketball with other peers.  Patient has continued to endorse having suicidal ideations and continues reporting severe depression.  Feel that there is part of this that is some personality disorder.  Also requested for the chaplain to speak with the patient about her grandfather passing away which is reported to be the cause of this severe depression episode.  Patient was in agreement with speaking with the chaplain and thought that it would be a good idea.  Patient was also informed of contacting hospice when she is discharged to find out about going to grievance counseling.  Treatment Plan Summary: Daily contact with patient to assess and evaluate symptoms and progress in treatment and Medication management Continue BuSpar 50 mg p.o. twice daily for anxiety Continue Intuniv 2 mg p.o. daily Continue Vistaril 50 mg p.o. every 6 hours as needed for anxiety Continue Synthroid 50 mcg p.o. daily for hypothyroidism Continue lithium carbonate 300 mg p.o. twice daily with meals for mood stability Continue prazosin 4 mg p.o. nightly for nightmares Continue Zoloft 150 mg p.o. daily for depression and anxiety Encourage group therapy participation Continue every 15 minute safety checks  Maryfrances Bunnell, FNP 03/19/2019, 1:52 PM

## 2019-03-19 NOTE — Plan of Care (Signed)
  Problem: Education: Goal: Knowledge of [place] [facility] Education information/materials will improve Outcome: Progressing Goal: Emotional status will improve Outcome: Progressing Goal: Mental status will improve Outcome: Progressing Goal: Verbalization of understanding the information provided will improve Outcome: Progressing  D: Patient was having suicidal thoughts at beginning of shift but then stated she could not contract for safety later because she was having thoughts of scratching herself and did not trust herself to keep safe.  A: Placed on 1:1 for safety R: safety maintained.

## 2019-03-19 NOTE — Progress Notes (Signed)
Patient stated to this writer that she feels as if her medication isn't working and that she doesn't feel like the providers are understanding her. Patient also spoke with this writer about coping skills and what she can do differently when other patients on the unit make her feel uncomfortable. Patient stated that at times like this, she usually cuts herself or uses her fingernails to scratch. Patient contracted for safety and stated that she would come to staff again if anything changes.

## 2019-03-19 NOTE — Progress Notes (Signed)
   03/19/19 1400  Clinical Encounter Type  Visited With Patient  Visit Type Follow-up;Spiritual support;Social support;Behavioral Health  Referral From Nurse  Consult/Referral To Chaplain  While rounding Chaplain was informed that patient was depressed about the death of her grandfather. Nurses thought it would be good for Chaplain to talk with patient regarding her grandfather's death. Chaplain went to visit with patient and she was in group, therefore Chaplain came back about an hour later. Chaplain and patient did not talk long, because it was near time for patient to go outside. Patient said the nurse mentioned grief therapy through hospice, but said she does not have transportation. Chaplain will look into what is available for patient in [place]. Chaplain offered pastoral presence and empathy.

## 2019-03-19 NOTE — BHH Group Notes (Signed)
LCSW Group Therapy Note   03/19/2019 1:00 PM  Type of Therapy and Topic:  Group Therapy:  Overcoming Obstacles   Participation Level:  Minimal   Description of Group:    In this group patients will be encouraged to explore what they see as obstacles to their own wellness and recovery. They will be guided to discuss their thoughts, feelings, and behaviors related to these obstacles. The group will process together ways to cope with barriers, with attention given to specific choices patients can make. Each patient will be challenged to identify changes they are motivated to make in order to overcome their obstacles. This group will be process-oriented, with patients participating in exploration of their own experiences as well as giving and receiving support and challenge from other group members.   Therapeutic Goals: 1. Patient will identify personal and current obstacles as they relate to admission. 2. Patient will identify barriers that currently interfere with their wellness or overcoming obstacles.  3. Patient will identify feelings, thought process and behaviors related to these barriers. 4. Patient will identify two changes they are willing to make to overcome these obstacles:      Summary of Patient Progress Pt was present for group. Patient had minimal participation in group, though she appeared to be attentive.  Patient shared how her obstacle has been procrastination.    Therapeutic Modalities:   Cognitive Behavioral Therapy Solution Focused Therapy Motivational Interviewing Relapse Prevention Therapy  Penni Homans, MSW, LCSW 03/19/2019 12:47 PM

## 2019-03-19 NOTE — Progress Notes (Signed)
03/18/19 2200-0100  1:1 observation D: Patient has been having passive SI and has been contracting for safety, but approaches this Thereasa Parkin saying she is now unable to contract for safety. Says she is so depressed she cannot stop thinking about cutting herself. Has been quietly coloring in dayroom with female peer. No triggers for SI were mentioned. States she is chronically passively suicidal but the feelings have worsened. A: Placed on 1:1 for safety R: Safety maintained. 03/19/19 0100-0600 D: Resting in room with eyes closed. Staff within arms reach A: continue 1:1 for safety R: Safety maintained. 03/19/19 0630 D: Patient is now saying that she still his having suicidal thoughts but feels that she is able to contract for safety and says it helps to know that people care about her A: Continue to monitor for safety R: safety maintained.

## 2019-03-20 MED ORDER — SERTRALINE HCL 100 MG PO TABS
200.0000 mg | ORAL_TABLET | Freq: Every day | ORAL | Status: DC
  Administered 2019-03-21 – 2019-03-23 (×3): 200 mg via ORAL
  Filled 2019-03-20 (×3): qty 2

## 2019-03-20 MED ORDER — LITHIUM CARBONATE 300 MG PO CAPS
600.0000 mg | ORAL_CAPSULE | Freq: Every day | ORAL | Status: DC
  Administered 2019-03-20 – 2019-03-22 (×3): 600 mg via ORAL
  Filled 2019-03-20 (×3): qty 2

## 2019-03-20 NOTE — Progress Notes (Signed)
   03/20/19 1600  Clinical Encounter Type  Visited With Patient;Other (Comment)  Visit Type Follow-up;Spiritual support;Social support;Behavioral Health  Referral From Chaplain  Consult/Referral To Chaplain  Patient participated in group on [facility]. Patient remained positive throughout the entire time.  Chaplain was able to obtain a Hospice program that might be able to help patient with the grieving process over her grandfather. Chaplain will rely the information to a Child psychotherapist.

## 2019-03-20 NOTE — [facility] Group Notes (Signed)
Overcoming Obstacles  03/20/2019 1PM  Type of Therapy and Topic:  Group Therapy:  Overcoming Obstacles  Participation Level:  Minimal    Description of Group:    In this group patients will be encouraged to explore what they see as obstacles to their own wellness and recovery. They will be guided to discuss their thoughts, feelings, and behaviors related to these obstacles. The group will process together ways to cope with barriers, with attention given to specific choices patients can make. Each patient will be challenged to identify changes they are motivated to make in order to overcome their obstacles. This group will be process-oriented, with patients participating in exploration of their own experiences as well as giving and receiving support and challenge from other group members.   Therapeutic Goals: 1. Patient will identify personal and current obstacles as they relate to admission. 2. Patient will identify barriers that currently interfere with their wellness or overcoming obstacles.  3. Patient will identify feelings, thought process and behaviors related to these barriers. 4. Patient will identify two changes they are willing to make to overcome these obstacles:      Summary of Patient Progress Pt did not interact with group members but did complete group assignment. No input provided, respected boundaries during session.    Therapeutic Modalities:   Cognitive Behavioral Therapy Solution Focused Therapy Motivational Interviewing Relapse Prevention Therapy    Lowella Dandy, MSW, LCSW 03/20/2019 2:15 PM

## 2019-03-20 NOTE — Progress Notes (Signed)
D: Major Depression   A: Patient stated slept good last night .Stated appetite is good and energy level   normal. Stated concentration poor. Stated on Depression scale 10 , hopeless 10 and anxiety 10 .( low 0-10 high) Denies suicidal  homicidal ideations  .  No auditory hallucinations  No pain concerns . Appropriate ADL'S. Interacting with peers and staff. Patient voice understanding of [place] [employer] , able to verbalize understanding . Emotional and mental status  improved .  Working on [employer] , decision making and anxiety. Attending unit programing . Compliant  with medication given and knowledgeable of  her medication . Interacting with peers on   unit  Voice of no placement concerns.   goal today  " Tell the doctor how I feel "  Patient voice of exploring ECT Treatment. Encourage to talk to MD concerning  This  Treatment. Encourage patient participation with unit programming . Instruction  Given on  Medication , verbalize understanding.  Instructed patient on Deep breathing as a relaxation technique   R: Voice no other concerns. Staff continue to monitor

## 2019-03-20 NOTE — Progress Notes (Signed)
[facility] MD Progress Note  03/20/2019 4:18 PM Misty [name]  MRN:  [id number] Subjective: Follow-up for this 24 year old woman with chronic depression and presumed PTSD.  Patient says she is still feeling "very depressed and anxious".  I tried to get her to talk a bit about her actual symptoms but she gets mixed up and befuddled trying to think about specifics.  She does say that she still has thoughts of hurting herself.  Cannot really articulate why she would want to do that except that it is because she has "so many problems".  Has a hard time articulating exactly what those problems are.  Patient comes across as someone who has been impaired for so long that it is very difficult for them to even imagine how they would get better.  She was able to tell me that she felt too tired much of the time during the day.  I suggested that that could possibly be related to excessive medicine.  Patient has not been acting out not been doing anything aggressive or violent or trying to harm herself here in the hospital and has been cooperative with groups. Principal Problem: MDD (major depressive disorder), recurrent episode, severe (HCC) Diagnosis: Principal Problem:   MDD (major depressive disorder), recurrent episode, severe (HCC) Active Problems:   GAD (generalized anxiety disorder)   Obstructive sleep apnea   GERD (gastroesophageal reflux disease)   H/O ASTHMA   Hypertension   Morbid obesity (HCC)   Posttraumatic stress disorder  Total Time spent with patient: 30 minutes  Past Psychiatric History: Long history of chronic depression and anxiety probable PTSD  Past Medical History:  Past Medical History:  Diagnosis Date  . Allergic rhinitis   . Anxiety   . Depression   . GERD (gastroesophageal reflux disease)   . HTN (hypertension) 07/13/12  . IBS (irritable bowel syndrome)   . Obesity   . OSA on CPAP   . Psoriasis     Past Surgical History:  Procedure Laterality Date  . ADENOIDECTOMY    .  SINOSCOPY    . TONSILLECTOMY AND ADENOIDECTOMY  Age 15   Deviated septum corrected at same time   Family History:  Family History  Problem Relation Age of Onset  . Depression Mother   . Allergic rhinitis Mother   . Depression Maternal Grandmother   . Diabetes Maternal Grandmother   . Hypertension Maternal Grandmother   . Bipolar disorder Father   . Drug abuse Father   . Hypertension Father    Family Psychiatric  History: See previous Social History:  Social History   Substance and Sexual Activity  Alcohol Use Yes   Comment: socially     Social History   Substance and Sexual Activity  Drug Use No    Social History   Socioeconomic History  . Marital status: Single    Spouse name: Not on file  . Number of children: Not on file  . Years of education: Not on file  . Highest education level: Not on file  Occupational History  . Occupation: STUDENT    Employer: [employer]    Comment: 11th grade [employer]  Tobacco Use  . Smoking status: Never Smoker  . Smokeless tobacco: Never Used  Substance and Sexual Activity  . Alcohol use: Yes    Comment: socially  . Drug use: No  . Sexual activity: Not Currently  Other Topics Concern  . Not on file  Social History Narrative  . Not on file   Social  Determinants of Health   Financial Resource Strain:   . Difficulty of Paying Living Expenses: Not on file  Food Insecurity:   . Worried About Charity fundraiser in the Last Year: Not on file  . Ran Out of Food in the Last Year: Not on file  Transportation Needs:   . Lack of Transportation (Medical): Not on file  . Lack of Transportation (Non-Medical): Not on file  Physical Activity:   . Days of Exercise per Week: Not on file  . Minutes of Exercise per Session: Not on file  Stress:   . Feeling of Stress : Not on file  Social Connections:   . Frequency of Communication with Friends and Family: Not on file  . Frequency of Social Gatherings with Friends and Family: Not on  file  . Attends Religious Services: Not on file  . Active Member of Clubs or Organizations: Not on file  . Attends Archivist Meetings: Not on file  . Marital Status: Not on file   Additional Social History:                         Sleep: Fair  Appetite:  Fair  Current Medications: Current Facility-Administered Medications  Medication Dose Route Frequency Provider Last Rate Last Admin  . acetaminophen (TYLENOL) tablet 650 mg  650 mg Oral Q6H PRN Caroline Sauger, NP   650 mg at 03/20/19 2993  . albuterol (PROVENTIL) (2.5 MG/3ML) 0.083% nebulizer solution 2.5 mg  2.5 mg Inhalation Q6H PRN Garrett Mitchum T, MD      . alum & mag hydroxide-simeth (MAALOX/MYLANTA) 200-200-20 MG/5ML suspension 30 mL  30 mL Oral Q4H PRN Caroline Sauger, NP      . clobetasol ointment (TEMOVATE) 0.05 %   Topical BID Money, Lowry Ram, FNP   Given at 03/20/19 0801  . hydrOXYzine (ATARAX/VISTARIL) tablet 50 mg  50 mg Oral Q6H PRN Deloria Lair, NP   50 mg at 03/20/19 0931  . levothyroxine (SYNTHROID) tablet 50 mcg  50 mcg Oral Q0600 Zera Markwardt, Madie Reno, MD   50 mcg at 03/20/19 [phone]  . lisinopril (ZESTRIL) tablet 20 mg  20 mg Oral Daily Johnn Hai, MD   20 mg at 03/20/19 0801  . lithium carbonate capsule 600 mg  600 mg Oral QHS Ervey Fallin T, MD      . loratadine (CLARITIN) tablet 10 mg  10 mg Oral Daily Cashius Grandstaff, Madie Reno, MD   10 mg at 03/20/19 0801  . magnesium hydroxide (MILK OF MAGNESIA) suspension 30 mL  30 mL Oral Daily PRN Caroline Sauger, NP      . montelukast (SINGULAIR) tablet 10 mg  10 mg Oral QHS Kannan Proia, Madie Reno, MD   10 mg at 03/19/19 2112  . prazosin (MINIPRESS) capsule 4 mg  4 mg Oral QHS Johnn Hai, MD   4 mg at 03/19/19 2159  . [START ON 03/21/2019] sertraline (ZOLOFT) tablet 200 mg  200 mg Oral Daily Ashlin Kreps T, MD        Lab Results: No results found for this or any previous visit (from the past 48 hour(s)).  Blood Alcohol level:  Lab Results  Component  Value Date   [facility]  06/24/2008    <5        LOWEST DETECTABLE LIMIT FOR SERUM ALCOHOL IS 5 mg/dL FOR MEDICAL PURPOSES ONLY    Metabolic Disorder Labs: Lab Results  Component Value Date   HGBA1C 5.1 04/17/2017  MPG 99.67 04/17/2017   MPG 100 02/25/2008   No results found for: PROLACTIN Lab Results  Component Value Date   CHOL 155 04/17/2017   TRIG 135 04/17/2017   HDL 46 04/17/2017   CHOLHDL 3.4 04/17/2017   VLDL 27 04/17/2017   LDLCALC 82 04/17/2017   LDLCALC 93 03/01/2012    Physical Findings: AIMS:  , ,  ,  ,    CIWA:    COWS:     Musculoskeletal: Strength & Muscle Tone: within normal limits Gait & Station: normal Patient leans: N/A  Psychiatric Specialty Exam: Physical Exam  Nursing note and vitals reviewed. Constitutional: She appears well-developed and well-nourished.  HENT:  Head: Normocephalic and atraumatic.  Eyes: Pupils are equal, round, and reactive to light. Conjunctivae are normal.  Cardiovascular: Regular rhythm and normal heart sounds.  Respiratory: Effort normal.  GI: Soft.  Musculoskeletal:        General: Normal range of motion.     Cervical back: Normal range of motion.  Neurological: She is alert.  Skin: Skin is warm and dry.  Psychiatric: Her affect is blunt. Her speech is delayed. She is slowed. Cognition and memory are impaired. She expresses inappropriate judgment. She exhibits a depressed mood. She expresses suicidal ideation. She expresses no homicidal ideation. She expresses no suicidal plans.    Review of Systems  Constitutional: Negative.   HENT: Negative.   Eyes: Negative.   Respiratory: Negative.   Cardiovascular: Negative.   Gastrointestinal: Negative.   Musculoskeletal: Negative.   Skin: Negative.   Neurological: Negative.   Psychiatric/Behavioral: Positive for dysphoric mood.    Blood pressure 132/75, pulse 90, temperature 98.2 F (36.8 C), temperature source Oral, resp. rate 18, height 5\' 4"  (1.626 m), weight (!)  188.2 kg, SpO2 99 %.Body mass index is 71.23 kg/m.  General Appearance: Casual  Eye Contact:  Good  Speech:  Clear and Coherent  Volume:  Normal  Mood:  Euthymic  Affect:  Constricted  Thought Process:  Goal Directed  Orientation:  Full (Time, Place, and Person)  Thought Content:  Rumination  Suicidal Thoughts:  Yes.  without intent/plan  Homicidal Thoughts:  No  Memory:  Immediate;   Fair Recent;   Poor Remote;   Poor  Judgement:  Impaired  Insight:  Shallow  Psychomotor Activity:  Decreased  Concentration:  Concentration: Poor  Recall:  Poor  Fund of Knowledge:  Poor  Language:  Fair  Akathisia:  No  Handed:  Right  AIMS (if indicated):     Assets:  Desire for Improvement Housing Social Support  ADL's:  Intact  Cognition:  Impaired,  Mild  Sleep:  Number of Hours: 5.75     Treatment Plan Summary: Daily contact with patient to assess and evaluate symptoms and progress in treatment, Medication management and Plan Patient wanted to ask about ECT.  I do not think that she would be a good candidate at this point because of the level of anxiety she is having and that she really has not it sounds like had adequate therapy yet.  I reviewed her medicines with her and suggested there were probably a few things we could get rid of her change to help her be less sleepy during the day.  Discontinue guanfacine as not being clearly effective, discontinue the BuSpar which she says she has never found to be effective, change lithium to only evening dosing, increase Zoloft to 200 mg for depression and PTSD.  Urged patient to continue attending groups.  Probable length of stay still in the range of 1 to 2 days as she is still having some suicidal thoughts.  Mordecai Rasmussen, MD 03/20/2019, 4:18 PM

## 2019-03-20 NOTE — Progress Notes (Signed)
D: Patient has had passive SI but less than yesterday and is able to contract for safety. Says she is worried because her medication is not working. Says she is depressed because she did not get to say goodbye to her grandfather before he died. A: Continue to monitor for safety R: Safety maintained

## 2019-03-20 NOTE — Plan of Care (Signed)
Patient voice understanding of [place] Education , able to verbalize understanding . Emotional and mental status  improved .  Working on [employer] , decision making and anxiety. Attending unit programing . Compliant  with medication given and knowledgeable of  her medication . Interacting with peers on   unit . Voice of no placement concerns   Problem: Medication: Goal: Compliance with prescribed medication regimen will improve Outcome: Progressing   Problem: Education: Goal: Ability to make informed decisions regarding treatment will improve Outcome: Progressing   Problem: Safety: Goal: Periods of time without injury will increase Outcome: Progressing   Problem: Education: Goal: Knowledge of [place] [facility] Education information/materials will improve Outcome: Progressing Goal: Emotional status will improve Outcome: Progressing Goal: Mental status will improve Outcome: Progressing Goal: Verbalization of understanding the information provided will improve Outcome: Progressing  Encourage patient participation with unit programming . I

## 2019-03-20 NOTE — Plan of Care (Signed)
  Problem: Education: Goal: Knowledge of [place] [facility] Education information/materials will improve Outcome: Progressing Goal: Emotional status will improve Outcome: Progressing Goal: Mental status will improve Outcome: Progressing Goal: Verbalization of understanding the information provided will improve Outcome: Progressing  D: Patient has had passive SI but less than yesterday and is able to contract for safety. Says she is worried because her medication is not working. Says she is depressed because she did not get to say goodbye to her grandfather before he died. A: Continue to monitor for safety R: Safety maintained

## 2019-03-21 MED ORDER — BENZTROPINE MESYLATE 1 MG PO TABS
0.5000 mg | ORAL_TABLET | Freq: Three times a day (TID) | ORAL | Status: DC
  Administered 2019-03-21 – 2019-03-23 (×6): 0.5 mg via ORAL
  Filled 2019-03-21 (×6): qty 1

## 2019-03-21 MED ORDER — HALOPERIDOL 5 MG PO TABS
5.0000 mg | ORAL_TABLET | Freq: Three times a day (TID) | ORAL | Status: DC
  Administered 2019-03-21 – 2019-03-23 (×6): 5 mg via ORAL
  Filled 2019-03-21 (×6): qty 1

## 2019-03-21 NOTE — BHH Group Notes (Signed)
LCSW Group Therapy Note  03/21/2019 11:51 AM  Type of Therapy/Topic:  Group Therapy:  Emotion Regulation  Participation Level:  Minimal   Description of Group:   The purpose of this group is to assist patients in learning to regulate negative emotions and experience positive emotions. Patients will be guided to discuss ways in which they have been vulnerable to their negative emotions. These vulnerabilities will be juxtaposed with experiences of positive emotions or situations, and patients will be challenged to use positive emotions to combat negative ones. Special emphasis will be placed on coping with negative emotions in conflict situations, and patients will process healthy conflict resolution skills.  Therapeutic Goals: 1. Patient will identify two positive emotions or experiences to reflect on in order to balance out negative emotions 2. Patient will label two or more emotions that they find the most difficult to experience 3. Patient will demonstrate positive conflict resolution skills through discussion and/or role plays  Summary of Patient Progress: Pt was present in group and responded in the beginning to questions probed to the group, but as group progressed pt was more reserved and did not respond much. Pt did report that her goal is to learn how to occupy her mind when she becomes upset. Pt reported that she is grateful for her higher power and her dogs.   Therapeutic Modalities:   Cognitive Behavioral Therapy Feelings Identification Dialectical Behavioral Therapy   Iris Pert, MSW, LCSW Clinical Social Work 03/21/2019 11:51 AM

## 2019-03-21 NOTE — BHH Counselor (Signed)
CSW provided the pt with the contact information for the [facility] at the request of the chaplain.  It was reported to CSW that the pt is struggling with the recent death of her grandfather.  Penni Homans, MSW, LCSW 03/21/2019 1:44 PM

## 2019-03-21 NOTE — Progress Notes (Signed)
Recreation Therapy Notes   Date: 03/21/2019  Time: 9:30 am  Location: Craft room  Behavioral response: Appropriate  Intervention Topic: Relaxation   Discussion/Intervention:  Group content today was focused on relaxation. The group defined relaxation and identified healthy ways to relax. Individuals expressed how much time they spend relaxing. Patients expressed how much their life would be if they did not make time for themselves to relax. The group stated ways they could improve their relaxation techniques in the future.  Individuals participated in the intervention "Time to Relax" where they had a chance to experience different relaxation techniques.  Clinical Observations/Feedback:  Patient came to group and defined relaxation as being calm. She stated that relaxation is important to ease the mind. Individual was social with peers and staff while participating in the intervention.    Yariel Ferraris LRT/CTRS          Hendy Brindle 03/21/2019 11:28 AM

## 2019-03-21 NOTE — Progress Notes (Signed)
   03/21/19 1245  Clinical Encounter Type  Visited With Patient  Visit Type Follow-up;Spiritual support;Social support;Behavioral Health  Referral From Chaplain  Consult/Referral To [employer] visited with patient and they discussed patient not hurting herself as ordered by voices she said are in her head. Chaplain commended patient for not hurting herself. Patient and Chaplain talked about what causes her to want to hurt herself. Chaplain played UNO with patient until a group started. Chaplain told patient she would come back and finish playing UNO. Chaplain offered pastoral presence and empathy. Chaplain gave Hospice grieving counselor information to Child psychotherapist, who in turn gave information to patient.

## 2019-03-21 NOTE — Progress Notes (Signed)
D: Major Depression   A: Afect cheerful lon approach . Noted interacting  With her peers  At breakfast . Appropriate ADL's  Flowing  Breakfast. Patient approach writer questioning  If she was still on Haldol . Stated she was now hearing  Voices telling her to hurt herself   Patient stated she  Was not going to act on the voices.   Encourage patient to share this information with her MD. Patient later to nursing  Station  Voicing  She needed  Something for her nerves .  Patient was able to talk with chaplin during shift concerning her issues and concerns. D: Patient stated slept good last night .Stated appetite good and energy level  low. Stated concentration poor. Stated on Depression scale 7  Hopeless 7 and anxiety 5 .( low 0-10 high) Denies suicidal  homicidal ideations  .  No auditory hallucinations  No pain concerns . Appropriate ADL'S. Interacting with peers and staff.   Encourage patient participation with unit programming . Instruction  Given on  Medication , verbalize understanding.  R: Voice no other concerns. Staff continue to monitor
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
## 2019-03-21 NOTE — Progress Notes (Signed)
Patient alert and oriented x 4, affect is flat,  she denies SI/HI/AVH no distress noted, she appears less anxious she was noted interacting appropriately with peers in  the day room. Patient was noted to appear needy at times and attention seeking with some child like behavior, she often reoriented to reality and offered clarity on issues. Patien was offered emotional support and encouragement, she was complaint with medication regimen,  placed on CPAP machine at night and on a monitor foe safety, 15 minutes safety checks maintained will continue to monitor

## 2019-03-22 NOTE — Plan of Care (Signed)
Pt rates depression, anxiety and hopelessness all at 5/10.  Pt denies SI, HI and AVH. Pt was educated on care plan and encouraged to attend groups. Torrie Mayers RN Problem: Education: Goal: Knowledge of  General Education information/materials will improve Outcome: Progressing Goal: Emotional status will improve Outcome: Progressing Goal: Mental status will improve Outcome: Progressing Goal: Verbalization of understanding the information provided will improve Outcome: Progressing   Problem: Safety: Goal: Periods of time without injury will increase Outcome: Progressing   Problem: Education: Goal: Ability to make informed decisions regarding treatment will improve Outcome: Progressing   Problem: Medication: Goal: Compliance with prescribed medication regimen will improve Outcome: Progressing

## 2019-03-22 NOTE — Progress Notes (Signed)
[facility] MD Progress Note  03/22/2019 3:20 PM Misty [name]  MRN:  [id number] Subjective: Follow-up for this 24 year old with chronic mood symptoms and PTSD.  Patient continues to complain of anxiety but is not appearing to be agitated during the day.  She is out in the day room interacting with others attending groups.  Seems to do well when she is around people and has something to occupy her time.  Denying any current suicidal ideation.  Denying any current psychotic symptoms. Principal Problem: MDD (major depressive disorder), recurrent episode, severe (HCC) Diagnosis: Principal Problem:   MDD (major depressive disorder), recurrent episode, severe (HCC) Active Problems:   GAD (generalized anxiety disorder)   Obstructive sleep apnea   GERD (gastroesophageal reflux disease)   H/O ASTHMA   Hypertension   Morbid obesity (HCC)   Posttraumatic stress disorder  Total Time spent with patient: 30 minutes  Past Psychiatric History: Past history of chronic mental illness and impairment  Past Medical History:  Past Medical History:  Diagnosis Date  . Allergic rhinitis   . Anxiety   . Depression   . GERD (gastroesophageal reflux disease)   . HTN (hypertension) 07/13/12  . IBS (irritable bowel syndrome)   . Obesity   . OSA on CPAP   . Psoriasis     Past Surgical History:  Procedure Laterality Date  . ADENOIDECTOMY    . SINOSCOPY    . TONSILLECTOMY AND ADENOIDECTOMY  Age 41   Deviated septum corrected at same time   Family History:  Family History  Problem Relation Age of Onset  . Depression Mother   . Allergic rhinitis Mother   . Depression Maternal Grandmother   . Diabetes Maternal Grandmother   . Hypertension Maternal Grandmother   . Bipolar disorder Father   . Drug abuse Father   . Hypertension Father    Family Psychiatric  History: See previous Social History:  Social History   Substance and Sexual Activity  Alcohol Use Yes   Comment: socially     Social History    Substance and Sexual Activity  Drug Use No    Social History   Socioeconomic History  . Marital status: Single    Spouse name: Not on file  . Number of children: Not on file  . Years of education: Not on file  . Highest education level: Not on file  Occupational History  . Occupation: STUDENT    Employer: [employer]    Comment: 11th grade [employer]  Tobacco Use  . Smoking status: Never Smoker  . Smokeless tobacco: Never Used  Substance and Sexual Activity  . Alcohol use: Yes    Comment: socially  . Drug use: No  . Sexual activity: Not Currently  Other Topics Concern  . Not on file  Social History Narrative  . Not on file   Social Determinants of Health   Financial Resource Strain:   . Difficulty of Paying Living Expenses: Not on file  Food Insecurity:   . Worried About Programme researcher, broadcasting/film/video in the Last Year: Not on file  . Ran Out of Food in the Last Year: Not on file  Transportation Needs:   . Lack of Transportation (Medical): Not on file  . Lack of Transportation (Non-Medical): Not on file  Physical Activity:   . Days of Exercise per Week: Not on file  . Minutes of Exercise per Session: Not on file  Stress:   . Feeling of Stress : Not on file  Social Connections:   .  Frequency of Communication with Friends and Family: Not on file  . Frequency of Social Gatherings with Friends and Family: Not on file  . Attends Religious Services: Not on file  . Active Member of Clubs or Organizations: Not on file  . Attends Banker Meetings: Not on file  . Marital Status: Not on file   Additional Social History:                         Sleep: Fair  Appetite:  Fair  Current Medications: Current Facility-Administered Medications  Medication Dose Route Frequency Provider Last Rate Last Admin  . acetaminophen (TYLENOL) tablet 650 mg  650 mg Oral Q6H PRN Gillermo Murdoch, NP   650 mg at 03/21/19 2155  . albuterol (PROVENTIL) (2.5 MG/3ML) 0.083%  nebulizer solution 2.5 mg  2.5 mg Inhalation Q6H PRN Ottilie Wigglesworth T, MD      . alum & mag hydroxide-simeth (MAALOX/MYLANTA) 200-200-20 MG/5ML suspension 30 mL  30 mL Oral Q4H PRN Gillermo Murdoch, NP      . benztropine (COGENTIN) tablet 0.5 mg  0.5 mg Oral TID Zyair Rhein T, MD   0.5 mg at 03/22/19 1153  . clobetasol ointment (TEMOVATE) 0.05 %   Topical BID Money, Gerlene Burdock, FNP   Given at 03/22/19 7425  . haloperidol (HALDOL) tablet 5 mg  5 mg Oral TID Bonne Whack, Jackquline Denmark, MD   5 mg at 03/22/19 1151  . hydrOXYzine (ATARAX/VISTARIL) tablet 50 mg  50 mg Oral Q6H PRN Jearld Lesch, NP   50 mg at 03/21/19 2154  . levothyroxine (SYNTHROID) tablet 50 mcg  50 mcg Oral Q0600 Carola Viramontes, Jackquline Denmark, MD   50 mcg at 03/22/19 [phone]  . lisinopril (ZESTRIL) tablet 20 mg  20 mg Oral Daily Malvin Johns, MD   20 mg at 03/22/19 1152  . lithium carbonate capsule 600 mg  600 mg Oral QHS Sunni Richardson, Jackquline Denmark, MD   600 mg at 03/21/19 2154  . loratadine (CLARITIN) tablet 10 mg  10 mg Oral Daily Laurie Penado, Jackquline Denmark, MD   10 mg at 03/22/19 8756  . magnesium hydroxide (MILK OF MAGNESIA) suspension 30 mL  30 mL Oral Daily PRN Gillermo Murdoch, NP      . montelukast (SINGULAIR) tablet 10 mg  10 mg Oral QHS Josip Merolla, Jackquline Denmark, MD   10 mg at 03/21/19 2154  . prazosin (MINIPRESS) capsule 4 mg  4 mg Oral QHS Malvin Johns, MD   4 mg at 03/21/19 2154  . sertraline (ZOLOFT) tablet 200 mg  200 mg Oral Daily Wilson Dusenbery, Jackquline Denmark, MD   200 mg at 03/22/19 4332    Lab Results: No results found for this or any previous visit (from the past 48 hour(s)).  Blood Alcohol level:  Lab Results  Component Value Date   [facility]  06/24/2008    <5        LOWEST DETECTABLE LIMIT FOR SERUM ALCOHOL IS 5 mg/dL FOR MEDICAL PURPOSES ONLY    Metabolic Disorder Labs: Lab Results  Component Value Date   HGBA1C 5.1 04/17/2017   MPG 99.67 04/17/2017   MPG 100 02/25/2008   No results found for: PROLACTIN Lab Results  Component Value Date   CHOL 155 04/17/2017    TRIG 135 04/17/2017   HDL 46 04/17/2017   CHOLHDL 3.4 04/17/2017   VLDL 27 04/17/2017   LDLCALC 82 04/17/2017   LDLCALC 93 03/01/2012    Physical Findings: AIMS:  , ,  ,  ,  CIWA:    COWS:     Musculoskeletal: Strength & Muscle Tone: within normal limits Gait & Station: normal Patient leans: N/A  Psychiatric Specialty Exam: Physical Exam  Nursing note and vitals reviewed. Constitutional: She appears well-developed and well-nourished.  HENT:  Head: Normocephalic and atraumatic.  Eyes: Pupils are equal, round, and reactive to light. Conjunctivae are normal.  Cardiovascular: Regular rhythm and normal heart sounds.  Respiratory: Effort normal. No respiratory distress.  GI: Soft.  Musculoskeletal:        General: Normal range of motion.     Cervical back: Normal range of motion.  Neurological: She is alert.  Skin: Skin is warm and dry.  Psychiatric: Her speech is normal and behavior is normal. Judgment and thought content normal. Her affect is blunt. Cognition and memory are normal.    Review of Systems  Constitutional: Negative.   HENT: Negative.   Eyes: Negative.   Respiratory: Negative.   Cardiovascular: Negative.   Gastrointestinal: Negative.   Musculoskeletal: Negative.   Skin: Negative.   Neurological: Negative.   Psychiatric/Behavioral: Positive for dysphoric mood.    Blood pressure 123/77, pulse 72, temperature 97.8 F (36.6 C), temperature source Oral, resp. rate 17, height 5\' 4"  (1.626 m), weight (!) 188.2 kg, SpO2 100 %.Body mass index is 71.23 kg/m.  General Appearance: Casual  Eye Contact:  Good  Speech:  Slow  Volume:  Decreased  Mood:  Anxious  Affect:  Appropriate  Thought Process:  Goal Directed  Orientation:  Full (Time, Place, and Person)  Thought Content:  Logical  Suicidal Thoughts:  No  Homicidal Thoughts:  No  Memory:  Immediate;   Fair Recent;   Fair Remote;   Fair  Judgement:  Fair  Insight:  Fair  Psychomotor Activity:   Normal  Concentration:  Concentration: Fair  Recall:  [employer] of Knowledge:  Fair  Language:  Fair  Akathisia:  No  Handed:  Right  AIMS (if indicated):     Assets:  Desire for Improvement Housing Physical Health Social Support  ADL's:  Intact  Cognition:  WNL  Sleep:  Number of Hours: 7.75     Treatment Plan Summary: Daily contact with patient to assess and evaluate symptoms and progress in treatment, Medication management and Plan Supportive therapy and review of medication.  Encouragement once again to stay focused on small goals to achieve a bigger long-term success.  Plan remains for discharge tomorrow.  Alethia Berthold, MD 03/22/2019, 3:20 PM

## 2019-03-22 NOTE — BHH Group Notes (Signed)
LCSW Group Therapy Note  03/22/2019 1:00 PM  Type of Therapy/Topic:  Group Therapy:  Balance in Life  Participation Level:  None  Description of Group:    This group will address the concept of balance and how it feels and looks when one is unbalanced. Patients will be encouraged to process areas in their lives that are out of balance and identify reasons for remaining unbalanced. Facilitators will guide patients in utilizing problem-solving interventions to address and correct the stressor making their life unbalanced. Understanding and applying boundaries will be explored and addressed for obtaining and maintaining a balanced life. Patients will be encouraged to explore ways to assertively make their unbalanced needs known to significant others in their lives, using other group members and facilitator for support and feedback.  Therapeutic Goals: 1. Patient will identify two or more emotions or situations they have that consume much of in their lives. 2. Patient will identify signs/triggers that life has become out of balance:  3. Patient will identify two ways to set boundaries in order to achieve balance in their lives:  4. Patient will demonstrate ability to communicate their needs through discussion and/or role plays  Summary of Patient Progress: Pt was present for the beginning of group, however, pt left group before group discussion began.    Therapeutic Modalities:   Cognitive Behavioral Therapy Solution-Focused Therapy Assertiveness Training  Penni Homans MSW, LCSW 03/22/2019 11:41 AM

## 2019-03-22 NOTE — Progress Notes (Signed)
Recreation Therapy Notes  Date: 03/22/2019  Time: 9:30 am  Location: Room 21   Behavioral response: Appropriate   Intervention Topic: Animal Assisted Therapy   Discussion/Intervention:  Animal Assisted Therapy took place today during group.  Animal Assisted Therapy is the planned inclusion of an animal in a patient's treatment plan. The patients were able to engage in therapy with an animal during group. Participants were educated on what a service dog is and the different between a support dog and a service dog. Patient were informed on the many animal needs there are and how their needs are similar. Individuals were enlightened on the process to get a service animal or support animal. Patients got the opportunity to pet the animal and were offered emotional support from the animal and staff.  Clinical Observations/Feedback:  Patient came to group and was focused on the group intervention. She expressed that she has dogs at home. Individual was social with peers, staff and animal.  Korianna Washer LRT/CTRS         Hildred Mollica 03/22/2019 11:59 AM

## 2019-03-22 NOTE — Progress Notes (Signed)
[place] visited pt. per referral from [facility] and to continue care provided by unit [place].  Pt. in common space playing Uno w/staff when [facility] arrived; pt. amenable to talking --[employer] and pt. met in pt.'s rm. Pt. shared that her grandfather died in 01/25/23 from [place]; explained that [facility] has been meeting w/her and that she hopes to be able to go to grief counseling after she's discharged; she shared that she lives alone in [place] and hasn't had a chance to make many friends, partially due to Covid; she has two dogs which are currently being taken care of by her mother.  Pt. shared that being in [facility] unit @ [facility] was initially intimidating but that meeting other people who struggle with mental health has made her feel 'less alone'; she hopes to keep in contact with pts she has befriended in [facility] after discharge.  [facility] explained that [facility] would likely follow up tomorrow; pt. grateful for [facility] visit.  No further needs expressed at this time.        03/22/19 1730  Clinical Encounter Type  Visited With Patient  Visit Type Follow-up;Psychological support;Behavioral Health  Referral From Chaplain  Consult/Referral To Chaplain  Spiritual Encounters  Spiritual Needs Emotional;Grief support  Stress Factors  Patient Stress Factors Family relationships;Loss of control;Major life changes;Loss

## 2019-03-22 NOTE — Tx Team (Signed)
:Interdisciplinary Treatment and Diagnostic Plan Update  03/22/2019 Time of Session: 830am Misty [name] MRN: [id number]  Principal Diagnosis: MDD (major depressive disorder), recurrent episode, severe (HCC)  Secondary Diagnoses: Principal Problem:   MDD (major depressive disorder), recurrent episode, severe (HCC) Active Problems:   GAD (generalized anxiety disorder)   Obstructive sleep apnea   GERD (gastroesophageal reflux disease)   H/O ASTHMA   Hypertension   Morbid obesity (HCC)   Posttraumatic stress disorder   Current Medications:  Current Facility-Administered Medications  Medication Dose Route Frequency Provider Last Rate Last Admin  . acetaminophen (TYLENOL) tablet 650 mg  650 mg Oral Q6H PRN Gillermo Murdoch, NP   650 mg at 03/21/19 2155  . albuterol (PROVENTIL) (2.5 MG/3ML) 0.083% nebulizer solution 2.5 mg  2.5 mg Inhalation Q6H PRN Clapacs, John T, MD      . alum & mag hydroxide-simeth (MAALOX/MYLANTA) 200-200-20 MG/5ML suspension 30 mL  30 mL Oral Q4H PRN Gillermo Murdoch, NP      . benztropine (COGENTIN) tablet 0.5 mg  0.5 mg Oral TID Clapacs, Jackquline Denmark, MD   0.5 mg at 03/22/19 0811  . clobetasol ointment (TEMOVATE) 0.05 %   Topical BID Money, Gerlene Burdock, FNP   Given at 03/22/19 5631  . haloperidol (HALDOL) tablet 5 mg  5 mg Oral TID Clapacs, Jackquline Denmark, MD   5 mg at 03/22/19 0811  . hydrOXYzine (ATARAX/VISTARIL) tablet 50 mg  50 mg Oral Q6H PRN Jearld Lesch, NP   50 mg at 03/21/19 2154  . levothyroxine (SYNTHROID) tablet 50 mcg  50 mcg Oral Q0600 Clapacs, Jackquline Denmark, MD   50 mcg at 03/22/19 [phone]  . lisinopril (ZESTRIL) tablet 20 mg  20 mg Oral Daily Malvin Johns, MD   20 mg at 03/21/19 0744  . lithium carbonate capsule 600 mg  600 mg Oral QHS Clapacs, Jackquline Denmark, MD   600 mg at 03/21/19 2154  . loratadine (CLARITIN) tablet 10 mg  10 mg Oral Daily Clapacs, Jackquline Denmark, MD   10 mg at 03/22/19 2637  . magnesium hydroxide (MILK OF MAGNESIA) suspension 30 mL  30 mL Oral Daily PRN  Gillermo Murdoch, NP      . montelukast (SINGULAIR) tablet 10 mg  10 mg Oral QHS Clapacs, Jackquline Denmark, MD   10 mg at 03/21/19 2154  . prazosin (MINIPRESS) capsule 4 mg  4 mg Oral QHS Malvin Johns, MD   4 mg at 03/21/19 2154  . sertraline (ZOLOFT) tablet 200 mg  200 mg Oral Daily Clapacs, Jackquline Denmark, MD   200 mg at 03/22/19 8588   PTA Medications: Medications Prior to Admission  Medication Sig Dispense Refill Last Dose  . albuterol (VENTOLIN HFA) 108 (90 Base) MCG/ACT inhaler Inhale 2 puffs into the lungs every 4 (four) hours as needed for wheezing or shortness of breath.     . Albuterol Sulfate 2.5 MG/0.5ML NEBU Inhale into the lungs.     . benztropine (COGENTIN) 0.5 MG tablet Take 1 tablet by mouth daily.     . budesonide-formoterol (SYMBICORT) 160-4.5 MCG/ACT inhaler Inhale 1 puff into the lungs 2 (two) times daily.     . busPIRone (BUSPAR) 5 MG tablet Take by mouth.     . clobetasol (TEMOVATE) 0.05 % external solution Apply topically.     . Desogestrel-Ethinyl Estradiol (RECLIPSEN PO) Take 1 tablet by mouth.     . EPINEPHrine 0.3 mg/0.3 mL IJ SOAJ injection Use as directed for life threatening allergic reaction 2 Device 3   .  ferrous sulfate 325 (65 FE) MG tablet Take 1 tablet (325 mg total) by mouth daily with breakfast. (May buy from the over the counter): For anemia 1 tablet 0   . Fluocinolone Acetonide Scalp (DERMA-SMOOTHE/FS SCALP) 0.01 % OIL Apply topically.     Marland Kitchen guanFACINE HCl (INTUNIV PO) Take 2 mg by mouth daily.      . haloperidol (HALDOL) 5 MG tablet Take 1 tablet (5 mg total) by mouth 3 (three) times daily. For mood control 90 tablet 0   . hydrochlorothiazide (HYDRODIURIL) 25 MG tablet Take 1 tablet (25 mg total) by mouth daily. 90 tablet 1   . hydrOXYzine (ATARAX/VISTARIL) 50 MG tablet Take 1 tablet (50 mg total) by mouth every 6 (six) hours as needed for anxiety. 60 tablet 0   . levothyroxine (SYNTHROID) 50 MCG tablet Take 50 mcg by mouth daily before breakfast.     . lisinopril  (ZESTRIL) 40 MG tablet Take 1 tablet (40 mg total) by mouth daily. For high blood pressure 90 tablet 1   . lithium carbonate 300 MG capsule Take 300 mg by mouth 3 (three) times daily with meals.     Marland Kitchen loratadine (CLARITIN) 10 MG tablet Take 1 tablet (10 mg total) by mouth daily. (May buy from over the counter): For allergies 30 tablet 0   . montelukast (SINGULAIR) 10 MG tablet Take 1 tablet (10 mg total) by mouth at bedtime. For asthma     . pantoprazole (PROTONIX) 40 MG tablet Take 40 mg by mouth daily.     . sertraline (ZOLOFT) 100 MG tablet Take 100 mg by mouth daily.     Marland Kitchen spironolactone (ALDACTONE) 25 MG tablet TAKE ONE TABLET BY MOUTH ONCE DAILY 30 tablet 3     Patient Stressors: Health problems Marital or family conflict Medication change or noncompliance  Patient Strengths: Ability for insight General fund of knowledge Motivation for treatment/growth Supportive family/friends  Treatment Modalities: Medication Management, Group therapy, Case management,  1 to 1 session with clinician, Psychoeducation, Recreational therapy.   Physician Treatment Plan for Primary Diagnosis: MDD (major depressive disorder), recurrent episode, severe (HCC) Long Term Goal(s): Improvement in symptoms so as ready for discharge Improvement in symptoms so as ready for discharge   Short Term Goals: Ability to verbalize feelings will improve Ability to disclose and discuss suicidal ideas Ability to demonstrate self-control will improve Ability to maintain clinical measurements within normal limits will improve Compliance with prescribed medications will improve  Medication Management: Evaluate patient's response, side effects, and tolerance of medication regimen.  Therapeutic Interventions: 1 to 1 sessions, Unit Group sessions and Medication administration.  Evaluation of Outcomes: Progressing  Physician Treatment Plan for Secondary Diagnosis: Principal Problem:   MDD (major depressive disorder),  recurrent episode, severe (HCC) Active Problems:   GAD (generalized anxiety disorder)   Obstructive sleep apnea   GERD (gastroesophageal reflux disease)   H/O ASTHMA   Hypertension   Morbid obesity (HCC)   Posttraumatic stress disorder  Long Term Goal(s): Improvement in symptoms so as ready for discharge Improvement in symptoms so as ready for discharge   Short Term Goals: Ability to verbalize feelings will improve Ability to disclose and discuss suicidal ideas Ability to demonstrate self-control will improve Ability to maintain clinical measurements within normal limits will improve Compliance with prescribed medications will improve     Medication Management: Evaluate patient's response, side effects, and tolerance of medication regimen.  Therapeutic Interventions: 1 to 1 sessions, Unit Group sessions and Medication administration.  Evaluation of Outcomes: Progressing   RN Treatment Plan for Primary Diagnosis: MDD (major depressive disorder), recurrent episode, severe ([place]) Long Term Goal(s): Knowledge of disease and therapeutic regimen to maintain health will improve  Short Term Goals: Ability to remain free from injury will improve, Ability to verbalize frustration and anger appropriately will improve, Ability to demonstrate self-control, Ability to participate in decision making will improve, Ability to verbalize feelings will improve, Ability to disclose and discuss suicidal ideas, Ability to identify and develop effective coping behaviors will improve and Compliance with prescribed medications will improve  Medication Management: RN will administer medications as ordered by provider, will assess and evaluate patient's response and provide education to patient for prescribed medication. RN will report any adverse and/or side effects to prescribing provider.  Therapeutic Interventions: 1 on 1 counseling sessions, Psychoeducation, Medication administration, Evaluate responses to  treatment, Monitor vital signs and CBGs as ordered, Perform/monitor CIWA, COWS, AIMS and Fall Risk screenings as ordered, Perform wound care treatments as ordered.  Evaluation of Outcomes: Progressing   LCSW Treatment Plan for Primary Diagnosis: MDD (major depressive disorder), recurrent episode, severe ([place]) Long Term Goal(s): Safe transition to appropriate next level of care at discharge, Engage patient in therapeutic group addressing interpersonal concerns.  Short Term Goals: Engage patient in aftercare planning with referrals and resources, Increase social support, Increase ability to appropriately verbalize feelings, Increase emotional regulation, Facilitate acceptance of mental health diagnosis and concerns, Identify triggers associated with mental health/substance abuse issues and Increase skills for wellness and recovery  Therapeutic Interventions: Assess for all discharge needs, 1 to 1 time with Social worker, Explore available resources and support systems, Assess for adequacy in community support network, Educate family and significant other(s) on suicide prevention, Complete Psychosocial Assessment, Interpersonal group therapy.  Evaluation of Outcomes: Progressing   Progress in Treatment: Attending groups: Yes. Participating in groups: Yes. Taking medication as prescribed: Yes. Toleration medication: Yes. Family/Significant other contact made: Yes, individual(s) contacted:  pts mother Patient understands diagnosis: Yes. Discussing patient identified problems/goals with staff: Yes. Medical problems stabilized or resolved: Yes. Denies suicidal/homicidal ideation: Yes. Issues/concerns per patient self-inventory: No. Other: None  New problem(s) identified: No, Describe:  None  New Short Term/Long Term Goal(s): Detox, elimination of AVH/symptoms of psychosis, medication management for mood stabilization; elimination of SI thoughts; development of comprehensive mental  wellness/sobriety plan.   Patient Goals:  "Not to feel depressed"  Discharge Plan or Barriers: Pt will return home and participate in outpatient services. Update 03/22/19: SPE pamphlet, Mobile Crisis information, and AA/NA information provided to patient for additional community support and resources. Pt will continue to follow up with her current ACT team  Reason for Continuation of Hospitalization: Depression Medication stabilization  Estimated Length of Stay: Tomorrow 03/23/19  Attendees: Patient:  03/22/2019 9:51 AM  Physician: Dr. Weber Cooks MD 03/22/2019 9:51 AM  Nursing:  03/22/2019 9:51 AM  RN Care Manager: 03/22/2019 9:51 AM  Social Worker: Minette Brine Yale Golla LCSW 03/22/2019 9:51 AM  Recreational Therapist:  03/22/2019 9:51 AM  Other:  03/22/2019 9:51 AM  Other:  03/22/2019 9:51 AM  Other: 03/22/2019 9:51 AM    Scribe for Treatment Team: Mariann Laster Sheena Simonis, [place] 03/22/2019 9:51 AM

## 2019-03-22 NOTE — Progress Notes (Signed)
Patient alert and oriented x 4, affect is flat, but she brightens upon approach, she denies SI/HI/AVH no distress noted, she appears less anxious she was seen interacting appropriately with peers in the day room. Patient was noted intrusive at times and needy . Patien was offered emotional support and encouragement, she was complaint with medication regimen, respiratory staff placed her on CPAP machine at night and she is  on a  baby monitor for  safety, 15 minutes safety checks maintained will continue to monitor

## 2019-03-23 LAB — LITHIUM LEVEL: Lithium Lvl: 0.46 mmol/L — ABNORMAL LOW (ref 0.60–1.20)

## 2019-03-23 MED ORDER — SERTRALINE HCL 100 MG PO TABS: 200.0000 mg | ORAL_TABLET | Freq: Every day | ORAL | 1 refills | Status: DC

## 2019-03-23 MED ORDER — ALBUTEROL SULFATE (2.5 MG/3ML) 0.083% IN NEBU: 2.5000 mg | INHALATION_SOLUTION | Freq: Four times a day (QID) | RESPIRATORY_TRACT | 1 refills | Status: DC | PRN

## 2019-03-23 MED ORDER — LISINOPRIL 20 MG PO TABS: 20.0000 mg | ORAL_TABLET | Freq: Every day | ORAL | 1 refills | Status: DC

## 2019-03-23 MED ORDER — LITHIUM CARBONATE 600 MG PO CAPS: 600.0000 mg | ORAL_CAPSULE | Freq: Every day | ORAL | 1 refills | Status: DC

## 2019-03-23 MED ORDER — ALBUTEROL SULFATE HFA 108 (90 BASE) MCG/ACT IN AERS: 2.0000 | INHALATION_SPRAY | RESPIRATORY_TRACT | 1 refills | Status: DC | PRN

## 2019-03-23 MED ORDER — LORATADINE 10 MG PO TABS: 10.0000 mg | ORAL_TABLET | Freq: Every day | ORAL | 1 refills | Status: DC

## 2019-03-23 MED ORDER — LEVOTHYROXINE SODIUM 50 MCG PO TABS: 50.0000 ug | ORAL_TABLET | Freq: Every day | ORAL | 1 refills | Status: AC

## 2019-03-23 MED ORDER — HALOPERIDOL 5 MG PO TABS: 5.0000 mg | ORAL_TABLET | Freq: Three times a day (TID) | ORAL | 1 refills | Status: DC

## 2019-03-23 MED ORDER — MONTELUKAST SODIUM 10 MG PO TABS: 10.0000 mg | ORAL_TABLET | Freq: Every day | ORAL | 1 refills | Status: AC

## 2019-03-23 MED ORDER — PRAZOSIN HCL 2 MG PO CAPS: 4.0000 mg | ORAL_CAPSULE | Freq: Every day | ORAL | 1 refills | Status: DC

## 2019-03-23 MED ORDER — HYDROXYZINE HCL 50 MG PO TABS: 50.0000 mg | ORAL_TABLET | Freq: Four times a day (QID) | ORAL | 1 refills | Status: DC | PRN

## 2019-03-23 MED ORDER — BENZTROPINE MESYLATE 0.5 MG PO TABS: 0.5000 mg | ORAL_TABLET | Freq: Three times a day (TID) | ORAL | 1 refills | Status: DC

## 2019-03-23 NOTE — Discharge Summary (Signed)
Physician Discharge Summary Note  Patient:  Misty [name] is an 24 y.o., female MRN:  [id number] DOB:  1995-10-28 Patient phone:  [phone] (home)  Patient address:   [address] [place] [address],  Total Time spent with patient: 30 minutes  Date of Admission:  03/15/2019 Date of Discharge: 03/23/19  Reason for Admission:  24 year old woman with a history of longstanding mood disorder and PTSD presents after telling her therapist that she was having active thoughts of shooting herself.  Principal Problem: MDD (major depressive disorder), recurrent episode, severe (HCC) Discharge Diagnoses: Principal Problem:   MDD (major depressive disorder), recurrent episode, severe (HCC) Active Problems:   GAD (generalized anxiety disorder)   Obstructive sleep apnea   GERD (gastroesophageal reflux disease)   H/O ASTHMA   Hypertension   Morbid obesity (HCC)   Posttraumatic stress disorder   Past Psychiatric History: Patient has had multiple psychiatric hospitalizations and chronic psychiatric treatment since a young age.  Multiple hospitalizations as a child.  It appears her last hospitalization was about a year and a half ago at [facility].  She has been variously diagnosed as bipolar PTSD and depression.  Looking through the old notes I am not sure I can find any evidence of a manic syndrome or psychotic symptoms particularly not as an adult.  Seems to have more chronic anxiety and depression.  Patient has been on multiple medications over the years.  I reviewed with her the medicine she was on when she last left behavioral hospital which are pretty different than what she is taking right now.  Patient was not a good historian about this.  Had no memory of anything specific about prior medicines.  Told me she had no idea which medicines had ever been helpful for her.  Could not tell me whether any medicines had really helped with her depression.  She did tell me that  she has nightmares frequently and that Minipress actually made them worse.  She has a history of chronic recurrent suicidal thinking but no history of actual suicide attempts  Past Medical History:  Past Medical History:  Diagnosis Date  . Allergic rhinitis   . Anxiety   . Depression   . GERD (gastroesophageal reflux disease)   . HTN (hypertension) 07/13/12  . IBS (irritable bowel syndrome)   . Obesity   . OSA on CPAP   . Psoriasis     Past Surgical History:  Procedure Laterality Date  . ADENOIDECTOMY    . SINOSCOPY    . TONSILLECTOMY AND ADENOIDECTOMY  Age 71   Deviated septum corrected at same time   Family History:  Family History  Problem Relation Age of Onset  . Depression Mother   . Allergic rhinitis Mother   . Depression Maternal Grandmother   . Diabetes Maternal Grandmother   . Hypertension Maternal Grandmother   . Bipolar disorder Father   . Drug abuse Father   . Hypertension Father    Family Psychiatric  History: History of anxiety and depression Social History:  Social History   Substance and Sexual Activity  Alcohol Use Yes   Comment: socially     Social History   Substance and Sexual Activity  Drug Use No    Social History   Socioeconomic History  . Marital status: Single    Spouse name: Not on file  . Number of children: Not on file  . Years of education: Not on file  . Highest education level:  Not on file  Occupational History  . Occupation: STUDENT    Employer: [employer]    Comment: 11th grade [employer]  Tobacco Use  . Smoking status: Never Smoker  . Smokeless tobacco: Never Used  Substance and Sexual Activity  . Alcohol use: Yes    Comment: socially  . Drug use: No  . Sexual activity: Not Currently  Other Topics Concern  . Not on file  Social History Narrative  . Not on file   Social Determinants of Health   Financial Resource Strain:   . Difficulty of Paying Living Expenses: Not on file  Food Insecurity:   . Worried About  Programme researcher, broadcasting/film/video in the Last Year: Not on file  . Ran Out of Food in the Last Year: Not on file  Transportation Needs:   . Lack of Transportation (Medical): Not on file  . Lack of Transportation (Non-Medical): Not on file  Physical Activity:   . Days of Exercise per Week: Not on file  . Minutes of Exercise per Session: Not on file  Stress:   . Feeling of Stress : Not on file  Social Connections:   . Frequency of Communication with Friends and Family: Not on file  . Frequency of Social Gatherings with Friends and Family: Not on file  . Attends Religious Services: Not on file  . Active Member of Clubs or Organizations: Not on file  . Attends Banker Meetings: Not on file  . Marital Status: Not on file    Hospital Course:  Patient remained on the [facility] unit for 7 days. The patient stabilized on medication and therapy. Patient was discharged on Cogentin 0.5 mg 3 times daily, Haldol 5 mg 3 times daily, Vistaril 50 mg every 6 hours as needed, Synthroid 50 mcg daily, lisinopril 20 mg daily, lithium carbonate 600 mg nightly, Claritin 10 mg daily, Singulair 10 mg a nightly, prazosin 4 mg nightly, and Zoloft 200 mg daily. Patient has shown improvement with improved mood, affect, sleep, appetite, and interaction. Patient has attended group and participated. Patient has been seen in the day room interacting with peers and staff appropriately. Patient denies any SI/HI/AVH and contracts for safety. Patient agrees to follow up at [facility] recovery. Patient is provided with prescriptions for their medications upon discharge.  Physical Findings: AIMS:  , ,  ,  ,    CIWA:    COWS:     Musculoskeletal: Strength & Muscle Tone: within normal limits Gait & Station: normal Patient leans: N/A  Psychiatric Specialty Exam: Physical Exam  Nursing note and vitals reviewed. Constitutional: She is oriented to person, place, and time. She appears well-developed and well-nourished.  Cardiovascular:  Normal rate.  Respiratory: Effort normal.  Musculoskeletal:        General: Normal range of motion.  Neurological: She is alert and oriented to person, place, and time.  Skin: Skin is warm.    Review of Systems  Constitutional: Negative.   HENT: Negative.   Eyes: Negative.   Respiratory: Negative.   Cardiovascular: Negative.   Gastrointestinal: Negative.   Genitourinary: Negative.   Musculoskeletal: Negative.   Skin: Negative.   Neurological: Negative.   Psychiatric/Behavioral: Negative.     Blood pressure 118/63, pulse 67, temperature 98.1 F (36.7 C), temperature source Oral, resp. rate 17, height 5\' 4"  (1.626 m), weight (!) 188.2 kg, SpO2 98 %.Body mass index is 71.23 kg/m.   General Appearance: Casual  Eye Contact::  Good  Speech:  Clear  and Coherent409  Volume:  Normal  Mood:  Euthymic  Affect:  Constricted  Thought Process:  Coherent  Orientation:  Full (Time, Place, and Person)  Thought Content:  Logical  Suicidal Thoughts:  No  Homicidal Thoughts:  No  Memory:  Immediate;   Fair Recent;   Fair Remote;   Fair  Judgement:  Fair  Insight:  Fair  Psychomotor Activity:  Normal  Concentration:  Fair  Recall:  Fair  Fund of Knowledge:Fair  Language: Fair  Akathisia:  No  Handed:  Right  AIMS (if indicated):     Assets:  Desire for Improvement  Sleep:  Number of Hours: 7.5  Cognition: WNL  ADL's:  Intact   Have you used any form of tobacco in the last 30 days? (Cigarettes, Smokeless Tobacco, Cigars, and/or Pipes): No  Has this patient used any form of tobacco in the last 30 days? (Cigarettes, Smokeless Tobacco, Cigars, and/or Pipes) Yes, No  Blood Alcohol level:  Lab Results  Component Value Date   [facility]  06/24/2008    <5        LOWEST DETECTABLE LIMIT FOR SERUM ALCOHOL IS 5 mg/dL FOR MEDICAL PURPOSES ONLY    Metabolic Disorder Labs:  Lab Results  Component Value Date   HGBA1C 5.1 04/17/2017   MPG 99.67 04/17/2017   MPG 100 02/25/2008   No  results found for: PROLACTIN Lab Results  Component Value Date   CHOL 155 04/17/2017   TRIG 135 04/17/2017   HDL 46 04/17/2017   CHOLHDL 3.4 04/17/2017   VLDL 27 04/17/2017   LDLCALC 82 04/17/2017   LDLCALC 93 03/01/2012    See Psychiatric Specialty Exam and Suicide Risk Assessment completed by Attending Physician prior to discharge.  Discharge destination:  Home  Is patient on multiple antipsychotic therapies at discharge:  No   Has Patient had three or more failed trials of antipsychotic monotherapy by history:  No  Recommended Plan for Multiple Antipsychotic Therapies: NA  Discharge Instructions    Diet - low sodium heart healthy   Complete by: As directed    Increase activity slowly   Complete by: As directed      Allergies as of 03/23/2019      Reactions   Other Rash   Other reaction(s): Other (See Comments) Other reaction(s): Rash Must wear at least sterling silver jewelry   Ultram [tramadol] Rash   Pt reports itching as well   Etanercept    Molds & Smuts    Ziprasidone    Adhesive [tape] Rash      Medication List    STOP taking these medications   Albuterol Sulfate 2.5 MG/0.5ML Nebu Replaced by: albuterol (2.5 MG/3ML) 0.083% nebulizer solution You also have another medication with the same name that you need to continue taking as instructed.   budesonide-formoterol 160-4.5 MCG/ACT inhaler Commonly known as: SYMBICORT   busPIRone 5 MG tablet Commonly known as: BUSPAR   Derma-Smoothe/FS Scalp 0.01 % Oil Generic drug: Fluocinolone Acetonide Scalp   ferrous sulfate 325 (65 FE) MG tablet   hydrochlorothiazide 25 MG tablet Commonly known as: HYDRODIURIL   INTUNIV PO   pantoprazole 40 MG tablet Commonly known as: PROTONIX   RECLIPSEN PO   spironolactone 25 MG tablet Commonly known as: ALDACTONE     TAKE these medications     Indication  albuterol 108 (90 Base) MCG/ACT inhaler Commonly known as: VENTOLIN HFA Inhale 2 puffs into the lungs  every 4 (four) hours as needed for wheezing  or shortness of breath. What changed:   Another medication with the same name was added. Make sure you understand how and when to take each.  Another medication with the same name was removed. Continue taking this medication, and follow the directions you see here.  Indication: Asthma   albuterol (2.5 MG/3ML) 0.083% nebulizer solution Commonly known as: PROVENTIL Inhale 3 mLs (2.5 mg total) into the lungs every 6 (six) hours as needed for shortness of breath. What changed: You were already taking a medication with the same name, and this prescription was added. Make sure you understand how and when to take each. Replaces: Albuterol Sulfate 2.5 MG/0.5ML Nebu  Indication: Spasm of Lung Air Passages   benztropine 0.5 MG tablet Commonly known as: COGENTIN Take 1 tablet (0.5 mg total) by mouth 3 (three) times daily. What changed: when to take this  Indication: Extrapyramidal Reaction caused by Medications   clobetasol 0.05 % external solution Commonly known as: TEMOVATE Apply topically.  Indication: Itching   EPINEPHrine 0.3 mg/0.3 mL Soaj injection Commonly known as: EPI-PEN Use as directed for life threatening allergic reaction  Indication: Life-Threatening Hypersensitivity Reaction   haloperidol 5 MG tablet Commonly known as: HALDOL Take 1 tablet (5 mg total) by mouth 3 (three) times daily. What changed: additional instructions  Indication: Mood stability   hydrOXYzine 50 MG tablet Commonly known as: ATARAX/VISTARIL Take 1 tablet (50 mg total) by mouth every 6 (six) hours as needed for anxiety.  Indication: Feeling Anxious   levothyroxine 50 MCG tablet Commonly known as: SYNTHROID Take 1 tablet (50 mcg total) by mouth daily at 6 (six) AM. Start taking on: March 24, 2019 What changed: when to take this  Indication: Underactive Thyroid   lisinopril 20 MG tablet Commonly known as: ZESTRIL Take 1 tablet (20 mg total) by mouth  daily. Start taking on: March 24, 2019 What changed:   medication strength  how much to take  additional instructions  Indication: High Blood Pressure Disorder   lithium 600 MG capsule Take 1 capsule (600 mg total) by mouth at bedtime. What changed:   medication strength  how much to take  when to take this  Indication: Mood stability   loratadine 10 MG tablet Commonly known as: CLARITIN Take 1 tablet (10 mg total) by mouth daily. Start taking on: March 24, 2019 What changed: additional instructions  Indication: Hayfever   montelukast 10 MG tablet Commonly known as: SINGULAIR Take 1 tablet (10 mg total) by mouth at bedtime. What changed: additional instructions  Indication: Asthma   prazosin 2 MG capsule Commonly known as: MINIPRESS Take 2 capsules (4 mg total) by mouth at bedtime.  Indication: High Blood Pressure Disorder, Frightening Dreams   sertraline 100 MG tablet Commonly known as: ZOLOFT Take 2 tablets (200 mg total) by mouth daily. Start taking on: March 24, 2019 What changed: how much to take  Indication: Major Depressive Disorder      Follow-up Information    [facility] Follow up.   Why: Your ACT team will call you 03/23/19 to follow up about dsicharge and schedule in person session with you. Thank You! Contact information: [address] [place], [place] [address] Phone: [phone] Fax: [phone]          Follow-up recommendations:  Continue activity as tolerated. Continue diet as recommended by your PCP. Ensure to keep all appointments with outpatient providers.  Comments:  Patient is instructed prior to discharge to: Take all medications as prescribed by his/her  mental healthcare provider. Report any adverse effects and or reactions from the medicines to his/her outpatient provider promptly. Patient has been instructed & cautioned: To not engage in alcohol and or illegal drug use while on prescription medicines. In the  event of worsening symptoms, patient is instructed to call the crisis hotline, 911 and or go to the nearest ED for appropriate evaluation and treatment of symptoms. To follow-up with his/her primary care provider for your other medical issues, concerns and or health care needs.    Signed: Gerlene Burdock Loney Domingo, FNP 03/23/2019, 9:07 AM

## 2019-03-23 NOTE — Progress Notes (Addendum)
   03/23/19 1100  Clinical Encounter Type  Visited With Patient  Visit Type Follow-up  Referral From Chaplain  Consult/Referral To Chaplain  Chaplain visited with patient. Patient is nervous about being discharged. Chaplain encouraged patient to have confidence in herself. Patient shared that she is happy, but not use to feeling happy. Chaplain encouraged patient to enjoy how she is feeling and when she feels down, to think back on this happy time and this time with a big smile on her face. Chaplain gave patient a puzzle of dogs, since dogs are dear to patient. Patient is being discharged today and going to her mother's where there are four dogs, two of which belong to patient. Chaplain encouraged patient to plan out her day every day and to keep busy, so not to get depressed. Chaplain also encouraged patient to contact the [facility] counselor. Chaplain offered pastoral presence, empathy, and prayer.

## 2019-03-23 NOTE — Progress Notes (Signed)
  [facility] Adult Case Management Discharge Plan :  Will you be returning to the same living situation after discharge:  No. At discharge, do you have transportation home?: Yes,  safe transport Do you have the ability to pay for your medications: Yes,  medicaid  Release of information consent forms completed and in the chart;    Patient to Follow up at: Follow-up Information    [facility] Follow up.   Why: Your ACT team will call you 03/23/19 to follow up about dsicharge and schedule in person session with you. Thank You! Contact information: [address] [place], [place] [address] Phone: [phone] Fax: [phone]          Next level of care provider has access to [facility] Link:no  Safety Planning and Suicide Prevention discussed: Yes,  SPE completed with pts mother  Have you used any form of tobacco in the last 30 days? (Cigarettes, Smokeless Tobacco, Cigars, and/or Pipes): No  Has patient been referred to the Quitline?: Patient refused referral  Patient has been referred for addiction treatment: N/A  Mechele Dawley, LCSW 03/23/2019, 9:44 AM

## 2019-03-23 NOTE — Progress Notes (Signed)
Patient denies SI/HI, denies A/V hallucinations. Patient verbalizes understanding of discharge instructions, follow up care and prescriptions. Patient given all belongings from [facility] locker. Patient escorted out by staff, transported by transport team. 

## 2019-03-23 NOTE — Progress Notes (Signed)
Recreation Therapy Notes   Date: 03/23/2019  Time: 9:30 am  Location: Craft Room  Behavioral response: Appropriate   Intervention Topic: Communication   Discussion/Intervention:  Group content today was focused on communication. The group defined communication and ways to communicate with others. Individuals stated reason why communication is important and some reasons to communicate with others. Patients expressed if they thought they were good at communicating with others and ways they could improve their communication skills. The group identified important parts of communication and some experiences they have had in the past with communication. The group participated in the intervention "Words in a Bag", where they had a chance to test out their communication skills and identify ways to improve their communication techniques.  Clinical Observations/Feedback:  Patient came to group and expressed communication can be done by using body language. Individual was social with peers and staff while participating in the intervention.   Carry Ortez LRT/CTRS         Phyllistine Domingos 03/23/2019 12:30 PM

## 2019-03-23 NOTE — Plan of Care (Signed)
  Problem: Education: Goal: Ability to make informed decisions regarding treatment will improve Outcome: Progressing  Patient denies SI/HI/AVH able to make informed decisions about her treatment plan.

## 2019-03-23 NOTE — BHH Suicide Risk Assessment (Signed)
[facility] Discharge Suicide Risk Assessment   Principal Problem: MDD (major depressive disorder), recurrent episode, severe (HCC) Discharge Diagnoses: Principal Problem:   MDD (major depressive disorder), recurrent episode, severe (HCC) Active Problems:   GAD (generalized anxiety disorder)   Obstructive sleep apnea   GERD (gastroesophageal reflux disease)   H/O ASTHMA   Hypertension   Morbid obesity (HCC)   Posttraumatic stress disorder   Total Time spent with patient: 30 minutes  Musculoskeletal: Strength & Muscle Tone: within normal limits Gait & Station: normal Patient leans: N/A  Psychiatric Specialty Exam: Review of Systems  Constitutional: Negative.   HENT: Negative.   Eyes: Negative.   Respiratory: Negative.   Cardiovascular: Negative.   Gastrointestinal: Negative.   Musculoskeletal: Negative.   Skin: Negative.   Neurological: Negative.   Psychiatric/Behavioral: Negative.     Blood pressure 118/63, pulse 67, temperature 98.1 F (36.7 C), temperature source Oral, resp. rate 17, height 5\' 4"  (1.626 m), weight (!) 188.2 kg, SpO2 98 %.Body mass index is 71.23 kg/m.  General Appearance: Casual  Eye Contact::  Good  Speech:  Clear and Coherent409  Volume:  Normal  Mood:  Euthymic  Affect:  Constricted  Thought Process:  Coherent  Orientation:  Full (Time, Place, and Person)  Thought Content:  Logical  Suicidal Thoughts:  No  Homicidal Thoughts:  No  Memory:  Immediate;   Fair Recent;   Fair Remote;   Fair  Judgement:  Fair  Insight:  Fair  Psychomotor Activity:  Normal  Concentration:  Fair  Recall:  [id number] of Knowledge:Fair  Language: Fair  Akathisia:  No  Handed:  Right  AIMS (if indicated):     Assets:  Desire for Improvement  Sleep:  Number of Hours: 7.5  Cognition: WNL  ADL's:  Intact   Mental Status Per Nursing Assessment::   On Admission:  Suicidal ideation indicated by others  Demographic Factors:  Adolescent or young adult  Loss  Factors: Financial problems/change in socioeconomic status  Historical Factors: Impulsivity  Risk Reduction Factors:   Living with another person, especially a relative, Positive social support and Positive therapeutic relationship  Continued Clinical Symptoms:  Depression:   Impulsivity  Cognitive Features That Contribute To Risk:  None    Suicide Risk:  Minimal: No identifiable suicidal ideation.  Patients presenting with no risk factors but with morbid ruminations; may be classified as minimal risk based on the severity of the depressive symptoms  Follow-up Information    [facility] Follow up.   Why: Your ACT team will call you 03/23/19 to follow up about dsicharge and schedule in person session with you. Thank You! Contact information: [address] [place], [place] Phone: [phone] Fax: [phone]          Plan Of Care/Follow-up recommendations:  Activity:  As tolerated Diet:  Regular Other:  [facility]  [phone], MD 03/23/2019, 10:30 AM

## 2019-03-23 NOTE — Progress Notes (Signed)
Recreation Therapy Notes  INPATIENT RECREATION TR PLAN  Patient Details Name: Misty [name] MRN: [id number] DOB: 18-Jan-1996 Today's Date: 03/23/2019  Rec Therapy Plan Is patient appropriate for Therapeutic Recreation?: Yes Treatment times per week: at least 3 Estimated Length of Stay: 5-7 days TR Treatment/Interventions: Group participation (Comment)  Discharge Criteria Pt will be discharged from therapy if:: Discharged Treatment plan/goals/alternatives discussed and agreed upon by:: Patient/family  Discharge Summary Short term goals set: Patient will engage in groups without prompting or encouragement from LRT x3 group sessions within 5 recreation therapy group sessions Short term goals met: Complete Progress toward goals comments: Groups attended Which groups?: Communication, AAA/T, Leisure education, Other (Comment)(Relaxation) Reason goals not met: N/A Therapeutic equipment acquired: N/A Reason patient discharged from therapy: Discharge from hospital Pt/family agrees with progress & goals achieved: Yes Date patient discharged from therapy: 03/23/19   Alto Gandolfo 03/23/2019, 12:32 PM

## 2019-04-10 ENCOUNTER — Other Ambulatory Visit: Payer: Self-pay | Admitting: Cardiology

## 2019-04-11 ENCOUNTER — Telehealth: Payer: Self-pay | Admitting: Cardiology

## 2019-04-11 NOTE — Telephone Encounter (Signed)
Pt c/o BP issue: STAT if pt c/o blurred vision, one-sided weakness or slurred speech  1. What are your last 5 BP readings?  167/100  2. Are you having any other symptoms (ex. Dizziness, headache, blurred vision, passed out)? Blurry vision  3. What is your BP issue? Pt wanted to know if her feet could go numb as a result of high bp.

## 2019-04-11 NOTE — Telephone Encounter (Signed)
Called patient informed her per Dr. Servando Salina to go be seen in the emergency department she verbally understood no further questions.

## 2019-04-11 NOTE — Telephone Encounter (Signed)
Spoke with pt regarding incoming call with complaints of tingling in her feet and blurred vision.   Pt is asking if high blood pressure can cause tingling in her feet. Pt states she is also having blurry vision, difficulty breathing (she also has asthma and was not sure if this was a factor), and ankle swelling. She denies weight gain or other symptoms. Pt unable to take BP today due to leaving her BP machine at her mother's house.  pt states that she has taken lisinopril, aldactone, and hctz today. No other questions or concerns at this time.  Notified that I would route this message to Dr. Bing Matter for Review.

## 2019-04-11 NOTE — Telephone Encounter (Signed)
Spoke with patient. Patient blood pressure is 164/100. Patient reports blurred vision and numbness in her lower extremities. Advised patient to seek medical attention as soon as possible. Patient verbalized understanding.

## 2019-05-17 ENCOUNTER — Other Ambulatory Visit: Payer: Self-pay | Admitting: Cardiology

## 2019-06-06 ENCOUNTER — Other Ambulatory Visit: Payer: Self-pay | Admitting: Cardiology

## 2019-06-06 ENCOUNTER — Telehealth: Payer: Self-pay | Admitting: Cardiology

## 2019-06-06 NOTE — Telephone Encounter (Signed)
Pt c/o medication issue:  1. Name of Medication: lisinopril (ZESTRIL) 20 MG tablet  2. How are you currently taking this medication (dosage and times per day)? 1 tablet by mouth daily   3. Are you having a reaction (difficulty breathing--STAT)? No   4. What is your medication issue? Misty [name] is calling due to the pharmacy advising her when they sent a request to have lisinopril filled Dr. Bing Matter denied the request and stated she is not supposed to be taking this medication any longer. Misty [name] states no one advised her of this and that she would like a nurse to call her in regards to this to discuss. She also has some questions about aldactone and another medication I was unable to find on her medication list. Please advise.

## 2019-06-07 NOTE — Telephone Encounter (Signed)
Called patient. She reports that she was trying to get her lisinopril 40 mg refilled but it was denied.   Seems it was denied due to her chart reading that she is on lasix 20 mg daily, this change was made on her last admission however the patient never decreased she is still taking 40 mg daily.   Her last admission it looks like her aldactone 25 mg daily and hydrochlorothiazide was also cancelled however she reports she was not made aware of this either and is still taking both.   Will consult with DOD since Dr. Bing Matter is out.

## 2019-06-07 NOTE — Telephone Encounter (Signed)
Follow up  Pt is returning call to follow up her med issue  Please call

## 2019-06-07 NOTE — Telephone Encounter (Signed)
Patient's pcp does not manage these medications. Per Dr. Dulce Sellar patient was scheduled for a visit with Dr. Bing Matter next week and advised to continue her current medication regimen until then patient verbally understood.

## 2019-06-14 ENCOUNTER — Other Ambulatory Visit: Payer: Self-pay

## 2019-06-14 ENCOUNTER — Other Ambulatory Visit: Payer: Self-pay | Admitting: Cardiology

## 2019-06-14 ENCOUNTER — Ambulatory Visit (INDEPENDENT_AMBULATORY_CARE_PROVIDER_SITE_OTHER): Payer: Medicaid Other | Admitting: Cardiology

## 2019-06-14 ENCOUNTER — Encounter: Payer: Self-pay | Admitting: Cardiology

## 2019-06-14 VITALS — BP 118/76 | HR 87 | Ht 63.0 in | Wt >= 6400 oz

## 2019-06-14 DIAGNOSIS — G4733 Obstructive sleep apnea (adult) (pediatric): Secondary | ICD-10-CM

## 2019-06-14 DIAGNOSIS — I10 Essential (primary) hypertension: Secondary | ICD-10-CM

## 2019-06-14 DIAGNOSIS — Z6841 Body Mass Index (BMI) 40.0 and over, adult: Secondary | ICD-10-CM | POA: Diagnosis not present

## 2019-06-14 DIAGNOSIS — F319 Bipolar disorder, unspecified: Secondary | ICD-10-CM | POA: Diagnosis not present

## 2019-06-14 NOTE — Progress Notes (Signed)
Cardiology Office Note:    Date:  06/14/2019   ID:  Misty [name], DOB [birth year], MRN [id number]  PCP:  Nancie Neas, PA  Cardiologist:  [name], MD    Referring MD: Nancie Neas, [place]   No chief complaint on file. Of having palpitations  History of Present Illness:    Misty [name] is a 24 y.o. female with complex past medical history which includes psychiatric history of bipolar disorder, depression, anxiety, essential hypertension, palpitations.  She had multiple tests done to assess her heart so far everything is negative.  She was referred back to [place] for clarification from medication for hypertension.  Luckily her blood pressure seems to be well controlled.  She complained of having palpitations which you mean by that she said when she walks she feels her heart speeding up.  She also used some bronchodilators and she also noted that her heart speeds up after this medication.  I told him this is normal she is morbidly obese and walking with almost strength of the heart and that is why her heart speeds up.  Last echocardiogram reviewed with the patient showed normal left ventricle ejection fraction.  Previously she wore monitor studies not show any significant arrhythmia except for sinus tachycardia.  Past Medical History:  Diagnosis Date  . Allergic rhinitis   . Anxiety   . Depression   . GERD (gastroesophageal reflux disease)   . HTN (hypertension) 07/13/12  . IBS (irritable bowel syndrome)   . Obesity   . OSA on CPAP   . Psoriasis     Past Surgical History:  Procedure Laterality Date  . ADENOIDECTOMY    . SINOSCOPY    . TONSILLECTOMY AND ADENOIDECTOMY  Age 39   Deviated septum corrected at same time    Current Medications: Current Meds  Medication Sig  . albuterol (PROVENTIL) (2.5 MG/3ML) 0.083% nebulizer solution Inhale 3 mLs (2.5 mg total) into the lungs every 6 (six) hours as needed for shortness of breath.  Marland Kitchen albuterol (VENTOLIN HFA) 108 (90 Base)  MCG/ACT inhaler Inhale 2 puffs into the lungs every 4 (four) hours as needed for wheezing or shortness of breath.  . benztropine (COGENTIN) 1 MG tablet Take 1 mg by mouth 2 (two) times daily.  Marland Kitchen buPROPion (WELLBUTRIN XL) 150 MG 24 hr tablet Take 150 mg by mouth daily.  Marland Kitchen EPINEPHrine 0.3 mg/0.3 mL IJ SOAJ injection Use as directed for life threatening allergic reaction  . haloperidol (HALDOL) 5 MG tablet Take 1 tablet (5 mg total) by mouth 3 (three) times daily.  . hydrochlorothiazide (HYDRODIURIL) 25 MG tablet Take 25 mg by mouth daily.  . hydrOXYzine (ATARAX/VISTARIL) 50 MG tablet Take 1 tablet (50 mg total) by mouth every 6 (six) hours as needed for anxiety.  Marland Kitchen levothyroxine (SYNTHROID) 50 MCG tablet Take 1 tablet (50 mcg total) by mouth daily at 6 (six) AM.  . lisinopril (ZESTRIL) 40 MG tablet Take 40 mg by mouth daily.  Marland Kitchen lithium carbonate 600 MG capsule Take 1 capsule (600 mg total) by mouth at bedtime.  Marland Kitchen loratadine (CLARITIN) 10 MG tablet Take 1 tablet (10 mg total) by mouth daily.  . montelukast (SINGULAIR) 10 MG tablet Take 1 tablet (10 mg total) by mouth at bedtime.  . pantoprazole (PROTONIX) 40 MG tablet Take 40 mg by mouth daily.  . prazosin (MINIPRESS) 2 MG capsule Take 2 capsules (4 mg total) by mouth at bedtime.  . sertraline (ZOLOFT) 100 MG tablet Take 2 tablets (200  mg total) by mouth daily.  Marland Kitchen spironolactone (ALDACTONE) 25 MG tablet Take 25 mg by mouth daily.     Allergies:   Other, Ultram [tramadol], Etanercept, Molds & smuts, Ziprasidone, and Adhesive [tape]   Social History   Socioeconomic History  . Marital status: Single    Spouse name: Not on file  . Number of children: Not on file  . Years of education: Not on file  . Highest education level: Not on file  Occupational History  . Occupation: STUDENT    Employer: [employer]    Comment: 11th grade [employer]  Tobacco Use  . Smoking status: Never Smoker  . Smokeless tobacco: Never Used  Substance and Sexual  Activity  . Alcohol use: Yes    Comment: socially  . Drug use: No  . Sexual activity: Not Currently  Other Topics Concern  . Not on file  Social History Narrative  . Not on file   Social Determinants of Health   Financial Resource Strain:   . Difficulty of Paying Living Expenses:   Food Insecurity:   . Worried About Programme researcher, broadcasting/film/video in the Last Year:   . Barista in the Last Year:   Transportation Needs:   . Freight forwarder (Medical):   Marland Kitchen Lack of Transportation (Non-Medical):   Physical Activity:   . Days of Exercise per Week:   . Minutes of Exercise per Session:   Stress:   . Feeling of Stress :   Social Connections:   . Frequency of Communication with Friends and Family:   . Frequency of Social Gatherings with Friends and Family:   . Attends Religious Services:   . Active Member of Clubs or Organizations:   . Attends Banker Meetings:   Marland Kitchen Marital Status:      Family History: The patient's family history includes Allergic rhinitis in her mother; Bipolar disorder in her father; Depression in her maternal grandmother and mother; Diabetes in her maternal grandmother; Drug abuse in her father; Hypertension in her father and maternal grandmother. ROS:   Please see the history of present illness.    All 14 point review of systems negative except as described per history of present illness  EKGs/Labs/Other Studies Reviewed:      Recent Labs: 09/12/2018: BUN 12; Creatinine, Ser 0.63; Potassium 3.7; Sodium 141  Recent Lipid Panel    Component Value Date/Time   CHOL 155 04/17/2017 0644   TRIG 135 04/17/2017 0644   HDL 46 04/17/2017 0644   CHOLHDL 3.4 04/17/2017 0644   VLDL 27 04/17/2017 0644   LDLCALC 82 04/17/2017 0644    Physical Exam:    VS:  BP 118/76   Pulse 87   Ht 5\' 3"  (1.6 m)   Wt (!) 420 lb (190.5 kg)   SpO2 97%   BMI 74.40 kg/m     Wt Readings from Last 3 Encounters:  06/14/19 (!) 420 lb (190.5 kg)  02/27/19 (!) 415 lb  (188.2 kg)  11/27/18 (!) 401 lb 9.6 oz (182.2 kg)     GEN:  Well nourished, well developed in no acute distress HEENT: Normal NECK: No JVD; No carotid bruits LYMPHATICS: No lymphadenopathy CARDIAC: RRR, no murmurs, no rubs, no gallops RESPIRATORY:  Clear to auscultation without rales, wheezing or rhonchi  ABDOMEN: Soft, non-tender, non-distended MUSCULOSKELETAL:  No edema; No deformity  SKIN: Warm and dry LOWER EXTREMITIES: no swelling NEUROLOGIC:  Alert and oriented x 3 PSYCHIATRIC:  Normal affect  ASSESSMENT:    1. Essential hypertension   2. Obstructive sleep apnea   3. Bipolar I disorder ([place])   4. BMI 70 and over, adult ([place])   5. Morbid obesity ([place])    PLAN:    In order of problems listed above:  1. Essential hypertension blood pressure presently controlled today.  I will check a Chem-7 today.  We will continue with present medication which includes Aldactone lisinopril musculoskeletal thiazide.  However his kidney function potassium to be checked. 2. Obstructive sleep apnea to be managed by internal medicine team. 3. Bipolar disorder followed by psychiatry.  Recent admission to hospital noted. 4. Morbid obesity which is ongoing problem.  She admits that she is trying to work on trying to move his weight so far not successfully.  He does have a dog named Truman Hayward and she works with a dog 3 times a day I asked him to try doing a little more frequently.  Her weight is actually getting up.  Her BMI is already 70. 5.  Prolonged QT interval on the EKG.  Corrected is 483.  She thinks a lot of psychiatric medication I would avoid addition of any more medication that would prolong QT interval.  Luckily no dizziness no passing out.   Medication Adjustments/Labs and Tests Ordered: Current medicines are reviewed at length with the patient today.  Concerns regarding medicines are outlined above.  Orders Placed This Encounter  Procedures  . Basic metabolic panel  . EKG 12-Lead    Medication changes: No orders of the defined types were placed in this encounter.   Signed, Park Liter, MD, [facility] 06/14/2019 11:48 AM    [place]

## 2019-06-14 NOTE — Patient Instructions (Signed)

## 2019-06-15 LAB — BASIC METABOLIC PANEL
BUN/Creatinine Ratio: 22 (ref 9–23)
BUN: 13 mg/dL (ref 6–20)
CO2: 22 mmol/L (ref 20–29)
Calcium: 9.5 mg/dL (ref 8.7–10.2)
Chloride: 103 mmol/L (ref 96–106)
Creatinine, Ser: 0.58 mg/dL (ref 0.57–1.00)
GFR calc Af Amer: 149 mL/min/{1.73_m2} (ref >59)
GFR calc non Af Amer: 129 mL/min/{1.73_m2} (ref >59)
Glucose: 86 mg/dL (ref 65–99)
Potassium: 4.4 mmol/L (ref 3.5–5.2)
Sodium: 140 mmol/L (ref 134–144)

## 2019-06-18 ENCOUNTER — Telehealth: Payer: Self-pay | Admitting: Cardiology

## 2019-06-18 NOTE — Telephone Encounter (Signed)
Misty [name] is calling requesting a nurse call her in regards to her lab results that were taken on 06/14/19 so they can be discussed and any necessary medication changes can be made. Please advise.

## 2019-06-19 ENCOUNTER — Other Ambulatory Visit: Payer: Self-pay | Admitting: Emergency Medicine

## 2019-06-19 NOTE — Telephone Encounter (Signed)
Patient informed of results.  

## 2019-07-04 MED ORDER — SPIRONOLACTONE 25 MG PO TABS: 25.0000 mg | ORAL_TABLET | Freq: Every day | ORAL | 1 refills | Status: DC

## 2019-07-04 NOTE — Telephone Encounter (Signed)
Medication filled.  

## 2019-07-04 NOTE — Addendum Note (Signed)
Addended by: Lita Mains on: 07/04/2019 01:16 PM   Modules accepted: Orders

## 2019-07-04 NOTE — Telephone Encounter (Signed)
Ok to refil 

## 2019-07-11 ENCOUNTER — Other Ambulatory Visit (HOSPITAL_COMMUNITY): Payer: Self-pay | Admitting: Psychiatry

## 2019-08-06 ENCOUNTER — Ambulatory Visit: Payer: Medicaid Other | Admitting: Cardiology

## 2019-09-17 ENCOUNTER — Telehealth: Payer: Self-pay | Admitting: Cardiology

## 2019-09-17 NOTE — Telephone Encounter (Signed)
Called patient informed her per Dr. Servando Salina that she needs to go to the emergency room if her shortness of breath gets worse, and I will send call to Dr. Bing Matter for further recommendation on changing meds as she has requested. During the call she  states her swelling is now causing feet to be numb I advised her to go be seen in emergency department for this for now. She verbally understood. No further questions.

## 2019-09-17 NOTE — Telephone Encounter (Signed)
Left message for patient to return call.

## 2019-09-17 NOTE — Telephone Encounter (Signed)
Called patient. She reports she has been having swelling in both legs for a little while now. DVT was recently rule out but the swelling is getting a little worse, she is short of breath and gained 3 lbs since yesterday. She wants recommendation to increase her medication. I advised her I will check with the DOD since Dr. Bing Matter is rounding in the hospital this morning. She is currently taking hydrochlorothiazide 25 mg daily and spironolactone 25 mg daily.

## 2019-09-17 NOTE — Telephone Encounter (Signed)
Patient is returning call.  °

## 2019-09-17 NOTE — Telephone Encounter (Signed)
Pt c/o swelling: STAT is pt has developed SOB within 24 hours  1) How much weight have you gained and in what time span? 5lbs over a couple of weeks  2) If swelling, where is the swelling located? Both legs   3) Are you currently taking a fluid pill? Yes   4) Are you currently SOB? A little bit, started  two weeks ago   5) Do you have a log of your daily weights (if so, list)? No   6) Have you gained 3 pounds in a day or 5 pounds in a week? Gained 3lbs in day.   7) Have you traveled recently? Yes, but states this was happening [place]  She would like for her fluid pills to be increased.

## 2019-09-17 NOTE — Telephone Encounter (Signed)
I suggest if the shortness of breath get worse she needs to go to the emergency department.  Please check with Dr. Bing Matter if he wants to see the patient sooner.

## 2019-09-19 ENCOUNTER — Telehealth: Payer: Self-pay | Admitting: Cardiology

## 2019-09-19 NOTE — Telephone Encounter (Signed)
Called patient. Dr. Bing Matter had a opening this Friday. Scheduled her to see him then.

## 2019-09-19 NOTE — Telephone Encounter (Signed)
New message:    Patient went to ER day before yesterday and stated that she needs a sooner appt. Please call patient back.

## 2019-09-20 ENCOUNTER — Other Ambulatory Visit: Payer: Self-pay

## 2019-09-20 DIAGNOSIS — G4733 Obstructive sleep apnea (adult) (pediatric): Secondary | ICD-10-CM | POA: Insufficient documentation

## 2019-09-20 DIAGNOSIS — K589 Irritable bowel syndrome without diarrhea: Secondary | ICD-10-CM | POA: Insufficient documentation

## 2019-09-20 DIAGNOSIS — F32A Depression, unspecified: Secondary | ICD-10-CM | POA: Insufficient documentation

## 2019-09-20 DIAGNOSIS — F329 Major depressive disorder, single episode, unspecified: Secondary | ICD-10-CM | POA: Insufficient documentation

## 2019-09-20 DIAGNOSIS — L409 Psoriasis, unspecified: Secondary | ICD-10-CM | POA: Insufficient documentation

## 2019-09-20 DIAGNOSIS — E669 Obesity, unspecified: Secondary | ICD-10-CM | POA: Insufficient documentation

## 2019-09-21 ENCOUNTER — Ambulatory Visit: Payer: Medicaid Other | Admitting: Cardiology

## 2019-09-21 ENCOUNTER — Encounter: Payer: Self-pay | Admitting: Cardiology

## 2019-09-21 ENCOUNTER — Other Ambulatory Visit: Payer: Self-pay

## 2019-09-21 VITALS — BP 121/72 | HR 97 | Ht 63.0 in | Wt >= 6400 oz

## 2019-09-21 DIAGNOSIS — G4733 Obstructive sleep apnea (adult) (pediatric): Secondary | ICD-10-CM

## 2019-09-21 DIAGNOSIS — Z9989 Dependence on other enabling machines and devices: Secondary | ICD-10-CM | POA: Diagnosis not present

## 2019-09-21 DIAGNOSIS — I10 Essential (primary) hypertension: Secondary | ICD-10-CM

## 2019-09-21 MED ORDER — FUROSEMIDE 40 MG PO TABS: 40.0000 mg | ORAL_TABLET | Freq: Every day | ORAL | 1 refills | Status: DC

## 2019-09-21 MED ORDER — FUROSEMIDE 40 MG PO TABS
40.0000 mg | ORAL_TABLET | Freq: Every day | ORAL | 1 refills | Status: DC
Start: 2019-09-21 — End: 2019-09-21

## 2019-09-21 NOTE — Patient Instructions (Addendum)
Medication Instructions:  Your physician has recommended you make the following change in your medication:    STOP: hydrochlorothiazide   START: Lasix 40 mg daily    *If you need a refill on your cardiac medications before your next appointment, please call your pharmacy*   Lab Work: Your physician recommends that you return for lab work in 1 week: bmp   If you have labs (blood work) drawn today and your tests are completely normal, you will receive your results only by: Marland Kitchen MyChart Message (if you have MyChart) OR . A paper copy in the mail If you have any lab test that is abnormal or we need to change your treatment, we will call you to review the results.   Testing/Procedures: None.    Follow-Up: At [facility], you and your health needs are our priority.  As part of our continuing mission to provide you with exceptional heart care, we have created designated Provider Care Teams.  These Care Teams include your primary Cardiologist (physician) and Advanced Practice Providers (APPs -  Physician Assistants and Nurse Practitioners) who all work together to provide you with the care you need, when you need it.  We recommend signing up for the patient portal called "MyChart".  Sign up information is provided on this After Visit Summary.  MyChart is used to connect with patients for Virtual Visits (Telemedicine).  Patients are able to view lab/test results, encounter notes, upcoming appointments, etc.  Non-urgent messages can be sent to your provider as well.   To learn more about what you can do with MyChart, go to [email or web address].    Your next appointment:   3 month(s)  The format for your next appointment:   In Person  Provider:   Jenne Campus, MD   Other Instructions  Furosemide Oral Tablets What is this medicine? FUROSEMIDE (fyoor [place] se mide) is a diuretic. It helps you make more urine and to lose salt and excess water from your body. It treats swelling  from heart, kidney, or liver disease. It also treats high blood pressure. This medicine may be used for other purposes; ask your health care provider or pharmacist if you have questions. COMMON BRAND NAME(S): Active-Medicated Specimen Kit, Delone, Diuscreen, Lasix, RX Specimen Collection Kit, Specimen Collection Kit, URINX Medicated Specimen Collection What should I tell my health care provider before I take this medicine? They need to know if you have any of these conditions:  abnormal blood electrolytes  diarrhea or vomiting  gout  heart disease  kidney disease, small amounts of urine, or difficulty passing urine  liver disease  thyroid disease  an unusual or allergic reaction to furosemide, sulfa drugs, other medicines, foods, dyes, or preservatives  pregnant or trying to get pregnant  breast-feeding How should I use this medicine? Take this drug by mouth. Take it as directed on the prescription label at the same time every day. You can take it with or without food. If it upsets your stomach, take it with food. Keep taking it unless your health care provider tells you to stop. Talk to your health care provider about the use of this drug in children. Special care may be needed. Overdosage: If you think you have taken too much of this medicine contact a poison control center or emergency room at once. NOTE: This medicine is only for you. Do not share this medicine with others. What if I miss a dose? If you miss a dose, take it as soon as you  can. If it is almost time for your next dose, take only that dose. Do not take double or extra doses. What may interact with this medicine?  aspirin and aspirin-like medicines  certain antibiotics  chloral hydrate  cisplatin  cyclosporine  digoxin  diuretics  laxatives  lithium  medicines for blood pressure  medicines that relax muscles for surgery  methotrexate  NSAIDs, medicines for pain and inflammation like  ibuprofen, naproxen, or indomethacin  phenytoin  steroid medicines like prednisone or cortisone  sucralfate  thyroid hormones This list may not describe all possible interactions. Give your health care provider a list of all the medicines, herbs, non-prescription drugs, or dietary supplements you use. Also tell them if you smoke, drink alcohol, or use illegal drugs. Some items may interact with your medicine. What should I watch for while using this medicine? Visit your doctor or health care provider for regular checks on your progress. Check your blood pressure regularly. Ask your doctor or health care provider what your blood pressure should be, and when you should contact him or her. If you are a diabetic, check your blood sugar as directed. This medicine may cause serious skin reactions. They can happen weeks to months after starting the medicine. Contact your health care provider right away if you notice fevers or flu-like symptoms with a rash. The rash may be red or purple and then turn into blisters or peeling of the skin. Or, you might notice a red rash with swelling of the face, lips or lymph nodes in your neck or under your arms. You may need to be on a special diet while taking this medicine. Check with your doctor. Also, ask how many glasses of fluid you need to drink a day. You must not get dehydrated. You may get drowsy or dizzy. Do not drive, use machinery, or do anything that needs mental alertness until you know how this drug affects you. Do not stand or sit up quickly, especially if you are an older patient. This reduces the risk of dizzy or fainting spells. Alcohol can make you more drowsy and dizzy. Avoid alcoholic drinks. This medicine can make you more sensitive to the sun. Keep out of the sun. If you cannot avoid being in the sun, wear protective clothing and use sunscreen. Do not use sun lamps or tanning beds/booths. What side effects may I notice from receiving this  medicine? Side effects that you should report to your doctor or health care professional as soon as possible:  blood in urine or stools  dry mouth  fever or chills  hearing loss or ringing in the ears  irregular heartbeat  muscle pain or weakness, cramps  rash, fever, and swollen lymph nodes  redness, blistering, peeling or loosening of the skin, including inside the mouth  skin rash  stomach upset, pain, or nausea  tingling or numbness in the hands or feet  unusually weak or tired  vomiting or diarrhea  yellowing of the eyes or skin Side effects that usually do not require medical attention (report to your doctor or health care professional if they continue or are bothersome):  headache  loss of appetite  unusual bleeding or bruising This list may not describe all possible side effects. Call your doctor for medical advice about side effects. You may report side effects to FDA at 1-800-FDA-1088. Where should I keep my medicine? Keep out of the reach of children and pets. Store at room temperature between 20 and 25 degrees C (  68 and 77 degrees F). Protect from light and moisture. Keep the container tightly closed. Throw away any unused drug after the expiration date. NOTE: This sheet is a summary. It may not cover all possible information. If you have questions about this medicine, talk to your doctor, pharmacist, or health care provider.  2020 Elsevier/Gold Standard (2018-09-12 18:01:32)

## 2019-09-21 NOTE — Progress Notes (Signed)
Cardiology Office Note:    Date:  09/21/2019   ID:  Misty [name], DOB [birth year], MRN [id number]  PCP:  [name], FNP  Cardiologist:  [name], MD    Referring MD: Nancie Neas, [place]   No chief complaint on file. I was in the emergency room  History of Present Illness:    Misty [name] is a 24 y.o. female with complex past medical history.  She does have some depression, anxiety, morbid obesity, obstructive sleep apnea.  I been seeing her for years for different complaints including fatigue tiredness shortness of breath palpitations and lately swelling of lower extremities.  She ended going to the emergency room because of swelling.  Quite extensive appropriate evaluation has been done that included DVT study of lower extremities which was negative, she also got CT of her chest to rule out for PE that was negative as well.  She also have proBNP done which was normal.  She comes today to my office discuss those findings.  Past Medical History:  Diagnosis Date  . Allergic rhinitis   . Anxiety   . Depression   . GERD (gastroesophageal reflux disease)   . HTN (hypertension) 07/13/12  . IBS (irritable bowel syndrome)   . Obesity   . OSA on CPAP   . Psoriasis     Past Surgical History:  Procedure Laterality Date  . ADENOIDECTOMY    . SINOSCOPY    . TONSILLECTOMY AND ADENOIDECTOMY  Age 79   Deviated septum corrected at same time    Current Medications: Current Meds  Medication Sig  . albuterol (PROVENTIL) (2.5 MG/3ML) 0.083% nebulizer solution Inhale 3 mLs (2.5 mg total) into the lungs every 6 (six) hours as needed for shortness of breath.  Marland Kitchen albuterol (VENTOLIN HFA) 108 (90 Base) MCG/ACT inhaler Inhale 2 puffs into the lungs every 4 (four) hours as needed for wheezing or shortness of breath.  . benztropine (COGENTIN) 1 MG tablet Take 1 mg by mouth. 0.5 tablet three times a day  . buPROPion (WELLBUTRIN XL) 150 MG 24 hr tablet Take 150 mg by mouth daily.  .  Diethylpropion HCl CR 75 MG TB24 Take 1 tablet by mouth daily.  . ferrous sulfate (FERROUSUL) 325 (65 FE) MG tablet Take 325 mg by mouth daily with breakfast.  . haloperidol (HALDOL) 5 MG tablet Take 1 tablet (5 mg total) by mouth 3 (three) times daily. (Patient taking differently: Take 5 mg by mouth. Take 1 and 0.5 tablet in am and 1 tablet at night)  . hydrOXYzine (ATARAX/VISTARIL) 50 MG tablet Take 1 tablet (50 mg total) by mouth every 6 (six) hours as needed for anxiety.  Marland Kitchen levothyroxine (SYNTHROID) 50 MCG tablet Take 1 tablet (50 mcg total) by mouth daily at 6 (six) AM.  . lisinopril (ZESTRIL) 40 MG tablet Take 1 tablet (40 mg total) by mouth daily For high blood pressure  . lithium 300 MG tablet Take 300 mg by mouth. Take 1 capsule am and 2 capsule at bedtime  . loratadine (CLARITIN) 10 MG tablet Take 1 tablet (10 mg total) by mouth daily.  . montelukast (SINGULAIR) 10 MG tablet Take 1 tablet (10 mg total) by mouth at bedtime.  . mupirocin ointment (BACTROBAN) 2 % Apply topically 2 (two) times daily.  . pantoprazole (PROTONIX) 40 MG tablet Take 40 mg by mouth daily.  . prazosin (MINIPRESS) 2 MG capsule Take 2 capsules (4 mg total) by mouth at bedtime.  . sertraline (ZOLOFT) 100 MG  tablet Take 2 tablets (200 mg total) by mouth daily.  . SKYRIZI, 150 MG DOSE, 75 MG/0.83ML PSKT SMARTSIG:150 Milligram(s) SUB-Q Every 12 Weeks  . spironolactone (ALDACTONE) 25 MG tablet Take 1 tablet (25 mg total) by mouth daily.  . [DISCONTINUED] hydrochlorothiazide (HYDRODIURIL) 25 MG tablet Take 1 tablet (25 mg total) by mouth daily.     Allergies:   Other, Ultram [tramadol], Etanercept, Molds & smuts, Ziprasidone, and Adhesive [tape]   Social History   Socioeconomic History  . Marital status: Single    Spouse name: Not on file  . Number of children: Not on file  . Years of education: Not on file  . Highest education level: Not on file  Occupational History  . Occupation: STUDENT    Employer: [employer]     Comment: 11th grade [employer]  Tobacco Use  . Smoking status: Never Smoker  . Smokeless tobacco: Never Used  Substance and Sexual Activity  . Alcohol use: Yes    Comment: socially  . Drug use: No  . Sexual activity: Not Currently  Other Topics Concern  . Not on file  Social History Narrative  . Not on file   Social Determinants of Health   Financial Resource Strain:   . Difficulty of Paying Living Expenses:   Food Insecurity:   . Worried About Programme researcher, broadcasting/film/video in the Last Year:   . Barista in the Last Year:   Transportation Needs:   . Freight forwarder (Medical):   Marland Kitchen Lack of Transportation (Non-Medical):   Physical Activity:   . Days of Exercise per Week:   . Minutes of Exercise per Session:   Stress:   . Feeling of Stress :   Social Connections:   . Frequency of Communication with Friends and Family:   . Frequency of Social Gatherings with Friends and Family:   . Attends Religious Services:   . Active Member of Clubs or Organizations:   . Attends Banker Meetings:   Marland Kitchen Marital Status:      Family History: The patient's family history includes Allergic rhinitis in her mother; Bipolar disorder in her father; Depression in her maternal grandmother and mother; Diabetes in her maternal grandmother; Drug abuse in her father; Hypertension in her father and maternal grandmother. ROS:   Please see the history of present illness.    All 14 point review of systems negative except as described per history of present illness  EKGs/Labs/Other Studies Reviewed:      Recent Labs: 06/14/2019: BUN 13; Creatinine, Ser 0.58; Potassium 4.4; Sodium 140  Recent Lipid Panel    Component Value Date/Time   CHOL 155 04/17/2017 0644   TRIG 135 04/17/2017 0644   HDL 46 04/17/2017 0644   CHOLHDL 3.4 04/17/2017 0644   VLDL 27 04/17/2017 0644   LDLCALC 82 04/17/2017 0644    Physical Exam:    VS:  BP 121/72   Pulse 97   Ht 5\' 3"  (1.6 m)   Wt (!) 412  lb (186.9 kg)   SpO2 97%   BMI 72.98 kg/m     Wt Readings from Last 3 Encounters:  09/21/19 (!) 412 lb (186.9 kg)  06/14/19 (!) 420 lb (190.5 kg)  02/27/19 (!) 415 lb (188.2 kg)     GEN:  Well nourished, well developed in no acute distress HEENT: Normal NECK: No JVD; No carotid bruits LYMPHATICS: No lymphadenopathy CARDIAC: RRR, no murmurs, no rubs, no gallops RESPIRATORY:  Clear to  auscultation without rales, wheezing or rhonchi  ABDOMEN: Soft, non-tender, non-distended MUSCULOSKELETAL:  No edema; No deformity  SKIN: Warm and dry LOWER EXTREMITIES: no swelling NEUROLOGIC:  Alert and oriented x 3 PSYCHIATRIC:  Normal affect   ASSESSMENT:    1. Essential hypertension   2. OSA on CPAP    PLAN:    In order of problems listed above:  1. Swelling of lower extremities.  Majority of this is nonpitting edema.  There may be some degree of lymphedema.  I will slightly increase dose of diuretic I will put on furosemide forty with instruction to check Chem-7 today and then Chem-7 next week.  We did talk about keeping her legs elevated we did talk about wearing elastic stockings as well as exercising on the regular basis which can help for the situation. 2. Obstructive sleep apnea to be managed by primary care physician. 3. Essential hypertension: Blood pressure stable.  I did review record from hospital for this visit.   Medication Adjustments/Labs and Tests Ordered: Current medicines are reviewed at length with the patient today.  Concerns regarding medicines are outlined above.  Orders Placed This Encounter  Procedures  . Basic metabolic panel   Medication changes:  Meds ordered this encounter  Medications  . DISCONTD: furosemide (LASIX) 40 MG tablet    Sig: Take 1 tablet (40 mg total) by mouth daily.    Dispense:  90 tablet    Refill:  1  . furosemide (LASIX) 40 MG tablet    Sig: Take 1 tablet (40 mg total) by mouth daily.    Dispense:  30 tablet    Refill:  1     Signed, Georgeanna Lea, MD, [facility] 09/21/2019 4:43 PM    [facility]

## 2019-09-22 LAB — BASIC METABOLIC PANEL
BUN/Creatinine Ratio: 25 — ABNORMAL HIGH (ref 9–23)
BUN: 16 mg/dL (ref 6–20)
CO2: 21 mmol/L (ref 20–29)
Calcium: 9.6 mg/dL (ref 8.7–10.2)
Chloride: 99 mmol/L (ref 96–106)
Creatinine, Ser: 0.63 mg/dL (ref 0.57–1.00)
GFR calc Af Amer: 145 mL/min/{1.73_m2} (ref >59)
GFR calc non Af Amer: 126 mL/min/{1.73_m2} (ref >59)
Glucose: 94 mg/dL (ref 65–99)
Potassium: 4.1 mmol/L (ref 3.5–5.2)
Sodium: 136 mmol/L (ref 134–144)

## 2019-09-24 ENCOUNTER — Ambulatory Visit: Payer: Medicaid Other | Admitting: Cardiology

## 2019-09-25 NOTE — Addendum Note (Signed)
Addended by: Lita Mains on: 09/25/2019 01:29 PM   Modules accepted: Orders

## 2019-09-26 ENCOUNTER — Telehealth: Payer: Self-pay | Admitting: Cardiology

## 2019-09-26 NOTE — Telephone Encounter (Signed)
Spoke to the patient just now and she let me know that she has been having this new onset SOB that is worse than her baseline shortness of breath. She is not noticeably SOB on the phone at this time. She also states that she has swelling in her calves, ankles, feet, as well as her arms and hands. She states that this swelling is worse than her baseline as well. She has been taking her furosemide 40 mg daily as well as her spironolactone 25 mg daily. Patient also reports dizziness and lightheadedness when she stands up. Current BP is 144/61 and a heart rate of 82.   Per Dr. Dulce Sellar the patient was advised to take furosemide 40 mg twice daily for the next two days. Patient was advised to weigh herself daily and to record these weights so that we would have an accurate record of her weight gain or loss. She verbalizes understanding and thanks me for the call back.    Encouraged patient to call back with any questions or concerns.

## 2019-09-26 NOTE — Telephone Encounter (Signed)
Pt c/o swelling: STAT is pt has developed SOB within 24 hours  1) How much weight have you gained and in what time span? Pt has not weighed herself  2) If swelling, where is the swelling located? Feet, legs, hands and arms  3) Are you currently taking a fluid pill? Yes   4) Are you currently SOB? yes  5) Do you have a log of your daily weights (if so, list)? no  6) Have you gained 3 pounds in a day or 5 pounds in a week? Pt does not weigh herself daily  7) Have you traveled recently? No

## 2019-09-27 ENCOUNTER — Telehealth: Payer: Self-pay | Admitting: Cardiology

## 2019-09-27 NOTE — Telephone Encounter (Signed)
Pt c/o swelling: STAT is pt has developed SOB within 24 hours  1) How much weight have you gained and in what time span? Believes she has gained 7 pounds in a week  2) If swelling, where is the swelling located? Feet, legs, arms and hands  3) Are you currently taking a fluid pill? Yes - it was upped yesterday but no relief from swelling   4) Are you currently SOB? Yes - for 2 days now  5) Do you have a log of your daily weights (if so, list)? She states she is normally 410 but today she was 417  6) Have you gained 3 pounds in a day or 5 pounds in a week? Believes she has gained this weight within the last week.   7) Have you traveled recently? No   See phone note from yesterday.

## 2019-09-27 NOTE — Telephone Encounter (Signed)
Called patient. She reports that her swelling is the same since increasing lasix to 40 mg twice daily and she is still short of breath and has gained 7 lbs in 2 days. She had labs drawn today in the office. Per Dr.Krasowski we will wait until the lab results come back tomorrow and go from there. Patient advised to continue lasix 40 mg twice daily for now and go to the emergency room if things get worse. No further questions.

## 2019-09-28 ENCOUNTER — Telehealth: Payer: Self-pay | Admitting: Emergency Medicine

## 2019-09-28 LAB — BASIC METABOLIC PANEL
BUN/Creatinine Ratio: 29 — ABNORMAL HIGH (ref 9–23)
BUN: 18 mg/dL (ref 6–20)
CO2: 23 mmol/L (ref 20–29)
Calcium: 10 mg/dL (ref 8.7–10.2)
Chloride: 98 mmol/L (ref 96–106)
Creatinine, Ser: 0.62 mg/dL (ref 0.57–1.00)
GFR calc Af Amer: 146 mL/min/{1.73_m2} (ref >59)
GFR calc non Af Amer: 127 mL/min/{1.73_m2} (ref >59)
Glucose: 91 mg/dL (ref 65–99)
Potassium: 4.3 mmol/L (ref 3.5–5.2)
Sodium: 135 mmol/L (ref 134–144)

## 2019-09-28 MED ORDER — FUROSEMIDE 40 MG PO TABS: 60.0000 mg | ORAL_TABLET | Freq: Two times a day (BID) | ORAL | 2 refills | Status: DC

## 2019-09-28 NOTE — Telephone Encounter (Signed)
Follow up:      Patient calling back she has some questions concering her medications.

## 2019-09-28 NOTE — Telephone Encounter (Signed)
Called patient informed her of lab results and per Dr. Bing Matter to increase lasix to 60 mg twice daily. She verbally understood no further questions.

## 2019-09-28 NOTE — Telephone Encounter (Signed)
-----   Message from Georgeanna Lea, MD sent at 09/28/2019 10:04 AM EDT ----- Her kidney function looks good her lab work test looking good.  Please find out how much Aldactone and Lasix is she taking

## 2019-09-28 NOTE — Telephone Encounter (Signed)
Patient calling to speak with Hayley.

## 2019-09-28 NOTE — Telephone Encounter (Signed)
Called patient back. She reports that she has still shortness of breath and she has gained two mor pounds since yesterday she said it is harder to breath now. I advised her to go be checked out at urgent care of emergency room she verbally understood no further questions.

## 2019-09-28 NOTE — Telephone Encounter (Signed)
Please see additional encounter.

## 2019-10-04 ENCOUNTER — Telehealth: Payer: Self-pay | Admitting: Cardiology

## 2019-10-04 NOTE — Telephone Encounter (Signed)
Reviewed recommendations with pt. She verbalized understanding and had no additional questions.

## 2019-10-04 NOTE — Telephone Encounter (Signed)
Pt c/o swelling: STAT is pt has developed SOB within 24 hours  1) How much weight have you gained and in what time span? 5 lbs in a week  2) If swelling, where is the swelling located? Legs, feet, arms and hands  3) Are you currently taking a fluid pill? yes  4) Are you currently SOB? no  5) Do you have a log of your daily weights (if so, list)? 410 lbs, 417 lbs, 408 lbs, 413 lbs, 408 lbs, 420 lbs  6) Have you gained 3 pounds in a day or 5 pounds in a week? 5 lbs in a week  7) Have you traveled recently? No   Patient states she has gained 5 lbs in a weeks and is having swelling in her legs, feet, arms, and hands. She states she has also been falling and sometimes her hands and feet go numb. She would like to know if she can be seen sooner than her appointment in November.

## 2019-10-04 NOTE — Telephone Encounter (Signed)
Pt calling today with c/o weight gain, edema and SOB. She denies CP and cough. She is taking her lasix as prescribed and her urine output is normal for her. She states she has been drinking "a lot" of water lately. She states she has avoided sodium today, but it is unclear if she avoids it regularly. I advised her to limit her fluid and sodium intake especially when she notes weight gain.  I will forward to Dr. Bing Matter and his RN for review and recommendation. Pt understands we will contact her back.

## 2019-10-04 NOTE — Telephone Encounter (Signed)
Good advise, she is a difficult patient.  I do not think her swelling which is nonpitting is manageable by diuretic.  So we need to be careful with diet

## 2019-10-05 NOTE — Telephone Encounter (Signed)
Pts Mother calling to clarify exactly how much fluid she should be drinking per day. I advised her a good rule of thumb is to decrease intake by 1/4 of what she would normally drink as well as being mindful when drinking fluids. Only drink when she is thirsty vs drinking fluids just to drink them or out of habit. I reiterated the importance of decreasing sodium and avoiding processed/resturant foods. Keep foods fresh and low sodium. If she does these things, her lasix should help her waste her extra fluid through the weekend.

## 2019-10-05 NOTE — Telephone Encounter (Signed)
Patient calling back.   °

## 2019-10-17 ENCOUNTER — Telehealth: Payer: Self-pay | Admitting: Cardiology

## 2019-10-17 DIAGNOSIS — R609 Edema, unspecified: Secondary | ICD-10-CM

## 2019-10-17 DIAGNOSIS — R0609 Other forms of dyspnea: Secondary | ICD-10-CM

## 2019-10-17 NOTE — Telephone Encounter (Signed)
Called patient. She reports she as gained 5 lbs this week. Her shortness of breath is worse than normal. Her swelling is still present and hasn't decreased with compression stockings. She is currently taking aldactone 25 mg daily and lasix 40 mg twice daily. Will consult with Dr. Bing Matter on how to proceed.

## 2019-10-17 NOTE — Telephone Encounter (Signed)
Pt c/o swelling: STAT is pt has developed SOB within 24 hours  1) How much weight have you gained and in what time span? Past 3 weeks 417/405/410  2) If swelling, where is the swelling located? All over   3) Are you currently taking a fluid pill? Yes   4) Are you currently SOB? Moving or  sitting  5) Do you have a log of your daily weights (if so, list)? 10/16/19 405 today 410  6) Have you gained 3 pounds in a day or 5 pounds in a week? Yes   7) Have you traveled recently? No

## 2019-10-18 NOTE — Telephone Encounter (Signed)
Patient is calling to follow up

## 2019-10-18 NOTE — Telephone Encounter (Signed)
Called patient informed her to have her have labs drawn. She verbally understood no further questions.

## 2019-10-18 NOTE — Telephone Encounter (Signed)
Lets check a proBNP and Chem-7 again, however I do not think I will be able to increase dose of water pill anymore.

## 2019-11-02 LAB — BASIC METABOLIC PANEL
BUN/Creatinine Ratio: 21 (ref 9–23)
BUN: 13 mg/dL (ref 6–20)
CO2: 24 mmol/L (ref 20–29)
Calcium: 9.5 mg/dL (ref 8.7–10.2)
Chloride: 101 mmol/L (ref 96–106)
Creatinine, Ser: 0.63 mg/dL (ref 0.57–1.00)
GFR calc Af Amer: 145 mL/min/{1.73_m2} (ref >59)
GFR calc non Af Amer: 126 mL/min/{1.73_m2} (ref >59)
Glucose: 78 mg/dL (ref 65–99)
Potassium: 4 mmol/L (ref 3.5–5.2)
Sodium: 140 mmol/L (ref 134–144)

## 2019-11-02 LAB — PRO B NATRIURETIC PEPTIDE: NT-Pro BNP: 5 pg/mL (ref 0–130)

## 2019-11-12 ENCOUNTER — Ambulatory Visit: Payer: Medicaid Other | Admitting: Occupational Therapy

## 2019-11-14 ENCOUNTER — Encounter: Payer: Medicaid Other | Admitting: Occupational Therapy

## 2019-11-19 ENCOUNTER — Encounter: Payer: Medicaid Other | Admitting: Occupational Therapy

## 2019-11-21 ENCOUNTER — Encounter: Payer: Medicaid Other | Admitting: Occupational Therapy

## 2019-11-26 ENCOUNTER — Ambulatory Visit: Payer: Medicaid Other | Admitting: Occupational Therapy

## 2019-11-28 ENCOUNTER — Encounter: Payer: Medicaid Other | Admitting: Occupational Therapy

## 2019-12-03 ENCOUNTER — Encounter: Payer: Medicaid Other | Admitting: Occupational Therapy

## 2019-12-05 ENCOUNTER — Encounter: Payer: Medicaid Other | Admitting: Occupational Therapy

## 2019-12-11 ENCOUNTER — Encounter: Payer: Medicaid Other | Admitting: Occupational Therapy

## 2019-12-18 ENCOUNTER — Encounter: Payer: Medicaid Other | Admitting: Occupational Therapy

## 2019-12-20 ENCOUNTER — Other Ambulatory Visit: Payer: Self-pay

## 2019-12-20 DIAGNOSIS — F419 Anxiety disorder, unspecified: Secondary | ICD-10-CM | POA: Insufficient documentation

## 2019-12-21 ENCOUNTER — Ambulatory Visit: Payer: Medicaid Other | Admitting: Cardiology

## 2019-12-21 ENCOUNTER — Encounter: Payer: Self-pay | Admitting: Cardiology

## 2019-12-21 ENCOUNTER — Other Ambulatory Visit: Payer: Self-pay

## 2019-12-21 VITALS — BP 100/80 | HR 110 | Ht 63.0 in | Wt 384.0 lb

## 2019-12-21 DIAGNOSIS — G4733 Obstructive sleep apnea (adult) (pediatric): Secondary | ICD-10-CM

## 2019-12-21 DIAGNOSIS — R000 Tachycardia, unspecified: Secondary | ICD-10-CM | POA: Diagnosis not present

## 2019-12-21 DIAGNOSIS — I10 Essential (primary) hypertension: Secondary | ICD-10-CM

## 2019-12-21 NOTE — Addendum Note (Signed)
Addended by: Delorse Limber I on: 12/21/2019 10:51 AM   Modules accepted: Orders

## 2019-12-21 NOTE — Progress Notes (Signed)
Cardiology Office Note:    Date:  12/21/2019   ID:  Misty [name], DOB 1995/12/03, MRN [id number]  PCP:  [name], FNP  Cardiologist:  [name], MD    Referring MD: [name], FNP   Chief Complaint  Patient presents with  . Follow-up  I need to change my medication since I am trying to get pregnant  History of Present Illness:    Misty [name] is a 24 y.o. female with past medical history significant for morbid obesity, essential hypertension, obstructive sleep apnea, bipolar disorder.  She comes today 2 months for follow-up.  She said that she is trying to get pregnant and she understands some of the medication she takes I am not good for pregnancy and she wants to talk about it.  I did review her cardiac medications as she is taking ACE inhibitor as well as Aldactone.  This medication need to be discontinued however we need to do this gradually.  I asked her to simply cut down lisinopril to half for 2 days and then discontinue and check her blood pressure on the regular basis taken out of it and bring it to me within about 2 weeks.  At that time we will decide if we need to switch to some different medication and what happened minus probably neck calcium channel blocker.  Today her blood pressure is actually low 100/80.  Another medication that we need to discontinue will be Aldactone but again I am going to gradually step-by-step.  Past Medical History:  Diagnosis Date  . Allergic rhinitis   . Anxiety   . Depression   . GERD (gastroesophageal reflux disease)   . HTN (hypertension) 07/13/12  . IBS (irritable bowel syndrome)   . Obesity   . OSA on CPAP   . Psoriasis     Past Surgical History:  Procedure Laterality Date  . ADENOIDECTOMY    . SINOSCOPY    . TONSILLECTOMY AND ADENOIDECTOMY  Age 61   Deviated septum corrected at same time    Current Medications: Current Meds  Medication Sig  . albuterol (PROVENTIL) (2.5 MG/3ML) 0.083% nebulizer solution  Inhale 3 mLs (2.5 mg total) into the lungs every 6 (six) hours as needed for shortness of breath.  Mar[facility] Kitchen albuterol (VENTOLIN HFA) 108 (90 Base) MCG/ACT inhaler Inhale 2 puffs into the lungs every 4 (four) hours as needed for wheezing or shortness of breath.  Mar[place] Kitchen buPROPion (WELLBUTRIN XL) 150 MG 24 hr tablet Take 150 mg by mouth daily.  Mar[place] Kitchen dicyclomine (BENTYL) 10 MG capsule 10 mg as needed. Take 1-2 as needed for abdominal pain  . ferrous sulfate (FERROUSUL) 325 (65 FE) MG tablet Take 325 mg by mouth daily with breakfast.  . furosemide (LASIX) 40 MG tablet Take 1.5 tablets (60 mg total) by mouth 2 (two) times daily.  . hydrOXYzine (ATARAX/VISTARIL) 25 MG tablet Take 25 mg by mouth every 8 (eight) hours as needed for anxiety.  Mar[place] Kitchen levothyroxine (SYNTHROID) 50 MCG tablet Take 1 tablet (50 mcg total) by mouth daily at 6 (six) AM.  . lisinopril (ZESTRIL) 40 MG tablet Take 1 tablet (40 mg total) by mouth daily For high blood pressure  . loratadine (CLARITIN) 10 MG tablet Take 1 tablet (10 mg total) by mouth daily.  . montelukast (SINGULAIR) 10 MG tablet Take 1 tablet (10 mg total) by mouth at bedtime.  . Multiple Vitamins-Minerals (HAIR SKIN AND NAILS FORMULA) TABS Take by mouth daily.  . Multiple Vitamins-Minerals (MULTIVITAMIN WITH MINERALS)  tablet Take 1 tablet by mouth daily.  . pantoprazole (PROTONIX) 40 MG tablet Take 40 mg by mouth daily.  . sertraline (ZOLOFT) 100 MG tablet Take 2 tablets (200 mg total) by mouth daily.  Marland Kitchen spironolactone (ALDACTONE) 25 MG tablet Take 1 tablet (25 mg total) by mouth daily.     Allergies:   Other, Ultram [tramadol], Etanercept, Molds & smuts, Ziprasidone, and Adhesive [tape]   Social History   Socioeconomic History  . Marital status: Single    Spouse name: Not on file  . Number of children: Not on file  . Years of education: Not on file  . Highest education level: Not on file  Occupational History  . Occupation: STUDENT    Employer: [employer]    Comment: 11th grade  [employer]  Tobacco Use  . Smoking status: Never Smoker  . Smokeless tobacco: Never Used  Substance and Sexual Activity  . Alcohol use: Yes    Comment: socially  . Drug use: No  . Sexual activity: Not Currently  Other Topics Concern  . Not on file  Social History Narrative  . Not on file   Social Determinants of Health   Financial Resource Strain:   . Difficulty of Paying Living Expenses: Not on file  Food Insecurity:   . Worried About Programme researcher, broadcasting/film/video in the Last Year: Not on file  . Ran Out of Food in the Last Year: Not on file  Transportation Needs:   . Lack of Transportation (Medical): Not on file  . Lack of Transportation (Non-Medical): Not on file  Physical Activity:   . Days of Exercise per Week: Not on file  . Minutes of Exercise per Session: Not on file  Stress:   . Feeling of Stress : Not on file  Social Connections:   . Frequency of Communication with Friends and Family: Not on file  . Frequency of Social Gatherings with Friends and Family: Not on file  . Attends Religious Services: Not on file  . Active Member of Clubs or Organizations: Not on file  . Attends Banker Meetings: Not on file  . Marital Status: Not on file     Family History: The patient's family history includes Allergic rhinitis in her mother; Bipolar disorder in her father; Depression in her maternal grandmother and mother; Diabetes in her maternal grandmother; Drug abuse in her father; Hypertension in her father and maternal grandmother. ROS:   Please see the history of present illness.    All 14 point review of systems negative except as described per history of present illness  EKGs/Labs/Other Studies Reviewed:      Recent Labs: 11/01/2019: BUN 13; Creatinine, Ser 0.63; NT-Pro BNP 5; Potassium 4.0; Sodium 140  Recent Lipid Panel    Component Value Date/Time   CHOL 155 04/17/2017 0644   TRIG 135 04/17/2017 0644   HDL 46 04/17/2017 0644   CHOLHDL 3.4 04/17/2017  0644   VLDL 27 04/17/2017 0644   LDLCALC 82 04/17/2017 0644    Physical Exam:    VS:  BP 100/80 (BP Location: Left Arm, Patient Position: Sitting, Cuff Size: Large)   Pulse (!) 110   Ht 5\' 3"  (1.6 m)   Wt (!) 384 lb (174.2 kg)   SpO2 97%   BMI 68.02 kg/m     Wt Readings from Last 3 Encounters:  12/21/19 (!) 384 lb (174.2 kg)  09/21/19 (!) 412 lb (186.9 kg)  06/14/19 (!) 420 lb (190.5 kg)  GEN:  Well nourished, well developed in no acute distress HEENT: Normal NECK: No JVD; No carotid bruits LYMPHATICS: No lymphadenopathy CARDIAC: RRR, no murmurs, no rubs, no gallops RESPIRATORY:  Clear to auscultation without rales, wheezing or rhonchi  ABDOMEN: Soft, non-tender, non-distended MUSCULOSKELETAL:  No edema; No deformity  SKIN: Warm and dry LOWER EXTREMITIES: no swelling NEUROLOGIC:  Alert and oriented x 3 PSYCHIATRIC:  Normal affect   ASSESSMENT:    1. Primary hypertension   2. Obstructive sleep apnea   3. Sinus tachycardia    PLAN:    In order of problems listed above:  1. Primary hypertension.  Plan as outlined above.  She is trying to get pregnant we will modify her medications. 2. Obstructive sleep apnea: Use CPAP mask continue present management. 3. Sinus tachycardia is always an issue however now stable it is probably related to her obesity. 4. Dyslipidemia I did review her fasting lipid profile from March of this year showing LDL 82 HDL 48.  That is a reasonable cholesterol profile we will continue present medication, she is not on statin she should not be on statin right now since she is trying to get pregnant.   Medication Adjustments/Labs and Tests Ordered: Current medicines are reviewed at length with the patient today.  Concerns regarding medicines are outlined above.  No orders of the defined types were placed in this encounter.  Medication changes: No orders of the defined types were placed in this encounter.   Signed, Georgeanna Lea, MD,  [facility] 12/21/2019 10:44 AM    [facility]

## 2019-12-21 NOTE — Patient Instructions (Signed)
Medication Instructions:  Your physician has recommended you make the following change in your medication:  STOP: Lisinopril. For the next two days take 1/2 a tablet and then stop completely.  *If you need a refill on your cardiac medications before your next appointment, please call your pharmacy*   Lab Work: None If you have labs (blood work) drawn today and your tests are completely normal, you will receive your results only by: Marland Kitchen MyChart Message (if you have MyChart) OR . A paper copy in the mail If you have any lab test that is abnormal or we need to change your treatment, we will call you to review the results.   Testing/Procedures: None   Follow-Up: At [facility], you and your health needs are our priority.  As part of our continuing mission to provide you with exceptional heart care, we have created designated Provider Care Teams.  These Care Teams include your primary Cardiologist (physician) and Advanced Practice Providers (APPs -  Physician Assistants and Nurse Practitioners) who all work together to provide you with the care you need, when you need it.  We recommend signing up for the patient portal called "MyChart".  Sign up information is provided on this After Visit Summary.  MyChart is used to connect with patients for Virtual Visits (Telemedicine).  Patients are able to view lab/test results, encounter notes, upcoming appointments, etc.  Non-urgent messages can be sent to your provider as well.   To learn more about what you can do with MyChart, go to [email or web address].    Your next appointment:   1 month(s)  The format for your next appointment:   In Person  Provider:   [name], MD   Other Instructions For the next 10 days please take your blood pressure and keep a record of this for [place]. Bring these recordings in for Dr. Bing Matter to review.

## 2019-12-24 ENCOUNTER — Ambulatory Visit: Payer: Medicaid Other | Admitting: Cardiology

## 2019-12-27 ENCOUNTER — Other Ambulatory Visit: Payer: Self-pay | Admitting: Cardiology

## 2020-01-22 ENCOUNTER — Telehealth: Payer: Self-pay | Admitting: Cardiology

## 2020-01-22 NOTE — Telephone Encounter (Signed)
    Pt is calling to follow up, she said she dropped off her BP readings in the office today. She is waiting for Dr. Charm Rings recommendations

## 2020-01-22 NOTE — Telephone Encounter (Signed)
Where is it?

## 2020-01-23 ENCOUNTER — Telehealth: Payer: Self-pay | Admitting: Cardiology

## 2020-01-23 NOTE — Telephone Encounter (Signed)
Called patient. She wanted to make sure she didn't need any more medication for blood pressure based on her readings. Informed her Dr. Bing Matter stated that she did not need to take anymore medication right now. She also informed me she is not taking lisinopril or spironolactone. Dr. Bing Matter aware no further questions.

## 2020-01-23 NOTE — Telephone Encounter (Signed)
See previous call

## 2020-01-23 NOTE — Telephone Encounter (Signed)
New message:     Patient calling concerning some medication that she thinks she need that the doctor told her to call if she was still having issues.

## 2020-02-11 ENCOUNTER — Other Ambulatory Visit: Payer: Self-pay

## 2020-02-13 ENCOUNTER — Ambulatory Visit (INDEPENDENT_AMBULATORY_CARE_PROVIDER_SITE_OTHER): Payer: Medicaid Other | Admitting: Cardiology

## 2020-02-13 ENCOUNTER — Other Ambulatory Visit: Payer: Self-pay

## 2020-02-13 ENCOUNTER — Encounter: Payer: Self-pay | Admitting: Cardiology

## 2020-02-13 VITALS — BP 170/90 | HR 101 | Ht 63.0 in | Wt 387.0 lb

## 2020-02-13 DIAGNOSIS — G4733 Obstructive sleep apnea (adult) (pediatric): Secondary | ICD-10-CM | POA: Diagnosis not present

## 2020-02-13 DIAGNOSIS — I10 Essential (primary) hypertension: Secondary | ICD-10-CM

## 2020-02-13 MED ORDER — HYDRALAZINE HCL 10 MG PO TABS: 10.0000 mg | ORAL_TABLET | Freq: Four times a day (QID) | ORAL | 3 refills | Status: DC

## 2020-02-13 NOTE — Addendum Note (Signed)
Addended by: Hazle Quant on: 02/13/2020 03:01 PM   Modules accepted: Orders

## 2020-02-13 NOTE — Patient Instructions (Signed)
Medication Instructions:  Your physician has recommended you make the following change in your medication:   START: Hydralazine 10 mg 4 times daily   *If you need a refill on your cardiac medications before your next appointment, please call your pharmacy*   Lab Work: None If you have labs (blood work) drawn today and your tests are completely normal, you will receive your results only by: Marland Kitchen MyChart Message (if you have MyChart) OR . A paper copy in the mail If you have any lab test that is abnormal or we need to change your treatment, we will call you to review the results.   Testing/Procedures: None   Follow-Up: At [facility], you and your health needs are our priority.  As part of our continuing mission to provide you with exceptional heart care, we have created designated Provider Care Teams.  These Care Teams include your primary Cardiologist (physician) and Advanced Practice Providers (APPs -  Physician Assistants and Nurse Practitioners) who all work together to provide you with the care you need, when you need it.  We recommend signing up for the patient portal called "MyChart".  Sign up information is provided on this After Visit Summary.  MyChart is used to connect with patients for Virtual Visits (Telemedicine).  Patients are able to view lab/test results, encounter notes, upcoming appointments, etc.  Non-urgent messages can be sent to your provider as well.   To learn more about what you can do with MyChart, go to [email or web address].    Your next appointment:   3 month(s)  The format for your next appointment:   In Person  Provider:   [name], MD   Other Instructions  Please bring bp measurements to [place] within 1-2 weeks.

## 2020-02-13 NOTE — Progress Notes (Signed)
Cardiology Office Note:    Date:  02/13/2020   ID:  Misty [name], DOB 1995/03/09, MRN [id number]  PCP:  [name], FNP  Cardiologist:  [name], MD    Referring MD: [name], FNP   Chief Complaint  Patient presents with  . Hypertension    History of Present Illness:    Misty [name] is a 25 y.o. female complex past medical history which include essential hypertension, morbid obesity, obstructive sleep apnea, palpitations, tachycardia which is sinus.  Recently she came to my office and she would like to have her medication adjusted because she is trying to get pregnant and she is pregnant right now.  We will stop ACE inhibitor's her blood pressure is elevated.  She comes today to talk about this.  Overall she is doing well.  She is excited about being pregnant and she is looking forward towards pregnancy at the same time she is somewhat scared.  She does check her blood pressure on the regular basis she sees systolic numbers between 150 and 160s.  Past Medical History:  Diagnosis Date  . Allergic rhinitis   . Anxiety   . Bipolar 1 disorder (HCC) 04/15/2017  . Bipolar 1 disorder, depressed, severe (HCC) 12/16/2017  . Bipolar I disorder (HCC) 10/10/2015  . BMI 70 and over, adult (HCC) 12/03/2015  . Borderline personality disorder (HCC) 03/14/2015  . Depression   . Fatty liver 10/10/2015  . GAD (generalized anxiety disorder) 07/19/2012  . Gastrointestinal disease 05/16/2013  . GERD (gastroesophageal reflux disease)   . H/O ASTHMA 10/08/2014  . HTN (hypertension) 07/13/12  . Hypertension 05/16/2013  . IBS (irritable bowel syndrome)   . Increased frequency of urination 04/12/2014  . INTERMITTENT URTICARIA 10/08/2014  . Intractable migraine without aura with status migrainosus 05/16/2013  . Iron deficiency 10/10/2015  . Iron deficiency anemia 12/11/2015  . Lithium toxicity 04/05/2016  . 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 MDD (major  depressive disorder) 11/18/2018  . MDD (major depressive disorder), recurrent episode, moderate (HCC) 03/01/2012  . MDD (major depressive disorder), recurrent episode, severe (HCC) 03/15/2019  . Metabolic syndrome 09/04/2012  . Morbid obesity (HCC) 04/03/2015  . Obesity   . Obstructive sleep apnea 07/19/2012  . ODD (oppositional defiant disorder) 07/19/2012  . OSA on CPAP   . Palpitations 07/21/2015  . Posttraumatic stress disorder 09/04/2014  . Psoriasis   . Schizoaffective disorder, depressive type (HCC) 03/23/2017  . Sinus tachycardia 08/07/2014  . Swelling of both lower extremities 12/14/2016  . Urge incontinence of urine 04/12/2014    Past Surgical History:  Procedure Laterality Date  . ADENOIDECTOMY    . SINOSCOPY    . TONSILLECTOMY AND ADENOIDECTOMY  Age 25   Deviated septum corrected at same time    Current Medications: Current Meds  Medication Sig  . albuterol (PROVENTIL) (2.5 MG/3ML) 0.083% nebulizer solution Inhale 3 mLs (2.5 mg total) into the lungs every 6 (six) hours as needed for shortness of breath.  Marland Kitchen albuterol (VENTOLIN HFA) 108 (90 Base) MCG/ACT inhaler Inhale 2 puffs into the lungs every 4 (four) hours as needed for wheezing or shortness of breath.  . Biotin 5000 MCG TABS Take by mouth.  Marland Kitchen buPROPion (WELLBUTRIN XL) 150 MG 24 hr tablet Take 150 mg by mouth daily.  . ferrous sulfate 325 (65 FE) MG tablet Take 325 mg by mouth daily with breakfast.  . furosemide (LASIX) 40 MG tablet Take 1.5 tablets (60 mg total) by  mouth 2 (two) times daily.  Marland Kitchen levothyroxine (SYNTHROID) 50 MCG tablet Take 1 tablet (50 mcg total) by mouth daily at 6 (six) AM.  . loratadine (CLARITIN) 10 MG tablet Take 1 tablet (10 mg total) by mouth daily.  . montelukast (SINGULAIR) 10 MG tablet Take 1 tablet (10 mg total) by mouth at bedtime.  . Multiple Vitamins-Minerals (HAIR SKIN AND NAILS FORMULA) TABS Take by mouth daily.  . pantoprazole (PROTONIX) 40 MG tablet Take 40 mg by mouth daily.  . Prenatal  Vit-Fe Fumarate-FA (PRENATAL MULTIVITAMIN) TABS tablet Take 1 tablet by mouth daily at 12 noon.  . sertraline (ZOLOFT) 100 MG tablet Take 2 tablets (200 mg total) by mouth daily.     Allergies:   Other, Ultram [tramadol], Etanercept, Molds & smuts, Ziprasidone, and Adhesive [tape]   Social History   Socioeconomic History  . Marital status: Single    Spouse name: Not on file  . Number of children: Not on file  . Years of education: Not on file  . Highest education level: Not on file  Occupational History  . Occupation: STUDENT    Employer: [employer]    Comment: 11th grade [employer]  Tobacco Use  . Smoking status: Never Smoker  . Smokeless tobacco: Never Used  Substance and Sexual Activity  . Alcohol use: Yes    Comment: socially  . Drug use: No  . Sexual activity: Not Currently  Other Topics Concern  . Not on file  Social History Narrative  . Not on file   Social Determinants of Health   Financial Resource Strain: Not on file  Food Insecurity: Not on file  Transportation Needs: Not on file  Physical Activity: Not on file  Stress: Not on file  Social Connections: Not on file     Family History: The patient's family history includes Allergic rhinitis in her mother; Bipolar disorder in her father; Depression in her maternal grandmother and mother; Diabetes in her maternal grandmother; Drug abuse in her father; Hypertension in her father and maternal grandmother. ROS:   Please see the history of present illness.    All 14 point review of systems negative except as described per history of present illness  EKGs/Labs/Other Studies Reviewed:      Recent Labs: 11/01/2019: BUN 13; Creatinine, Ser 0.63; NT-Pro BNP 5; Potassium 4.0; Sodium 140  Recent Lipid Panel    Component Value Date/Time   CHOL 155 04/17/2017 0644   TRIG 135 04/17/2017 0644   HDL 46 04/17/2017 0644   CHOLHDL 3.4 04/17/2017 0644   VLDL 27 04/17/2017 0644   LDLCALC 82 04/17/2017 0644    Physical  Exam:    VS:  BP (!) 170/90 (BP Location: Left Arm, Patient Position: Sitting)   Pulse (!) 101   Ht 5\' 3"  (1.6 m)   Wt (!) 387 lb (175.5 kg)   SpO2 97%   BMI 68.55 kg/m     Wt Readings from Last 3 Encounters:  02/13/20 (!) 387 lb (175.5 kg)  12/21/19 (!) 384 lb (174.2 kg)  09/21/19 (!) 412 lb (186.9 kg)     GEN:  Well nourished, well developed in no acute distress HEENT: Normal NECK: No JVD; No carotid bruits LYMPHATICS: No lymphadenopathy CARDIAC: RRR, no murmurs, no rubs, no gallops RESPIRATORY:  Clear to auscultation without rales, wheezing or rhonchi  ABDOMEN: Soft, non-tender, non-distended MUSCULOSKELETAL:  No edema; No deformity  SKIN: Warm and dry LOWER EXTREMITIES: no swelling NEUROLOGIC:  Alert and oriented x 3  PSYCHIATRIC:  Normal affect   ASSESSMENT:    1. Primary hypertension   2. Obstructive sleep apnea   3. Morbid obesity (HCC)    PLAN:    In order of problems listed above:  1. Primary hypertension.  She still takes small dose of diuretics because of chronic swelling of lower extremities I will continue with that.  I will add hydralazine 10 mg 4 times a day to help with the blood pressure gradually try to go up with this medication if that will not be sufficient then nifedipine will be considered.  If that is not enough then we may consider adding labetalol. 2. Obstructive sleep apnea: She does use CPAP mask and this is being directed by primary care physician. 3. Morbid obesity: Obviously a problem.  She understand this and is a long battle for her.   Medication Adjustments/Labs and Tests Ordered: Current medicines are reviewed at length with the patient today.  Concerns regarding medicines are outlined above.  No orders of the defined types were placed in this encounter.  Medication changes: No orders of the defined types were placed in this encounter.   Signed, Georgeanna Lea, MD, [facility] 02/13/2020 2:45 PM    [facility]

## 2020-02-18 ENCOUNTER — Other Ambulatory Visit: Payer: Self-pay | Admitting: Cardiology

## 2020-02-18 ENCOUNTER — Telehealth: Payer: Self-pay | Admitting: Cardiology

## 2020-02-18 NOTE — Telephone Encounter (Signed)
Called patient informed her that she can stop lasix and let us know if she had problems stopping. She will. No further questions at this time.

## 2020-02-18 NOTE — Telephone Encounter (Signed)
Rx refill sent to pharmacy. 

## 2020-02-18 NOTE — Telephone Encounter (Signed)
Patient called and said that OB-GYN told her that she could not take Lasix. Patient is pregnant. Please call for different alternatives.

## 2020-02-18 NOTE — Telephone Encounter (Signed)
Okay, just stopped Lasix

## 2020-04-11 DIAGNOSIS — O99212 Obesity complicating pregnancy, second trimester: Secondary | ICD-10-CM

## 2020-04-11 DIAGNOSIS — L409 Psoriasis, unspecified: Secondary | ICD-10-CM | POA: Insufficient documentation

## 2020-04-11 HISTORY — DX: Obesity complicating pregnancy, second trimester: O99.212

## 2020-05-09 DIAGNOSIS — K589 Irritable bowel syndrome without diarrhea: Secondary | ICD-10-CM | POA: Insufficient documentation

## 2020-05-09 DIAGNOSIS — G4733 Obstructive sleep apnea (adult) (pediatric): Secondary | ICD-10-CM | POA: Insufficient documentation

## 2020-05-09 DIAGNOSIS — F419 Anxiety disorder, unspecified: Secondary | ICD-10-CM | POA: Insufficient documentation

## 2020-05-25 DIAGNOSIS — O0992 Supervision of high risk pregnancy, unspecified, second trimester: Secondary | ICD-10-CM

## 2020-05-25 HISTORY — DX: Supervision of high risk pregnancy, unspecified, second trimester: O09.92

## 2020-05-26 ENCOUNTER — Ambulatory Visit: Payer: Medicaid Other | Admitting: Cardiology

## 2020-05-26 ENCOUNTER — Ambulatory Visit
Admit: 2020-05-26 | Discharge: 2020-05-27 | Payer: MEDICAID | Attending: Advanced Practice Midwife | Primary: Advanced Practice Midwife

## 2020-05-26 DIAGNOSIS — R300 Dysuria: Principal | ICD-10-CM

## 2020-05-26 DIAGNOSIS — I10 Essential (primary) hypertension: Principal | ICD-10-CM

## 2020-05-26 DIAGNOSIS — O0992 Supervision of high risk pregnancy, unspecified, second trimester: Principal | ICD-10-CM

## 2020-05-26 MED ORDER — GUAIFENESIN 400 MG TABLET
ORAL | 0 refills | 0.00000 days | PRN
Start: 2020-05-26 — End: ?

## 2020-05-29 ENCOUNTER — Other Ambulatory Visit: Payer: Self-pay

## 2020-05-31 DIAGNOSIS — F819 Developmental disorder of scholastic skills, unspecified: Secondary | ICD-10-CM | POA: Insufficient documentation

## 2020-05-31 DIAGNOSIS — E039 Hypothyroidism, unspecified: Secondary | ICD-10-CM | POA: Insufficient documentation

## 2020-05-31 DIAGNOSIS — J4550 Severe persistent asthma, uncomplicated: Secondary | ICD-10-CM | POA: Insufficient documentation

## 2020-06-02 ENCOUNTER — Encounter: Payer: Self-pay | Admitting: Cardiology

## 2020-06-02 ENCOUNTER — Ambulatory Visit (INDEPENDENT_AMBULATORY_CARE_PROVIDER_SITE_OTHER): Payer: Medicaid Other | Admitting: Cardiology

## 2020-06-02 ENCOUNTER — Other Ambulatory Visit: Payer: Self-pay

## 2020-06-02 VITALS — BP 120/68 | HR 90 | Ht 63.0 in | Wt 387.0 lb

## 2020-06-02 DIAGNOSIS — I10 Essential (primary) hypertension: Secondary | ICD-10-CM

## 2020-06-02 DIAGNOSIS — M7989 Other specified soft tissue disorders: Secondary | ICD-10-CM

## 2020-06-02 DIAGNOSIS — F319 Bipolar disorder, unspecified: Secondary | ICD-10-CM

## 2020-06-02 DIAGNOSIS — R002 Palpitations: Secondary | ICD-10-CM | POA: Diagnosis not present

## 2020-06-02 NOTE — Patient Instructions (Signed)

## 2020-06-02 NOTE — Progress Notes (Signed)
Cardiology Office Note:    Date:  06/02/2020   ID:  Misty [name], DOB Jan 09, 1996, MRN [id number]  PCP:  [name], FNP  Cardiologist:  [name], MD    Referring MD: [name], FNP   Chief Complaint  Patient presents with  . Medication Management  Doing well  History of Present Illness:    Misty [name] is a 25 y.o. female with past medical history significant for essential hypertension, morbid obesity with BMI more than 60, obstructive sleep apnea, palpitations, tachycardia.  She is pregnant and she is here to make sure her medications are appropriate and management is appropriate.  She is doing well denies have any palpitations no chest pain tightness squeezing pressure burning chest.  She described 1 episode when her blood pressure was low actually 80-90 systolic.  The only medication she takes for controlling her blood pressure is hydralazine 10 mg 4 times daily.  Beta-blocker has been withdrawn.  She is not on an ACE inhibitor/ARB/Entresto.  Overall she seems to be doing well denies have any palpitation that used to be ongoing complain previously.  Seems to be tolerating pregnancy well.  T.  Past Medical History:  Diagnosis Date  . Allergic rhinitis   . Anxiety   . Bipolar 1 disorder (HCC) 04/15/2017  . Bipolar 1 disorder, depressed, severe (HCC) 12/16/2017  . Bipolar I disorder (HCC) 10/10/2015  . BMI 70 and over, adult (HCC) 12/03/2015  . Borderline personality disorder (HCC) 03/14/2015  . Depression   . Fatty liver 10/10/2015  . GAD (generalized anxiety disorder) 07/19/2012  . Gastrointestinal disease 05/16/2013  . GERD (gastroesophageal reflux disease)   . H/O ASTHMA 10/08/2014  . HTN (hypertension) 07/13/12  . Hypertension 05/16/2013  . IBS (irritable bowel syndrome)   . Increased frequency of urination 04/12/2014  . INTERMITTENT URTICARIA 10/08/2014  . Intractable migraine without aura with status migrainosus 05/16/2013  . Iron deficiency 10/10/2015  . Iron deficiency  anemia 12/11/2015  . Lithium toxicity 04/05/2016  . 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 MDD (major depressive disorder) 11/18/2018  . MDD (major depressive disorder), recurrent episode, moderate (HCC) 03/01/2012  . MDD (major depressive disorder), recurrent episode, severe (HCC) 03/15/2019  . Metabolic syndrome 09/04/2012  . Morbid obesity (HCC) 04/03/2015  . Obesity   . Obesity affecting pregnancy in second trimester 04/11/2020  . Obstructive sleep apnea 07/19/2012  . ODD (oppositional defiant disorder) 07/19/2012  . OSA on CPAP   . Palpitations 07/21/2015  . Posttraumatic stress disorder 09/04/2014  . Primary hypertension 07/13/2012   Formatting of this note might be different from the original. Cardiac ECHO 07/23/15: Technically difficult study due to habitus Left ventricular cavity is normal in size. Normal diastolic filling pattern. Calculated  EF 71% Left atrial cavitiy mildly dilated Trace TR  . Psoriasis   . Schizoaffective disorder, depressive type (HCC) 03/23/2017  . Sinus tachycardia 08/07/2014  . Supervision of high risk pregnancy in second trimester 05/25/2020   Last Assessment & Plan:  Formatting of this note might be different from the original.  Discussed age related risk for fetal aneuploidy  Reviewed previous noninvasive prenatal screening attempts (Pan[facility] through [facility])- no results obtained due to low fetal fraction  Discussed various reasons for low fetal fraction including increased maternal BMI, early gestational age, maternal use of an  . Swelling of both lower extremities 12/14/2016  . Urge incontinence of urine 04/12/2014    Past Surgical History:  Procedure Laterality  Date  . ADENOIDECTOMY    . SINOSCOPY    . TONSILLECTOMY AND ADENOIDECTOMY  Age 79   Deviated septum corrected at same time    Current Medications: Current Meds  Medication Sig  . acetaminophen (TYLENOL) 325 MG tablet Take 1 tablet by mouth as needed  for pain.  Marland Kitchen albuterol (PROVENTIL) (2.5 MG/3ML) 0.083% nebulizer solution Inhale 3 mLs (2.5 mg total) into the lungs every 6 (six) hours as needed for shortness of breath.  . Aspirin (VAZALORE) 81 MG CAPS Take 81 mg by mouth daily.  Marland Kitchen buPROPion (WELLBUTRIN XL) 150 MG 24 hr tablet Take 150 mg by mouth daily.  Tery Sanfilippo Sodium (DSS) 100 MG CAPS Take 100 mg by mouth daily.  . ferrous sulfate 325 (65 FE) MG tablet Take 325 mg by mouth daily with breakfast.  . Fluocinolone Acetonide Body 0.01 % OIL Apply 1 application topically daily.  . hydrALAZINE (APRESOLINE) 10 MG tablet Take 1 tablet (10 mg total) by mouth 4 (four) times daily.  Marland Kitchen levothyroxine (SYNTHROID) 50 MCG tablet Take 1 tablet (50 mcg total) by mouth daily at 6 (six) AM.  . loratadine (CLARITIN) 10 MG tablet Take 1 tablet (10 mg total) by mouth daily.  . Magnesium 400 MG CAPS Take 1 tablet by mouth at bedtime.  . montelukast (SINGULAIR) 10 MG tablet Take 1 tablet (10 mg total) by mouth at bedtime.  . ondansetron (ZOFRAN) 4 MG tablet Take 4 mg by mouth every 8 (eight) hours as needed for nausea/vomiting.  . pantoprazole (PROTONIX) 40 MG tablet Take 40 mg by mouth daily.  . Prenatal Vit-Fe Fumarate-FA (PNV PRENATAL PLUS MULTIVITAMIN) 27-1 MG TABS Take 1 tablet by mouth daily.  . promethazine (PHENERGAN) 12.5 MG tablet Take 25 mg by mouth every 6 (six) hours as needed for nausea.  . sertraline (ZOLOFT) 100 MG tablet Take 2 tablets (200 mg total) by mouth daily.  . [DISCONTINUED] albuterol (VENTOLIN HFA) 108 (90 Base) MCG/ACT inhaler Inhale 2 puffs into the lungs every 4 (four) hours as needed for wheezing or shortness of breath.     Allergies:   Other, Ultram [tramadol], Etanercept, Molds & smuts, Tramadol hcl, Ziprasidone, Ziprasidone hcl, and Adhesive [tape]   Social History   Socioeconomic History  . Marital status: Significant Other    Spouse name: Not on file  . Number of children: Not on file  . Years of education: Not on file   . Highest education level: Not on file  Occupational History  . 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 Smoking status: Never Smoker  . Smokeless tobacco: Never Used  Substance and Sexual Activity  . Alcohol use: Yes    Comment: socially  . Drug use: No  . Sexual activity: Not Currently  Other Topics Concern  . Not on file  Social History Narrative  . Not on file   Social Determinants of Health   Financial Resource Strain: Not on file  Food Insecurity: Not on file  Transportation Needs: Not on file  Physical Activity: Not on file  Stress: Not on file  Social Connections: Not on file     Family History: The patient's family history includes Allergic rhinitis in her mother; Bipolar disorder in her father; Depression in her maternal grandmother and mother; Diabetes in her maternal grandmother; Drug abuse in her father; Hypertension in her father and maternal grandmother. ROS:   Please see the history  of present illness.    All 14 point review of systems negative except as described per history of present illness  EKGs/Labs/Other Studies Reviewed:      Recent Labs: 11/01/2019: BUN 13; Creatinine, Ser 0.63; NT-Pro BNP 5; Potassium 4.0; Sodium 140  Recent Lipid Panel    Component Value Date/Time   CHOL 155 04/17/2017 0644   TRIG 135 04/17/2017 0644   HDL 46 04/17/2017 0644   CHOLHDL 3.4 04/17/2017 0644   VLDL 27 04/17/2017 0644   LDLCALC 82 04/17/2017 0644    Physical Exam:    VS:  BP 120/68 (BP Location: Right Arm, Patient Position: Sitting)   Pulse 90   Ht 5\' 3"  (1.6 m)   Wt (!) 387 lb (175.5 kg)   SpO2 97%   BMI 68.55 kg/m     Wt Readings from Last 3 Encounters:  06/02/20 (!) 387 lb (175.5 kg)  02/13/20 (!) 387 lb (175.5 kg)  12/21/19 (!) 384 lb (174.2 kg)     GEN:  Well nourished, well developed in no acute distress HEENT: Normal NECK: No JVD; No carotid bruits LYMPHATICS: No  lymphadenopathy CARDIAC: RRR, no murmurs, no rubs, no gallops RESPIRATORY:  Clear to auscultation without rales, wheezing or rhonchi  ABDOMEN: Soft, non-tender, non-distended MUSCULOSKELETAL:  No edema; No deformity  SKIN: Warm and dry LOWER EXTREMITIES: no swelling NEUROLOGIC:  Alert and oriented x 3 PSYCHIATRIC:  Normal affect   ASSESSMENT:    1. Primary hypertension   2. Palpitations   3. Bipolar 1 disorder (HCC)   4. Swelling of both lower extremities    PLAN:    In order of problems listed above:  1. Essential hypertension, blood pressure well controlled continue present management she uses hydralazine only 10 mg 4 times daily I told her if she is see blood pressure less than 120 she may not need to take hydralazine.  I also told her if she have episode of hypotension she need to skip her medications for high blood pressure as well as drink some extra fluids. 2. Palpitation denies having any of beta-blocker, her heart rate is acceptable for her clinical status. 3. Bipolar disorder to be followed by internal medicine team. 4. Swelling of lower extremities chronic problem. 5. Pregnancy history is being followed by a group from [facility]  She seems to be tolerating pregnancy quite well.  There are plans to initiate aspirin which is appropriate for preeclampsia prevention, will follow her up in 3 months just before her delivery. I did review note from [facility] for this visit  Medication Adjustments/Labs and Tests Ordered: Current medicines are reviewed at length with the patient today.  Concerns regarding medicines are outlined above.  No orders of the defined types were placed in this encounter.  Medication changes: No orders of the defined types were placed in this encounter.   Signed, [facility], MD, [facility] 06/02/2020 9:39 AM    [facility]

## 2020-06-03 ENCOUNTER — Telehealth: Admit: 2020-06-03 | Discharge: 2020-06-04 | Payer: MEDICAID | Attending: Registered" | Primary: Registered"

## 2020-06-06 ENCOUNTER — Other Ambulatory Visit: Payer: Self-pay | Admitting: Cardiology

## 2020-06-09 ENCOUNTER — Telehealth
Admit: 2020-06-09 | Discharge: 2020-06-10 | Payer: MEDICAID | Attending: Advanced Practice Midwife | Primary: Advanced Practice Midwife

## 2020-06-09 DIAGNOSIS — R519 Headache, unspecified: Secondary | ICD-10-CM | POA: Insufficient documentation

## 2020-06-09 MED ORDER — DOXYLAMINE 10 MG-PYRIDOXINE (VIT B6) 10 MG TABLET,DELAYED RELEASE
ORAL_TABLET | Freq: Every evening | ORAL | 0 refills | 15.00000 days | Status: CP
Start: 2020-06-09 — End: ?

## 2020-06-13 DIAGNOSIS — G5603 Carpal tunnel syndrome, bilateral upper limbs: Secondary | ICD-10-CM | POA: Insufficient documentation

## 2020-06-18 ENCOUNTER — Ambulatory Visit
Admit: 2020-06-18 | Discharge: 2020-07-17 | Disposition: A | Discharge status: Home / Self Care | Payer: MEDICAID | Admitting: Maternal & Fetal Medicine

## 2020-06-18 ENCOUNTER — Encounter
Admit: 2020-06-18 | Discharge: 2020-07-17 | Disposition: A | Discharge status: Home / Self Care | Payer: MEDICAID | Attending: Student in an Organized Health Care Education/Training Program | Admitting: Maternal & Fetal Medicine

## 2020-07-17 DIAGNOSIS — G932 Benign intracranial hypertension: Secondary | ICD-10-CM | POA: Insufficient documentation

## 2020-07-17 MED ORDER — BUSPIRONE 10 MG TABLET
ORAL_TABLET | Freq: Three times a day (TID) | ORAL | 0 refills | 30 days | Status: CP
Start: 2020-07-17 — End: 2020-08-16
  Filled 2020-07-17: qty 90, 30d supply, fill #0

## 2020-07-17 MED ORDER — LABETALOL 200 MG TABLET
ORAL_TABLET | Freq: Three times a day (TID) | ORAL | 0 refills | 30 days | Status: CP
Start: 2020-07-17 — End: 2020-08-16
  Filled 2020-07-17: qty 180, 30d supply, fill #0

## 2020-07-17 MED ORDER — METOCLOPRAMIDE 10 MG TABLET
ORAL_TABLET | Freq: Four times a day (QID) | ORAL | 0 refills | 5 days | Status: CP | PRN
Start: 2020-07-17 — End: 2020-08-16
  Filled 2020-07-17: qty 20, 5d supply, fill #0

## 2020-07-17 MED ORDER — QUETIAPINE 25 MG TABLET
ORAL_TABLET | Freq: Three times a day (TID) | ORAL | 0 refills | 20 days | Status: CP | PRN
Start: 2020-07-17 — End: 2020-08-16
  Filled 2020-07-17: qty 30, 20d supply, fill #0

## 2020-07-17 MED ORDER — HYDRALAZINE 10 MG TABLET
ORAL_TABLET | Freq: Four times a day (QID) | ORAL | 0 refills | 30 days | Status: CP
Start: 2020-07-17 — End: 2020-08-16
  Filled 2020-07-17: qty 300, 30d supply, fill #0

## 2020-07-17 MED ORDER — ACETAZOLAMIDE 250 MG TABLET
ORAL_TABLET | Freq: Two times a day (BID) | ORAL | 0 refills | 30 days | Status: CP
Start: 2020-07-17 — End: 2020-08-16
  Filled 2020-07-17: qty 197, 12d supply, fill #0

## 2020-07-18 MED ORDER — MAGNESIUM OXIDE 400 MG (241.3 MG MAGNESIUM) TABLET
ORAL_TABLET | Freq: Every day | ORAL | 0 refills | 120 days | Status: CP
Start: 2020-07-18 — End: 2020-11-14
  Filled 2020-07-17: qty 120, 120d supply, fill #0

## 2020-07-18 MED ORDER — RIBOFLAVIN (VITAMIN B2) 100 MG TABLET
ORAL_TABLET | Freq: Every day | ORAL | 0 refills | 60.00000 days | Status: CP
Start: 2020-07-18 — End: 2020-09-16

## 2020-07-18 MED ORDER — BUPROPION HCL XL 300 MG 24 HR TABLET, EXTENDED RELEASE
ORAL_TABLET | Freq: Every day | ORAL | 0 refills | 30 days | Status: CP
Start: 2020-07-18 — End: 2020-08-17
  Filled 2020-07-17: qty 30, 30d supply, fill #0

## 2020-07-20 DIAGNOSIS — R7989 Other specified abnormal findings of blood chemistry: Secondary | ICD-10-CM | POA: Insufficient documentation

## 2020-07-21 ENCOUNTER — Telehealth
Admit: 2020-07-21 | Discharge: 2020-07-22 | Payer: MEDICAID | Attending: Advanced Practice Midwife | Primary: Advanced Practice Midwife

## 2020-07-21 DIAGNOSIS — R7989 Other specified abnormal findings of blood chemistry: Principal | ICD-10-CM

## 2020-07-21 DIAGNOSIS — O26892 Other specified pregnancy related conditions, second trimester: Principal | ICD-10-CM

## 2020-07-21 DIAGNOSIS — J45909 Unspecified asthma, uncomplicated: Principal | ICD-10-CM

## 2020-07-21 DIAGNOSIS — I10 Essential (primary) hypertension: Principal | ICD-10-CM

## 2020-07-21 DIAGNOSIS — K589 Irritable bowel syndrome without diarrhea: Principal | ICD-10-CM

## 2020-07-21 DIAGNOSIS — G43011 Migraine without aura, intractable, with status migrainosus: Principal | ICD-10-CM

## 2020-07-21 DIAGNOSIS — F819 Developmental disorder of scholastic skills, unspecified: Principal | ICD-10-CM

## 2020-07-21 DIAGNOSIS — K219 Gastro-esophageal reflux disease without esophagitis: Principal | ICD-10-CM

## 2020-07-21 DIAGNOSIS — K5900 Constipation, unspecified: Principal | ICD-10-CM

## 2020-07-21 DIAGNOSIS — G932 Benign intracranial hypertension: Principal | ICD-10-CM

## 2020-07-21 DIAGNOSIS — L409 Psoriasis, unspecified: Principal | ICD-10-CM

## 2020-07-21 DIAGNOSIS — Z6791 Unspecified blood type, Rh negative: Principal | ICD-10-CM

## 2020-07-21 DIAGNOSIS — G4733 Obstructive sleep apnea (adult) (pediatric): Principal | ICD-10-CM

## 2020-07-21 DIAGNOSIS — R000 Tachycardia, unspecified: Principal | ICD-10-CM

## 2020-07-21 DIAGNOSIS — F319 Bipolar disorder, unspecified: Principal | ICD-10-CM

## 2020-07-21 DIAGNOSIS — O0992 Supervision of high risk pregnancy, unspecified, second trimester: Principal | ICD-10-CM

## 2020-07-21 DIAGNOSIS — E039 Hypothyroidism, unspecified: Principal | ICD-10-CM

## 2020-07-21 DIAGNOSIS — Z1379 Encounter for other screening for genetic and chromosomal anomalies: Principal | ICD-10-CM

## 2020-07-21 DIAGNOSIS — J4550 Severe persistent asthma, uncomplicated: Principal | ICD-10-CM

## 2020-07-28 MED ORDER — BLOOD GLUCOSE TEST STRIPS
ORAL_STRIP | Freq: Once | 0 refills | 0 days | Status: CP
Start: 2020-07-28 — End: 2020-07-28

## 2020-07-28 MED ORDER — LANCETS 30 GAUGE
3 refills | 0 days | Status: CP
Start: 2020-07-28 — End: ?

## 2020-07-28 MED ORDER — BLOOD-GLUCOSE METER KIT WRAPPER
Freq: Once | 0 refills | 0 days | Status: CP
Start: 2020-07-28 — End: 2020-07-28

## 2020-07-29 ENCOUNTER — Ambulatory Visit: Admit: 2020-07-29 | Discharge: 2020-07-30 | Payer: MEDICAID

## 2020-07-29 DIAGNOSIS — H4710 Unspecified papilledema: Principal | ICD-10-CM

## 2020-07-30 ENCOUNTER — Ambulatory Visit
Admit: 2020-07-30 | Discharge: 2020-08-14 | Disposition: A | Discharge status: Home / Self Care | Payer: MEDICAID | Admitting: Obstetrics & Gynecology

## 2020-07-30 ENCOUNTER — Encounter
Admit: 2020-07-30 | Discharge: 2020-08-14 | Disposition: A | Discharge status: Home / Self Care | Payer: MEDICAID | Attending: Certified Registered" | Admitting: Obstetrics & Gynecology

## 2020-07-30 ENCOUNTER — Encounter
Admit: 2020-07-30 | Discharge: 2020-08-14 | Disposition: A | Discharge status: Home / Self Care | Payer: MEDICAID | Admitting: Obstetrics & Gynecology

## 2020-07-30 DIAGNOSIS — H538 Other visual disturbances: Principal | ICD-10-CM

## 2020-08-13 DIAGNOSIS — O34219 Maternal care for unspecified type scar from previous cesarean delivery: Secondary | ICD-10-CM | POA: Insufficient documentation

## 2020-08-14 MED ORDER — AMLODIPINE 5 MG TABLET
ORAL_TABLET | Freq: Every day | ORAL | 3 refills | 90 days | Status: CP
Start: 2020-08-14 — End: 2021-08-14
  Filled 2020-08-14: qty 90, 90d supply, fill #0

## 2020-08-14 MED ORDER — IBUPROFEN 600 MG TABLET
ORAL_TABLET | Freq: Four times a day (QID) | ORAL | 0 refills | 30 days | Status: CP
Start: 2020-08-14 — End: ?
  Filled 2020-08-14: qty 120, 30d supply, fill #0

## 2020-08-14 MED ORDER — RIBOFLAVIN (VITAMIN B2) 100 MG TABLET
ORAL_TABLET | Freq: Every day | ORAL | 0 refills | 60 days | Status: CP
Start: 2020-08-14 — End: 2020-10-13

## 2020-08-14 MED ORDER — OXYCODONE 5 MG TABLET
ORAL_TABLET | ORAL | 0 refills | 3 days | Status: CP | PRN
Start: 2020-08-14 — End: 2020-08-19
  Filled 2020-08-14: qty 15, 2d supply, fill #0

## 2020-08-14 MED ORDER — HYDRALAZINE 25 MG TABLET
ORAL_TABLET | Freq: Four times a day (QID) | ORAL | 0 refills | 30 days | Status: CP
Start: 2020-08-14 — End: 2020-09-13
  Filled 2020-08-14: qty 120, 30d supply, fill #0

## 2020-08-14 MED ORDER — QUETIAPINE 25 MG TABLET
ORAL_TABLET | Freq: Four times a day (QID) | ORAL | 0 refills | 15 days | Status: CP | PRN
Start: 2020-08-14 — End: 2020-09-13
  Filled 2020-08-14: qty 30, 15d supply, fill #0

## 2020-08-14 MED ORDER — SERTRALINE 100 MG TABLET
ORAL_TABLET | Freq: Every day | ORAL | 0 refills | 30 days | Status: CP
Start: 2020-08-14 — End: 2020-09-13
  Filled 2020-08-14: qty 75, 30d supply, fill #0

## 2020-08-14 MED ORDER — MONTELUKAST 10 MG TABLET
ORAL_TABLET | Freq: Every evening | ORAL | 0 refills | 30 days | Status: CP
Start: 2020-08-14 — End: ?

## 2020-08-14 MED ORDER — ACETAZOLAMIDE 250 MG TABLET
ORAL_TABLET | Freq: Two times a day (BID) | ORAL | 0 refills | 30 days | Status: CP
Start: 2020-08-14 — End: 2020-09-13
  Filled 2020-08-14: qty 198, 12d supply, fill #0

## 2020-08-14 MED ORDER — TOPIRAMATE 25 MG TABLET
ORAL_TABLET | Freq: Every day | ORAL | 0 refills | 30 days | Status: CP
Start: 2020-08-14 — End: 2020-09-13
  Filled 2020-08-14: qty 30, 30d supply, fill #0

## 2020-08-14 MED ORDER — MAGNESIUM OXIDE 400 MG (241.3 MG MAGNESIUM) TABLET
ORAL_TABLET | Freq: Every day | ORAL | 5 refills | 60.00000 days | Status: CP
Start: 2020-08-14 — End: 2021-08-14
  Filled 2020-08-14: qty 120, 60d supply, fill #0

## 2020-08-14 MED ORDER — ACETAMINOPHEN 325 MG TABLET
ORAL_TABLET | Freq: Four times a day (QID) | ORAL | 0 refills | 15 days | Status: CP
Start: 2020-08-14 — End: ?
  Filled 2020-08-14: qty 120, 15d supply, fill #0

## 2020-08-14 MED ORDER — FERROUS SULFATE 325 MG (65 MG IRON) TABLET
ORAL_TABLET | ORAL | 0 refills | 360 days | Status: CP
Start: 2020-08-14 — End: 2021-08-14
  Filled 2020-08-14: qty 180, 360d supply, fill #0

## 2020-08-14 MED ORDER — SENNOSIDES 8.6 MG TABLET
ORAL_TABLET | Freq: Every evening | ORAL | 0 refills | 30.00000 days | Status: CP
Start: 2020-08-14 — End: 2020-09-13
  Filled 2020-08-14: qty 30, 30d supply, fill #0

## 2020-08-14 MED ORDER — POTASSIUM CHLORIDE ER 20 MEQ TABLET,EXTENDED RELEASE(PART/CRYST)
ORAL_TABLET | Freq: Every day | ORAL | 11 refills | 30 days | Status: CP
Start: 2020-08-14 — End: 2021-08-14
  Filled 2020-08-14: qty 60, 30d supply, fill #0

## 2020-08-14 MED ORDER — ALBUTEROL SULFATE HFA 90 MCG/ACTUATION AEROSOL INHALER
Freq: Four times a day (QID) | RESPIRATORY_TRACT | 0 refills | 0 days | Status: CP | PRN
Start: 2020-08-14 — End: ?
  Filled 2020-08-14: qty 8.5, 25d supply, fill #0

## 2020-08-14 MED ORDER — NORETHINDRONE (CONTRACEPTIVE) 0.35 MG TABLET
ORAL_TABLET | Freq: Every day | ORAL | 11 refills | 84 days | Status: CP
Start: 2020-08-14 — End: 2021-08-14
  Filled 2020-08-14: qty 84, 84d supply, fill #0

## 2020-08-14 MED ORDER — ERYTHROMYCIN 5 MG/GRAM (0.5 %) EYE OINTMENT
Freq: Four times a day (QID) | OPHTHALMIC | 0 refills | 0 days | Status: CP
Start: 2020-08-14 — End: ?
  Filled 2020-08-14: qty 3.5, 2d supply, fill #0

## 2020-08-15 MED ORDER — LEVOTHYROXINE 50 MCG TABLET
ORAL_TABLET | Freq: Every day | ORAL | 0 refills | 30 days | Status: CP
Start: 2020-08-15 — End: 2020-09-14
  Filled 2020-08-14: qty 30, 30d supply, fill #0

## 2020-08-18 ENCOUNTER — Ambulatory Visit: Admit: 2020-08-18 | Discharge: 2020-08-19 | Payer: MEDICAID

## 2020-08-18 DIAGNOSIS — F315 Bipolar disorder, current episode depressed, severe, with psychotic features: Principal | ICD-10-CM

## 2020-08-18 DIAGNOSIS — O10919 Unspecified pre-existing hypertension complicating pregnancy, unspecified trimester: Principal | ICD-10-CM

## 2020-08-18 DIAGNOSIS — G932 Benign intracranial hypertension: Principal | ICD-10-CM

## 2020-08-18 DIAGNOSIS — Z4802 Encounter for removal of sutures: Principal | ICD-10-CM

## 2020-08-19 ENCOUNTER — Ambulatory Visit
Admit: 2020-08-19 | Discharge: 2020-08-20 | Payer: MEDICAID | Attending: Student in an Organized Health Care Education/Training Program | Primary: Student in an Organized Health Care Education/Training Program

## 2020-08-19 DIAGNOSIS — H4710 Unspecified papilledema: Principal | ICD-10-CM

## 2020-08-20 ENCOUNTER — Telehealth: Admit: 2020-08-20 | Discharge: 2020-08-21 | Payer: MEDICAID

## 2020-08-20 DIAGNOSIS — O99345 Other mental disorders complicating the puerperium: Principal | ICD-10-CM

## 2020-08-20 DIAGNOSIS — F339 Major depressive disorder, recurrent, unspecified: Principal | ICD-10-CM

## 2020-08-21 MED ORDER — QUETIAPINE ER 50 MG TABLET,EXTENDED RELEASE 24 HR
ORAL_TABLET | ORAL | 0 refills | 30 days | Status: CP
Start: 2020-08-21 — End: 2020-09-20
  Filled 2020-08-21: qty 30, 17d supply, fill #0

## 2020-08-25 MED FILL — ACETAZOLAMIDE 250 MG TABLET: ORAL | 12 days supply | Qty: 198 | Fill #1

## 2020-08-26 ENCOUNTER — Ambulatory Visit: Admit: 2020-08-26 | Discharge: 2020-08-26 | Payer: MEDICAID

## 2020-08-26 ENCOUNTER — Telehealth
Admit: 2020-08-26 | Discharge: 2020-08-26 | Payer: MEDICAID | Attending: Student in an Organized Health Care Education/Training Program | Primary: Student in an Organized Health Care Education/Training Program

## 2020-08-26 MED ORDER — RIBOFLAVIN (VITAMIN B2) 100 MG TABLET
ORAL_TABLET | Freq: Every day | ORAL | 3 refills | 45.00000 days | Status: CP
Start: 2020-08-26 — End: ?

## 2020-08-26 MED ORDER — QUETIAPINE 25 MG TABLET
ORAL_TABLET | Freq: Four times a day (QID) | ORAL | 0 refills | 15.00000 days | Status: CP | PRN
Start: 2020-08-26 — End: 2020-08-26
  Filled 2020-09-05: qty 30, 15d supply, fill #0

## 2020-08-27 ENCOUNTER — Emergency Department: Admit: 2020-08-27 | Discharge: 2020-08-29 | Disposition: A | Discharge status: Home / Self Care | Payer: MEDICAID

## 2020-08-27 ENCOUNTER — Ambulatory Visit: Admit: 2020-08-27 | Discharge: 2020-08-29 | Disposition: A | Discharge status: Home / Self Care | Payer: MEDICAID

## 2020-08-27 ENCOUNTER — Ambulatory Visit: Admit: 2020-08-27 | Discharge: 2020-08-28 | Payer: MEDICAID

## 2020-08-27 DIAGNOSIS — R55 Syncope and collapse: Secondary | ICD-10-CM | POA: Insufficient documentation

## 2020-08-27 DIAGNOSIS — L7682 Other postprocedural complications of skin and subcutaneous tissue: Principal | ICD-10-CM

## 2020-08-29 MED ORDER — PROMETHAZINE 12.5 MG TABLET
ORAL_TABLET | Freq: Four times a day (QID) | ORAL | 0 refills | 8 days | PRN
Start: 2020-08-29 — End: ?

## 2020-08-30 MED ORDER — BUPROPION HCL XL 300 MG 24 HR TABLET, EXTENDED RELEASE
ORAL_TABLET | Freq: Every day | ORAL | 0 refills | 30 days
Start: 2020-08-30 — End: 2020-09-29

## 2020-08-30 MED ORDER — BUSPIRONE 10 MG TABLET
ORAL_TABLET | Freq: Three times a day (TID) | ORAL | 0 refills | 30 days
Start: 2020-08-30 — End: 2020-09-29

## 2020-09-01 ENCOUNTER — Telehealth: Admit: 2020-09-01 | Discharge: 2020-09-02 | Payer: MEDICAID | Attending: Clinical | Primary: Clinical

## 2020-09-01 MED ORDER — BUPROPION HCL XL 300 MG 24 HR TABLET, EXTENDED RELEASE
ORAL_TABLET | Freq: Every day | ORAL | 0 refills | 30.00000 days
Start: 2020-09-01 — End: 2020-10-01

## 2020-09-01 MED ORDER — SERTRALINE 100 MG TABLET
ORAL_TABLET | Freq: Every day | ORAL | 3 refills | 30 days
Start: 2020-09-01 — End: 2020-12-30

## 2020-09-01 MED ORDER — BUSPIRONE 10 MG TABLET
ORAL_TABLET | Freq: Three times a day (TID) | ORAL | 0 refills | 30.00000 days
Start: 2020-09-01 — End: 2020-10-01

## 2020-09-02 MED ORDER — PANTOPRAZOLE 40 MG TABLET,DELAYED RELEASE
ORAL_TABLET | Freq: Every day | ORAL | 0 refills | 90 days | Status: CP
Start: 2020-09-02 — End: ?

## 2020-09-02 MED ORDER — BUPROPION HCL XL 300 MG 24 HR TABLET, EXTENDED RELEASE
ORAL_TABLET | Freq: Every morning | ORAL | 1 refills | 30.00000 days | Status: CP
Start: 2020-09-02 — End: ?

## 2020-09-02 MED ORDER — IBUPROFEN 600 MG TABLET
ORAL_TABLET | Freq: Three times a day (TID) | ORAL | 0 refills | 30 days | Status: CP | PRN
Start: 2020-09-02 — End: ?

## 2020-09-02 MED ORDER — ACETAMINOPHEN 325 MG TABLET
ORAL_TABLET | ORAL | 2 refills | 9.00000 days | Status: CP | PRN
Start: 2020-09-02 — End: 2020-09-02

## 2020-09-02 MED ORDER — BUSPIRONE 10 MG TABLET
ORAL_TABLET | Freq: Three times a day (TID) | ORAL | 1 refills | 30 days | Status: CP
Start: 2020-09-02 — End: 2020-10-02

## 2020-09-03 MED ORDER — BUSPIRONE 10 MG TABLET
ORAL_TABLET | Freq: Three times a day (TID) | ORAL | 1 refills | 30 days | Status: CP
Start: 2020-09-03 — End: 2020-10-03
  Filled 2020-09-05: qty 90, 30d supply, fill #0

## 2020-09-03 MED ORDER — IBUPROFEN 600 MG TABLET
ORAL_TABLET | Freq: Three times a day (TID) | ORAL | 0 refills | 30 days | Status: CP | PRN
Start: 2020-09-03 — End: ?

## 2020-09-03 MED ORDER — ACETAMINOPHEN 325 MG TABLET
ORAL_TABLET | Freq: Three times a day (TID) | ORAL | 0 refills | 15 days | Status: CP
Start: 2020-09-03 — End: ?
  Filled 2020-09-05: qty 90, 15d supply, fill #0

## 2020-09-03 MED ORDER — SERTRALINE 100 MG TABLET
ORAL_TABLET | Freq: Every day | ORAL | 0 refills | 30 days | Status: CP
Start: 2020-09-03 — End: 2020-10-03

## 2020-09-03 MED ORDER — BUPROPION HCL XL 300 MG 24 HR TABLET, EXTENDED RELEASE
ORAL_TABLET | Freq: Every morning | ORAL | 1 refills | 30 days | Status: CP
Start: 2020-09-03 — End: ?
  Filled 2020-09-05: qty 30, 30d supply, fill #0

## 2020-09-04 ENCOUNTER — Other Ambulatory Visit
Admit: 2020-09-04 | Discharge: 2020-09-05 | Payer: MEDICAID | Attending: Advanced Practice Midwife | Primary: Advanced Practice Midwife

## 2020-09-04 DIAGNOSIS — F315 Bipolar disorder, current episode depressed, severe, with psychotic features: Principal | ICD-10-CM

## 2020-09-04 MED ORDER — BUDESONIDE 90 MCG/ACTUATION BREATH ACTIVATED POWDER INHALER
Freq: Two times a day (BID) | RESPIRATORY_TRACT | 11 refills | 30 days | Status: CP
Start: 2020-09-04 — End: 2020-09-04

## 2020-09-04 MED ORDER — FLUTICASONE PROPIONATE 110 MCG/ACTUATION HFA AEROSOL INHALER
Freq: Two times a day (BID) | RESPIRATORY_TRACT | 1 refills | 0 days | Status: CP
Start: 2020-09-04 — End: 2021-09-04
  Filled 2020-09-05: qty 12, 60d supply, fill #0

## 2020-09-05 ENCOUNTER — Ambulatory Visit: Payer: Medicaid Other | Admitting: Cardiology

## 2020-09-05 MED FILL — PROMETHAZINE 12.5 MG TABLET: ORAL | 8 days supply | Qty: 30 | Fill #0

## 2020-09-07 ENCOUNTER — Ambulatory Visit: Admit: 2020-09-07 | Discharge: 2020-09-07 | Disposition: A | Discharge status: Home / Self Care | Payer: MEDICAID

## 2020-09-07 MED ORDER — ACETAZOLAMIDE 250 MG TABLET
ORAL_TABLET | Freq: Two times a day (BID) | ORAL | 0 refills | 30.00000 days | Status: CP
Start: 2020-09-07 — End: 2020-10-07

## 2020-09-07 MED ORDER — QUETIAPINE ER 50 MG TABLET,EXTENDED RELEASE 24 HR
ORAL_TABLET | Freq: Every evening | ORAL | 0 refills | 30.00000 days | Status: CP
Start: 2020-09-07 — End: 2020-10-07

## 2020-09-07 MED ORDER — HYDRALAZINE 25 MG TABLET
ORAL_TABLET | Freq: Four times a day (QID) | ORAL | 0 refills | 30.00000 days | Status: CP
Start: 2020-09-07 — End: 2020-10-07

## 2020-09-07 MED ORDER — LEVOTHYROXINE 50 MCG TABLET
ORAL_TABLET | Freq: Every day | ORAL | 0 refills | 30.00000 days | Status: CP
Start: 2020-09-07 — End: 2020-10-07

## 2020-09-07 MED ORDER — TOPIRAMATE 25 MG TABLET
ORAL_TABLET | Freq: Every day | ORAL | 0 refills | 30.00000 days | Status: CP
Start: 2020-09-07 — End: 2020-09-07

## 2020-09-07 MED ORDER — SERTRALINE 100 MG TABLET
ORAL_TABLET | Freq: Every day | ORAL | 0 refills | 30.00000 days | Status: CP
Start: 2020-09-07 — End: 2020-10-07

## 2020-09-07 MED ORDER — CLINDAMYCIN HCL 300 MG CAPSULE
ORAL_CAPSULE | Freq: Four times a day (QID) | ORAL | 0 refills | 7.00000 days | Status: CP
Start: 2020-09-07 — End: 2020-09-14

## 2020-09-12 ENCOUNTER — Other Ambulatory Visit: Admit: 2020-09-12 | Discharge: 2020-09-13 | Payer: MEDICAID

## 2020-09-12 DIAGNOSIS — E039 Hypothyroidism, unspecified: Principal | ICD-10-CM

## 2020-09-12 MED ORDER — BUPROPION HCL XL 450 MG 24 HR TABLET, EXTENDED RELEASE
ORAL_TABLET | Freq: Every morning | ORAL | 3 refills | 30 days | Status: CP
Start: 2020-09-12 — End: ?

## 2020-09-12 MED FILL — POTASSIUM CHLORIDE ER 20 MEQ TABLET,EXTENDED RELEASE(PART/CRYST): ORAL | 30 days supply | Qty: 60 | Fill #1

## 2020-09-17 ENCOUNTER — Ambulatory Visit: Admit: 2020-09-17 | Discharge: 2020-09-18 | Payer: MEDICAID

## 2020-09-17 DIAGNOSIS — F321 Major depressive disorder, single episode, moderate: Principal | ICD-10-CM

## 2020-09-18 ENCOUNTER — Ambulatory Visit: Admit: 2020-09-18 | Discharge: 2020-09-19 | Payer: MEDICAID

## 2020-09-18 DIAGNOSIS — G932 Benign intracranial hypertension: Principal | ICD-10-CM

## 2020-09-18 DIAGNOSIS — Z6841 Body Mass Index (BMI) 40.0 and over, adult: Principal | ICD-10-CM

## 2020-09-18 DIAGNOSIS — H4710 Unspecified papilledema: Principal | ICD-10-CM

## 2020-09-18 MED ORDER — TOPIRAMATE 25 MG TABLET
ORAL_TABLET | Freq: Two times a day (BID) | ORAL | 2 refills | 30 days | Status: CP
Start: 2020-09-18 — End: 2021-09-18

## 2020-09-19 ENCOUNTER — Ambulatory Visit
Admit: 2020-09-19 | Payer: MEDICAID | Attending: Rehabilitative and Restorative Service Providers" | Primary: Rehabilitative and Restorative Service Providers"

## 2020-09-19 ENCOUNTER — Ambulatory Visit
Admit: 2020-09-19 | Discharge: 2020-10-18 | Payer: MEDICAID | Attending: Rehabilitative and Restorative Service Providers" | Primary: Rehabilitative and Restorative Service Providers"

## 2020-09-20 ENCOUNTER — Emergency Department: Admit: 2020-09-20 | Discharge: 2020-09-21 | Disposition: A | Discharge status: Home / Self Care | Payer: MEDICAID

## 2020-09-20 ENCOUNTER — Ambulatory Visit: Admit: 2020-09-20 | Discharge: 2020-09-21 | Disposition: A | Discharge status: Home / Self Care | Payer: MEDICAID

## 2020-09-20 DIAGNOSIS — R519 Headache, unspecified headache type: Principal | ICD-10-CM

## 2020-09-20 DIAGNOSIS — G932 Benign intracranial hypertension: Principal | ICD-10-CM

## 2020-09-22 DIAGNOSIS — G932 Benign intracranial hypertension: Principal | ICD-10-CM

## 2020-09-23 ENCOUNTER — Ambulatory Visit
Admit: 2020-09-23 | Discharge: 2020-09-24 | Payer: MEDICAID | Attending: Student in an Organized Health Care Education/Training Program | Primary: Student in an Organized Health Care Education/Training Program

## 2020-09-23 DIAGNOSIS — H4710 Unspecified papilledema: Principal | ICD-10-CM

## 2020-09-28 ENCOUNTER — Ambulatory Visit: Admit: 2020-09-28 | Discharge: 2020-09-29 | Payer: MEDICAID | Attending: Adult Health | Primary: Adult Health

## 2020-09-29 ENCOUNTER — Ambulatory Visit: Admit: 2020-09-29 | Discharge: 2020-09-30 | Payer: MEDICAID

## 2020-10-01 ENCOUNTER — Other Ambulatory Visit: Admit: 2020-10-01 | Discharge: 2020-10-02 | Payer: MEDICAID | Attending: Registered Nurse | Primary: Registered Nurse

## 2020-10-01 DIAGNOSIS — R0602 Shortness of breath: Principal | ICD-10-CM

## 2020-10-01 DIAGNOSIS — N921 Excessive and frequent menstruation with irregular cycle: Principal | ICD-10-CM

## 2020-10-01 DIAGNOSIS — R0600 Dyspnea, unspecified: Principal | ICD-10-CM

## 2020-10-01 DIAGNOSIS — Z124 Encounter for screening for malignant neoplasm of cervix: Principal | ICD-10-CM

## 2020-10-01 DIAGNOSIS — Z1389 Encounter for screening for other disorder: Principal | ICD-10-CM

## 2020-10-01 DIAGNOSIS — E039 Hypothyroidism, unspecified: Principal | ICD-10-CM

## 2020-10-02 ENCOUNTER — Ambulatory Visit: Admit: 2020-10-02 | Discharge: 2020-10-03 | Payer: MEDICAID

## 2020-10-06 ENCOUNTER — Telehealth: Payer: Self-pay | Admitting: Cardiology

## 2020-10-06 NOTE — Telephone Encounter (Signed)
Patient states she was in the hospital and got out last Thursday from [facility]. She states they changed her medications and would like a call back to discuss her hospital visit.

## 2020-10-06 NOTE — Telephone Encounter (Signed)
Spoke to the patient just now and she let me know that she just got out of the hospital on Thursday after having her baby on 08/11/2020.  She states that they started her on Amlodipine 5 mg daily and increased her hydralazine to 25 mg QID. She needs refills and is wanting to know if Dr. Bing Matter can send them in for her.

## 2020-10-16 ENCOUNTER — Telehealth: Payer: Self-pay | Admitting: Cardiology

## 2020-10-16 ENCOUNTER — Telehealth: Admit: 2020-10-16 | Discharge: 2020-10-17 | Payer: MEDICAID

## 2020-10-16 NOTE — Telephone Encounter (Signed)
Patient states she recently had lab work with her OB-GYN and it showed her heart is failing. She is requesting a call back from Dr. Vanetta Shawl nurse to discuss further.

## 2020-10-16 NOTE — Telephone Encounter (Signed)
Pt states that her ProBNP was 250 2 weeks ago. How do you advise?

## 2020-10-17 ENCOUNTER — Other Ambulatory Visit: Payer: Self-pay | Admitting: Home Health

## 2020-10-17 ENCOUNTER — Telehealth: Payer: Self-pay | Admitting: Home Health

## 2020-10-17 ENCOUNTER — Other Ambulatory Visit: Payer: Self-pay

## 2020-10-17 MED ORDER — AMLODIPINE BESYLATE 5 MG PO TABS: 5.0000 mg | ORAL_TABLET | Freq: Every day | ORAL | 0 refills | Status: DC

## 2020-10-17 MED ORDER — HYDRALAZINE HCL 25 MG PO TABS: 25.0000 mg | ORAL_TABLET | Freq: Four times a day (QID) | ORAL | 0 refills | Status: DC

## 2020-10-17 NOTE — Telephone Encounter (Signed)
Patient called after hour line, called back at [phone], spoke to patient directly.  Patient states she follows Dr. Bing Matter, just delivered her baby at [facility] and returned home today. She states she is not planning on breast feeding. She states her OB diagnosed her with CHF recently. She wants to know what medication she should be on for CHF. She also reports that she had ran out of her antihypertensives amlodipine and hydralazine, needs an urgent refill.   Care everywhere records reviewed echo from [facility] completed on 10/02/2020 was done for new onset of DOE postpartum, LV is normal in size with normal wall thickness, LVEF >55%, mild MR, RV systolic function normal.   Advised patient to contact Dr. Bing Matter on Monday 10/20/20 during business hours for further discussion of newly diagnosed CHF, medication reconciliation.  She likely will need an inpatient appointment for her questions.  30-day supply for amlodipine and hydralazine has been prescribed to her pharmacy, as she is no longer pregnant at current time and has no refill for her hypertension.  Patient has agreed with above plan.

## 2020-10-17 NOTE — Telephone Encounter (Signed)
Pt wants to make sure she is on the medication she needs as some were changed while she was in the hospital. Medication list has been updated.

## 2020-10-17 NOTE — Addendum Note (Signed)
Addended by: Eleonore Chiquito on: 10/17/2020 02:58 PM   Modules accepted: Orders

## 2020-10-17 NOTE — Telephone Encounter (Signed)
Left vm fot pt to callback.

## 2020-10-20 MED ORDER — AMLODIPINE BESYLATE 5 MG PO TABS: 5.0000 mg | ORAL_TABLET | Freq: Every day | ORAL | 2 refills | Status: DC

## 2020-10-20 NOTE — Telephone Encounter (Signed)
Recommendations reviewed with pt as per Dr. Krasowski's note.  Pt verbalized understanding and had no additional questions.  

## 2020-10-20 NOTE — Addendum Note (Signed)
Addended by: Eleonore Chiquito on: 10/20/2020 10:42 AM   Modules accepted: Orders

## 2020-10-20 NOTE — Telephone Encounter (Signed)
Left VM for pt to call back.

## 2020-10-23 ENCOUNTER — Ambulatory Visit: Admit: 2020-10-23 | Discharge: 2020-10-23 | Payer: MEDICAID

## 2020-10-23 DIAGNOSIS — G932 Benign intracranial hypertension: Principal | ICD-10-CM

## 2020-10-24 DIAGNOSIS — Z419 Encounter for procedure for purposes other than remedying health state, unspecified: Principal | ICD-10-CM

## 2020-10-24 DIAGNOSIS — G932 Benign intracranial hypertension: Principal | ICD-10-CM

## 2020-10-24 MED ORDER — ASPIRIN 325 MG TABLET,DELAYED RELEASE
ORAL_TABLET | Freq: Every day | ORAL | 11 refills | 30 days | Status: CP
Start: 2020-10-24 — End: 2021-10-24

## 2020-10-24 MED ORDER — CLOPIDOGREL 75 MG TABLET
ORAL_TABLET | Freq: Every day | ORAL | 11 refills | 30 days | Status: CP
Start: 2020-10-24 — End: 2021-10-24

## 2020-11-04 MED ORDER — ACETAZOLAMIDE 250 MG TABLET
ORAL_TABLET | Freq: Two times a day (BID) | ORAL | 3 refills | 30 days | Status: CP
Start: 2020-11-04 — End: 2021-03-04

## 2020-11-04 MED ORDER — TOPIRAMATE 25 MG TABLET
ORAL_TABLET | Freq: Two times a day (BID) | ORAL | 5 refills | 30.00000 days | Status: CP
Start: 2020-11-04 — End: 2021-05-03

## 2020-11-20 ENCOUNTER — Ambulatory Visit: Admit: 2020-11-20 | Discharge: 2020-11-20 | Payer: MEDICAID

## 2020-11-20 DIAGNOSIS — Z419 Encounter for procedure for purposes other than remedying health state, unspecified: Principal | ICD-10-CM

## 2020-11-21 ENCOUNTER — Other Ambulatory Visit: Payer: Self-pay | Admitting: Home Health

## 2020-11-27 ENCOUNTER — Encounter: Admit: 2020-11-27 | Payer: MEDICAID

## 2020-11-27 ENCOUNTER — Encounter
Admit: 2020-11-27 | Discharge: 2020-11-28 | Payer: MEDICAID | Attending: Student in an Organized Health Care Education/Training Program | Primary: Student in an Organized Health Care Education/Training Program

## 2020-11-27 ENCOUNTER — Ambulatory Visit: Admit: 2020-11-27 | Discharge: 2020-11-28 | Payer: MEDICAID

## 2020-11-28 DIAGNOSIS — G932 Benign intracranial hypertension: Principal | ICD-10-CM

## 2020-11-28 MED ORDER — OXYCODONE-ACETAMINOPHEN 5 MG-325 MG TABLET
ORAL_TABLET | ORAL | 0 refills | 5.00000 days | Status: CP | PRN
Start: 2020-11-28 — End: 2020-12-03
  Filled 2020-11-28: qty 30, 5d supply, fill #0

## 2020-11-28 MED ORDER — HYDRALAZINE 25 MG TABLET
ORAL_TABLET | Freq: Four times a day (QID) | ORAL | 0 refills | 30.00000 days | Status: CP
Start: 2020-11-28 — End: 2020-12-28

## 2020-11-28 MED ORDER — LEVOTHYROXINE 50 MCG TABLET
ORAL_TABLET | Freq: Every day | ORAL | 0 refills | 30.00000 days | Status: CP
Start: 2020-11-28 — End: 2020-12-28
  Filled 2020-11-28: qty 30, 30d supply, fill #0

## 2020-12-04 ENCOUNTER — Ambulatory Visit: Admit: 2020-12-04 | Discharge: 2020-12-05 | Payer: MEDICAID

## 2020-12-04 DIAGNOSIS — H4710 Unspecified papilledema: Principal | ICD-10-CM

## 2020-12-22 ENCOUNTER — Other Ambulatory Visit: Payer: Self-pay | Admitting: Cardiology

## 2020-12-25 ENCOUNTER — Encounter: Payer: Self-pay | Admitting: Cardiology

## 2020-12-25 ENCOUNTER — Ambulatory Visit (INDEPENDENT_AMBULATORY_CARE_PROVIDER_SITE_OTHER): Payer: Medicaid Other | Admitting: Cardiology

## 2020-12-25 ENCOUNTER — Other Ambulatory Visit: Payer: Self-pay

## 2020-12-25 VITALS — BP 132/76 | HR 78 | Ht 63.0 in | Wt 372.4 lb

## 2020-12-25 DIAGNOSIS — G4733 Obstructive sleep apnea (adult) (pediatric): Secondary | ICD-10-CM

## 2020-12-25 DIAGNOSIS — Z9989 Dependence on other enabling machines and devices: Secondary | ICD-10-CM | POA: Diagnosis not present

## 2020-12-25 DIAGNOSIS — I10 Essential (primary) hypertension: Secondary | ICD-10-CM | POA: Diagnosis not present

## 2020-12-25 NOTE — Patient Instructions (Signed)
Medication Instructions:  Your physician recommends that you continue on your current medications as directed. Please refer to the Current Medication list given to you today.  *If you need a refill on your cardiac medications before your next appointment, please call your pharmacy*   Lab Work: Your physician recommends that you return for lab work in: Today for Basic Metabolic Panel  If you have labs (blood work) drawn today and your tests are completely normal, you will receive your results only by: MyChart Message (if you have MyChart) OR A paper copy in the mail If you have any lab test that is abnormal or we need to change your treatment, we will call you to review the results.   Testing/Procedures: Your physician has requested that you have an echocardiogram. Echocardiography is a painless test that uses sound waves to create images of your heart. It provides your doctor with information about the size and shape of your heart and how well your heart's chambers and valves are working. This procedure takes approximately one hour. There are no restrictions for this procedure.    Follow-Up: At [facility], you and your health needs are our priority.  As part of our continuing mission to provide you with exceptional heart care, we have created designated Provider Care Teams.  These Care Teams include your primary Cardiologist (physician) and Advanced Practice Providers (APPs -  Physician Assistants and Nurse Practitioners) who all work together to provide you with the care you need, when you need it.  We recommend signing up for the patient portal called "MyChart".  Sign up information is provided on this After Visit Summary.  MyChart is used to connect with patients for Virtual Visits (Telemedicine).  Patients are able to view lab/test results, encounter notes, upcoming appointments, etc.  Non-urgent messages can be sent to your provider as well.   To learn more about what you can do with  MyChart, go to [email or web address].    Your next appointment:   6 month(s)  The format for your next appointment:   In Person  Provider:   [name], MD    Other Instructions

## 2020-12-25 NOTE — Progress Notes (Signed)
Cardiology Office Note:    Date:  12/25/2020   ID:  Misty [name], DOB Jun 16, 1995, MRN [id number]  PCP:  [name], FNP  Cardiologist:  [name], MD    Referring MD: [name], FNP   Chief Complaint  Patient presents with   Follow-up  I am not doing well  History of Present Illness:    Misty [name] is a 25 y.o. female with past medical history significant for essential hypertension, bipolar disorder, morbid obesity, recently she was diagnosed with intracranial hypertension she requests shunt which was done in October.  She comes today to my office for follow-up.  She said she is feeling fairly well most of the time.  She have a 56 months old son that she is taking care of.  Described to have fatigue tiredness shortness of breath.  She was told to have severe congestive heart failure.  I explained to her what congestive heart failure is and I told her also that her heart is designed to function for up to 200 pounds body and not for 372 pounds body.  She may have signs and symptoms of congestive heart failure even though structurally her heart is perfectly normal just improve it we will get another echocardiogram.  She denies have any palpitation no tightness squeezing pressure burning chest.  Past Medical History:  Diagnosis Date   Allergic rhinitis    Anxiety    Bipolar 1 disorder (HCC) 04/15/2017   Bipolar 1 disorder, depressed, severe (HCC) 12/16/2017   Bipolar I disorder (HCC) 10/10/2015   BMI 70 and over, adult (HCC) 12/03/2015   Borderline personality disorder (HCC) 03/14/2015   Depression    Fatty liver 10/10/2015   GAD (generalized anxiety disorder) 07/19/2012   Gastrointestinal disease 05/16/2013   GERD (gastroesophageal reflux disease)    H/O ASTHMA 10/08/2014   HTN (hypertension) 07/13/12   Hypertension 05/16/2013   IBS (irritable bowel syndrome)    Increased frequency of urination 04/12/2014   INTERMITTENT URTICARIA 10/08/2014   Intractable migraine without aura with  status migrainosus 05/16/2013   Iron deficiency 10/10/2015   Iron deficiency anemia 12/11/2015   Lithium toxicity 04/05/2016   Major depressive disorder, recurrent, 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   MDD (major depressive disorder) 11/18/2018   MDD (major depressive disorder), recurrent episode, moderate (HCC) 03/01/2012   MDD (major depressive disorder), recurrent episode, severe (HCC) 03/15/2019   Metabolic syndrome 09/04/2012   Morbid obesity (HCC) 04/03/2015   Obesity    Obesity affecting pregnancy in second trimester 04/11/2020   Obstructive sleep apnea 07/19/2012   ODD (oppositional defiant disorder) 07/19/2012   OSA on CPAP    Palpitations 07/21/2015   Posttraumatic stress disorder 09/04/2014   Primary hypertension 07/13/2012   Formatting of this note might be different from the original. Cardiac ECHO 07/23/15: Technically difficult study due to habitus Left ventricular cavity is normal in size. Normal diastolic filling pattern. Calculated  EF 71% Left atrial cavitiy mildly dilated Trace TR   Psoriasis    Schizoaffective disorder, depressive type (HCC) 03/23/2017   Sinus tachycardia 08/07/2014   Supervision of high risk pregnancy in second trimester 05/25/2020   Last Assessment & Plan:  Formatting of this note might be different from the original.  Discussed age related risk for fetal aneuploidy  Reviewed previous noninvasive prenatal screening attempts ([facility] through [place] [facility])- no results obtained due to low fetal fraction  Discussed various reasons for low fetal fraction including increased maternal BMI, early gestational age, maternal  use of an   Swelling of both lower extremities 12/14/2016   Urge incontinence of urine 04/12/2014    Past Surgical History:  Procedure Laterality Date   ADENOIDECTOMY     SINOSCOPY     TONSILLECTOMY AND ADENOIDECTOMY  Age 73   Deviated septum corrected at same time    Current Medications: Current Meds  Medication Sig   acetaZOLAMIDE (DIAMOX)  250 MG tablet Take 8 tablets by mouth 2 (two) times daily.   amLODipine (NORVASC) 5 MG tablet Take 1 tablet (5 mg total) by mouth daily.   aspirin EC 325 MG tablet Take 325 mg by mouth daily.   buPROPion (WELLBUTRIN XL) 150 MG 24 hr tablet Take 450 mg by mouth daily.   calcium-vitamin D 250-100 MG-UNIT tablet Take 1 tablet by mouth 2 (two) times daily.   clopidogrel (PLAVIX) 75 MG tablet Take 75 mg by mouth daily.   Docusate Sodium (DSS) 100 MG CAPS Take 100 mg by mouth daily.   ferrous sulfate 325 (65 FE) MG tablet Take 325 mg by mouth daily with breakfast.   hydrALAZINE (APRESOLINE) 25 MG tablet Take 1 tablet (25 mg total) by mouth 4 (four) times daily.   levothyroxine (SYNTHROID) 50 MCG tablet Take 1 tablet (50 mcg total) by mouth daily at 6 (six) AM.   Magnesium 400 MG CAPS Take 2 tablets by mouth at bedtime.   montelukast (SINGULAIR) 10 MG tablet Take 1 tablet (10 mg total) by mouth at bedtime.   pantoprazole (PROTONIX) 40 MG tablet Take 40 mg by mouth daily.   potassium chloride SA (KLOR-CON) 20 MEQ tablet Take 20 mEq by mouth 2 (two) times daily.   QUEtiapine (SEROQUEL) 50 MG tablet Take 100 mg by mouth at bedtime.   sertraline (ZOLOFT) 100 MG tablet Take 250 mg by mouth daily.   topiramate (TOPAMAX) 25 MG capsule Take 25 mg by mouth 2 (two) times daily.     Allergies:   Other, Ultram [tramadol], Etanercept, Molds & smuts, Tramadol hcl, Ziprasidone, Ziprasidone hcl, and Adhesive [tape]   Social History   Socioeconomic History   Marital status: Significant Other    Spouse name: Not on file   Number of children: Not on file   Years of education: Not on file   Highest education level: Not on file  Occupational History   Occupation: STUDENT    Employer: [employer]    Comment: 11th grade [employer]  Tobacco Use   Smoking status: Never   Smokeless tobacco: Never  Substance and Sexual Activity   Alcohol use: Yes    Comment: socially   Drug use: No   Sexual activity: Not  Currently  Other Topics Concern   Not on file  Social History Narrative   Not on file   Social Determinants of Health   Financial Resource Strain: Not on file  Food Insecurity: Not on file  Transportation Needs: Not on file  Physical Activity: Not on file  Stress: Not on file  Social Connections: Not on file     Family History: The patient's family history includes Allergic rhinitis in her mother; Bipolar disorder in her father; Depression in her maternal grandmother and mother; Diabetes in her maternal grandmother; Drug abuse in her father; Hypertension in her father and maternal grandmother. ROS:   Please see the history of present illness.    All 14 point review of systems negative except as described per history of present illness  EKGs/Labs/Other Studies Reviewed:  Recent Labs: No results found for requested labs within last 8760 hours.  Recent Lipid Panel    Component Value Date/Time   CHOL 155 04/17/2017 0644   TRIG 135 04/17/2017 0644   HDL 46 04/17/2017 0644   CHOLHDL 3.4 04/17/2017 0644   VLDL 27 04/17/2017 0644   LDLCALC 82 04/17/2017 0644    Physical Exam:    VS:  BP 132/76 (BP Location: Left Arm, Patient Position: Sitting)   Pulse 78   Ht 5\' 3"  (1.6 m)   Wt (!) 372 lb 6.4 oz (168.9 kg)   SpO2 98%   BMI 65.97 kg/m     Wt Readings from Last 3 Encounters:  12/25/20 (!) 372 lb 6.4 oz (168.9 kg)  06/02/20 (!) 387 lb (175.5 kg)  02/13/20 (!) 387 lb (175.5 kg)     GEN:  Well nourished, well developed in no acute distress HEENT: Normal NECK: No JVD; No carotid bruits LYMPHATICS: No lymphadenopathy CARDIAC: RRR, no murmurs, no rubs, no gallops RESPIRATORY:  Clear to auscultation without rales, wheezing or rhonchi  ABDOMEN: Soft, non-tender, non-distended MUSCULOSKELETAL:  No edema; No deformity  SKIN: Warm and dry LOWER EXTREMITIES: no swelling NEUROLOGIC:  Alert and oriented x 3 PSYCHIATRIC:  Normal affect   ASSESSMENT:    1. Primary  hypertension   2. OSA on CPAP   3. Morbid obesity (HCC)    PLAN:    In order of problems listed above:  Essential hypertension blood pressure appears to be well controlled continue present management. Obstructive sleep apnea to be followed by internal medicine team. Morbid obesity: Obviously huge problem her BMI 64.  There is no document about this mind that weight loss will be significantly beneficial I think she is should start having conversation about potentially having gastric bypass surgery.  This will be beneficial to her Intracranial hypertension.  Shunt placed recently.  She was told that she may require another surgery in the future.   Medication Adjustments/Labs and Tests Ordered: Current medicines are reviewed at length with the patient today.  Concerns regarding medicines are outlined above.  No orders of the defined types were placed in this encounter.  Medication changes: No orders of the defined types were placed in this encounter.   Signed, 04/12/20, MD, [name] 12/25/2020 11:37 AM    [facility]

## 2020-12-26 ENCOUNTER — Telehealth: Payer: Self-pay | Admitting: Cardiology

## 2020-12-26 LAB — BASIC METABOLIC PANEL
BUN/Creatinine Ratio: 25 — ABNORMAL HIGH (ref 9–23)
BUN: 13 mg/dL (ref 6–20)
CO2: 23 mmol/L (ref 20–29)
Calcium: 8.9 mg/dL (ref 8.7–10.2)
Chloride: 106 mmol/L (ref 96–106)
Creatinine, Ser: 0.51 mg/dL — ABNORMAL LOW (ref 0.57–1.00)
Glucose: 98 mg/dL (ref 70–99)
Potassium: 4.2 mmol/L (ref 3.5–5.2)
Sodium: 143 mmol/L (ref 134–144)
eGFR: 133 mL/min/{1.73_m2} (ref >59)

## 2020-12-26 NOTE — Telephone Encounter (Signed)
Called patient. Informed her lab results have not been reviewed by Dr. Bing Matter yet. Once they are we will call her. She understood.

## 2020-12-26 NOTE — Telephone Encounter (Signed)
Pt is reaching out wanting to review results please advise

## 2020-12-26 NOTE — Telephone Encounter (Signed)
Patient returning call.

## 2020-12-26 NOTE — Telephone Encounter (Signed)
Left message for patient to return call.

## 2020-12-29 ENCOUNTER — Telehealth: Payer: Self-pay

## 2020-12-29 NOTE — Telephone Encounter (Signed)
-----   Message from Georgeanna Lea, MD sent at 12/29/2020 10:01 AM EST ----- Labs are looking good, continue present management

## 2020-12-29 NOTE — Telephone Encounter (Signed)
Patient aware of results.

## 2021-01-04 MED ORDER — MONTELUKAST 10 MG TABLET
ORAL_TABLET | Freq: Every evening | ORAL | 0 refills | 90 days
Start: 2021-01-04 — End: ?

## 2021-01-06 ENCOUNTER — Ambulatory Visit: Admit: 2021-01-06 | Discharge: 2021-01-07 | Payer: MEDICAID

## 2021-01-15 ENCOUNTER — Other Ambulatory Visit: Payer: Medicaid Other

## 2021-01-29 ENCOUNTER — Other Ambulatory Visit: Payer: Self-pay

## 2021-01-29 ENCOUNTER — Ambulatory Visit (INDEPENDENT_AMBULATORY_CARE_PROVIDER_SITE_OTHER): Payer: Medicaid Other

## 2021-01-29 DIAGNOSIS — I10 Essential (primary) hypertension: Secondary | ICD-10-CM | POA: Diagnosis not present

## 2021-01-29 LAB — ECHOCARDIOGRAM COMPLETE
Area-P 1/2: 3.06 cm2
S' Lateral: 3.7 cm

## 2021-01-30 ENCOUNTER — Telehealth: Payer: Self-pay

## 2021-01-30 NOTE — Telephone Encounter (Signed)
-----   Message from Georgeanna Lea, MD sent at 01/30/2021  8:47 AM EST ----- Echocardiogram show strong heart, overall looks good

## 2021-01-30 NOTE — Telephone Encounter (Signed)
Spoke with patient regarding results and recommendation.  Patient verbalizes understanding and is agreeable to plan of care. Advised patient to call back with any issues or concerns.  

## 2021-02-05 ENCOUNTER — Emergency Department (HOSPITAL_COMMUNITY): Payer: Medicaid Other

## 2021-02-05 ENCOUNTER — Other Ambulatory Visit: Payer: Self-pay

## 2021-02-05 ENCOUNTER — Encounter (HOSPITAL_COMMUNITY): Payer: Self-pay | Admitting: Emergency Medicine

## 2021-02-05 ENCOUNTER — Emergency Department (HOSPITAL_COMMUNITY)
Admission: EM | Admit: 2021-02-05 | Discharge: 2021-02-05 | Disposition: A | Discharge status: Home / Self Care | Payer: Medicaid Other | Attending: Emergency Medicine | Admitting: Emergency Medicine

## 2021-02-05 DIAGNOSIS — H539 Unspecified visual disturbance: Secondary | ICD-10-CM

## 2021-02-05 DIAGNOSIS — G932 Benign intracranial hypertension: Secondary | ICD-10-CM | POA: Insufficient documentation

## 2021-02-05 DIAGNOSIS — R519 Headache, unspecified: Secondary | ICD-10-CM | POA: Insufficient documentation

## 2021-02-05 DIAGNOSIS — Z79899 Other long term (current) drug therapy: Secondary | ICD-10-CM | POA: Diagnosis not present

## 2021-02-05 DIAGNOSIS — J45909 Unspecified asthma, uncomplicated: Secondary | ICD-10-CM | POA: Insufficient documentation

## 2021-02-05 DIAGNOSIS — N9489 Other specified conditions associated with female genital organs and menstrual cycle: Secondary | ICD-10-CM | POA: Diagnosis not present

## 2021-02-05 DIAGNOSIS — E039 Hypothyroidism, unspecified: Secondary | ICD-10-CM | POA: Insufficient documentation

## 2021-02-05 DIAGNOSIS — I10 Essential (primary) hypertension: Secondary | ICD-10-CM | POA: Diagnosis not present

## 2021-02-05 DIAGNOSIS — Z7902 Long term (current) use of antithrombotics/antiplatelets: Secondary | ICD-10-CM | POA: Diagnosis not present

## 2021-02-05 DIAGNOSIS — R531 Weakness: Secondary | ICD-10-CM | POA: Insufficient documentation

## 2021-02-05 DIAGNOSIS — Z20822 Contact with and (suspected) exposure to covid-19: Secondary | ICD-10-CM | POA: Diagnosis not present

## 2021-02-05 DIAGNOSIS — Z7982 Long term (current) use of aspirin: Secondary | ICD-10-CM | POA: Insufficient documentation

## 2021-02-05 DIAGNOSIS — H538 Other visual disturbances: Secondary | ICD-10-CM | POA: Insufficient documentation

## 2021-02-05 LAB — COMPREHENSIVE METABOLIC PANEL
ALT: 24 U/L (ref 0–44)
AST: 18 U/L (ref 15–41)
Albumin: 3.9 g/dL (ref 3.5–5.0)
Alkaline Phosphatase: 121 U/L (ref 38–126)
Anion gap: 10 (ref 5–15)
BUN: 16 mg/dL (ref 6–20)
CO2: 18 mmol/L — ABNORMAL LOW (ref 22–32)
Calcium: 9.7 mg/dL (ref 8.9–10.3)
Chloride: 112 mmol/L — ABNORMAL HIGH (ref 98–111)
Creatinine, Ser: 0.75 mg/dL (ref 0.44–1.00)
GFR, Estimated: >60 mL/min (ref >60)
Glucose, Bld: 125 mg/dL — ABNORMAL HIGH (ref 70–99)
Potassium: 3.6 mmol/L (ref 3.5–5.1)
Sodium: 140 mmol/L (ref 135–145)
Total Bilirubin: 0.4 mg/dL (ref 0.3–1.2)
Total Protein: 7.2 g/dL (ref 6.5–8.1)

## 2021-02-05 LAB — CBC
HCT: 43.3 % (ref 36.0–46.0)
Hemoglobin: 13.7 g/dL (ref 12.0–15.0)
MCH: 27.2 pg (ref 26.0–34.0)
MCHC: 31.6 g/dL (ref 30.0–36.0)
MCV: 85.9 fL (ref 80.0–100.0)
Platelets: 284 10*3/uL (ref 150–400)
RBC: 5.04 MIL/uL (ref 3.87–5.11)
RDW: 13.8 % (ref 11.5–15.5)
WBC: 14.4 10*3/uL — ABNORMAL HIGH (ref 4.0–10.5)
nRBC: 0 % (ref 0.0–0.2)

## 2021-02-05 LAB — RAPID URINE DRUG SCREEN, HOSP PERFORMED
Amphetamines: NOT DETECTED
Barbiturates: NOT DETECTED
Benzodiazepines: NOT DETECTED
Cocaine: NOT DETECTED
Opiates: NOT DETECTED
Tetrahydrocannabinol: NOT DETECTED

## 2021-02-05 LAB — DIFFERENTIAL
Abs Immature Granulocytes: 0.07 10*3/uL (ref 0.00–0.07)
Basophils Absolute: 0.1 10*3/uL (ref 0.0–0.1)
Basophils Relative: 0 %
Eosinophils Absolute: 0.1 10*3/uL (ref 0.0–0.5)
Eosinophils Relative: 1 %
Immature Granulocytes: 1 %
Lymphocytes Relative: 13 %
Lymphs Abs: 1.8 10*3/uL (ref 0.7–4.0)
Monocytes Absolute: 1.2 10*3/uL — ABNORMAL HIGH (ref 0.1–1.0)
Monocytes Relative: 9 %
Neutro Abs: 11.1 10*3/uL — ABNORMAL HIGH (ref 1.7–7.7)
Neutrophils Relative %: 76 %

## 2021-02-05 LAB — I-STAT CHEM 8, ED
BUN: 18 mg/dL (ref 6–20)
Calcium, Ion: 1.19 mmol/L (ref 1.15–1.40)
Chloride: 112 mmol/L — ABNORMAL HIGH (ref 98–111)
Creatinine, Ser: 0.5 mg/dL (ref 0.44–1.00)
Glucose, Bld: 125 mg/dL — ABNORMAL HIGH (ref 70–99)
HCT: 43 % (ref 36.0–46.0)
Hemoglobin: 14.6 g/dL (ref 12.0–15.0)
Potassium: 3.6 mmol/L (ref 3.5–5.1)
Sodium: 142 mmol/L (ref 135–145)
TCO2: 19 mmol/L — ABNORMAL LOW (ref 22–32)

## 2021-02-05 LAB — URINALYSIS, ROUTINE W REFLEX MICROSCOPIC
Bacteria, UA: NONE SEEN
Bilirubin Urine: NEGATIVE
Glucose, UA: NEGATIVE mg/dL
Ketones, ur: NEGATIVE mg/dL
Leukocytes,Ua: NEGATIVE
Nitrite: NEGATIVE
Protein, ur: NEGATIVE mg/dL
Specific Gravity, Urine: 1.006 (ref 1.005–1.030)
pH: 7 (ref 5.0–8.0)

## 2021-02-05 LAB — PROTIME-INR
INR: 1 (ref 0.8–1.2)
Prothrombin Time: 13.1 seconds (ref 11.4–15.2)

## 2021-02-05 LAB — I-STAT BETA HCG BLOOD, ED (MC, WL, AP ONLY): I-stat hCG, quantitative: <5 m[IU]/mL (ref <5)

## 2021-02-05 LAB — RESP PANEL BY RT-PCR (FLU A&B, COVID) ARPGX2
Influenza A by PCR: NEGATIVE
Influenza B by PCR: NEGATIVE
SARS Coronavirus 2 by RT PCR: NEGATIVE

## 2021-02-05 LAB — APTT: aPTT: 36 seconds (ref 24–36)

## 2021-02-05 LAB — ETHANOL: Alcohol, Ethyl (B): <10 mg/dL (ref <10)

## 2021-02-05 MED ORDER — SODIUM CHLORIDE 0.9 % IV BOLUS
500.0000 mL | Freq: Once | INTRAVENOUS | Status: AC
  Administered 2021-02-05: 15:00:00 500 mL via INTRAVENOUS

## 2021-02-05 MED ORDER — MAGNESIUM SULFATE 2 GM/50ML IV SOLN
2.0000 g | Freq: Once | INTRAVENOUS | Status: AC
  Administered 2021-02-05: 16:00:00 2 g via INTRAVENOUS
  Filled 2021-02-05: qty 50

## 2021-02-05 MED ORDER — METOCLOPRAMIDE HCL 5 MG/ML IJ SOLN
10.0000 mg | Freq: Once | INTRAMUSCULAR | Status: AC
  Administered 2021-02-05: 17:00:00 10 mg via INTRAVENOUS
  Filled 2021-02-05: qty 2

## 2021-02-05 MED ORDER — IOHEXOL 300 MG/ML  SOLN
75.0000 mL | Freq: Once | INTRAMUSCULAR | Status: AC | PRN
  Administered 2021-02-05: 14:00:00 75 mL via INTRAVENOUS

## 2021-02-05 MED ORDER — KETOROLAC TROMETHAMINE 15 MG/ML IJ SOLN
15.0000 mg | Freq: Once | INTRAMUSCULAR | Status: AC
  Administered 2021-02-05: 15:00:00 15 mg via INTRAVENOUS
  Filled 2021-02-05: qty 1

## 2021-02-05 MED ORDER — DIPHENHYDRAMINE HCL 50 MG/ML IJ SOLN
25.0000 mg | Freq: Once | INTRAMUSCULAR | Status: AC
  Administered 2021-02-05: 17:00:00 25 mg via INTRAVENOUS
  Filled 2021-02-05: qty 1

## 2021-02-05 NOTE — ED Provider Notes (Addendum)
[facility] EMERGENCY DEPARTMENT Provider Note   CSN: [id number] Arrival date & time: 02/05/21  [id number]     History Chief Complaint  Patient presents with   Headache / Hand Weakness    Misty [name] is a 25 y.o. female.  Patient is a 25 yo female with PMH of Idiopathic Intracranial HTN and Placement of Venous Zilver stent 8x60 mm for treatment of right transverse sinus stenosis presenting for headache. Sees Dr. James Ivanoff with neurosurgery at [facility]. Patient admits to headache that started yesterday, described as severe, non radiating, associated with blurred vision. States headache is like her typical headaches. Admits to left hand weakness that is also previously seen with headaches. Denies any fevers, chills, nausea, vomiting, or infectious signs/symptoms. Denies any recent falls or head trauma.   The history is provided by the patient. No language interpreter was used.      Past Medical History:  Diagnosis Date   Allergic rhinitis    Anxiety    Bipolar 1 disorder ([place]) 04/15/2017   Bipolar 1 disorder, depressed, severe ([place]) 12/16/2017   Bipolar I disorder ([place]) 10/10/2015   BMI 70 and over, adult ([place]) 12/03/2015   Borderline personality disorder ([place]) 03/14/2015   Depression    Fatty liver 10/10/2015   GAD (generalized anxiety disorder) 07/19/2012   Gastrointestinal disease 05/16/2013   GERD (gastroesophageal reflux disease)    H/O ASTHMA 10/08/2014   HTN (hypertension) 07/13/12   Hypertension 05/16/2013   IBS (irritable bowel syndrome)    Increased frequency of urination 04/12/2014   INTERMITTENT URTICARIA 10/08/2014   Intractable migraine without aura with status migrainosus 05/16/2013   Iron deficiency 10/10/2015   Iron deficiency anemia 12/11/2015   Lithium toxicity 04/05/2016   Major depressive disorder, recurrent, 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   MDD (major depressive disorder) 11/18/2018   MDD (major depressive disorder), recurrent episode,  moderate ([place]) 03/01/2012   MDD (major depressive disorder), recurrent episode, severe ([place]) [id number]   Metabolic syndrome [id number]   Morbid obesity ([place]) 04/03/2015   Obesity    Obesity affecting pregnancy in second trimester 04/11/2020   Obstructive sleep apnea 07/19/2012   ODD (oppositional defiant disorder) 07/19/2012   OSA on CPAP    Palpitations 07/21/2015   Posttraumatic stress disorder 09/04/2014   Primary hypertension 07/13/2012   Formatting of this note might be different from the original. Cardiac ECHO 07/23/15: Technically difficult study due to habitus Left ventricular cavity is normal in size. Normal diastolic filling pattern. Calculated  EF 71% Left atrial cavitiy mildly dilated Trace TR   Psoriasis    Schizoaffective disorder, depressive type ([place]) 03/23/2017   Sinus tachycardia 08/07/2014   Supervision of high risk pregnancy in second trimester 05/25/2020   Last Assessment & Plan:  Formatting of this note might be different from the original.  Discussed age related risk for fetal aneuploidy  Reviewed previous noninvasive prenatal screening attempts ([place] through [facility])- no results obtained due to low fetal fraction  Discussed various reasons for low fetal fraction including increased maternal BMI, early gestational age, maternal use of an   Swelling of both lower extremities 12/14/2016   Urge incontinence of urine 04/12/2014    Patient Active Problem List   Diagnosis Date Noted   Syncope 08/27/2020   Delivery by classical cesarean section 08/13/2020   Elevated LFTs 07/20/2020   Idiopathic intracranial hypertension 07/17/2020   Intractable headache 06/09/2020   Learning disabilities 05/31/2020   Hypothyroidism 05/31/2020   Severe persistent asthma without  complication 99991111   Supervision of high risk pregnancy in second trimester 05/25/2020   OSA on CPAP 05/09/2020   IBS (irritable bowel syndrome) 05/09/2020   Anxiety 05/09/2020   Psoriasis 04/11/2020   Obesity  affecting pregnancy in second trimester 04/11/2020   Depression    MDD (major depressive disorder), recurrent episode, severe ([place]) 03/15/2019   MDD (major depressive disorder) 11/18/2018   Bipolar 1 disorder, depressed, severe ([place]) 12/16/2017   Schizoaffective disorder, depressive type ([place]) 03/23/2017   Major depressive disorder, recurrent, 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/2017   Swelling of both lower extremities 12/14/2016   Lithium toxicity 04/05/2016   Iron deficiency anemia 12/11/2015   Fatty liver 10/10/2015   Iron deficiency 10/10/2015   Bipolar I disorder, current or most recent episode depressed, with psychotic features ([place]) 09/12/2015   Palpitations 07/21/2015   Morbid obesity ([place]) 04/03/2015   Class 3 obesity ([place]) 04/03/2015   Borderline personality disorder ([place]) 03/14/2015   GERD (gastroesophageal reflux disease) 10/08/2014   H/O ASTHMA 10/08/2014   INTERMITTENT URTICARIA 10/08/2014   Posttraumatic stress disorder 09/04/2014   Sinus tachycardia 08/07/2014   Increased frequency of urination 04/12/2014   Urge incontinence of urine 04/12/2014   Hypertension 05/16/2013   Constipation 05/16/2013   Intractable migraine without aura with status migrainosus 05/16/2013   Chronic hypertension affecting pregnancy 99991111   Metabolic syndrome [id number]   PTSD (post-traumatic stress disorder) 07/19/2012   Obstructive sleep apnea 07/19/2012   Primary hypertension 07/13/2012   MDD (major depressive disorder), recurrent episode, moderate ([place]) 03/01/2012    Past Surgical History:  Procedure Laterality Date   ADENOIDECTOMY     SINOSCOPY     TONSILLECTOMY AND ADENOIDECTOMY  Age 45   Deviated septum corrected at same time     OB History   No obstetric history on file.     Family History  Problem Relation Age of Onset   Depression Mother    Allergic rhinitis Mother    Depression Maternal Grandmother    Diabetes Maternal Grandmother    Hypertension  Maternal Grandmother    Bipolar disorder Father    Drug abuse Father    Hypertension Father     Social History   Tobacco Use   Smoking status: Never   Smokeless tobacco: Never  Substance Use Topics   Alcohol use: Yes    Comment: socially   Drug use: No    Home Medications Prior to Admission medications   Medication Sig Start Date End Date Taking? Authorizing Provider  acetaZOLAMIDE (DIAMOX) 250 MG tablet Take 8 tablets by mouth 2 (two) times daily. 09/07/20   [provider]  amLODipine (NORVASC) 5 MG tablet Take 1 tablet (5 mg total) by mouth daily. 10/20/20   Park Liter, MD  aspirin EC 325 MG tablet Take 325 mg by mouth daily. 12/02/20   [provider]  buPROPion (WELLBUTRIN XL) 150 MG 24 hr tablet Take 450 mg by mouth daily.    [provider]  calcium-vitamin D 250-100 MG-UNIT tablet Take 1 tablet by mouth 2 (two) times daily.    [provider]  clopidogrel (PLAVIX) 75 MG tablet Take 75 mg by mouth daily. 12/02/20   [provider]  Docusate Sodium (DSS) 100 MG CAPS Take 100 mg by mouth daily.    [provider]  ferrous sulfate 325 (65 FE) MG tablet Take 325 mg by mouth daily with breakfast.    [provider]  hydrALAZINE (APRESOLINE) 25 MG tablet Take  1 tablet (25 mg total) by mouth 4 (four) times daily. 12/22/20   Georgeanna Lea, MD  levothyroxine (SYNTHROID) 50 MCG tablet Take 1 tablet (50 mcg total) by mouth daily at 6 (six) AM. 03/24/19   Money, Gerlene Burdock, FNP  Magnesium 400 MG CAPS Take 2 tablets by mouth at bedtime.    [provider]  montelukast (SINGULAIR) 10 MG tablet Take 1 tablet (10 mg total) by mouth at bedtime. 03/23/19   Money, Gerlene Burdock, FNP  pantoprazole (PROTONIX) 40 MG tablet Take 40 mg by mouth daily.    [provider]  potassium chloride SA (KLOR-CON) 20 MEQ tablet Take 20 mEq by mouth 2 (two) times daily.    [provider]  QUEtiapine (SEROQUEL) 50 MG  tablet Take 100 mg by mouth at bedtime.    [provider]  sertraline (ZOLOFT) 100 MG tablet Take 250 mg by mouth daily.    [provider]  topiramate (TOPAMAX) 25 MG capsule Take 25 mg by mouth 2 (two) times daily.    [provider]    Allergies    Other, Ultram [tramadol], Etanercept, Molds & smuts, Tramadol hcl, Ziprasidone, Ziprasidone hcl, and Adhesive [tape]  Review of Systems   Review of Systems  Constitutional:  Negative for chills and fever.  HENT:  Negative for ear pain and sore throat.   Eyes:  Positive for visual disturbance. Negative for pain.  Respiratory:  Negative for cough and shortness of breath.   Cardiovascular:  Negative for chest pain and palpitations.  Gastrointestinal:  Negative for abdominal pain and vomiting.  Genitourinary:  Negative for dysuria and hematuria.  Musculoskeletal:  Negative for arthralgias and back pain.  Skin:  Negative for color change and rash.  Neurological:  Positive for weakness and headaches. Negative for seizures and syncope.  All other systems reviewed and are negative.  Physical Exam Updated Vital Signs BP 137/69    Pulse 69    Temp (!) 97.3 F (36.3 C) (Oral)    Resp (!) 23    Ht 5\' 3"  (1.6 m)    Wt (!) 180 kg    SpO2 100%    BMI 70.29 kg/m   Physical Exam Vitals and nursing note reviewed.  Constitutional:      General: She is not in acute distress.    Appearance: She is well-developed.  HENT:     Head: Normocephalic and atraumatic.  Eyes:     General: Lids are normal. Vision grossly intact.     Conjunctiva/sclera: Conjunctivae normal.     Pupils: Pupils are equal, round, and reactive to light.  Cardiovascular:     Rate and Rhythm: Normal rate and regular rhythm.     Heart sounds: No murmur heard. Pulmonary:     Effort: Pulmonary effort is normal. No respiratory distress.     Breath sounds: Normal breath sounds.  Abdominal:     Palpations: Abdomen is soft.     Tenderness: There is no  abdominal tenderness.  Musculoskeletal:        General: No swelling.     Cervical back: Neck supple.  Skin:    General: Skin is warm and dry.     Capillary Refill: Capillary refill takes less than 2 seconds.  Neurological:     Mental Status: She is alert and oriented to person, place, and time.     GCS: GCS eye subscore is 4. GCS verbal subscore is 5. GCS motor subscore is 6.  Cranial Nerves: Cranial nerves 2-12 are intact. No cranial nerve deficit.     Sensory: Sensation is intact.     Motor: Weakness present.     Coordination: Coordination is intact.     Gait: Gait is intact.  Psychiatric:        Mood and Affect: Mood normal.    ED Results / Procedures / Treatments   Labs (all labs ordered are listed, but only abnormal results are displayed) Labs Reviewed  CBC - Abnormal; Notable for the following components:      Result Value   WBC 14.4 (*)    All other components within normal limits  DIFFERENTIAL - Abnormal; Notable for the following components:   Neutro Abs 11.1 (*)    Monocytes Absolute 1.2 (*)    All other components within normal limits  COMPREHENSIVE METABOLIC PANEL - Abnormal; Notable for the following components:   Chloride 112 (*)    CO2 18 (*)    Glucose, Bld 125 (*)    All other components within normal limits  URINALYSIS, ROUTINE W REFLEX MICROSCOPIC - Abnormal; Notable for the following components:   APPearance CLOUDY (*)    Hgb urine dipstick SMALL (*)    All other components within normal limits  I-STAT CHEM 8, ED - Abnormal; Notable for the following components:   Chloride 112 (*)    Glucose, Bld 125 (*)    TCO2 19 (*)    All other components within normal limits  RESP PANEL BY RT-PCR (FLU A&B, COVID) ARPGX2  ETHANOL  PROTIME-INR  APTT  RAPID URINE DRUG SCREEN, HOSP PERFORMED  I-STAT BETA HCG BLOOD, ED ([facility], [facility], [facility] ONLY)    EKG None  Radiology CT HEAD WO CONTRAST  Result Date: 02/05/2021 CLINICAL DATA:  25 year old female with history  of new onset of headache. Left hand grip weakness. EXAM: CT HEAD WITHOUT CONTRAST TECHNIQUE: Contiguous axial images were obtained from the base of the skull through the vertex without intravenous contrast. COMPARISON:  Head CT 12/18/2020. FINDINGS: Brain: No evidence of acute infarction, hemorrhage, hydrocephalus, extra-axial collection or mass lesion/mass effect. Vascular: No hyperdense vessel or unexpected calcification. Skull: Normal. Negative for fracture or focal lesion. Sinuses/Orbits: No acute finding. Other: None. IMPRESSION: 1. No acute intracranial abnormalities. The appearance of the brain is normal. Electronically Signed   By: Vinnie Langton M.D.   On: 02/05/2021 04:57    Procedures .Critical Care Performed by: Lianne Cure, DO Authorized by: Lianne Cure, DO   Critical care provider statement:    Critical care time (minutes):  30   Critical care start time:  02/05/2021 4:12 AM   Critical care was time spent personally by me on the following activities:  Development of treatment plan with patient or surrogate, discussions with consultants, examination of patient, ordering and review of laboratory studies, ordering and review of radiographic studies, ordering and performing treatments and interventions, re-evaluation of patient's condition, review of old charts and evaluation of patient's response to treatment Comments:     Discussed care with three separate specialists/consultants due to signif PMH   Medications Ordered in ED Medications - No data to display  ED Course  I have reviewed the triage vital signs and the nursing notes.  Pertinent labs & imaging results that were available during my care of the patient were reviewed by me and considered in my medical decision making (see chart for details).    MDM Rules/Calculators/A&P  10:19 AM  25 yo female with PMH of Idiopathic Intracranial HTN and Placement of Venous Zilver stent 8x60 mm for  treatment of right transverse sinus stenosis presenting for headache and left hand weakness. Pt Aox3, afebrile, with stable vitals. No acute distress.   Left hand weakness when compared with left. CT head demonstrates no acute process. CT venogram ordered due to hx of stent Patient's neurosurgeon Dr. James Ivanoff from [facility]. Attempt to contact. Spoke with on call neurosurgeon for hospital. Stated he would follow up closely with pt outpatient but otherwise couldn't given any recommendations over the phone..  Neurosurgery consulted with [facility]. Will follow after CT venogram.  Toradol, IV fluids, mag given for headache management while awaiting venogram.   3:21 PM CT venogram demonstrates: Patent right transverse sinus stent. No evidence of dural venous sinus thrombosis. Call back to neurosurgery-recs pt is safe for DC home with close follow up with established neurosurgery.   Opthalmology on call contacted for patient's reported vision changes. Chart review demonstrates patient was seen at [facility] optho previously for similar symptoms. Dx of papilledema. Opthalmology has made appointment for patient first thing tomorrow morning in office. Pt agreeable for follow up.   Patient's headache improved after reglan/benadryl/magnesium/ivf/toradol. Patient in no distress and overall condition improved here in the ED. Detailed discussions were had with the patient regarding current findings, and need for close f/u with her neurosurgeon. The patient has been instructed to return immediately if the symptoms worsen in any way for re-evaluation. Patient verbalized understanding and is in agreement with current care plan. All questions answered prior to discharge.     Final Clinical Impression(s) / ED Diagnoses Final diagnoses:  IIH (idiopathic intracranial hypertension)  Vision changes  Weakness-left hand  Nonintractable headache, unspecified chronicity pattern, unspecified headache type    Rx / DC  Orders ED Discharge Orders     None        Lianne Cure, DO [id number]    Lianne Cure, DO [id number]

## 2021-02-05 NOTE — ED Notes (Signed)
Patient transported to CT 

## 2021-02-05 NOTE — ED Triage Notes (Signed)
Patient arrived with EMS from home , went to bed at 9 pm last night woke up at 2am this morning with headache and left hand grip weakness , alert and oriented x4 , speech clear , no arm drift / no facial asymmetry , received Zofran 4 mg ODT prior to arrival for nausea .

## 2021-02-05 NOTE — ED Provider Notes (Signed)
Emergency Medicine Provider Triage Evaluation Note  Misty [name] , a 25 y.o. female  was evaluated in triage.  Pt complains of headache and left hand weakness.  Has history of intracranial hypertension.  Takes Diamox.  States that she went to bed last night at 9 PM and felt well.  Woke up this morning around 2 AM with headache and felt like her left hand was weak.  Also reports some decreased peripheral vision out of left eye.  Review of Systems  Positive: Headache, weakness Negative: Fever, chills  Physical Exam  BP (!) 141/88 (BP Location: Left Arm)    Pulse 96    Temp (!) 97.3 F (36.3 C) (Oral)    Resp 14    Ht 5\' 3"  (1.6 m)    Wt (!) 180 kg    SpO2 97%    BMI 70.29 kg/m  Gen:   Awake, no distress   Resp:  Normal effort  MSK:   Slightly decreased grip strength in left hand, but normal strength of extension and flexion of left upper arm Other:    Medical Decision Making  Medically screening exam initiated at 4:24 AM.  Appropriate orders placed.  Misty [name] was informed that the remainder of the evaluation will be completed by another provider, this initial triage assessment does not replace that evaluation, and the importance of remaining in the ED until their evaluation is complete.  Headache  Case discussed with Dr. , who agrees with proceeding with work-up, but without activation of code stroke due to timing.  Last known well 9 PM last night.   Misty Bath, PA-C 02/05/21 02/07/21    0300, MD 02/05/21 [phone]

## 2021-02-05 NOTE — Discharge Instructions (Signed)
Today you had a CT scan of your brain demonstrating no acute changes.   You also had a CT venogram to evaluate the stent in your venous sinus. The scan showed that the stent was patent and that there were no new findings/concerns.   I spoke with your neurosurgeon physician on call for Dr. Valora Piccolo who said they could get you in quickly for close follow up this week for your worsening headaches and associated hand weakness.  I also spoke with our on call ophthalmologist who recommends follow up in office first thing tomorrow morning with them or your established ophthalmologist for your vision changes.

## 2021-02-17 ENCOUNTER — Ambulatory Visit: Admit: 2021-02-17 | Discharge: 2021-02-18 | Payer: MEDICAID

## 2021-02-17 DIAGNOSIS — G932 Benign intracranial hypertension: Principal | ICD-10-CM

## 2021-02-17 DIAGNOSIS — H4710 Unspecified papilledema: Principal | ICD-10-CM

## 2021-02-20 ENCOUNTER — Encounter: Admit: 2021-02-20 | Payer: MEDICAID

## 2021-02-20 ENCOUNTER — Ambulatory Visit: Admit: 2021-02-20 | Discharge: 2021-02-21 | Payer: MEDICAID

## 2021-02-21 MED ORDER — OXYCODONE 5 MG TABLET
ORAL_TABLET | ORAL | 0 refills | 7.00 days | Status: CP | PRN
Start: 2021-02-21 — End: 2021-02-28
  Filled 2021-02-21: qty 42, 7d supply, fill #0

## 2021-02-21 MED ORDER — HYDRALAZINE 25 MG TABLET
ORAL_TABLET | Freq: Four times a day (QID) | ORAL | 0 refills | 30.00 days | Status: CP
Start: 2021-02-21 — End: 2021-03-23
  Filled 2021-02-21: qty 120, 30d supply, fill #0

## 2021-02-21 MED ORDER — ASPIRIN 325 MG TABLET
ORAL_TABLET | Freq: Every day | ORAL | 11 refills | 100.00 days | Status: CP
Start: 2021-02-21 — End: ?
  Filled 2021-02-21: qty 100, 100d supply, fill #0

## 2021-02-21 MED ORDER — CLOPIDOGREL 75 MG TABLET
ORAL_TABLET | Freq: Every day | ORAL | 0 refills | 30.00000 days | Status: CP
Start: 2021-02-21 — End: 2021-03-23

## 2021-02-21 MED ORDER — LEVOTHYROXINE 50 MCG TABLET
ORAL_TABLET | Freq: Every day | ORAL | 0 refills | 30.00 days | Status: CP
Start: 2021-02-21 — End: 2021-03-23
  Filled 2021-02-21: qty 30, 30d supply, fill #0

## 2021-02-27 DIAGNOSIS — Z3490 Encounter for supervision of normal pregnancy, unspecified, unspecified trimester: Principal | ICD-10-CM

## 2021-02-27 MED ORDER — PRENATAL VITAMINS PLUS LOW IRON 27 MG IRON-1 MG TABLET
ORAL_TABLET | Freq: Every day | ORAL | 4 refills | 30 days
Start: 2021-02-27 — End: ?

## 2021-03-02 ENCOUNTER — Encounter: Payer: Self-pay | Admitting: Cardiology

## 2021-03-06 ENCOUNTER — Ambulatory Visit: Admit: 2021-03-06 | Discharge: 2021-03-07 | Payer: MEDICAID

## 2021-03-09 MED ORDER — NIFEDIPINE ER OSMOTIC RELEASE 30 MG PO TB24: 30.0000 mg | ORAL_TABLET | Freq: Every day | ORAL | 3 refills | Status: DC

## 2021-03-17 ENCOUNTER — Ambulatory Visit: Admit: 2021-03-17 | Discharge: 2021-03-18 | Payer: MEDICAID

## 2021-03-17 ENCOUNTER — Encounter: Admit: 2021-03-17 | Discharge: 2021-03-18 | Payer: MEDICAID

## 2021-03-17 DIAGNOSIS — O0991 Supervision of high risk pregnancy, unspecified, first trimester: Principal | ICD-10-CM

## 2021-03-17 DIAGNOSIS — G932 Benign intracranial hypertension: Principal | ICD-10-CM

## 2021-03-17 DIAGNOSIS — E039 Hypothyroidism, unspecified: Principal | ICD-10-CM

## 2021-03-17 DIAGNOSIS — O10919 Unspecified pre-existing hypertension complicating pregnancy, unspecified trimester: Principal | ICD-10-CM

## 2021-03-17 DIAGNOSIS — F32A Depression during pregnancy in first trimester: Principal | ICD-10-CM

## 2021-03-17 DIAGNOSIS — O99341 Other mental disorders complicating pregnancy, first trimester: Principal | ICD-10-CM

## 2021-03-17 DIAGNOSIS — Z3490 Encounter for supervision of normal pregnancy, unspecified, unspecified trimester: Principal | ICD-10-CM

## 2021-03-17 MED ORDER — NIFEDIPINE ER 30 MG TABLET,EXTENDED RELEASE 24 HR
ORAL_TABLET | Freq: Every day | ORAL | 2 refills | 90 days | Status: CP
Start: 2021-03-17 — End: 2022-03-17

## 2021-03-17 MED ORDER — LEVOTHYROXINE 75 MCG TABLET
ORAL_TABLET | Freq: Every day | ORAL | 2 refills | 90 days | Status: CP
Start: 2021-03-17 — End: 2022-03-17

## 2021-04-07 ENCOUNTER — Ambulatory Visit: Admit: 2021-04-07 | Discharge: 2021-04-08 | Payer: MEDICAID

## 2021-04-07 DIAGNOSIS — J4550 Severe persistent asthma, uncomplicated: Principal | ICD-10-CM

## 2021-04-07 DIAGNOSIS — G932 Benign intracranial hypertension: Principal | ICD-10-CM

## 2021-04-07 DIAGNOSIS — O10919 Unspecified pre-existing hypertension complicating pregnancy, unspecified trimester: Principal | ICD-10-CM

## 2021-04-07 DIAGNOSIS — O0991 Supervision of high risk pregnancy, unspecified, first trimester: Principal | ICD-10-CM

## 2021-04-07 DIAGNOSIS — E039 Hypothyroidism, unspecified: Principal | ICD-10-CM

## 2021-04-13 ENCOUNTER — Ambulatory Visit: Admit: 2021-04-13 | Discharge: 2021-04-14 | Payer: MEDICAID

## 2021-04-13 DIAGNOSIS — G932 Benign intracranial hypertension: Principal | ICD-10-CM

## 2021-04-21 ENCOUNTER — Ambulatory Visit
Admit: 2021-04-21 | Discharge: 2021-04-22 | Payer: MEDICAID | Attending: Student in an Organized Health Care Education/Training Program | Primary: Student in an Organized Health Care Education/Training Program

## 2021-04-21 DIAGNOSIS — H1132 Conjunctival hemorrhage, left eye: Principal | ICD-10-CM

## 2021-04-21 MED ORDER — ERYTHROMYCIN 5 MG/GRAM (0.5 %) EYE OINTMENT
Freq: Every evening | OPHTHALMIC | 0 refills | 5 days | Status: CP
Start: 2021-04-21 — End: 2021-04-26

## 2021-04-28 ENCOUNTER — Ambulatory Visit: Admit: 2021-04-28 | Discharge: 2021-04-29 | Payer: MEDICAID

## 2021-04-28 DIAGNOSIS — F315 Bipolar disorder, current episode depressed, severe, with psychotic features: Principal | ICD-10-CM

## 2021-04-28 DIAGNOSIS — O0991 Supervision of high risk pregnancy, unspecified, first trimester: Principal | ICD-10-CM

## 2021-04-28 DIAGNOSIS — O10919 Unspecified pre-existing hypertension complicating pregnancy, unspecified trimester: Principal | ICD-10-CM

## 2021-04-28 DIAGNOSIS — G932 Benign intracranial hypertension: Principal | ICD-10-CM

## 2021-04-28 DIAGNOSIS — J4550 Severe persistent asthma, uncomplicated: Principal | ICD-10-CM

## 2021-04-28 DIAGNOSIS — E039 Hypothyroidism, unspecified: Principal | ICD-10-CM

## 2021-04-28 MED ORDER — METOCLOPRAMIDE 10 MG TABLET
ORAL_TABLET | Freq: Every day | ORAL | 0 refills | 30 days | Status: CP | PRN
Start: 2021-04-28 — End: ?

## 2021-05-26 ENCOUNTER — Ambulatory Visit: Admit: 2021-05-26 | Discharge: 2021-05-27 | Payer: MEDICAID

## 2021-05-26 ENCOUNTER — Ambulatory Visit
Admit: 2021-05-26 | Discharge: 2021-05-27 | Payer: MEDICAID | Attending: Student in an Organized Health Care Education/Training Program | Primary: Student in an Organized Health Care Education/Training Program

## 2021-05-26 DIAGNOSIS — F315 Bipolar disorder, current episode depressed, severe, with psychotic features: Principal | ICD-10-CM

## 2021-05-26 DIAGNOSIS — O0991 Supervision of high risk pregnancy, unspecified, first trimester: Principal | ICD-10-CM

## 2021-05-26 DIAGNOSIS — O10919 Unspecified pre-existing hypertension complicating pregnancy, unspecified trimester: Principal | ICD-10-CM

## 2021-05-26 DIAGNOSIS — G932 Benign intracranial hypertension: Principal | ICD-10-CM

## 2021-05-26 DIAGNOSIS — G4733 Obstructive sleep apnea (adult) (pediatric): Principal | ICD-10-CM

## 2021-05-26 DIAGNOSIS — J4550 Severe persistent asthma, uncomplicated: Principal | ICD-10-CM

## 2021-05-26 DIAGNOSIS — F603 Borderline personality disorder: Principal | ICD-10-CM

## 2021-05-26 DIAGNOSIS — O99282 Endocrine, nutritional and metabolic diseases complicating pregnancy, second trimester: Principal | ICD-10-CM

## 2021-05-26 DIAGNOSIS — E039 Hypothyroidism, unspecified: Principal | ICD-10-CM

## 2021-06-01 ENCOUNTER — Telehealth: Payer: Self-pay | Admitting: Cardiology

## 2021-06-01 ENCOUNTER — Ambulatory Visit: Admit: 2021-06-01 | Discharge: 2021-06-02 | Payer: MEDICAID

## 2021-06-01 DIAGNOSIS — G932 Benign intracranial hypertension: Principal | ICD-10-CM

## 2021-06-01 MED ORDER — ACETAZOLAMIDE ER 500 MG CAPSULE,EXTENDED RELEASE
ORAL_CAPSULE | Freq: Two times a day (BID) | ORAL | 2 refills | 15 days | Status: CP
Start: 2021-06-01 — End: 2021-08-30

## 2021-06-01 NOTE — Telephone Encounter (Signed)
Pt c/o of Chest Pain: STAT if CP now or developed within 24 hours ? ?1. Are you having CP right now? no ? ?2. Are you experiencing any other symptoms (ex. SOB, nausea, vomiting, sweating)? Headaches, sweating, SOB. Patient is also pregnant  ? ?3. How long have you been experiencing CP? Since medication change 05/24/21 ? ?4. Is your CP continuous or coming and going? Comes and goes  ? ?5. Have you taken Nitroglycerin? no ? ?Pt c/o medication issue: ? ?1. Name of Medication:  ?aspirin EC 325 MG tablet  ?clopidogrel (PLAVIX) 75 MG tablet ? ?2. How are you currently taking this medication (dosage and times per day)? Patient has not taken since 05/24/21. Meds were d/c by her Neurologist  ?Patient states she is only taking 81 mg Aspirin daily  ? ?3. Are you having a reaction (difficulty breathing--STAT)? Intermittent Chest Pain ? ?4. What is your medication issue? Patient was told to reach out to her Cardiologist to discuss her symptoms and determine if the symptoms may have been caused by a change in the medicine  ?

## 2021-06-01 NOTE — Telephone Encounter (Signed)
Message left for pt to call back.

## 2021-06-02 NOTE — Telephone Encounter (Signed)
Left VM for pt to call back.

## 2021-06-02 NOTE — Telephone Encounter (Signed)
Pt states that she has intermittent chest pain that started after stopping Plavix and Asa. I advised her to see her PCP as her sx sound as they are related to her asthma. Pt verbalized understanding and had no additional questions. ?

## 2021-06-10 ENCOUNTER — Ambulatory Visit: Admit: 2021-06-10 | Discharge: 2021-06-12 | Payer: MEDICAID

## 2021-06-10 ENCOUNTER — Ambulatory Visit: Admit: 2021-06-10 | Discharge: 2021-06-12 | Disposition: A | Discharge status: Still In Hospital | Payer: MEDICAID

## 2021-06-10 ENCOUNTER — Encounter
Admit: 2021-06-10 | Discharge: 2021-06-12 | Payer: MEDICAID | Attending: Student in an Organized Health Care Education/Training Program | Primary: Student in an Organized Health Care Education/Training Program

## 2021-06-12 MED ORDER — SERTRALINE 100 MG TABLET
ORAL_TABLET | Freq: Every day | ORAL | 3 refills | 30 days | Status: CP
Start: 2021-06-12 — End: 2021-10-10

## 2021-06-12 MED ORDER — METOCLOPRAMIDE 10 MG TABLET
ORAL_TABLET | Freq: Three times a day (TID) | ORAL | 0 refills | 30 days | Status: CP
Start: 2021-06-12 — End: 2021-07-12

## 2021-06-12 MED ORDER — ACETAMINOPHEN 500 MG TABLET
ORAL_TABLET | Freq: Three times a day (TID) | ORAL | 0 refills | 5 days | Status: CP
Start: 2021-06-12 — End: ?

## 2021-06-12 MED ORDER — QUETIAPINE 25 MG TABLET
ORAL_TABLET | Freq: Three times a day (TID) | ORAL | 0 refills | 20 days | Status: CP | PRN
Start: 2021-06-12 — End: 2021-07-12

## 2021-06-12 MED ORDER — DIPHENHYDRAMINE 25 MG TABLET
Freq: Four times a day (QID) | ORAL | 0 refills | 8 days | Status: CP | PRN
Start: 2021-06-12 — End: ?

## 2021-06-12 MED ORDER — HYDRALAZINE 25 MG TABLET
ORAL_TABLET | Freq: Four times a day (QID) | ORAL | 0 refills | 30 days | Status: CP
Start: 2021-06-12 — End: 2021-07-12

## 2021-06-12 MED ORDER — MAGNESIUM OXIDE 400 MG (241.3 MG MAGNESIUM) TABLET
ORAL_TABLET | Freq: Every day | ORAL | 11 refills | 30 days | Status: CP
Start: 2021-06-12 — End: 2022-06-12

## 2021-06-12 MED ORDER — ACETAZOLAMIDE ER 500 MG CAPSULE,EXTENDED RELEASE
ORAL_CAPSULE | Freq: Two times a day (BID) | ORAL | 3 refills | 30 days | Status: CP
Start: 2021-06-12 — End: 2021-10-10

## 2021-06-16 ENCOUNTER — Ambulatory Visit: Admit: 2021-06-16 | Discharge: 2021-06-17 | Payer: MEDICAID

## 2021-06-16 ENCOUNTER — Ambulatory Visit
Admit: 2021-06-16 | Discharge: 2021-06-17 | Payer: MEDICAID | Attending: Obstetrics & Gynecology | Primary: Obstetrics & Gynecology

## 2021-06-16 DIAGNOSIS — Z309 Encounter for contraceptive management, unspecified: Secondary | ICD-10-CM | POA: Insufficient documentation

## 2021-06-16 DIAGNOSIS — O0992 Supervision of high risk pregnancy, unspecified, second trimester: Principal | ICD-10-CM

## 2021-06-16 MED ORDER — SUMATRIPTAN 100 MG TABLET
ORAL_TABLET | Freq: Once | ORAL | 2 refills | 0 days | Status: CP | PRN
Start: 2021-06-16 — End: 2022-06-16

## 2021-06-22 ENCOUNTER — Ambulatory Visit: Admit: 2021-06-22 | Discharge: 2021-06-23 | Payer: MEDICAID

## 2021-06-22 DIAGNOSIS — H4710 Unspecified papilledema: Principal | ICD-10-CM

## 2021-06-22 DIAGNOSIS — G932 Benign intracranial hypertension: Principal | ICD-10-CM

## 2021-06-25 ENCOUNTER — Encounter: Payer: Self-pay | Admitting: Cardiology

## 2021-06-25 ENCOUNTER — Ambulatory Visit (INDEPENDENT_AMBULATORY_CARE_PROVIDER_SITE_OTHER): Payer: Medicaid Other | Admitting: Cardiology

## 2021-06-25 VITALS — BP 142/80 | HR 88 | Ht 63.0 in | Wt 380.2 lb

## 2021-06-25 DIAGNOSIS — F315 Bipolar disorder, current episode depressed, severe, with psychotic features: Secondary | ICD-10-CM | POA: Diagnosis not present

## 2021-06-25 DIAGNOSIS — I10 Essential (primary) hypertension: Secondary | ICD-10-CM | POA: Diagnosis not present

## 2021-06-25 DIAGNOSIS — Z9989 Dependence on other enabling machines and devices: Secondary | ICD-10-CM

## 2021-06-25 DIAGNOSIS — G4733 Obstructive sleep apnea (adult) (pediatric): Secondary | ICD-10-CM | POA: Diagnosis not present

## 2021-06-25 MED ORDER — NIFEDIPINE ER OSMOTIC RELEASE 60 MG PO TB24: 60.0000 mg | ORAL_TABLET | Freq: Every day | ORAL | 5 refills | Status: DC

## 2021-06-25 NOTE — Patient Instructions (Signed)
Medication Instructions:  Your physician has recommended you make the following change in your medication:  Start Procardia XL 60 mg once daily  *If you need a refill on your cardiac medications before your next appointment, please call your pharmacy*   Lab Work: NONE If you have labs (blood work) drawn today and your tests are completely normal, you will receive your results only by: MyChart Message (if you have MyChart) OR A paper copy in the mail If you have any lab test that is abnormal or we need to change your treatment, we will call you to review the results.   Testing/Procedures: NONE   Follow-Up: At [facility], you and your health needs are our priority.  As part of our continuing mission to provide you with exceptional heart care, we have created designated Provider Care Teams.  These Care Teams include your primary Cardiologist (physician) and Advanced Practice Providers (APPs -  Physician Assistants and Nurse Practitioners) who all work together to provide you with the care you need, when you need it.  We recommend signing up for the patient portal called "MyChart".  Sign up information is provided on this After Visit Summary.  MyChart is used to connect with patients for Virtual Visits (Telemedicine).  Patients are able to view lab/test results, encounter notes, upcoming appointments, etc.  Non-urgent messages can be sent to your provider as well.   To learn more about what you can do with MyChart, go to [email or web address].    Your next appointment:   3 month(s)  The format for your next appointment:   In Person  Provider:   [name], MD    Other Instructions   Important Information About Sugar

## 2021-06-25 NOTE — Progress Notes (Signed)
Cardiology Office Note:    Date:  06/25/2021   ID:  Misty [name], DOB Sep 02, 1995, MRN [id number]  PCP:  Jerrye BushyPoe, Chelsea R, FNP  Cardiologist:  Gypsy Balsamobert Bryley Chrisman, MD    Referring MD: Jerrye BushyPoe, Chelsea R, FNP   Chief Complaint  Patient presents with   BP fluctuation    History of Present Illness:    Misty [name] is a 26 y.o. female with past medical history significant for essential hypertension, bipolar disorder, morbid obesity, intracranial hypertension she did have shunt done in October, she is pregnant at delivery due is in September.  She was referred back to us because of blood pressure still being elevated.  She is taking Procardia with good tolerance.  Apparently everything is fine with her baby.  Past Medical History:  Diagnosis Date   Allergic rhinitis    Anxiety    Bipolar 1 disorder (HCC) 04/15/2017   Bipolar 1 disorder, depressed, severe (HCC) 12/16/2017   Bipolar I disorder (HCC) 10/10/2015   BMI 70 and over, adult (HCC) 12/03/2015   Borderline personality disorder (HCC) 03/14/2015   Depression    Fatty liver 10/10/2015   GAD (generalized anxiety disorder) 07/19/2012   Gastrointestinal disease 05/16/2013   GERD (gastroesophageal reflux disease)    H/O ASTHMA 10/08/2014   HTN (hypertension) 07/13/12   Hypertension 05/16/2013   IBS (irritable bowel syndrome)    Increased frequency of urination 04/12/2014   INTERMITTENT URTICARIA 10/08/2014   Intractable migraine without aura with status migrainosus 05/16/2013   Iron deficiency 10/10/2015   Iron deficiency anemia 12/11/2015   Lithium toxicity 04/05/2016   Major depressive disorder, recurrent, 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   MDD (major depressive disorder) 11/18/2018   MDD (major depressive disorder), recurrent episode, moderate (HCC) 03/01/2012   MDD (major depressive disorder), recurrent episode, severe (HCC) 03/15/2019   Metabolic syndrome 09/04/2012   Morbid obesity (HCC) 04/03/2015   Obesity    Obesity affecting pregnancy in  second trimester 04/11/2020   Obstructive sleep apnea 07/19/2012   ODD (oppositional defiant disorder) 07/19/2012   OSA on CPAP    Palpitations 07/21/2015   Posttraumatic stress disorder 09/04/2014   Primary hypertension 07/13/2012   Formatting of this note might be different from the original. Cardiac ECHO 07/23/15: Technically difficult study due to habitus Left ventricular cavity is normal in size. Normal diastolic filling pattern. Calculated  EF 71% Left atrial cavitiy mildly dilated Trace TR   Psoriasis    Schizoaffective disorder, depressive type (HCC) 03/23/2017   Sinus tachycardia 08/07/2014   Supervision of high risk pregnancy in second trimester 05/25/2020   Last Assessment & Plan:  Formatting of this note might be different from the original.  Discussed age related risk for fetal aneuploidy  Reviewed previous noninvasive prenatal screening attempts (Panorama through [facility] lab)- no results obtained due to low fetal fraction  Discussed various reasons for low fetal fraction including increased maternal BMI, early gestational age, maternal use of an   Swelling of both lower extremities 12/14/2016   Urge incontinence of urine 04/12/2014    Past Surgical History:  Procedure Laterality Date   ADENOIDECTOMY     SINOSCOPY     TONSILLECTOMY AND ADENOIDECTOMY  Age 26   Deviated septum corrected at same time    Current Medications: Current Meds  Medication Sig   acetaminophen (TYLENOL) 325 MG suppository Place 325 mg rectally every 8 (eight) hours as needed for mild pain.   acetaZOLAMIDE (DIAMOX) 250 MG tablet Take 2,000 mg by mouth every  12 (twelve) hours.   acetaZOLAMIDE ER (DIAMOX) 500 MG capsule Take 1,500 mg by mouth 2 (two) times daily.   aspirin EC 81 MG tablet Take 81 mg by mouth daily. Swallow whole.   buPROPion (WELLBUTRIN XL) 300 MG 24 hr tablet Take 450 mg by mouth every morning.   cholecalciferol (VITAMIN D) 25 MCG (1000 UNIT) tablet Take 1,000 Units by mouth daily.   CIMZIA 2 X  200 MG/ML PSKT Inject 400 mg into the skin every 14 (fourteen) days.   clopidogrel (PLAVIX) 75 MG tablet Take 75 mg by mouth in the morning.   Docusate Sodium (DSS) 100 MG CAPS Take 100 mg by mouth daily.   doxycycline (VIBRA-TABS) 100 MG tablet Take 100 mg by mouth 2 (two) times daily.   ferrous sulfate 325 (65 FE) MG tablet Take 325 mg by mouth every other day.   FLOVENT HFA 110 MCG/ACT inhaler Inhale 2 puffs into the lungs 2 (two) times daily.   hydrALAZINE (APRESOLINE) 25 MG tablet Take 1 tablet (25 mg total) by mouth 4 (four) times daily.   levothyroxine (SYNTHROID) 50 MCG tablet Take 1 tablet (50 mcg total) by mouth daily at 6 (six) AM. (Patient taking differently: Take 75 mcg by mouth daily at 6 (six) AM.)   metoCLOPramide (REGLAN) 10 MG tablet Take 10 mg by mouth every 8 (eight) hours.   montelukast (SINGULAIR) 10 MG tablet Take 1 tablet (10 mg total) by mouth at bedtime.   NIFEdipine (ADALAT CC) 30 MG 24 hr tablet Take 30 mg by mouth daily.   NIFEdipine (PROCARDIA XL) 30 MG 24 hr tablet Take 1 tablet (30 mg total) by mouth daily.   pantoprazole (PROTONIX) 40 MG tablet Take 40 mg by mouth daily.   QUEtiapine (SEROQUEL) 25 MG tablet Take 12.5 mg by mouth 3 (three) times daily as needed for sleep.   riboflavin (VITAMIN B-2) 100 MG TABS tablet Take 100 mg by mouth daily.   sertraline (ZOLOFT) 100 MG tablet Take 200 mg by mouth daily.   Simethicone (GAS RELIEF 125 MAX ST PO) Take 1 tablet by mouth in the morning and at bedtime.   SUMAtriptan (IMITREX) 100 MG tablet Take 100 mg by mouth once as needed for headache.   VENTOLIN HFA 108 (90 Base) MCG/ACT inhaler Inhale 2 puffs into the lungs every 4 (four) hours as needed for wheezing or shortness of breath.   [DISCONTINUED] aspirin EC 325 MG tablet Take 325 mg by mouth daily.   [DISCONTINUED] hydrOXYzine (VISTARIL) 25 MG capsule Take 25-50 mg by mouth at bedtime as needed (sleep).   [DISCONTINUED] melatonin 3 MG TABS tablet Take 3 mg by mouth  at bedtime as needed (sleep).   [DISCONTINUED] Multiple Vitamins-Minerals (HAIR SKIN AND NAILS FORMULA) TABS Take 1 tablet by mouth in the morning.   [DISCONTINUED] topiramate (TOPAMAX) 25 MG tablet Take 25 mg by mouth 2 (two) times daily.     Allergies:   Other, Ultram [tramadol], Etanercept, Molds & smuts, Tramadol hcl, Ziprasidone, Ziprasidone hcl, and Adhesive [tape]   Social History   Socioeconomic History   Marital status: Significant Other    Spouse name: Not on file   Number of children: Not on file   Years of education: Not on file   Highest education level: Not on file  Occupational History   Occupation: STUDENT    Employer: [employer]    Comment: 11th grade [employer]  Tobacco Use   Smoking status: Never   Smokeless tobacco: Never  Substance and Sexual Activity   Alcohol use: Yes    Comment: socially   Drug use: No   Sexual activity: Not Currently  Other Topics Concern   Not on file  Social History Narrative   Not on file   Social Determinants of Health   Financial Resource Strain: Not on file  Food Insecurity: Not on file  Transportation Needs: Not on file  Physical Activity: Not on file  Stress: Not on file  Social Connections: Not on file     Family History: The patient's family history includes Allergic rhinitis in her mother; Bipolar disorder in her father; Depression in her maternal grandmother and mother; Diabetes in her maternal grandmother; Drug abuse in her father; Hypertension in her father and maternal grandmother. ROS:   Please see the history of present illness.    All 14 point review of systems negative except as described per history of present illness  EKGs/Labs/Other Studies Reviewed:      Recent Labs: 02/05/2021: ALT 24; BUN 18; Creatinine, Ser 0.50; Hemoglobin 14.6; Platelets 284; Potassium 3.6; Sodium 142  Recent Lipid Panel    Component Value Date/Time   CHOL 155 04/17/2017 0644   TRIG 135 04/17/2017 0644   HDL 46 04/17/2017  0644   CHOLHDL 3.4 04/17/2017 0644   VLDL 27 04/17/2017 0644   LDLCALC 82 04/17/2017 0644    Physical Exam:    VS:  BP (!) 142/80 (BP Location: Left Arm, Patient Position: Sitting)   Pulse 88   Ht  (1.6 m)   Wt (!) 380 lb 3.2 oz (172.5 kg)   SpO2 97%   BMI 67.35 kg/m     Wt Readings from Last 3 Encounters:  06/25/21 (!) 380 lb 3.2 oz (172.5 kg)  02/05/21 (!) 396 lb 13.3 oz (180 kg)  12/25/20 (!) 372 lb 6.4 oz (168.9 kg)     GEN:  Well nourished, well developed in no acute distress HEENT: Normal NECK: No JVD; No carotid bruits LYMPHATICS: No lymphadenopathy CARDIAC: RRR, no murmurs, no rubs, no gallops RESPIRATORY:  Clear to auscultation without rales, wheezing or rhonchi  ABDOMEN: Soft, non-tender, non-distended MUSCULOSKELETAL:  No edema; No deformity  SKIN: Warm and dry LOWER EXTREMITIES: no swelling NEUROLOGIC:  Alert and oriented x 3 PSYCHIATRIC:  Normal affect   ASSESSMENT:    1. Primary hypertension   2. OSA on CPAP   3. Morbid obesity (HCC)   4. Bipolar I disorder, current or most recent episode depressed, with psychotic features (HCC)    PLAN:    In order of problems listed above:  Essential hypertension still little bit elevated.  The goal will be to increase dose of Procardia to 60 mg daily, I will also ask her to have EKG done today.  She does have blood pressure monitor however she check blood pressure on the wrist I told her this is not the best way to check her blood pressure.  She should have monitor on the shoulder it is probably have to be special order because of the size of her arm.  I warned her also that the most dangerous is to low blood pressure and then too high.  However small increase in nifedipine hopefully will help with blood pressure without significantly dropping it. Obstructive sleep apnea she does use CPAP mask. For GERD with history obesity problem. Dyspnea on exertion present but she is morbidly obese now  pregnant   Medication Adjustments/Labs and Tests Ordered: Current medicines are reviewed at length  with the patient today.  Concerns regarding medicines are outlined above.  No orders of the defined types were placed in this encounter.  Medication changes: No orders of the defined types were placed in this encounter.   Signed, Georgeanna Lea, MD, [facility] 06/25/2021 1:24 PM    [facility]

## 2021-06-30 ENCOUNTER — Ambulatory Visit: Admit: 2021-06-30 | Discharge: 2021-06-30 | Payer: MEDICAID

## 2021-06-30 ENCOUNTER — Ambulatory Visit
Admit: 2021-06-30 | Discharge: 2021-06-30 | Payer: MEDICAID | Attending: Student in an Organized Health Care Education/Training Program | Primary: Student in an Organized Health Care Education/Training Program

## 2021-06-30 DIAGNOSIS — H4710 Unspecified papilledema: Principal | ICD-10-CM

## 2021-06-30 DIAGNOSIS — F315 Bipolar disorder, current episode depressed, severe, with psychotic features: Principal | ICD-10-CM

## 2021-06-30 DIAGNOSIS — G932 Benign intracranial hypertension: Principal | ICD-10-CM

## 2021-06-30 DIAGNOSIS — O10919 Unspecified pre-existing hypertension complicating pregnancy, unspecified trimester: Principal | ICD-10-CM

## 2021-06-30 DIAGNOSIS — O0991 Supervision of high risk pregnancy, unspecified, first trimester: Principal | ICD-10-CM

## 2021-07-02 MED ORDER — LORATADINE 10 MG TABLET
ORAL_TABLET | Freq: Every day | ORAL | 2 refills | 30 days | Status: CP
Start: 2021-07-02 — End: 2022-07-02

## 2021-07-07 ENCOUNTER — Ambulatory Visit: Admit: 2021-07-07 | Discharge: 2021-07-07 | Payer: MEDICAID

## 2021-07-07 DIAGNOSIS — N760 Acute vaginitis: Principal | ICD-10-CM

## 2021-07-07 MED ORDER — FLUCONAZOLE 150 MG TABLET
ORAL_TABLET | Freq: Once | ORAL | 0 refills | 1.00000 days | Status: CP
Start: 2021-07-07 — End: 2021-07-07

## 2021-07-07 MED ORDER — METRONIDAZOLE 500 MG TABLET
ORAL_TABLET | Freq: Two times a day (BID) | ORAL | 0 refills | 7.00000 days | Status: CP
Start: 2021-07-07 — End: 2021-07-14

## 2021-07-08 MED ORDER — METRONIDAZOLE 500 MG TABLET
ORAL_TABLET | Freq: Two times a day (BID) | ORAL | 0 refills | 7 days | Status: CP
Start: 2021-07-08 — End: 2021-07-15

## 2021-07-13 MED ORDER — QUETIAPINE 25 MG TABLET
ORAL_TABLET | 0 refills | 0 days | Status: CP
Start: 2021-07-13 — End: ?

## 2021-07-16 ENCOUNTER — Ambulatory Visit
Admit: 2021-07-16 | Discharge: 2021-07-17 | Payer: MEDICAID | Attending: Student in an Organized Health Care Education/Training Program | Primary: Student in an Organized Health Care Education/Training Program

## 2021-07-16 ENCOUNTER — Ambulatory Visit: Admit: 2021-07-16 | Discharge: 2021-07-17 | Payer: MEDICAID

## 2021-07-16 DIAGNOSIS — Z1389 Encounter for screening for other disorder: Principal | ICD-10-CM

## 2021-07-16 DIAGNOSIS — O0991 Supervision of high risk pregnancy, unspecified, first trimester: Principal | ICD-10-CM

## 2021-07-16 DIAGNOSIS — F315 Bipolar disorder, current episode depressed, severe, with psychotic features: Principal | ICD-10-CM

## 2021-07-16 DIAGNOSIS — O10919 Unspecified pre-existing hypertension complicating pregnancy, unspecified trimester: Principal | ICD-10-CM

## 2021-07-16 DIAGNOSIS — G932 Benign intracranial hypertension: Principal | ICD-10-CM

## 2021-07-20 MED ORDER — METOCLOPRAMIDE 10 MG TABLET
ORAL_TABLET | Freq: Three times a day (TID) | ORAL | 0 refills | 30 days | Status: CP
Start: 2021-07-20 — End: 2021-08-19

## 2021-07-28 MED ORDER — BUPROPION HCL XL 150 MG 24 HR TABLET, EXTENDED RELEASE
ORAL_TABLET | Freq: Every morning | ORAL | 4 refills | 30 days
Start: 2021-07-28 — End: ?

## 2021-07-31 ENCOUNTER — Ambulatory Visit: Admit: 2021-07-31 | Discharge: 2021-08-01 | Payer: PRIVATE HEALTH INSURANCE

## 2021-07-31 DIAGNOSIS — O10919 Unspecified pre-existing hypertension complicating pregnancy, unspecified trimester: Principal | ICD-10-CM

## 2021-07-31 DIAGNOSIS — O99282 Endocrine, nutritional and metabolic diseases complicating pregnancy, second trimester: Principal | ICD-10-CM

## 2021-07-31 DIAGNOSIS — Z6791 Unspecified blood type, Rh negative: Principal | ICD-10-CM

## 2021-07-31 DIAGNOSIS — G932 Benign intracranial hypertension: Principal | ICD-10-CM

## 2021-07-31 DIAGNOSIS — O0992 Supervision of high risk pregnancy, unspecified, second trimester: Principal | ICD-10-CM

## 2021-07-31 DIAGNOSIS — Z98891 History of uterine scar from previous surgery: Principal | ICD-10-CM

## 2021-07-31 DIAGNOSIS — F315 Bipolar disorder, current episode depressed, severe, with psychotic features: Principal | ICD-10-CM

## 2021-07-31 DIAGNOSIS — L409 Psoriasis, unspecified: Principal | ICD-10-CM

## 2021-07-31 DIAGNOSIS — O26899 Other specified pregnancy related conditions, unspecified trimester: Principal | ICD-10-CM

## 2021-07-31 DIAGNOSIS — J4550 Severe persistent asthma, uncomplicated: Principal | ICD-10-CM

## 2021-07-31 DIAGNOSIS — E039 Hypothyroidism, unspecified: Principal | ICD-10-CM

## 2021-08-03 ENCOUNTER — Ambulatory Visit: Admit: 2021-08-03 | Discharge: 2021-08-04 | Payer: PRIVATE HEALTH INSURANCE

## 2021-08-03 DIAGNOSIS — H4710 Unspecified papilledema: Principal | ICD-10-CM

## 2021-08-03 DIAGNOSIS — G932 Benign intracranial hypertension: Principal | ICD-10-CM

## 2021-08-05 ENCOUNTER — Ambulatory Visit: Admit: 2021-08-05 | Discharge: 2021-08-06 | Payer: PRIVATE HEALTH INSURANCE | Attending: Family | Primary: Family

## 2021-08-05 DIAGNOSIS — J0100 Acute maxillary sinusitis, unspecified: Principal | ICD-10-CM

## 2021-08-05 DIAGNOSIS — R0981 Nasal congestion: Principal | ICD-10-CM

## 2021-08-05 DIAGNOSIS — R059 Cough, unspecified type: Principal | ICD-10-CM

## 2021-08-05 DIAGNOSIS — H9202 Otalgia, left ear: Principal | ICD-10-CM

## 2021-08-05 MED ORDER — AMOXICILLIN 875 MG-POTASSIUM CLAVULANATE 125 MG TABLET
ORAL_TABLET | Freq: Two times a day (BID) | ORAL | 0 refills | 7 days | Status: CP
Start: 2021-08-05 — End: 2021-08-12

## 2021-08-06 MED ORDER — NIFEDIPINE ER 30 MG TABLET,EXTENDED RELEASE 24 HR
ORAL_TABLET | Freq: Every day | ORAL | 5 refills | 30 days
Start: 2021-08-06 — End: ?

## 2021-08-10 ENCOUNTER — Ambulatory Visit: Admit: 2021-08-10 | Discharge: 2021-08-11 | Disposition: A | Discharge status: Home / Self Care | Payer: MEDICAID

## 2021-08-10 ENCOUNTER — Encounter
Admit: 2021-08-10 | Discharge: 2021-08-11 | Disposition: A | Discharge status: Home / Self Care | Payer: MEDICAID | Attending: Maternal & Fetal Medicine

## 2021-08-11 MED ORDER — HYDRALAZINE 25 MG TABLET
ORAL_TABLET | Freq: Four times a day (QID) | ORAL | 0 refills | 30 days | Status: CP
Start: 2021-08-11 — End: 2021-09-10

## 2021-08-11 MED ORDER — NIFEDIPINE ER 60 MG TABLET,EXTENDED RELEASE 24 HR
ORAL_TABLET | Freq: Two times a day (BID) | ORAL | 3 refills | 45 days | Status: CP
Start: 2021-08-11 — End: 2022-08-11

## 2021-08-11 MED ORDER — RIBOFLAVIN (VITAMIN B2) 100 MG TABLET
ORAL_TABLET | Freq: Every day | ORAL | 3 refills | 45 days | Status: CP
Start: 2021-08-11 — End: ?

## 2021-08-11 MED ORDER — LIDOCAINE 5 % TOPICAL PATCH
MEDICATED_PATCH | Freq: Every day | TRANSDERMAL | 0 refills | 30 days | Status: CP | PRN
Start: 2021-08-11 — End: 2021-09-10

## 2021-08-13 ENCOUNTER — Ambulatory Visit: Admit: 2021-08-13 | Discharge: 2021-08-14 | Payer: MEDICAID

## 2021-08-13 DIAGNOSIS — O99282 Endocrine, nutritional and metabolic diseases complicating pregnancy, second trimester: Principal | ICD-10-CM

## 2021-08-13 DIAGNOSIS — L409 Psoriasis, unspecified: Principal | ICD-10-CM

## 2021-08-13 DIAGNOSIS — G932 Benign intracranial hypertension: Principal | ICD-10-CM

## 2021-08-13 DIAGNOSIS — F315 Bipolar disorder, current episode depressed, severe, with psychotic features: Principal | ICD-10-CM

## 2021-08-13 DIAGNOSIS — O0992 Supervision of high risk pregnancy, unspecified, second trimester: Principal | ICD-10-CM

## 2021-08-13 DIAGNOSIS — Z6791 Unspecified blood type, Rh negative: Principal | ICD-10-CM

## 2021-08-13 DIAGNOSIS — O10919 Unspecified pre-existing hypertension complicating pregnancy, unspecified trimester: Principal | ICD-10-CM

## 2021-08-13 DIAGNOSIS — E039 Hypothyroidism, unspecified: Principal | ICD-10-CM

## 2021-08-13 DIAGNOSIS — G4733 Obstructive sleep apnea (adult) (pediatric): Principal | ICD-10-CM

## 2021-08-13 DIAGNOSIS — F32A Depression during pregnancy in second trimester: Principal | ICD-10-CM

## 2021-08-13 DIAGNOSIS — J4550 Severe persistent asthma, uncomplicated: Principal | ICD-10-CM

## 2021-08-13 DIAGNOSIS — O99342 Other mental disorders complicating pregnancy, second trimester: Principal | ICD-10-CM

## 2021-08-13 DIAGNOSIS — O26899 Other specified pregnancy related conditions, unspecified trimester: Principal | ICD-10-CM

## 2021-08-13 DIAGNOSIS — F603 Borderline personality disorder: Principal | ICD-10-CM

## 2021-08-13 DIAGNOSIS — Z98891 History of uterine scar from previous surgery: Principal | ICD-10-CM

## 2021-08-13 MED ORDER — FLUCONAZOLE 150 MG TABLET
ORAL_TABLET | Freq: Once | ORAL | 0 refills | 1 days | Status: CP
Start: 2021-08-13 — End: 2021-08-13

## 2021-08-18 ENCOUNTER — Ambulatory Visit: Admit: 2021-08-18 | Discharge: 2021-08-19 | Payer: MEDICAID

## 2021-08-18 DIAGNOSIS — F315 Bipolar disorder, current episode depressed, severe, with psychotic features: Principal | ICD-10-CM

## 2021-08-18 DIAGNOSIS — J45909 Unspecified asthma, uncomplicated: Principal | ICD-10-CM

## 2021-08-18 DIAGNOSIS — Z6791 Unspecified blood type, Rh negative: Principal | ICD-10-CM

## 2021-08-18 DIAGNOSIS — O0993 Supervision of high risk pregnancy, unspecified, third trimester: Principal | ICD-10-CM

## 2021-08-18 DIAGNOSIS — O26899 Other specified pregnancy related conditions, unspecified trimester: Principal | ICD-10-CM

## 2021-08-18 DIAGNOSIS — E039 Hypothyroidism, unspecified: Principal | ICD-10-CM

## 2021-08-18 DIAGNOSIS — G4733 Obstructive sleep apnea (adult) (pediatric): Principal | ICD-10-CM

## 2021-08-18 DIAGNOSIS — O34219 Maternal care for unspecified type scar from previous cesarean delivery: Principal | ICD-10-CM

## 2021-08-18 DIAGNOSIS — O99210 Obesity complicating pregnancy, unspecified trimester: Principal | ICD-10-CM

## 2021-08-18 DIAGNOSIS — O99283 Endocrine, nutritional and metabolic diseases complicating pregnancy, third trimester: Principal | ICD-10-CM

## 2021-08-18 DIAGNOSIS — L409 Psoriasis, unspecified: Principal | ICD-10-CM

## 2021-08-18 DIAGNOSIS — O99513 Diseases of the respiratory system complicating pregnancy, third trimester: Principal | ICD-10-CM

## 2021-08-18 DIAGNOSIS — G932 Benign intracranial hypertension: Principal | ICD-10-CM

## 2021-08-18 DIAGNOSIS — O10919 Unspecified pre-existing hypertension complicating pregnancy, unspecified trimester: Principal | ICD-10-CM

## 2021-08-25 ENCOUNTER — Ambulatory Visit: Admit: 2021-08-25 | Discharge: 2021-08-26 | Payer: MEDICAID

## 2021-08-25 DIAGNOSIS — E039 Hypothyroidism, unspecified: Principal | ICD-10-CM

## 2021-08-25 MED ORDER — PROMETHAZINE 25 MG TABLET
ORAL_TABLET | Freq: Four times a day (QID) | ORAL | 1 refills | 8 days | Status: CP | PRN
Start: 2021-08-25 — End: ?

## 2021-08-26 MED ORDER — NIFEDIPINE ER 60 MG TABLET,EXTENDED RELEASE 24 HR
ORAL_TABLET | Freq: Every day | ORAL | 3 refills | 90 days | Status: CP
Start: 2021-08-26 — End: 2022-08-26

## 2021-08-26 MED ORDER — RIBOFLAVIN (VITAMIN B2) 100 MG TABLET
ORAL_TABLET | Freq: Every day | ORAL | 3 refills | 45 days | Status: CP
Start: 2021-08-26 — End: ?

## 2021-08-27 MED ORDER — METOCLOPRAMIDE 10 MG TABLET
ORAL_TABLET | Freq: Three times a day (TID) | ORAL | 0 refills | 30 days | Status: CP
Start: 2021-08-27 — End: 2021-09-26

## 2021-08-31 ENCOUNTER — Encounter: Admit: 2021-08-31 | Discharge: 2021-09-04 | Payer: MEDICAID | Attending: Anesthesiology

## 2021-08-31 ENCOUNTER — Encounter: Admit: 2021-08-31 | Discharge: 2021-09-04 | Payer: MEDICAID | Attending: Obstetrics & Gynecology

## 2021-08-31 ENCOUNTER — Ambulatory Visit: Admit: 2021-08-31 | Discharge: 2021-09-04 | Payer: MEDICAID

## 2021-08-31 ENCOUNTER — Ambulatory Visit
Admit: 2021-08-31 | Discharge: 2021-09-04 | Disposition: A | Discharge status: Home / Self Care | Payer: MEDICAID | Admitting: Obstetrics & Gynecology

## 2021-08-31 ENCOUNTER — Encounter
Admit: 2021-08-31 | Discharge: 2021-09-04 | Disposition: A | Discharge status: Home / Self Care | Payer: MEDICAID | Attending: Obstetrics & Gynecology | Admitting: Obstetrics & Gynecology

## 2021-09-04 MED ORDER — IBUPROFEN 600 MG TABLET
ORAL_TABLET | Freq: Four times a day (QID) | ORAL | 0 refills | 23 days | Status: CP | PRN
Start: 2021-09-04 — End: ?
  Filled 2021-09-04: qty 90, 23d supply, fill #0

## 2021-09-04 MED ORDER — ACETAMINOPHEN ER 650 MG TABLET,EXTENDED RELEASE
ORAL_TABLET | Freq: Four times a day (QID) | ORAL | 1 refills | 25 days | Status: CP
Start: 2021-09-04 — End: ?

## 2021-09-04 MED ORDER — NIFEDIPINE ER 60 MG TABLET,EXTENDED RELEASE 24 HR
ORAL_TABLET | Freq: Two times a day (BID) | ORAL | 3 refills | 45 days | Status: CP
Start: 2021-09-04 — End: 2022-09-04
  Filled 2021-11-19: qty 60, 30d supply, fill #0

## 2021-09-04 MED ORDER — OXYCODONE 5 MG TABLET
ORAL_TABLET | Freq: Four times a day (QID) | ORAL | 0 refills | 3 days | Status: CP | PRN
Start: 2021-09-04 — End: 2021-09-09
  Filled 2021-09-04: qty 10, 3d supply, fill #0

## 2021-09-05 MED ORDER — LEVOTHYROXINE 50 MCG TABLET
ORAL_TABLET | Freq: Every day | ORAL | 0 refills | 30 days | Status: CP
Start: 2021-09-05 — End: 2021-10-05
  Filled 2021-09-04: qty 30, 30d supply, fill #0

## 2021-09-06 ENCOUNTER — Ambulatory Visit: Admit: 2021-09-06 | Discharge: 2021-09-07 | Disposition: A | Discharge status: Home / Self Care | Payer: MEDICAID

## 2021-09-06 DIAGNOSIS — F53 Postpartum depression: Principal | ICD-10-CM

## 2021-09-06 MED ORDER — SERTRALINE 50 MG TABLET
ORAL_TABLET | Freq: Every day | ORAL | 0 refills | 30 days | Status: CP
Start: 2021-09-06 — End: 2021-10-06
  Filled 2021-09-07: qty 150, 30d supply, fill #0

## 2021-09-13 ENCOUNTER — Ambulatory Visit: Admit: 2021-09-13 | Discharge: 2021-09-14 | Payer: MEDICAID

## 2021-09-13 ENCOUNTER — Ambulatory Visit: Admit: 2021-09-13 | Discharge: 2021-09-19 | Payer: MEDICAID

## 2021-09-13 DIAGNOSIS — O119 Pre-existing hypertension with pre-eclampsia, unspecified trimester: Principal | ICD-10-CM

## 2021-09-13 MED ORDER — PROCHLORPERAZINE MALEATE 10 MG TABLET
ORAL_TABLET | Freq: Four times a day (QID) | ORAL | 0 refills | 7.00000 days | Status: CP | PRN
Start: 2021-09-13 — End: 2021-09-13
  Filled 2021-09-22: qty 28, 7d supply, fill #0

## 2021-09-13 MED ORDER — HYDRALAZINE 50 MG TABLET
ORAL_TABLET | Freq: Four times a day (QID) | ORAL | 0 refills | 30 days
Start: 2021-09-13 — End: ?

## 2021-09-14 MED ORDER — HYDRALAZINE 50 MG TABLET
ORAL_TABLET | Freq: Four times a day (QID) | ORAL | 0 refills | 30.00000 days | Status: CP
Start: 2021-09-14 — End: 2021-10-14
  Filled 2021-09-22: qty 120, 30d supply, fill #0

## 2021-09-18 ENCOUNTER — Ambulatory Visit: Admit: 2021-09-18 | Discharge: 2021-09-19 | Payer: MEDICAID

## 2021-09-21 DIAGNOSIS — E669 Obesity, unspecified: Principal | ICD-10-CM

## 2021-09-28 ENCOUNTER — Telehealth: Payer: Self-pay | Admitting: Cardiology

## 2021-09-28 NOTE — Telephone Encounter (Signed)
Patient canceled appointment. Will call back to reschedule. OK per Dr. Bing Matter.

## 2021-09-28 NOTE — Telephone Encounter (Signed)
Patient is calling wanting to know if her appt tomorrow can be made virtual due to being in the hospital with her child that was recently born. Please advise.

## 2021-09-29 ENCOUNTER — Ambulatory Visit: Payer: Medicaid Other | Admitting: Cardiology

## 2021-10-01 ENCOUNTER — Ambulatory Visit: Admit: 2021-10-01 | Discharge: 2021-10-02 | Disposition: A | Discharge status: Home / Self Care | Payer: MEDICAID

## 2021-10-02 MED ORDER — CEPHALEXIN 500 MG CAPSULE
ORAL_CAPSULE | Freq: Three times a day (TID) | ORAL | 0 refills | 7 days | Status: CP
Start: 2021-10-02 — End: 2021-10-09

## 2021-10-04 ENCOUNTER — Emergency Department
Admit: 2021-10-04 | Discharge: 2021-10-05 | Disposition: A | Discharge status: Home / Self Care | Payer: MEDICAID | Attending: Emergency Medicine

## 2021-10-04 ENCOUNTER — Ambulatory Visit
Admit: 2021-10-04 | Discharge: 2021-10-05 | Disposition: A | Discharge status: Home / Self Care | Payer: MEDICAID | Attending: Emergency Medicine

## 2021-10-06 MED ORDER — SERTRALINE 50 MG TABLET
ORAL_TABLET | Freq: Every day | ORAL | 0 refills | 30 days
Start: 2021-10-06 — End: 2021-11-05

## 2021-10-07 MED ORDER — SERTRALINE 100 MG TABLET
ORAL_TABLET | Freq: Every day | ORAL | 3 refills | 30 days
Start: 2021-10-07 — End: ?

## 2021-10-08 MED ORDER — SERTRALINE 50 MG TABLET
ORAL_TABLET | Freq: Every day | ORAL | 0 refills | 30 days
Start: 2021-10-08 — End: 2021-11-07

## 2021-10-08 MED FILL — SERTRALINE 100 MG TABLET: ORAL | 30 days supply | Qty: 75 | Fill #0

## 2021-10-13 ENCOUNTER — Other Ambulatory Visit: Admit: 2021-10-13 | Discharge: 2021-10-14 | Payer: MEDICAID

## 2021-10-13 DIAGNOSIS — Z1389 Encounter for screening for other disorder: Principal | ICD-10-CM

## 2021-10-13 DIAGNOSIS — O34219 Maternal care for unspecified type scar from previous cesarean delivery: Principal | ICD-10-CM

## 2021-10-14 MED ORDER — METHYLPHENIDATE 10 MG TABLET
ORAL_TABLET | Freq: Two times a day (BID) | ORAL | 0 refills | 30 days
Start: 2021-10-14 — End: ?

## 2021-10-15 ENCOUNTER — Ambulatory Visit
Admit: 2021-10-15 | Discharge: 2021-10-16 | Payer: MEDICAID | Attending: Physician Assistant | Primary: Physician Assistant

## 2021-10-15 ENCOUNTER — Ambulatory Visit: Admit: 2021-10-15 | Discharge: 2021-10-16 | Payer: MEDICAID

## 2021-10-15 DIAGNOSIS — E669 Obesity, unspecified: Principal | ICD-10-CM

## 2021-10-15 DIAGNOSIS — Z0389 Encounter for observation for other suspected diseases and conditions ruled out: Principal | ICD-10-CM

## 2021-10-18 ENCOUNTER — Ambulatory Visit: Admit: 2021-10-18 | Discharge: 2021-10-18 | Disposition: A | Discharge status: Home / Self Care | Payer: MEDICAID

## 2021-10-18 ENCOUNTER — Emergency Department: Admit: 2021-10-18 | Discharge: 2021-10-18 | Disposition: A | Discharge status: Home / Self Care | Payer: MEDICAID

## 2021-10-19 MED FILL — METHYLPHENIDATE 10 MG TABLET: ORAL | 30 days supply | Qty: 60 | Fill #0

## 2021-10-20 MED ORDER — ACETAZOLAMIDE ER 500 MG CAPSULE,EXTENDED RELEASE
ORAL_CAPSULE | 3 refills | 0 days | Status: CP
Start: 2021-10-20 — End: ?

## 2021-10-27 ENCOUNTER — Ambulatory Visit: Admit: 2021-10-27 | Discharge: 2021-10-27 | Disposition: A | Discharge status: Home / Self Care | Payer: MEDICAID

## 2021-10-27 ENCOUNTER — Emergency Department: Admit: 2021-10-27 | Discharge: 2021-10-27 | Disposition: A | Discharge status: Home / Self Care | Payer: MEDICAID

## 2021-11-08 ENCOUNTER — Ambulatory Visit: Admit: 2021-11-08 | Discharge: 2021-11-09 | Disposition: A | Discharge status: Home / Self Care | Payer: MEDICAID

## 2021-11-08 DIAGNOSIS — R5383 Other fatigue: Principal | ICD-10-CM

## 2021-11-12 ENCOUNTER — Ambulatory Visit: Admit: 2021-11-12 | Discharge: 2021-11-12 | Disposition: A | Discharge status: Home / Self Care | Payer: MEDICAID

## 2021-11-12 MED ORDER — PROCHLORPERAZINE MALEATE 10 MG TABLET
ORAL_TABLET | Freq: Three times a day (TID) | ORAL | 0 refills | 4 days | Status: CP | PRN
Start: 2021-11-12 — End: 2021-11-19
  Filled 2021-11-13: qty 10, 4d supply, fill #0

## 2021-11-12 MED ORDER — DICYCLOMINE 20 MG TABLET
ORAL_TABLET | Freq: Two times a day (BID) | ORAL | 0 refills | 10 days | Status: CP | PRN
Start: 2021-11-12 — End: 2021-11-22
  Filled 2021-11-13: qty 20, 10d supply, fill #0

## 2021-11-17 MED ORDER — AMOXICILLIN 875 MG-POTASSIUM CLAVULANATE 125 MG TABLET
ORAL_TABLET | Freq: Two times a day (BID) | ORAL | 0 refills | 10 days
Start: 2021-11-17 — End: 2021-11-29

## 2021-11-17 MED ORDER — BENZONATATE 200 MG CAPSULE
ORAL_CAPSULE | Freq: Three times a day (TID) | ORAL | 0 refills | 10 days | PRN
Start: 2021-11-17 — End: ?

## 2021-11-17 MED ORDER — PROMETHAZINE-DM 6.25 MG-15 MG/5 ML ORAL SYRUP
ORAL | 0 refills | 0 days
Start: 2021-11-17 — End: ?

## 2021-11-19 MED FILL — AMOXICILLIN 875 MG-POTASSIUM CLAVULANATE 125 MG TABLET: ORAL | 10 days supply | Qty: 20 | Fill #0

## 2021-11-19 MED FILL — MONTELUKAST 10 MG TABLET: ORAL | 90 days supply | Qty: 90 | Fill #0

## 2021-11-19 MED FILL — SERTRALINE 100 MG TABLET: ORAL | 30 days supply | Qty: 60 | Fill #0

## 2021-11-19 MED FILL — LORATADINE 10 MG TABLET: ORAL | 30 days supply | Qty: 30 | Fill #0

## 2021-11-19 MED FILL — HYDRALAZINE 50 MG TABLET: ORAL | 30 days supply | Qty: 120 | Fill #0

## 2021-11-19 MED FILL — PRENATAL VITAMINS PLUS LOW IRON 27 MG IRON-1 MG TABLET: ORAL | 30 days supply | Qty: 30 | Fill #0

## 2021-11-19 MED FILL — BUPROPION HCL XL 150 MG 24 HR TABLET, EXTENDED RELEASE: ORAL | 30 days supply | Qty: 90 | Fill #0

## 2021-12-16 ENCOUNTER — Ambulatory Visit: Admit: 2021-12-16 | Discharge: 2021-12-17 | Payer: MEDICAID

## 2021-12-16 DIAGNOSIS — G932 Benign intracranial hypertension: Principal | ICD-10-CM

## 2021-12-16 MED ORDER — HYDRALAZINE 50 MG TABLET
ORAL_TABLET | Freq: Four times a day (QID) | ORAL | 0 refills | 30.00000 days
Start: 2021-12-16 — End: 2022-01-15

## 2021-12-16 MED ORDER — SERTRALINE 100 MG TABLET
ORAL_TABLET | Freq: Every day | ORAL | 0 refills | 30 days
Start: 2021-12-16 — End: 2022-04-15

## 2021-12-16 MED ORDER — LEVOTHYROXINE 50 MCG TABLET
ORAL_TABLET | Freq: Every day | ORAL | 0 refills | 30.00000 days | Status: CP
Start: 2021-12-16 — End: 2022-01-15

## 2021-12-16 MED ORDER — SERTRALINE 50 MG TABLET
ORAL_TABLET | Freq: Every day | ORAL | 0 refills | 30 days
Start: 2021-12-16 — End: 2022-01-15

## 2021-12-17 MED ORDER — SERTRALINE 50 MG TABLET
ORAL_TABLET | Freq: Every day | ORAL | 0 refills | 30 days
Start: 2021-12-17 — End: 2022-01-16

## 2021-12-17 MED ORDER — HYDRALAZINE 50 MG TABLET
ORAL_TABLET | Freq: Four times a day (QID) | ORAL | 0 refills | 30.00000 days | Status: CP
Start: 2021-12-17 — End: 2022-01-16

## 2022-01-20 ENCOUNTER — Ambulatory Visit: Payer: Medicaid Other | Attending: Cardiology | Admitting: Cardiology

## 2022-01-20 ENCOUNTER — Encounter: Payer: Self-pay | Admitting: Cardiology

## 2022-01-20 VITALS — BP 126/74 | HR 110 | Ht 63.0 in | Wt 369.4 lb

## 2022-01-20 DIAGNOSIS — I10 Essential (primary) hypertension: Secondary | ICD-10-CM

## 2022-01-20 DIAGNOSIS — E66813 Obesity, class 3: Secondary | ICD-10-CM

## 2022-01-20 DIAGNOSIS — G4733 Obstructive sleep apnea (adult) (pediatric): Secondary | ICD-10-CM

## 2022-01-20 DIAGNOSIS — K219 Gastro-esophageal reflux disease without esophagitis: Secondary | ICD-10-CM | POA: Diagnosis not present

## 2022-01-20 NOTE — Progress Notes (Signed)
Cardiology Office Note:    Date:  01/20/2022   ID:  Misty [name], DOB September 28, 1995, MRN [id number]  PCP:  [name], FNP  Cardiologist:  [name], MD    Referring MD: [name], FNP   Chief Complaint  Patient presents with   Follow-up    History of Present Illness:    Misty [name] is a 26 y.o. female  with past medical history significant for essential hypertension, bipolar disorder, morbid obesity, intracranial hypertension she did have shunt done in October,  Since have seen her last time she delivered a healthy girl she is busy taking care of her 2 children.  Denies have any chest pain tightness squeezing pressure burning chest, sometimes dizziness sometimes headache.  However it felt to be related to increased pressure and brain.  Being managed by neurology.  Past Medical History:  Diagnosis Date   Allergic rhinitis    Anxiety    Bipolar 1 disorder (HCC) 04/15/2017   Bipolar 1 disorder, depressed, severe (HCC) 12/16/2017   Bipolar I disorder (HCC) 10/10/2015   BMI 70 and over, adult (HCC) 12/03/2015   Borderline personality disorder (HCC) 03/14/2015   Depression    Fatty liver 10/10/2015   GAD (generalized anxiety disorder) 07/19/2012   Gastrointestinal disease 05/16/2013   GERD (gastroesophageal reflux disease)    H/O ASTHMA 10/08/2014   HTN (hypertension) 07/13/12   Hypertension 05/16/2013   IBS (irritable bowel syndrome)    Increased frequency of urination 04/12/2014   INTERMITTENT URTICARIA 10/08/2014   Intractable migraine without aura with status migrainosus 05/16/2013   Iron deficiency 10/10/2015   Iron deficiency anemia 12/11/2015   Lithium toxicity 04/05/2016   Major depressive disorder, recurrent, 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   MDD (major depressive disorder) 11/18/2018   MDD (major depressive disorder), recurrent episode, moderate (HCC) 03/01/2012   MDD (major depressive disorder), recurrent episode, severe (HCC) 03/15/2019   Metabolic syndrome  09/04/2012   Morbid obesity (HCC) 04/03/2015   Obesity    Obesity affecting pregnancy in second trimester 04/11/2020   Obstructive sleep apnea 07/19/2012   ODD (oppositional defiant disorder) 07/19/2012   OSA on CPAP    Palpitations 07/21/2015   Posttraumatic stress disorder 09/04/2014   Primary hypertension 07/13/2012   Formatting of this note might be different from the original. Cardiac ECHO 07/23/15: Technically difficult study due to habitus Left ventricular cavity is normal in size. Normal diastolic filling pattern. Calculated  EF 71% Left atrial cavitiy mildly dilated Trace TR   Psoriasis    Schizoaffective disorder, depressive type (HCC) 03/23/2017   Sinus tachycardia 08/07/2014   Supervision of high risk pregnancy in second trimester 05/25/2020   Last Assessment & Plan:  Formatting of this note might be different from the original.  Discussed age related risk for fetal aneuploidy  Reviewed previous noninvasive prenatal screening attempts (Panorama through [facility])- no results obtained due to low fetal fraction  Discussed various reasons for low fetal fraction including increased maternal BMI, early gestational age, maternal use of an   Swelling of both lower extremities 12/14/2016   Urge incontinence of urine 04/12/2014    Past Surgical History:  Procedure Laterality Date   ADENOIDECTOMY     SINOSCOPY     TONSILLECTOMY AND ADENOIDECTOMY  Age 36   Deviated septum corrected at same time    Current Medications: Current Meds  Medication Sig   acetaminophen (TYLENOL) 325 MG suppository Place 325 mg rectally every 8 (eight) hours as needed for mild  pain.   acetaZOLAMIDE ER (DIAMOX) 500 MG capsule Take 1,500 mg by mouth 2 (two) times daily.   aspirin EC 81 MG tablet Take 81 mg by mouth daily. Swallow whole.   buPROPion (WELLBUTRIN XL) 300 MG 24 hr tablet Take 450 mg by mouth every morning.   CIMZIA 2 X 200 MG/ML PSKT Inject 400 mg into the skin every 14 (fourteen) days.   FLOVENT HFA 110  MCG/ACT inhaler Inhale 2 puffs into the lungs 2 (two) times daily.   hydrALAZINE (APRESOLINE) 25 MG tablet Take 1 tablet (25 mg total) by mouth 4 (four) times daily. (Patient taking differently: Take 50 mg by mouth 4 (four) times daily.)   levothyroxine (SYNTHROID) 50 MCG tablet Take 1 tablet (50 mcg total) by mouth daily at 6 (six) AM. (Patient taking differently: Take 75 mcg by mouth daily at 6 (six) AM.)   montelukast (SINGULAIR) 10 MG tablet Take 1 tablet (10 mg total) by mouth at bedtime.   NIFEdipine (PROCARDIA XL) 60 MG 24 hr tablet Take 1 tablet (60 mg total) by mouth daily.   pantoprazole (PROTONIX) 40 MG tablet Take 40 mg by mouth daily.   sertraline (ZOLOFT) 100 MG tablet Take 200 mg by mouth daily.   VENTOLIN HFA 108 (90 Base) MCG/ACT inhaler Inhale 2 puffs into the lungs every 4 (four) hours as needed for wheezing or shortness of breath.   [DISCONTINUED] acetaZOLAMIDE (DIAMOX) 250 MG tablet Take 2,000 mg by mouth every 12 (twelve) hours.   [DISCONTINUED] cholecalciferol (VITAMIN D) 25 MCG (1000 UNIT) tablet Take 1,000 Units by mouth daily.   [DISCONTINUED] clopidogrel (PLAVIX) 75 MG tablet Take 75 mg by mouth in the morning.   [DISCONTINUED] Docusate Sodium (DSS) 100 MG CAPS Take 100 mg by mouth daily.   [DISCONTINUED] doxycycline (VIBRA-TABS) 100 MG tablet Take 100 mg by mouth 2 (two) times daily.   [DISCONTINUED] ferrous sulfate 325 (65 FE) MG tablet Take 325 mg by mouth every other day.   [DISCONTINUED] metoCLOPramide (REGLAN) 10 MG tablet Take 10 mg by mouth every 8 (eight) hours.   [DISCONTINUED] QUEtiapine (SEROQUEL) 25 MG tablet Take 12.5 mg by mouth 3 (three) times daily as needed for sleep.   [DISCONTINUED] riboflavin (VITAMIN B-2) 100 MG TABS tablet Take 100 mg by mouth daily.   [DISCONTINUED] Simethicone (GAS RELIEF 125 MAX ST PO) Take 1 tablet by mouth in the morning and at bedtime.   [DISCONTINUED] SUMAtriptan (IMITREX) 100 MG tablet Take 100 mg by mouth once as needed for  headache.     Allergies:   Other, Ultram [tramadol], Etanercept, Molds & smuts, Tramadol hcl, Ziprasidone, Ziprasidone hcl, and Adhesive [tape]   Social History   Socioeconomic History   Marital status: Significant Other    Spouse name: Not on file   Number of children: Not on file   Years of education: Not on file   Highest education level: Not on file  Occupational History   Occupation: STUDENT    Employer: [employer]    Comment: 11th grade [employer]  Tobacco Use   Smoking status: Never   Smokeless tobacco: Never  Substance and Sexual Activity   Alcohol use: Yes    Comment: socially   Drug use: No   Sexual activity: Not Currently  Other Topics Concern   Not on file  Social History Narrative   Not on file   Social Determinants of Health   Financial Resource Strain: Not on file  Food Insecurity: Not on file  Transportation Needs: Not on file  Physical Activity: Not on file  Stress: Not on file  Social Connections: Not on file     Family History: The patient's family history includes Allergic rhinitis in her mother; Bipolar disorder in her father; Depression in her maternal grandmother and mother; Diabetes in her maternal grandmother; Drug abuse in her father; Hypertension in her father and maternal grandmother. ROS:   Please see the history of present illness.    All 14 point review of systems negative except as described per history of present illness  EKGs/Labs/Other Studies Reviewed:      Recent Labs: 02/05/2021: ALT 24; BUN 18; Creatinine, Ser 0.50; Hemoglobin 14.6; Platelets 284; Potassium 3.6; Sodium 142  Recent Lipid Panel    Component Value Date/Time   CHOL 155 04/17/2017 0644   TRIG 135 04/17/2017 0644   HDL 46 04/17/2017 0644   CHOLHDL 3.4 04/17/2017 0644   VLDL 27 04/17/2017 0644   LDLCALC 82 04/17/2017 0644    Physical Exam:    VS:  BP 126/74 (BP Location: Left Arm, Patient Position: Sitting)   Pulse (!) 110   Ht 5\' 3"  (1.6 m)   Wt (!)  369 lb 6.4 oz (167.6 kg)   SpO2 98%   BMI 65.44 kg/m     Wt Readings from Last 3 Encounters:  01/20/22 (!) 369 lb 6.4 oz (167.6 kg)  06/25/21 (!) 380 lb 3.2 oz (172.5 kg)  02/05/21 (!) 396 lb 13.3 oz (180 kg)     GEN:  Well nourished, well developed in no acute distress HEENT: Normal NECK: No JVD; No carotid bruits LYMPHATICS: No lymphadenopathy CARDIAC: RRR, no murmurs, no rubs, no gallops RESPIRATORY:  Clear to auscultation without rales, wheezing or rhonchi  ABDOMEN: Soft, non-tender, non-distended MUSCULOSKELETAL:  No edema; No deformity  SKIN: Warm and dry LOWER EXTREMITIES: no swelling NEUROLOGIC:  Alert and oriented x 3 PSYCHIATRIC:  Normal affect   ASSESSMENT:    1. Primary hypertension   2. OSA on CPAP   3. Gastroesophageal reflux disease, unspecified whether esophagitis present   4. Class 3 obesity (HCC)    PLAN:    In order of problems listed above:  Essential hypertension blood pressure well-controlled with appropriate medication which I continue Obstructive sleep apnea to be managed by internal medicine team Gastroesophageal reflux disease stable Obesity noted still a problem   Medication Adjustments/Labs and Tests Ordered: Current medicines are reviewed at length with the patient today.  Concerns regarding medicines are outlined above.  No orders of the defined types were placed in this encounter.  Medication changes: No orders of the defined types were placed in this encounter.   Signed, 02/07/21, MD, [facility] 01/20/2022 1:58 PM    [facility]

## 2022-01-20 NOTE — Patient Instructions (Signed)

## 2022-02-17 MED ORDER — HYDRALAZINE 50 MG TABLET
ORAL_TABLET | 0 refills | 0 days
Start: 2022-02-17 — End: ?

## 2022-02-18 MED ORDER — HYDRALAZINE 50 MG TABLET
ORAL_TABLET | 0 refills | 0 days
Start: 2022-02-18 — End: ?

## 2022-03-02 ENCOUNTER — Ambulatory Visit: Admit: 2022-03-02 | Discharge: 2022-03-03 | Payer: MEDICAID

## 2022-03-02 DIAGNOSIS — G932 Benign intracranial hypertension: Principal | ICD-10-CM

## 2022-03-05 ENCOUNTER — Ambulatory Visit
Admit: 2022-03-05 | Discharge: 2022-03-06 | Payer: MEDICAID | Attending: Obstetrics & Gynecology | Primary: Obstetrics & Gynecology

## 2022-03-05 DIAGNOSIS — Z3046 Encounter for surveillance of implantable subdermal contraceptive: Principal | ICD-10-CM

## 2022-03-10 ENCOUNTER — Ambulatory Visit: Admit: 2022-03-10 | Discharge: 2022-03-11 | Disposition: A | Discharge status: Home / Self Care | Payer: MEDICAID

## 2022-03-10 ENCOUNTER — Emergency Department: Admit: 2022-03-10 | Discharge: 2022-03-11 | Disposition: A | Discharge status: Home / Self Care | Payer: MEDICAID

## 2022-03-22 ENCOUNTER — Telehealth: Payer: Self-pay | Admitting: Cardiology

## 2022-03-22 NOTE — Telephone Encounter (Signed)
Pt has an appointment with PA at [facility] on 03/26/22 as Dr. Agustin Cree does not have any openings. Pt advised to go to the ED for sx. Pt verbalized understanding and had no additional questions.  Symptoms include mid sternal pressure/tightness that increases with movement. Denies any associated sx.  Pt verbalized understanding and had no additional questions.

## 2022-03-22 NOTE — Telephone Encounter (Signed)
Pt c/o of Chest Pain: STAT if CP now or developed within 24 hours  1. Are you having CP right now? Hurting and tightness in chest- having it right now  2. Are you experiencing any other symptoms (ex. SOB, nausea, vomiting, sweating)? Shortness of breath at this time  3. How long have you been experiencing CP? About 2 weeks  4. Is your CP continuous or coming and going? stays  5. Have you taken Nitroglycerin?  no ?

## 2022-03-24 NOTE — Progress Notes (Deleted)
Cardiology Clinic Note   Patient Name: Misty [name] Date of Encounter: 03/24/2022  Primary Care Provider:  [name] Hickman, FNP Primary Cardiologist:  None  Patient Profile    27 y.o. female  with past medical history significant for essential hypertension,OSA, GERD, bipolar disorder, morbid obesity, intracranial hypertension with shunt done in October 2023. Last seen in the office by Dr. Agustin Cree on 01/20/2022.   Past Medical History    Past Medical History:  Diagnosis Date   Allergic rhinitis    Anxiety    Bipolar 1 disorder ([place]) 04/15/2017   Bipolar 1 disorder, depressed, severe ([place]) 12/16/2017   Bipolar I disorder ([place]) 10/10/2015   BMI 70 and over, adult ([place]) 12/03/2015   Borderline personality disorder ([place]) 03/14/2015   Depression    Fatty liver 10/10/2015   GAD (generalized anxiety disorder) 07/19/2012   Gastrointestinal disease 05/16/2013   GERD (gastroesophageal reflux disease)    H/O ASTHMA 10/08/2014   HTN (hypertension) 07/13/12   Hypertension 05/16/2013   IBS (irritable bowel syndrome)    Increased frequency of urination 04/12/2014   INTERMITTENT URTICARIA 10/08/2014   Intractable migraine without aura with status migrainosus 05/16/2013   Iron deficiency 10/10/2015   Iron deficiency anemia 12/11/2015   Lithium toxicity 04/05/2016   Major depressive disorder, recurrent, 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[place]) 03/23/2017   MDD (major depressive disorder) 11/18/2018   MDD (major depressive disorder), recurrent episode, moderate ([place]) 03/01/2012   MDD (major depressive disorder), recurrent episode, severe ([place]) 123XX123   Metabolic syndrome 99991111   Morbid obesity ([place]) 04/03/2015   Obesity    Obesity affecting pregnancy in second trimester 04/11/2020   Obstructive sleep apnea 07/19/2012   ODD (oppositional defiant disorder) 07/19/2012   OSA on CPAP    Palpitations 07/21/2015   Posttraumatic stress disorder 09/04/2014   Primary hypertension 07/13/2012   Formatting of this note might be  different from the original. Cardiac ECHO 07/23/15: Technically difficult study due to habitus Left ventricular cavity is normal in size. Normal diastolic filling pattern. Calculated  EF 71% Left atrial cavitiy mildly dilated Trace TR   Psoriasis    Schizoaffective disorder, depressive type ([place]) 03/23/2017   Sinus tachycardia 08/07/2014   Supervision of high risk pregnancy in second trimester 05/25/2020   Last Assessment & Plan:  Formatting of this note might be different from the original.  Discussed age related risk for fetal aneuploidy  Reviewed previous noninvasive prenatal screening attempts (Panor[facility] through [facility])- no results obtained due to low fetal fraction  Discussed various reasons for low fetal fraction including increased maternal BMI, early gestational age, maternal use of an   Swelling of both lower extremities 12/14/2016   Urge incontinence of urine 04/12/2014   Past Surgical History:  Procedure Laterality Date   ADENOIDECTOMY     SINOSCOPY     TONSILLECTOMY AND ADENOIDECTOMY  Age 60   Deviated septum corrected at same time    Allergies  Allergies  Allergen Reactions   Other Rash and Other (See Comments)    Other reaction(s): Other (See Comments) Other reaction(s): Rash Must wear at least sterling silver jewelry Other reaction(s): Rash Must wear at least sterling silver jewelry Other reaction(s): Rash Must wear at least sterling silver jewelry Other reaction(s): Other (See Comments) Other reaction(s): Rash Must wear at least sterling silver jewelry   Ultram [Tramadol] Rash    Pt reports itching as well   Etanercept     Unsure re reaction   Molds & Smuts  Tramadol Hcl Hives   Ziprasidone    Ziprasidone Hcl     Unsure of reaction   Adhesive [Tape] Rash    History of Present Illness    Misty [name] comes today for ongoing assessment and management of HTN, with other history as outlined above. She called our office on 03/22/2022 with complaints of chest  pain, described as hurting  and tightness, with shortness of breath. She had been experiencing these symptoms for two weeks. The pain is persistent and not intermittent.   Home Medications    Current Outpatient Medications  Medication Sig Dispense Refill   acetaminophen (TYLENOL) 325 MG suppository Place 325 mg rectally every 8 (eight) hours as needed for mild pain.     acetaZOLAMIDE ER (DIAMOX) 500 MG capsule Take 1,500 mg by mouth 2 (two) times daily.     aspirin EC 81 MG tablet Take 81 mg by mouth daily. Swallow whole.     buPROPion (WELLBUTRIN XL) 300 MG 24 hr tablet Take 450 mg by mouth every morning.     CIMZIA 2 X 200 MG/ML PSKT Inject 400 mg into the skin every 14 (fourteen) days.     FLOVENT HFA 110 MCG/ACT inhaler Inhale 2 puffs into the lungs 2 (two) times daily.     hydrALAZINE (APRESOLINE) 25 MG tablet Take 1 tablet (25 mg total) by mouth 4 (four) times daily. (Patient taking differently: Take 50 mg by mouth 4 (four) times daily.) 360 tablet 2   levothyroxine (SYNTHROID) 50 MCG tablet Take 1 tablet (50 mcg total) by mouth daily at 6 (six) AM. (Patient taking differently: Take 75 mcg by mouth daily at 6 (six) AM.) 30 tablet 1   montelukast (SINGULAIR) 10 MG tablet Take 1 tablet (10 mg total) by mouth at bedtime. 30 tablet 1   NIFEdipine (PROCARDIA XL) 60 MG 24 hr tablet Take 1 tablet (60 mg total) by mouth daily. 30 tablet 5   pantoprazole (PROTONIX) 40 MG tablet Take 40 mg by mouth daily.     sertraline (ZOLOFT) 100 MG tablet Take 200 mg by mouth daily.     VENTOLIN HFA 108 (90 Base) MCG/ACT inhaler Inhale 2 puffs into the lungs every 4 (four) hours as needed for wheezing or shortness of breath.     No current facility-administered medications for this visit.     Family History    Family History  Problem Relation Age of Onset   Depression Mother    Allergic rhinitis Mother    Depression Maternal Grandmother    Diabetes Maternal Grandmother    Hypertension Maternal  Grandmother    Bipolar disorder Father    Drug abuse Father    Hypertension Father    She indicated that the status of her mother is unknown. She indicated that the status of her father is unknown. She indicated that the status of her maternal grandmother is unknown.  Social History    Social History   Socioeconomic History   Marital status: Significant Other    Spouse name: Not on file   Number of children: Not on file   Years of education: Not on file   Highest education level: Not on file  Occupational History   Occupation: STUDENT    Employer: [employer]    Comment: 11th grade [employer]  Tobacco Use   Smoking status: Never   Smokeless tobacco: Never  Substance and Sexual Activity   Alcohol use: Yes    Comment: socially   Drug use: No  Sexual activity: Not Currently  Other Topics Concern   Not on file  Social History Narrative   Not on file   Social Determinants of Health   Financial Resource Strain: Not on file  Food Insecurity: Not on file  Transportation Needs: Not on file  Physical Activity: Not on file  Stress: Not on file  Social Connections: Not on file  Intimate Partner Violence: Not on file     Review of Systems    General:  No chills, fever, night sweats or weight changes.  Cardiovascular:  No chest pain, dyspnea on exertion, edema, orthopnea, palpitations, paroxysmal nocturnal dyspnea. Dermatological: No rash, lesions/masses Respiratory: No cough, dyspnea Urologic: No hematuria, dysuria Abdominal:   No nausea, vomiting, diarrhea, bright red blood per rectum, melena, or hematemesis Neurologic:  No visual changes, wkns, changes in mental status. All other systems reviewed and are otherwise negative except as noted above.     Physical Exam    VS:  There were no vitals taken for this visit. , BMI There is no height or weight on file to calculate BMI.     GEN: Well nourished, well developed, in no acute distress. HEENT: normal. Neck: Supple,  no JVD, carotid bruits, or masses. Cardiac: RRR, no murmurs, rubs, or gallops. No clubbing, cyanosis, edema.  Radials/DP/PT 2+ and equal bilaterally.  Respiratory:  Respirations regular and unlabored, clear to auscultation bilaterally. GI: Soft, nontender, nondistended, BS + x 4. MS: no deformity or atrophy. Skin: warm and dry, no rash. Neuro:  Strength and sensation are intact. Psych: Normal affect.  Accessory Clinical Findings    ECG personally reviewed by me today- *** - No acute changes  Lab Results  Component Value Date   WBC 14.4 (H) 02/05/2021   HGB 14.6 02/05/2021   HCT 43.0 02/05/2021   MCV 85.9 02/05/2021   PLT 284 02/05/2021   Lab Results  Component Value Date   CREATININE 0.50 02/05/2021   BUN 18 02/05/2021   NA 142 02/05/2021   K 3.6 02/05/2021   CL 112 (H) 02/05/2021   CO2 18 (L) 02/05/2021   Lab Results  Component Value Date   ALT 24 02/05/2021   AST 18 02/05/2021   ALKPHOS 121 02/05/2021   BILITOT 0.4 02/05/2021   Lab Results  Component Value Date   CHOL 155 04/17/2017   HDL 46 04/17/2017   LDLCALC 82 04/17/2017   TRIG 135 04/17/2017   CHOLHDL 3.4 04/17/2017    Lab Results  Component Value Date   HGBA1C 5.1 04/17/2017    Review of Prior Studies: Echocardiogram 01/29/2021 1. GLS -14. Left ventricular ejection fraction, by estimation, is 55 to  60%. The left ventricle has normal function. The left ventricle has no  regional wall motion abnormalities. There is mild left ventricular  hypertrophy. Left ventricular diastolic  parameters were normal.   2. Right ventricular systolic function is normal. The right ventricular  size is normal. There is normal pulmonary artery systolic pressure.   3. The mitral valve is normal in structure. No evidence of mitral valve  regurgitation. No evidence of mitral stenosis.   4. The aortic valve was not well visualized. Aortic valve regurgitation  is not visualized. No aortic stenosis is present.   5. The  inferior vena cava is normal in size with greater than 50%  respiratory variability, suggesting right atrial pressure of 3 mmHg.    Assessment & Plan   1.  ***    Current medicines are reviewed at  length with the patient today.  I have spent *** min's  dedicated to the care of this patient on the date of this encounter to include pre-visit review of records, assessment, management and diagnostic testing,with shared decision making. Signed, Phill Myron. West Pugh, ANP, [place]   03/24/2022 4:51 PM      Office [phone] Fax [phone]  Notice: This dictation was prepared with Dragon dictation along with smaller phrase technology. Any transcriptional errors that result from this process are unintentional and may not be corrected upon review.

## 2022-03-26 ENCOUNTER — Ambulatory Visit: Payer: Medicaid Other | Admitting: Adult Health

## 2022-04-14 ENCOUNTER — Encounter: Payer: Self-pay | Admitting: Cardiology

## 2022-04-14 ENCOUNTER — Ambulatory Visit: Payer: Medicaid Other | Attending: Adult Health | Admitting: Cardiology

## 2022-04-14 VITALS — BP 138/83 | HR 74 | Ht 63.0 in | Wt 388.0 lb

## 2022-04-14 DIAGNOSIS — I10 Essential (primary) hypertension: Secondary | ICD-10-CM | POA: Diagnosis present

## 2022-04-14 DIAGNOSIS — R0609 Other forms of dyspnea: Secondary | ICD-10-CM | POA: Diagnosis present

## 2022-04-14 DIAGNOSIS — G4733 Obstructive sleep apnea (adult) (pediatric): Secondary | ICD-10-CM | POA: Diagnosis present

## 2022-04-14 DIAGNOSIS — F314 Bipolar disorder, current episode depressed, severe, without psychotic features: Secondary | ICD-10-CM | POA: Diagnosis present

## 2022-04-14 NOTE — Progress Notes (Signed)
Cardiology Office Note:    Date:  04/14/2022   ID:  Martin Majestic Dimaria, DOB [birth year], MRN [id number]  PCP:  [name] Hickman, FNP  Cardiologist:  Jenne Campus, MD    Referring MD: [name] Hickman, FNP   No chief complaint on file. I was in the emergency room  History of Present Illness:    Misty [name] is a 27 y.o. female past medical history significant for essential hypertension, bipolar disorder, morbid obesity, intracranial hypertension comes today to my office after seeing in the emergency room.  She went there with atypical chest pain all test were negative.  Overall seems to be doing well blood pressure well-controlled she does have 2 kids she is busy taking care of them.  Past Medical History:  Diagnosis Date   Allergic rhinitis    Anxiety    Bipolar 1 disorder ([place]) 04/15/2017   Bipolar 1 disorder, depressed, severe ([place]) 12/16/2017   Bipolar I disorder ([place]) 10/10/2015   BMI 70 and over, adult ([place]) 12/03/2015   Borderline personality disorder ([place]) 03/14/2015   Depression    Fatty liver 10/10/2015   GAD (generalized anxiety disorder) 07/19/2012   Gastrointestinal disease 05/16/2013   GERD (gastroesophageal reflux disease)    H/O ASTHMA 10/08/2014   HTN (hypertension) 07/13/12   Hypertension 05/16/2013   IBS (irritable bowel syndrome)    Increased frequency of urination 04/12/2014   INTERMITTENT URTICARIA 10/08/2014   Intractable migraine without aura with status migrainosus 05/16/2013   Iron deficiency 10/10/2015   Iron deficiency anemia 12/11/2015   Lithium toxicity 04/05/2016   Major depressive disorder, recurrent, 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[place]) 03/23/2017   MDD (major depressive disorder) 11/18/2018   MDD (major depressive disorder), recurrent episode, moderate ([place]) 03/01/2012   MDD (major depressive disorder), recurrent episode, severe ([place]) 123XX123   Metabolic syndrome 99991111   Morbid obesity ([place]) 04/03/2015   Obesity    Obesity affecting pregnancy in second trimester  04/11/2020   Obstructive sleep apnea 07/19/2012   ODD (oppositional defiant disorder) 07/19/2012   OSA on CPAP    Palpitations 07/21/2015   Posttraumatic stress disorder 09/04/2014   Primary hypertension 07/13/2012   Formatting of this note might be different from the original. Cardiac ECHO 07/23/15: Technically difficult study due to habitus Left ventricular cavity is normal in size. Normal diastolic filling pattern. Calculated  EF 71% Left atrial cavitiy mildly dilated Trace TR   Psoriasis    Schizoaffective disorder, depressive type ([place]) 03/23/2017   Sinus tachycardia 08/07/2014   Supervision of high risk pregnancy in second trimester 05/25/2020   Last Assessment & Plan:  Formatting of this note might be different from the original.  Discussed age related risk for fetal aneuploidy  Reviewed previous noninvasive prenatal screening attempts ([facility] through [facility])- no results obtained due to low fetal fraction  Discussed various reasons for low fetal fraction including increased maternal BMI, early gestational age, maternal use of an   Swelling of both lower extremities 12/14/2016   Urge incontinence of urine 04/12/2014    Past Surgical History:  Procedure Laterality Date   ADENOIDECTOMY     SINOSCOPY     TONSILLECTOMY AND ADENOIDECTOMY  Age 21   Deviated septum corrected at same time    Current Medications: Current Meds  Medication Sig   acetaminophen (TYLENOL) 325 MG suppository Place 325 mg rectally every 8 (eight) hours as needed for mild pain.   acetaZOLAMIDE ER (DIAMOX) 500 MG capsule Take 1,500 mg by mouth 2 (two) times  daily.   aspirin EC 81 MG tablet Take 81 mg by mouth daily. Swallow whole.   buPROPion (WELLBUTRIN XL) 300 MG 24 hr tablet Take 450 mg by mouth every morning.   CIMZIA 2 X 200 MG/ML PSKT Inject 400 mg into the skin every 14 (fourteen) days.   FLOVENT HFA 110 MCG/ACT inhaler Inhale 2 puffs into the lungs 2 (two) times daily.   hydrALAZINE (APRESOLINE) 25 MG tablet  Take 1 tablet (25 mg total) by mouth 4 (four) times daily. (Patient taking differently: Take 50 mg by mouth 4 (four) times daily.)   levothyroxine (SYNTHROID) 50 MCG tablet Take 1 tablet (50 mcg total) by mouth daily at 6 (six) AM. (Patient taking differently: Take 75 mcg by mouth daily at 6 (six) AM.)   montelukast (SINGULAIR) 10 MG tablet Take 1 tablet (10 mg total) by mouth at bedtime.   NIFEdipine (PROCARDIA XL) 60 MG 24 hr tablet Take 1 tablet (60 mg total) by mouth daily.   pantoprazole (PROTONIX) 40 MG tablet Take 40 mg by mouth daily.   sertraline (ZOLOFT) 100 MG tablet Take 200 mg by mouth daily.   VENTOLIN HFA 108 (90 Base) MCG/ACT inhaler Inhale 2 puffs into the lungs every 4 (four) hours as needed for wheezing or shortness of breath.     Allergies:   Etanercept, Latex, Molds & smuts, Tape, Tramadol, Other, Ziprasidone hcl, Tramadol hcl, and Ziprasidone   Social History   Socioeconomic History   Marital status: Significant Other    Spouse name: Not on file   Number of children: Not on file   Years of education: Not on file   Highest education level: Not on file  Occupational History   Occupation: STUDENT    Employer: [employer]    Comment: 11th grade [employer]  Tobacco Use   Smoking status: Never   Smokeless tobacco: Never  Substance and Sexual Activity   Alcohol use: Yes    Comment: socially   Drug use: No   Sexual activity: Not Currently  Other Topics Concern   Not on file  Social History Narrative   Not on file   Social Determinants of Health   Financial Resource Strain: Not on file  Food Insecurity: Not on file  Transportation Needs: Not on file  Physical Activity: Not on file  Stress: Not on file  Social Connections: Not on file     Family History: The patient's family history includes Allergic rhinitis in her mother; Bipolar disorder in her father; Depression in her maternal grandmother and mother; Diabetes in her maternal grandmother; Drug abuse in her  father; Hypertension in her father and maternal grandmother. ROS:   Please see the history of present illness.    All 14 point review of systems negative except as described per history of present illness  EKGs/Labs/Other Studies Reviewed:      Recent Labs: No results found for requested labs within last 365 days.  Recent Lipid Panel    Component Value Date/Time   CHOL 155 04/17/2017 0644   TRIG 135 04/17/2017 0644   HDL 46 04/17/2017 0644   CHOLHDL 3.4 04/17/2017 0644   VLDL 27 04/17/2017 0644   LDLCALC 82 04/17/2017 0644    Physical Exam:    VS:  BP 138/83 (BP Location: Right Arm, Patient Position: Sitting, Cuff Size: Large)   Pulse 74   Ht '5\' 3"'$  (1.6 m)   Wt (!) 388 lb (176 kg)   SpO2 99%   BMI 68.73  kg/m     Wt Readings from Last 3 Encounters:  04/14/22 (!) 388 lb (176 kg)  01/20/22 (!) 369 lb 6.4 oz (167.6 kg)  06/25/21 (!) 380 lb 3.2 oz (172.5 kg)     GEN:  Well nourished, well developed in no acute distress HEENT: Normal NECK: No JVD; No carotid bruits LYMPHATICS: No lymphadenopathy CARDIAC: RRR, no murmurs, no rubs, no gallops RESPIRATORY:  Clear to auscultation without rales, wheezing or rhonchi  ABDOMEN: Soft, non-tender, non-distended MUSCULOSKELETAL:  No edema; No deformity  SKIN: Warm and dry LOWER EXTREMITIES: no swelling NEUROLOGIC:  Alert and oriented x 3 PSYCHIATRIC:  Normal affect   ASSESSMENT:    1. Primary hypertension   2. OSA on CPAP   3. Bipolar 1 disorder, depressed, severe ([place])    PLAN:    In order of problems listed above:  Essential hypertension blood pressure seems to well-controlled we will continue present management. Obstructive sleep apnea she is on CPAP mask which I will continue. She was in the emergency room chest x-ray showed cardiomegaly I will schedule her to have echocardiogram repeated Bipolar disorder stable followed by internal medicine and psychology. I did review record from the emergency room for this  visit   Medication Adjustments/Labs and Tests Ordered: Current medicines are reviewed at length with the patient today.  Concerns regarding medicines are outlined above.  No orders of the defined types were placed in this encounter.  Medication changes: No orders of the defined types were placed in this encounter.   Signed, Park Liter, MD, [facility] 04/14/2022 10:12 AM    [place]

## 2022-04-14 NOTE — Addendum Note (Signed)
Addended by: Jacobo Forest D on: 04/14/2022 10:37 AM   Modules accepted: Orders

## 2022-04-14 NOTE — Patient Instructions (Addendum)
Medication Instructions:  Your physician recommends that you continue on your current medications as directed. Please refer to the Current Medication list given to you today.  *If you need a refill on your cardiac medications before your next appointment, please call your pharmacy*   Lab Work: None Ordered If you have labs (blood work) drawn today and your tests are completely normal, you will receive your results only by: [place] (if you have MyChart) OR A paper copy in the mail If you have any lab test that is abnormal or we need to change your treatment, we will call you to review the results.   Testing/Procedures: Your physician has requested that you have an echocardiogram. Echocardiography is a painless test that uses sound waves to create images of your heart. It provides your doctor with information about the size and shape of your heart and how well your heart's chambers and valves are working. This procedure takes approximately one hour. There are no restrictions for this procedure. Please do NOT wear cologne, perfume, aftershave, or lotions (deodorant is allowed). Please arrive 15 minutes prior to your appointment time.    Follow-Up: At [facility], you and your health needs are our priority.  As part of our continuing mission to provide you with exceptional heart care, we have created designated Provider Care Teams.  These Care Teams include your primary Cardiologist (physician) and Advanced Practice Providers (APPs -  Physician Assistants and Nurse Practitioners) who all work together to provide you with the care you need, when you need it.  We recommend signing up for the patient portal called "MyChart".  Sign up information is provided on this After Visit Summary.  MyChart is used to connect with patients for Virtual Visits (Telemedicine).  Patients are able to view lab/test results, encounter notes, upcoming appointments, etc.  Non-urgent messages can be sent to your  provider as well.   To learn more about what you can do with MyChart, go to [email or web address].    Your next appointment:   12 month(s)  The format for your next appointment:   In Person  Provider:   Jenne Campus, MD    Other Instructions NA

## 2022-04-21 ENCOUNTER — Telehealth: Payer: Self-pay | Admitting: Cardiology

## 2022-04-21 NOTE — Telephone Encounter (Signed)
Pt would like a callback in regards to upcoming echo appt and surgery she said she's having on 3/13. Please advise.

## 2022-04-21 NOTE — Telephone Encounter (Signed)
Spoke with patient, I don't see a clearance request on file. I explained our surgical clearance protocol and she verbalize understanding. I also made her aware she has soon appt for the Echo.   Patient is aware of our contact information to give to her surgeon for the clearance information.

## 2022-04-26 ENCOUNTER — Ambulatory Visit: Payer: Medicaid Other | Attending: Cardiology

## 2022-04-26 ENCOUNTER — Other Ambulatory Visit: Payer: Self-pay

## 2022-04-26 ENCOUNTER — Telehealth: Payer: Self-pay | Admitting: Cardiology

## 2022-04-26 DIAGNOSIS — R0609 Other forms of dyspnea: Secondary | ICD-10-CM | POA: Insufficient documentation

## 2022-04-26 LAB — ECHOCARDIOGRAM COMPLETE: S' Lateral: 3.7 cm

## 2022-04-26 MED ORDER — NIFEDIPINE ER OSMOTIC RELEASE 60 MG PO TB24: 60.0000 mg | ORAL_TABLET | Freq: Every day | ORAL | 3 refills | Status: AC

## 2022-04-26 MED ORDER — HYDRALAZINE HCL 25 MG PO TABS: 50.0000 mg | ORAL_TABLET | Freq: Four times a day (QID) | ORAL | 3 refills | Status: DC

## 2022-04-26 NOTE — Telephone Encounter (Signed)
Patient came by office today for echo- states she needs refills on her "heart meds" sent to [facility]

## 2022-04-27 ENCOUNTER — Telehealth: Payer: Self-pay | Admitting: Cardiology

## 2022-04-27 NOTE — Telephone Encounter (Addendum)
   [facility] Pre-operative Risk Assessment    Request for surgical clearance:  What type of surgery is being performed? Ventricular Atrial Shunt   When is this surgery scheduled? TBD   What type of clearance is required (medical clearance vs. Pharmacy clearance to hold med vs. Both)? Both  Are there any medications that need to be held prior to surgery and how long?Not specified   Practice name and name of physician performing surgery? Dr. Caryl Comes at [name]   What is your office phone number: [phone]    7.   What is your office fax number: [phone] Misty [name]  8.   Anesthesia type (None, local, MAC, general) ? Not specified    Misty [name] Misty [name] 04/27/2022, 4:44 PM  _________________________________________________________________   (provider comments below)

## 2022-04-27 NOTE — Telephone Encounter (Signed)
Patient states clearance request was sent to the office this morning from [facility]. She would like to confirm received and discuss any updates.

## 2022-04-27 NOTE — Telephone Encounter (Signed)
Dr. Agustin Cree,  [place] saw this patient on 04/14/2022.  Her echo was completed on 04/26/2022.  Will you please comment on clearance for ventricular atrial shunt being performed by [facility]?  Please route your response to P CV DIV Preop. I will communicate with requesting office once you have given recommendations.   Thank you!  Mayra Reel, NP

## 2022-04-27 NOTE — Telephone Encounter (Signed)
Preoperative team,  I do not see a request in the system.  Will you please look into this?  Thank you!  DW

## 2022-04-27 NOTE — Telephone Encounter (Addendum)
I s/w the pt and stated that I did not see a clearance in the chart yet. She tells me that she had surgeon fax request to either the [place] or [facility] office. I asked her to just have the surgeon's office fax the clearance request to my fax # [phone]. Pt agreeable to plan of care.    While I was entering my note, I saw that the clearance request was just put as I was talking to the pt and putting in my notes. I then called the pt back and stated to never mind having the surgeon fax clearance request as it is in the chart now. Pt said surgeon office was just faxing it over to me. I said that is fine.   I will update the pre op APP.

## 2022-04-28 NOTE — Telephone Encounter (Signed)
   Name: Guylene Jentzsch  DOB: Jul 04, 1995  MRN: [id number]   Primary Cardiologist: None  Chart reviewed as part of pre-operative protocol coverage. [name] was last seen on 04/14/2022 by Dr. Agustin Cree.  Dr. Agustin Cree "there are no cardiac contraindications for the procedure she should be at a low risk from a cardiac standpoint."  Therefore, based on ACC/AHA guidelines, the patient would be at acceptable risk for the planned procedure without further cardiovascular testing.   Ideally aspirin should be continued without interruption, however if the bleeding risk is too great, aspirin may be held for 5-7 days prior to surgery. Please resume aspirin post operatively when it is felt to be safe from a bleeding standpoint.    I will route this recommendation to the requesting party via Epic fax function and remove from pre-op pool. Please call with questions.  Mayra Reel, NP 04/28/2022, 1:44 PM

## 2022-04-30 ENCOUNTER — Telehealth: Payer: Self-pay

## 2022-04-30 NOTE — Telephone Encounter (Signed)
Pt viewed results in My Chart. Routed to PCP.  

## 2022-05-05 ENCOUNTER — Ambulatory Visit: Admit: 2022-05-05 | Discharge: 2022-05-06 | Payer: MEDICAID

## 2022-05-05 DIAGNOSIS — Z8489 Family history of other specified conditions: Principal | ICD-10-CM

## 2022-05-20 ENCOUNTER — Ambulatory Visit: Payer: Medicaid Other | Admitting: Cardiology

## 2022-05-24 DIAGNOSIS — R519 Headache, unspecified: Secondary | ICD-10-CM | POA: Insufficient documentation

## 2022-07-18 ENCOUNTER — Emergency Department
Admit: 2022-07-18 | Discharge: 2022-07-18 | Discharge status: Against Medical Advice | Payer: MEDICAID | Attending: Family Medicine

## 2022-07-18 ENCOUNTER — Ambulatory Visit
Admit: 2022-07-18 | Discharge: 2022-07-18 | Discharge status: Against Medical Advice | Payer: MEDICAID | Attending: Family Medicine

## 2022-09-07 ENCOUNTER — Telehealth: Payer: Self-pay

## 2022-09-07 DIAGNOSIS — K801 Calculus of gallbladder with chronic 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: Secondary | ICD-10-CM | POA: Insufficient documentation

## 2022-09-07 NOTE — Telephone Encounter (Signed)
   Name: Misty [name]  DOB: [birth year]  MRN: [id number]  Primary Cardiologist: None   Preoperative team, please contact this patient and set up a phone call appointment for further preoperative risk assessment. Please obtain consent and complete medication review. Thank you for your help.  I confirm that guidance regarding antiplatelet and oral anticoagulation therapy has been completed and, if necessary, noted below.  Regarding ASA therapy it may be stopped 5-7 days prior to surgery with a plan to resume it as soon as felt to be feasible from a surgical standpoint in the post-operative period.    Napoleon Form, Leodis Rains, NP 09/07/2022, 3:58 PM [facility]

## 2022-09-07 NOTE — Telephone Encounter (Signed)
Pt last OV was 04/14/22 with Dr. Bing Matter.     Pre-operative Risk Assessment    Patient Name: Misty [name]  DOB: March 11, 1995 MRN: [id number]      Request for Surgical Clearance    Procedure:   Cholecystectomy  Date of Surgery:  Clearance TBD                                 Surgeon:  dr. Terrilee Files Surgeon's Group or Practice Name:  [facility] Phone number:  [phone] Fax number:  [phone]   Type of Clearance Requested:   - Medical  - Pharmacy:  Hold Aspirin pt will need instructions on when/if to hold   Type of Anesthesia:  General    Additional requests/questions:    SignedZada Finders   09/07/2022, 3:31 PM

## 2022-09-07 NOTE — Telephone Encounter (Signed)
1st attempt at calling pt to schedule tele for preop. lvmtrc

## 2022-09-09 ENCOUNTER — Telehealth: Payer: Self-pay | Admitting: *Deleted

## 2022-09-09 NOTE — Telephone Encounter (Signed)
Pt has been scheduled for tele pre op appt 09/22/22 @ 10:20. Med rec and consent are done. Pt said she is taking a pre natal vitamin now but did not know the name of it and she was taking care of her baby right now. I said that is fine.     Patient Consent for Virtual Visit        Misty [name] has provided verbal consent on 09/09/2022 for a virtual visit (video or telephone).   CONSENT FOR VIRTUAL VISIT FOR:  Misty [name]  By participating in this virtual visit I agree to the following:  I hereby voluntarily request, consent and authorize [facility] and its employed or contracted physicians, physician assistants, nurse practitioners or other licensed health care professionals (the Practitioner), to provide me with telemedicine health care services (the "Services") as deemed necessary by the treating Practitioner. I acknowledge and consent to receive the Services by the Practitioner via telemedicine. I understand that the telemedicine visit will involve communicating with the Practitioner through live audiovisual communication technology and the disclosure of certain medical information by electronic transmission. I acknowledge that I have been given the opportunity to request an in-person assessment or other available alternative prior to the telemedicine visit and am voluntarily participating in the telemedicine visit.  I understand that I have the right to withhold or withdraw my consent to the use of telemedicine in the course of my care at any time, without affecting my right to future care or treatment, and that the Practitioner or I may terminate the telemedicine visit at any time. I understand that I have the right to inspect all information obtained and/or recorded in the course of the telemedicine visit and may receive copies of available information for a reasonable fee.  I understand that some of the potential risks of receiving the Services via telemedicine include:  Delay or  interruption in medical evaluation due to technological equipment failure or disruption; Information transmitted may not be sufficient (e.g. poor resolution of images) to allow for appropriate medical decision making by the Practitioner; and/or  In rare instances, security protocols could fail, causing a breach of personal health information.  Furthermore, I acknowledge that it is my responsibility to provide information about my medical history, conditions and care that is complete and accurate to the best of my ability. I acknowledge that Practitioner's advice, recommendations, and/or decision may be based on factors not within their control, such as incomplete or inaccurate data provided by me or distortions of diagnostic images or specimens that may result from electronic transmissions. I understand that the practice of medicine is not an exact science and that Practitioner makes no warranties or guarantees regarding treatment outcomes. I acknowledge that a copy of this consent can be made available to me via my patient portal [facility] MyChart), or I can request a printed copy by calling the office of [facility].    I understand that my insurance will be billed for this visit.   I have read or had this consent read to me. I understand the contents of this consent, which adequately explains the benefits and risks of the Services being provided via telemedicine.  I have been provided ample opportunity to ask questions regarding this consent and the Services and have had my questions answered to my satisfaction. I give my informed consent for the services to be provided through the use of telemedicine in my medical care

## 2022-09-09 NOTE — Telephone Encounter (Signed)
Pt has been scheduled for tele pre op appt 09/22/22 @ 10:20. Med rec and consent are done. Pt said she is taking a pre natal vitamin now but did not know the name of it and she was taking care of her baby right now. I said that is fine.

## 2022-09-09 NOTE — Telephone Encounter (Signed)
Pt has been scheduled for tele pre op appt 09/22/22 @ 10:20. Med rec and consent are done. Pt said she is taking a pre natal vitamin now but did not know the name of it and she was taking care of her baby right now. I said that is fine.     Patient Consent for Virtual Visit        Marixa Bultema has provided verbal consent on 09/09/2022 for a virtual visit (video or telephone).   CONSENT FOR VIRTUAL VISIT FOR:  Misty [name]  By participating in this virtual visit I agree to the following:  I hereby voluntarily request, consent and authorize [facility] and its employed or contracted physicians, physician assistants, nurse practitioners or other licensed health care professionals (the Practitioner), to provide me with telemedicine health care services (the "Services") as deemed necessary by the treating Practitioner. I acknowledge and consent to receive the Services by the Practitioner via telemedicine. I understand that the telemedicine visit will involve communicating with the Practitioner through live audiovisual communication technology and the disclosure of certain medical information by electronic transmission. I acknowledge that I have been given the opportunity to request an in-person assessment or other available alternative prior to the telemedicine visit and am voluntarily participating in the telemedicine visit.  I understand that I have the right to withhold or withdraw my consent to the use of telemedicine in the course of my care at any time, without affecting my right to future care or treatment, and that the Practitioner or I may terminate the telemedicine visit at any time. I understand that I have the right to inspect all information obtained and/or recorded in the course of the telemedicine visit and may receive copies of available information for a reasonable fee.  I understand that some of the potential risks of receiving the Services via telemedicine include:  Delay or  interruption in medical evaluation due to technological equipment failure or disruption; Information transmitted may not be sufficient (e.g. poor resolution of images) to allow for appropriate medical decision making by the Practitioner; and/or  In rare instances, security protocols could fail, causing a breach of personal health information.  Furthermore, I acknowledge that it is my responsibility to provide information about my medical history, conditions and care that is complete and accurate to the best of my ability. I acknowledge that Practitioner's advice, recommendations, and/or decision may be based on factors not within their control, such as incomplete or inaccurate data provided by me or distortions of diagnostic images or specimens that may result from electronic transmissions. I understand that the practice of medicine is not an exact science and that Practitioner makes no warranties or guarantees regarding treatment outcomes. I acknowledge that a copy of this consent can be made available to me via my patient portal [facility] MyChart), or I can request a printed copy by calling the office of [facility].    I understand that my insurance will be billed for this visit.   I have read or had this consent read to me. I understand the contents of this consent, which adequately explains the benefits and risks of the Services being provided via telemedicine.  I have been provided ample opportunity to ask questions regarding this consent and the Services and have had my questions answered to my satisfaction. I give my informed consent for the services to be provided through the use of telemedicine in my medical care

## 2022-09-16 NOTE — Telephone Encounter (Signed)
Our office received a duplicate request today. Pt has a tele pre op appt scheduled for 09/22/22. Once the pt has been cleared we will be sure to fax clearance notes.

## 2022-09-22 ENCOUNTER — Ambulatory Visit: Payer: MEDICAID | Attending: Internal Medicine

## 2022-09-22 DIAGNOSIS — Z01810 Encounter for preprocedural cardiovascular examination: Secondary | ICD-10-CM

## 2022-09-22 NOTE — Progress Notes (Signed)
Virtual Visit via Telephone Note   Because of Misty [name]'s co-morbid illnesses, she is at least at moderate risk for complications without adequate follow up.  This format is felt to be most appropriate for this patient at this time.  The patient did not have access to video technology/had technical difficulties with video requiring transitioning to audio format only (telephone).  All issues noted in this document were discussed and addressed.  No physical exam could be performed with this format.  Please refer to the patient's chart for her consent to telehealth for [facility].  Evaluation Performed:  Preoperative cardiovascular risk assessment _____________   Date:  09/22/2022   Patient ID:  Misty [name], DOB 02-13-1995, MRN [id number] Patient Location:  Home Provider location:   Office  Primary Care Provider:  [name], FNP Primary Cardiologist:  None  Chief Complaint / Patient Profile   27 y.o. y/o female with a h/o hypertension, bipolar disorder, morbid obesity, intracranial hypertension, OSA on CPAP, bipolar disorder who is pending cholecystectomy and presents today for telephonic preoperative cardiovascular risk assessment.  History of Present Illness    Misty [name] is a 27 y.o. female who presents via Web designer for a telehealth visit today.  Pt was last seen in cardiology clinic on 04/14/2022 by Dr. Bing Matter.  At that time [facility] was doing well.  Echocardiogram from March of this year reviewed.  The patient is now pending procedure as outlined above. Since her last visit, she tells me she has not had any increase in shortness of breath, no chest pain or palpitations.  She does get short of breath when she goes up and down stairs but this is nothing new.  She does meet minimal METS requirements on the DASI.  Regarding ASA therapy it may be stopped 5-7 days prior to surgery with a plan to resume it as soon as felt to be feasible from a  surgical standpoint in the post-operative period. Go off tomorrow, surgery is on  8/21.  Past Medical History    Past Medical History:  Diagnosis Date   Allergic rhinitis    Anxiety    Bipolar 1 disorder (HCC) 04/15/2017   Bipolar 1 disorder, depressed, severe (HCC) 12/16/2017   Bipolar I disorder (HCC) 10/10/2015   BMI 70 and over, adult (HCC) 12/03/2015   Borderline personality disorder (HCC) 03/14/2015   Depression    Fatty liver 10/10/2015   GAD (generalized anxiety disorder) 07/19/2012   Gastrointestinal disease 05/16/2013   GERD (gastroesophageal reflux disease)    H/O ASTHMA 10/08/2014   HTN (hypertension) 07/13/12   Hypertension 05/16/2013   IBS (irritable bowel syndrome)    Increased frequency of urination 04/12/2014   INTERMITTENT URTICARIA 10/08/2014   Intractable migraine without aura with status migrainosus 05/16/2013   Iron deficiency 10/10/2015   Iron deficiency anemia 12/11/2015   Lithium toxicity 04/05/2016   Major depressive disorder, recurrent, 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   MDD (major depressive disorder) 11/18/2018   MDD (major depressive disorder), recurrent episode, moderate (HCC) 03/01/2012   MDD (major depressive disorder), recurrent episode, severe (HCC) 03/15/2019   Metabolic syndrome 09/04/2012   Morbid obesity (HCC) 04/03/2015   Obesity    Obesity affecting pregnancy in second trimester 04/11/2020   Obstructive sleep apnea 07/19/2012   ODD (oppositional defiant disorder) 07/19/2012   OSA on CPAP    Palpitations 07/21/2015   Posttraumatic stress disorder 09/04/2014   Primary hypertension 07/13/2012   Formatting of this note might  be different from the original. Cardiac ECHO 07/23/15: Technically difficult study due to habitus Left ventricular cavity is normal in size. Normal diastolic filling pattern. Calculated  EF 71% Left atrial cavitiy mildly dilated Trace TR   Psoriasis    Schizoaffective disorder, depressive type (HCC) 03/23/2017   Sinus tachycardia 08/07/2014    Supervision of high risk pregnancy in second trimester 05/25/2020   Last Assessment & Plan:  Formatting of this note might be different from the original.  Discussed age related risk for fetal aneuploidy  Reviewed previous noninvasive prenatal screening attempts ([place] through [place] lab)- no results obtained due to low fetal fraction  Discussed various reasons for low fetal fraction including increased maternal BMI, early gestational age, maternal use of an   Swelling of both lower extremities 12/14/2016   Urge incontinence of urine 04/12/2014   Past Surgical History:  Procedure Laterality Date   ADENOIDECTOMY     SINOSCOPY     TONSILLECTOMY AND ADENOIDECTOMY  Age 54   Deviated septum corrected at same time    Allergies  Allergies  Allergen Reactions   Etanercept Nausea And Vomiting    Unsure re reaction   Latex Hives   Molds & Smuts Anaphylaxis    Other Reaction(s): Other  allergy test     allergy test  allergy test   allergy test   Tape Rash    Other reaction(s): Rash Other reaction(s): Rash Other reaction(s): Rash    Other reaction(s): RashOther reaction(s): RashOther reaction(s): Rash  Other reaction(s): Rash, Other reaction(s): Rash, Other reaction(s): Rash   Tramadol Rash and Itching    Pt reports itching as well  Pt reports itching as well    Other reaction(s): Rash Pt reports itching as well Other reaction(s): Rash Pt reports itching as well Pt reports itching as well Pt reports itching as well Pt reports itching as well    Other reaction(s): Rash Pt reports itching as well Pt reports itching as well Pt reports itching as well Pt reports itching as well    Pt reports itching as well Pt reports itching as well Pt reports itching as well    Other reaction(s): Rash Pt reports itching as well Other reaction(s): Rash Pt reports itching as well Other reaction(s): Rash Pt reports itching as well Pt reports itching as well Pt reports itching as well Pt reports  itching as well  Other reaction(s): Rash, Pt reports itching as well, Other reaction(s): Rash, Pt reports itching as well, Other reaction(s): Rash, Pt reports itching as well, Pt reports itching as well, Pt reports itching as well, Pt reports itching as well   Other Rash and Other (See Comments)    Other reaction(s): Other (See Comments) Other reaction(s): Rash Must wear at least sterling silver jewelry Other reaction(s): Rash Must wear at least sterling silver jewelry Other reaction(s): Rash Must wear at least sterling silver jewelry Other reaction(s): Other (See Comments) Other reaction(s): Rash Must wear at least sterling silver jewelry   Ziprasidone Hcl Nausea And Vomiting    Unsure of reaction  Unsure of reaction    Unk reaction  Unsure of reaction  Unk reaction    Unsure of reaction   Tramadol Hcl Hives   Ziprasidone     Home Medications    Prior to Admission medications   Medication Sig Start Date End Date Taking? Authorizing Provider  acetaminophen (TYLENOL) 325 MG suppository Place 325 mg rectally every 8 (eight) hours as needed for mild pain.    [provider]  acetaZOLAMIDE ER (DIAMOX) 500 MG capsule Take 1,500 mg by mouth 2 (two) times daily. 06/12/21   [provider]  aspirin 325 MG tablet Take 325 mg by mouth daily. Swallow whole.    [provider]  buPROPion (WELLBUTRIN XL) 300 MG 24 hr tablet Take 450 mg by mouth every morning. 01/20/21   [provider]  CIMZIA 2 X 200 MG/ML PSKT Inject 400 mg into the skin every 14 (fourteen) days. Patient not taking: Reported on 09/09/2022 06/19/21   [provider]  FLOVENT HFA 110 MCG/ACT inhaler Inhale 2 puffs into the lungs 2 (two) times daily. 06/05/21   [provider]  hydrALAZINE (APRESOLINE) 25 MG tablet Take 2 tablets (50 mg total) by mouth 4 (four) times daily. 04/26/22   Georgeanna Lea, MD  levothyroxine (SYNTHROID) 50 MCG tablet Take 1 tablet (50 mcg total)  by mouth daily at 6 (six) AM. Patient taking differently: Take 75 mcg by mouth daily at 6 (six) AM. 03/24/19   Money, Gerlene Burdock, FNP  montelukast (SINGULAIR) 10 MG tablet Take 1 tablet (10 mg total) by mouth at bedtime. 03/23/19   Money, Gerlene Burdock, FNP  NIFEdipine (PROCARDIA XL) 60 MG 24 hr tablet Take 1 tablet (60 mg total) by mouth daily. 04/26/22   Georgeanna Lea, MD  pantoprazole (PROTONIX) 40 MG tablet Take 40 mg by mouth daily.    [provider]  sertraline (ZOLOFT) 100 MG tablet Take 200 mg by mouth daily.    [provider]  VENTOLIN HFA 108 (90 Base) MCG/ACT inhaler Inhale 2 puffs into the lungs every 4 (four) hours as needed for wheezing or shortness of breath. 01/08/21   [provider]    Physical Exam    Vital Signs:  Marvina Miskell does not have vital signs available for review today.  Given telephonic nature of communication, physical exam is limited. AAOx3. NAD. Normal affect.  Speech and respirations are unlabored.  Accessory Clinical Findings    None  Assessment & Plan    1.  Preoperative Cardiovascular Risk Assessment:  Ms. Yslas perioperative risk of a major cardiac event is 0.9% according to the Revised Cardiac Risk Index (RCRI).  Therefore, she is at low risk for perioperative complications.   Her functional capacity is good at 5.07 METs according to the [facility] Activity Status Index (DASI). Recommendations: According to ACC/AHA guidelines, no further cardiovascular testing needed.  The patient may proceed to surgery at acceptable risk.   Antiplatelet and/or Anticoagulation Recommendations: Aspirin can be held for 5-7 days prior to her surgery.  Please resume Aspirin post operatively when it is felt to be safe from a bleeding standpoint.    The patient was advised that if she develops new symptoms prior to surgery to contact our office to arrange for a follow-up visit, and she verbalized understanding.   A copy of this note will be routed  to requesting surgeon.  Time:   Today, I have spent 5 minutes with the patient with telehealth technology discussing medical history, symptoms, and management plan.     Sharlene Dory, PA-C  09/22/2022, 10:23 AM

## 2022-10-18 DIAGNOSIS — Z09 Encounter for follow-up examination after completed treatment for conditions other than malignant neoplasm: Secondary | ICD-10-CM | POA: Insufficient documentation

## 2022-11-16 ENCOUNTER — Ambulatory Visit: Admit: 2022-11-16 | Discharge: 2022-11-17 | Payer: MEDICARE

## 2022-11-16 DIAGNOSIS — Z818 Family history of other mental and behavioral disorders: Secondary | ICD-10-CM | POA: Insufficient documentation

## 2022-11-16 DIAGNOSIS — L409 Psoriasis, unspecified: Principal | ICD-10-CM

## 2022-11-16 DIAGNOSIS — G932 Benign intracranial hypertension: Principal | ICD-10-CM

## 2022-11-16 DIAGNOSIS — Z8759 Personal history of other complications of pregnancy, childbirth and the puerperium: Principal | ICD-10-CM

## 2022-11-16 DIAGNOSIS — E039 Hypothyroidism, unspecified: Principal | ICD-10-CM

## 2022-11-16 DIAGNOSIS — O34219 Maternal care for unspecified type scar from previous cesarean delivery: Principal | ICD-10-CM

## 2022-11-16 DIAGNOSIS — O99283 Endocrine, nutritional and metabolic diseases complicating pregnancy, third trimester: Principal | ICD-10-CM

## 2022-11-16 DIAGNOSIS — J45909 Unspecified asthma, uncomplicated: Principal | ICD-10-CM

## 2022-11-16 DIAGNOSIS — O99513 Diseases of the respiratory system complicating pregnancy, third trimester: Principal | ICD-10-CM

## 2022-11-16 DIAGNOSIS — Z1389 Encounter for screening for other disorder: Principal | ICD-10-CM

## 2022-11-16 MED ORDER — BUTALBITAL-ACETAMINOPHEN-CAFFEINE 50 MG-300 MG-40 MG CAPSULE
ORAL_CAPSULE | ORAL | 0 refills | 2 days | Status: CP | PRN
Start: 2022-11-16 — End: 2022-12-01

## 2022-11-16 MED ORDER — CIMZIA 400 MG/2 ML (200 MG/ML X 2) SUBCUTANEOUS SYRINGE KIT
SUBCUTANEOUS | 1 refills | 28 days | Status: CP
Start: 2022-11-16 — End: ?

## 2022-12-07 ENCOUNTER — Encounter: Admit: 2022-12-07 | Discharge: 2022-12-08 | Payer: MEDICARE

## 2022-12-07 ENCOUNTER — Ambulatory Visit: Admit: 2022-12-07 | Discharge: 2022-12-08 | Payer: MEDICARE

## 2022-12-07 DIAGNOSIS — Z1389 Encounter for screening for other disorder: Principal | ICD-10-CM

## 2022-12-07 DIAGNOSIS — O99283 Endocrine, nutritional and metabolic diseases complicating pregnancy, third trimester: Principal | ICD-10-CM

## 2022-12-07 DIAGNOSIS — O26891 Other specified pregnancy related conditions, first trimester: Principal | ICD-10-CM

## 2022-12-07 DIAGNOSIS — E039 Hypothyroidism, unspecified: Principal | ICD-10-CM

## 2022-12-07 DIAGNOSIS — Z6791 Unspecified blood type, Rh negative: Principal | ICD-10-CM

## 2022-12-07 DIAGNOSIS — O209 Hemorrhage in early pregnancy, unspecified: Principal | ICD-10-CM

## 2022-12-07 DIAGNOSIS — Z818 Family history of other mental and behavioral disorders: Principal | ICD-10-CM

## 2022-12-07 DIAGNOSIS — Z6841 Body Mass Index (BMI) 40.0 and over, adult: Principal | ICD-10-CM

## 2022-12-07 DIAGNOSIS — Z8759 Personal history of other complications of pregnancy, childbirth and the puerperium: Principal | ICD-10-CM

## 2022-12-07 DIAGNOSIS — O99211 Obesity complicating pregnancy, first trimester: Principal | ICD-10-CM

## 2022-12-07 DIAGNOSIS — O0991 Supervision of high risk pregnancy, unspecified, first trimester: Principal | ICD-10-CM

## 2022-12-07 MED ORDER — BUTALBITAL-ACETAMINOPHEN-CAFFEINE 50 MG-300 MG-40 MG CAPSULE
ORAL_CAPSULE | ORAL | 2 refills | 2 days | Status: CP | PRN
Start: 2022-12-07 — End: ?

## 2022-12-07 MED ORDER — METRONIDAZOLE 500 MG TABLET
ORAL_TABLET | Freq: Two times a day (BID) | ORAL | 0 refills | 7 days | Status: CP
Start: 2022-12-07 — End: 2022-12-14

## 2022-12-16 LAB — PANORAMA PRENATAL TEST FULL PANEL:PANORAMA TEST PLUS 5 ADDITIONAL MICRODELETIONS: FETAL FRACTION: 3.7

## 2022-12-22 ENCOUNTER — Ambulatory Visit: Admit: 2022-12-22 | Discharge: 2022-12-23 | Payer: MEDICARE

## 2022-12-22 DIAGNOSIS — G932 Benign intracranial hypertension: Principal | ICD-10-CM

## 2022-12-27 ENCOUNTER — Ambulatory Visit: Admit: 2022-12-27 | Discharge: 2022-12-28 | Payer: MEDICARE

## 2022-12-27 DIAGNOSIS — G932 Benign intracranial hypertension: Principal | ICD-10-CM

## 2022-12-28 ENCOUNTER — Ambulatory Visit: Admit: 2022-12-28 | Discharge: 2022-12-29 | Payer: MEDICARE

## 2022-12-29 ENCOUNTER — Ambulatory Visit: Admit: 2022-12-29 | Discharge: 2022-12-30 | Payer: MEDICARE

## 2022-12-29 DIAGNOSIS — Z8759 Personal history of other complications of pregnancy, childbirth and the puerperium: Principal | ICD-10-CM

## 2022-12-29 DIAGNOSIS — Z818 Family history of other mental and behavioral disorders: Principal | ICD-10-CM

## 2022-12-29 DIAGNOSIS — G932 Benign intracranial hypertension: Principal | ICD-10-CM

## 2022-12-29 MED ORDER — LABETALOL 200 MG TABLET
ORAL_TABLET | Freq: Two times a day (BID) | ORAL | 0 refills | 30 days | Status: CP
Start: 2022-12-29 — End: ?

## 2023-01-11 ENCOUNTER — Ambulatory Visit: Admit: 2023-01-11 | Discharge: 2023-01-12 | Payer: MEDICARE

## 2023-01-11 DIAGNOSIS — R002 Palpitations: Principal | ICD-10-CM

## 2023-01-11 DIAGNOSIS — Z818 Family history of other mental and behavioral disorders: Principal | ICD-10-CM

## 2023-01-11 DIAGNOSIS — Z8759 Personal history of other complications of pregnancy, childbirth and the puerperium: Principal | ICD-10-CM

## 2023-01-11 DIAGNOSIS — I10 Essential (primary) hypertension: Principal | ICD-10-CM

## 2023-01-11 DIAGNOSIS — O0991 Supervision of high risk pregnancy, unspecified, first trimester: Principal | ICD-10-CM

## 2023-01-11 DIAGNOSIS — R42 Dizziness and giddiness: Principal | ICD-10-CM

## 2023-01-17 ENCOUNTER — Other Ambulatory Visit: Payer: Self-pay | Admitting: Cardiology

## 2023-01-18 ENCOUNTER — Ambulatory Visit: Admit: 2023-01-18 | Discharge: 2023-01-19 | Payer: MEDICARE

## 2023-01-18 DIAGNOSIS — Z8759 Personal history of other complications of pregnancy, childbirth and the puerperium: Principal | ICD-10-CM

## 2023-01-18 DIAGNOSIS — Z818 Family history of other mental and behavioral disorders: Principal | ICD-10-CM

## 2023-01-18 DIAGNOSIS — O0991 Supervision of high risk pregnancy, unspecified, first trimester: Principal | ICD-10-CM

## 2023-01-18 DIAGNOSIS — I10 Essential (primary) hypertension: Principal | ICD-10-CM

## 2023-01-18 MED ORDER — FERROUS SULFATE 325 MG (65 MG IRON) TABLET,DELAYED RELEASE
ORAL_TABLET | Freq: Every day | ORAL | 11 refills | 30.00 days | Status: CP
Start: 2023-01-18 — End: 2024-01-18

## 2023-01-18 MED ORDER — DOCUSATE SODIUM 100 MG CAPSULE
ORAL_CAPSULE | Freq: Two times a day (BID) | ORAL | 11 refills | 30.00 days | Status: CP
Start: 2023-01-18 — End: 2024-01-18

## 2023-01-24 MED ORDER — LABETALOL 200 MG TABLET
ORAL_TABLET | Freq: Two times a day (BID) | ORAL | 0 refills | 30.00 days | Status: CP
Start: 2023-01-24 — End: ?

## 2023-01-25 ENCOUNTER — Ambulatory Visit: Admit: 2023-01-25 | Discharge: 2023-01-26 | Payer: MEDICARE

## 2023-01-25 DIAGNOSIS — Z818 Family history of other mental and behavioral disorders: Principal | ICD-10-CM

## 2023-01-25 DIAGNOSIS — Z8759 Personal history of other complications of pregnancy, childbirth and the puerperium: Principal | ICD-10-CM

## 2023-01-25 DIAGNOSIS — O0991 Supervision of high risk pregnancy, unspecified, first trimester: Principal | ICD-10-CM

## 2023-01-25 DIAGNOSIS — I10 Essential (primary) hypertension: Principal | ICD-10-CM

## 2023-01-25 MED ORDER — LABETALOL 200 MG TABLET
ORAL_TABLET | Freq: Two times a day (BID) | ORAL | 6 refills | 30.00 days | Status: CP
Start: 2023-01-25 — End: ?

## 2023-01-27 DIAGNOSIS — L409 Psoriasis, unspecified: Principal | ICD-10-CM

## 2023-02-09 DIAGNOSIS — O0991 Supervision of high risk pregnancy, unspecified, first trimester: Principal | ICD-10-CM

## 2023-02-09 DIAGNOSIS — Z8759 Personal history of other complications of pregnancy, childbirth and the puerperium: Principal | ICD-10-CM

## 2023-02-09 DIAGNOSIS — I10 Essential (primary) hypertension: Principal | ICD-10-CM

## 2023-02-09 DIAGNOSIS — Z818 Family history of other mental and behavioral disorders: Principal | ICD-10-CM

## 2023-02-13 ENCOUNTER — Emergency Department: Admit: 2023-02-13 | Discharge: 2023-02-14 | Disposition: A | Discharge status: Home / Self Care | Payer: MEDICARE

## 2023-02-13 DIAGNOSIS — Z818 Family history of other mental and behavioral disorders: Principal | ICD-10-CM

## 2023-02-13 DIAGNOSIS — Z8759 Personal history of other complications of pregnancy, childbirth and the puerperium: Principal | ICD-10-CM

## 2023-02-13 DIAGNOSIS — I10 Essential (primary) hypertension: Principal | ICD-10-CM

## 2023-02-13 DIAGNOSIS — O0991 Supervision of high risk pregnancy, unspecified, first trimester: Principal | ICD-10-CM

## 2023-02-15 ENCOUNTER — Inpatient Hospital Stay: Admit: 2023-02-15 | Discharge: 2023-02-16 | Payer: MEDICARE

## 2023-02-15 ENCOUNTER — Ambulatory Visit: Admit: 2023-02-15 | Discharge: 2023-02-16 | Payer: MEDICARE

## 2023-02-15 DIAGNOSIS — L409 Psoriasis, unspecified: Principal | ICD-10-CM

## 2023-02-15 DIAGNOSIS — Z8759 Personal history of other complications of pregnancy, childbirth and the puerperium: Principal | ICD-10-CM

## 2023-02-15 DIAGNOSIS — O99283 Endocrine, nutritional and metabolic diseases complicating pregnancy, third trimester: Principal | ICD-10-CM

## 2023-02-15 DIAGNOSIS — O162 Unspecified maternal hypertension, second trimester: Principal | ICD-10-CM

## 2023-02-15 DIAGNOSIS — O0991 Supervision of high risk pregnancy, unspecified, first trimester: Principal | ICD-10-CM

## 2023-02-15 DIAGNOSIS — O26891 Other specified pregnancy related conditions, first trimester: Principal | ICD-10-CM

## 2023-02-15 DIAGNOSIS — G4733 Obstructive sleep apnea (adult) (pediatric): Principal | ICD-10-CM

## 2023-02-15 DIAGNOSIS — O99282 Endocrine, nutritional and metabolic diseases complicating pregnancy, second trimester: Principal | ICD-10-CM

## 2023-02-15 DIAGNOSIS — G932 Benign intracranial hypertension: Principal | ICD-10-CM

## 2023-02-15 DIAGNOSIS — Z818 Family history of other mental and behavioral disorders: Principal | ICD-10-CM

## 2023-02-15 DIAGNOSIS — O99513 Diseases of the respiratory system complicating pregnancy, third trimester: Principal | ICD-10-CM

## 2023-02-15 DIAGNOSIS — Z1389 Encounter for screening for other disorder: Principal | ICD-10-CM

## 2023-02-15 DIAGNOSIS — O99210 Obesity complicating pregnancy, unspecified trimester: Principal | ICD-10-CM

## 2023-02-15 DIAGNOSIS — E039 Hypothyroidism, unspecified: Principal | ICD-10-CM

## 2023-02-15 DIAGNOSIS — O09892 Supervision of other high risk pregnancies, second trimester: Principal | ICD-10-CM

## 2023-02-15 DIAGNOSIS — O09292 Supervision of pregnancy with other poor reproductive or obstetric history, second trimester: Principal | ICD-10-CM

## 2023-02-15 DIAGNOSIS — I10 Essential (primary) hypertension: Principal | ICD-10-CM

## 2023-02-15 DIAGNOSIS — J45909 Unspecified asthma, uncomplicated: Principal | ICD-10-CM

## 2023-02-15 DIAGNOSIS — Z6791 Unspecified blood type, Rh negative: Principal | ICD-10-CM

## 2023-02-15 DIAGNOSIS — Z98891 History of uterine scar from previous surgery: Principal | ICD-10-CM

## 2023-02-15 DIAGNOSIS — Z3A20 20 weeks gestation of pregnancy: Principal | ICD-10-CM

## 2023-02-15 MED ORDER — LABETALOL 200 MG TABLET
ORAL_TABLET | Freq: Two times a day (BID) | ORAL | 0 refills | 10.00 days | Status: CP
Start: 2023-02-15 — End: ?

## 2023-02-15 MED ORDER — MAGNESIUM OXIDE 400 MG (241.3 MG MAGNESIUM) TABLET
ORAL_TABLET | Freq: Every day | ORAL | 11 refills | 30.00 days | Status: CP
Start: 2023-02-15 — End: 2024-02-15

## 2023-03-01 MED ORDER — BUTALBITAL-ACETAMINOPHEN-CAFFEINE 50 MG-325 MG-40 MG TABLET
ORAL_TABLET | 0 refills | 0.00 days | Status: CP
Start: 2023-03-01 — End: ?

## 2023-03-01 MED ORDER — LABETALOL 200 MG TABLET
ORAL_TABLET | Freq: Two times a day (BID) | ORAL | 0 refills | 10.00 days | Status: CP
Start: 2023-03-01 — End: ?

## 2023-03-02 ENCOUNTER — Ambulatory Visit: Admit: 2023-03-02 | Discharge: 2023-03-03 | Payer: MEDICARE

## 2023-03-02 DIAGNOSIS — O0991 Supervision of high risk pregnancy, unspecified, first trimester: Principal | ICD-10-CM

## 2023-03-02 DIAGNOSIS — O99513 Diseases of the respiratory system complicating pregnancy, third trimester: Principal | ICD-10-CM

## 2023-03-02 DIAGNOSIS — G932 Benign intracranial hypertension: Principal | ICD-10-CM

## 2023-03-02 DIAGNOSIS — I10 Essential (primary) hypertension: Principal | ICD-10-CM

## 2023-03-02 DIAGNOSIS — Z818 Family history of other mental and behavioral disorders: Principal | ICD-10-CM

## 2023-03-02 DIAGNOSIS — O99283 Endocrine, nutritional and metabolic diseases complicating pregnancy, third trimester: Principal | ICD-10-CM

## 2023-03-02 DIAGNOSIS — E039 Hypothyroidism, unspecified: Principal | ICD-10-CM

## 2023-03-02 DIAGNOSIS — Z8759 Personal history of other complications of pregnancy, childbirth and the puerperium: Principal | ICD-10-CM

## 2023-03-02 DIAGNOSIS — J45909 Unspecified asthma, uncomplicated: Principal | ICD-10-CM

## 2023-03-03 ENCOUNTER — Ambulatory Visit: Admit: 2023-03-03 | Discharge: 2023-03-04 | Payer: MEDICARE

## 2023-03-03 DIAGNOSIS — L03311 Cellulitis of abdominal wall: Principal | ICD-10-CM

## 2023-03-03 MED ORDER — CEPHALEXIN 500 MG CAPSULE
ORAL_CAPSULE | Freq: Four times a day (QID) | ORAL | 0 refills | 7.00 days | Status: CP
Start: 2023-03-03 — End: 2023-03-10

## 2023-03-04 ENCOUNTER — Ambulatory Visit: Admit: 2023-03-04 | Discharge: 2023-03-05 | Payer: MEDICARE | Attending: Medical | Primary: Medical

## 2023-03-04 MED ORDER — DERMA-SMOOTHE/FS SCALP OIL 0.01 %
0 refills | 0.00 days
Start: 2023-03-04 — End: ?

## 2023-03-05 MED ORDER — DERMA-SMOOTHE/FS SCALP OIL 0.01 %
0 refills | 0.00 days
Start: 2023-03-05 — End: ?

## 2023-03-05 MED ORDER — FLUOCINOLONE 0.01 % SCALP OIL AND SHOWER CAP
Freq: Every day | TOPICAL | 2 refills | 0.00 days
Start: 2023-03-05 — End: 2024-03-04

## 2023-03-06 MED ORDER — CLOBETASOL 0.05 % SCALP SOLUTION
Freq: Two times a day (BID) | TOPICAL | 5 refills | 0.00 days | Status: CP
Start: 2023-03-06 — End: 2024-03-05

## 2023-03-14 MED ORDER — LABETALOL 200 MG TABLET
ORAL_TABLET | Freq: Two times a day (BID) | ORAL | 0 refills | 10.00 days | Status: CP
Start: 2023-03-14 — End: ?

## 2023-03-15 ENCOUNTER — Ambulatory Visit: Admit: 2023-03-15 | Discharge: 2023-03-16 | Payer: MEDICARE

## 2023-03-15 ENCOUNTER — Inpatient Hospital Stay: Admit: 2023-03-15 | Discharge: 2023-03-16 | Payer: MEDICARE

## 2023-03-15 DIAGNOSIS — L409 Psoriasis, unspecified: Principal | ICD-10-CM

## 2023-03-15 DIAGNOSIS — E039 Hypothyroidism, unspecified: Principal | ICD-10-CM

## 2023-03-15 DIAGNOSIS — O0991 Supervision of high risk pregnancy, unspecified, first trimester: Principal | ICD-10-CM

## 2023-03-15 DIAGNOSIS — G932 Benign intracranial hypertension: Principal | ICD-10-CM

## 2023-03-15 DIAGNOSIS — O99210 Obesity complicating pregnancy, unspecified trimester: Principal | ICD-10-CM

## 2023-03-15 DIAGNOSIS — Z818 Family history of other mental and behavioral disorders: Principal | ICD-10-CM

## 2023-03-15 DIAGNOSIS — O99283 Endocrine, nutritional and metabolic diseases complicating pregnancy, third trimester: Principal | ICD-10-CM

## 2023-03-15 DIAGNOSIS — I10 Essential (primary) hypertension: Principal | ICD-10-CM

## 2023-03-15 DIAGNOSIS — Z8759 Personal history of other complications of pregnancy, childbirth and the puerperium: Principal | ICD-10-CM

## 2023-03-15 MED ORDER — ACETAZOLAMIDE ER 500 MG CAPSULE,EXTENDED RELEASE
ORAL_CAPSULE | Freq: Three times a day (TID) | ORAL | 6 refills | 20.00 days | Status: CP
Start: 2023-03-15 — End: ?

## 2023-03-22 ENCOUNTER — Emergency Department: Admit: 2023-03-22 | Discharge: 2023-03-23 | Disposition: A | Discharge status: Home / Self Care | Payer: MEDICARE

## 2023-03-22 MED ORDER — LABETALOL 100 MG TABLET
ORAL_TABLET | Freq: Two times a day (BID) | ORAL | 0 refills | 30.00 days | Status: CP
Start: 2023-03-22 — End: 2023-04-21

## 2023-03-23 ENCOUNTER — Ambulatory Visit: Admit: 2023-03-23 | Discharge: 2023-03-24 | Payer: MEDICARE

## 2023-03-23 DIAGNOSIS — O0991 Supervision of high risk pregnancy, unspecified, first trimester: Principal | ICD-10-CM

## 2023-03-23 DIAGNOSIS — I10 Essential (primary) hypertension: Principal | ICD-10-CM

## 2023-03-23 DIAGNOSIS — Z8759 Personal history of other complications of pregnancy, childbirth and the puerperium: Principal | ICD-10-CM

## 2023-03-23 DIAGNOSIS — O99210 Obesity complicating pregnancy, unspecified trimester: Principal | ICD-10-CM

## 2023-03-23 DIAGNOSIS — E039 Hypothyroidism, unspecified: Principal | ICD-10-CM

## 2023-03-23 DIAGNOSIS — Z818 Family history of other mental and behavioral disorders: Principal | ICD-10-CM

## 2023-03-23 DIAGNOSIS — O99283 Endocrine, nutritional and metabolic diseases complicating pregnancy, third trimester: Principal | ICD-10-CM

## 2023-03-23 DIAGNOSIS — O0992 Supervision of high risk pregnancy, unspecified, second trimester: Principal | ICD-10-CM

## 2023-03-25 MED ORDER — BUTALBITAL-ACETAMINOPHEN-CAFFEINE 50 MG-325 MG-40 MG TABLET
ORAL_TABLET | Freq: Four times a day (QID) | ORAL | 0 refills | 2.00 days | Status: CP | PRN
Start: 2023-03-25 — End: ?

## 2023-04-05 ENCOUNTER — Ambulatory Visit: Admit: 2023-04-05 | Discharge: 2023-04-06 | Payer: MEDICARE

## 2023-04-05 DIAGNOSIS — O99513 Diseases of the respiratory system complicating pregnancy, third trimester: Principal | ICD-10-CM

## 2023-04-05 DIAGNOSIS — O0991 Supervision of high risk pregnancy, unspecified, first trimester: Principal | ICD-10-CM

## 2023-04-05 DIAGNOSIS — Z8759 Personal history of other complications of pregnancy, childbirth and the puerperium: Principal | ICD-10-CM

## 2023-04-05 DIAGNOSIS — G932 Benign intracranial hypertension: Principal | ICD-10-CM

## 2023-04-05 DIAGNOSIS — I10 Essential (primary) hypertension: Principal | ICD-10-CM

## 2023-04-05 DIAGNOSIS — J45909 Unspecified asthma, uncomplicated: Principal | ICD-10-CM

## 2023-04-05 DIAGNOSIS — Z818 Family history of other mental and behavioral disorders: Principal | ICD-10-CM

## 2023-04-05 DIAGNOSIS — O34219 Maternal care for unspecified type scar from previous cesarean delivery: Principal | ICD-10-CM

## 2023-04-05 MED ORDER — CYCLOBENZAPRINE 10 MG TABLET
ORAL_TABLET | Freq: Every evening | ORAL | 3 refills | 30.00 days | Status: CP
Start: 2023-04-05 — End: ?

## 2023-04-11 DIAGNOSIS — Z79899 Other long term (current) drug therapy: Principal | ICD-10-CM

## 2023-04-13 ENCOUNTER — Other Ambulatory Visit: Admit: 2023-04-13 | Discharge: 2023-04-14 | Payer: MEDICARE

## 2023-04-13 DIAGNOSIS — Z79899 Other long term (current) drug therapy: Principal | ICD-10-CM

## 2023-04-13 DIAGNOSIS — O0991 Supervision of high risk pregnancy, unspecified, first trimester: Principal | ICD-10-CM

## 2023-04-13 DIAGNOSIS — Z3490 Encounter for supervision of normal pregnancy, unspecified, unspecified trimester: Principal | ICD-10-CM

## 2023-04-15 ENCOUNTER — Inpatient Hospital Stay: Admit: 2023-04-15 | Discharge: 2023-04-16 | Payer: MEDICARE

## 2023-04-15 ENCOUNTER — Encounter: Admit: 2023-04-15 | Discharge: 2023-04-16 | Payer: MEDICARE

## 2023-04-15 ENCOUNTER — Ambulatory Visit: Admit: 2023-04-15 | Discharge: 2023-04-16 | Payer: MEDICARE

## 2023-04-15 DIAGNOSIS — L409 Psoriasis, unspecified: Principal | ICD-10-CM

## 2023-04-15 DIAGNOSIS — O99210 Obesity complicating pregnancy, unspecified trimester: Principal | ICD-10-CM

## 2023-04-15 DIAGNOSIS — O0991 Supervision of high risk pregnancy, unspecified, first trimester: Principal | ICD-10-CM

## 2023-04-15 DIAGNOSIS — Z6791 Unspecified blood type, Rh negative: Principal | ICD-10-CM

## 2023-04-15 DIAGNOSIS — F32A Depression during pregnancy in third trimester: Principal | ICD-10-CM

## 2023-04-15 DIAGNOSIS — Z818 Family history of other mental and behavioral disorders: Principal | ICD-10-CM

## 2023-04-15 DIAGNOSIS — G932 Benign intracranial hypertension: Principal | ICD-10-CM

## 2023-04-15 DIAGNOSIS — I10 Essential (primary) hypertension: Principal | ICD-10-CM

## 2023-04-15 DIAGNOSIS — O99513 Diseases of the respiratory system complicating pregnancy, third trimester: Principal | ICD-10-CM

## 2023-04-15 DIAGNOSIS — E039 Hypothyroidism, unspecified: Principal | ICD-10-CM

## 2023-04-15 DIAGNOSIS — O99343 Other mental disorders complicating pregnancy, third trimester: Principal | ICD-10-CM

## 2023-04-15 DIAGNOSIS — Z8759 Personal history of other complications of pregnancy, childbirth and the puerperium: Principal | ICD-10-CM

## 2023-04-15 DIAGNOSIS — O26891 Other specified pregnancy related conditions, first trimester: Principal | ICD-10-CM

## 2023-04-15 DIAGNOSIS — J45909 Unspecified asthma, uncomplicated: Principal | ICD-10-CM

## 2023-04-15 DIAGNOSIS — O99283 Endocrine, nutritional and metabolic diseases complicating pregnancy, third trimester: Principal | ICD-10-CM

## 2023-04-15 MED ORDER — ASPIRIN 81 MG TABLET,DELAYED RELEASE
ORAL_TABLET | Freq: Every day | ORAL | 9 refills | 30.00 days | Status: CP
Start: 2023-04-15 — End: ?

## 2023-04-19 ENCOUNTER — Ambulatory Visit: Admit: 2023-04-19 | Discharge: 2023-04-20 | Payer: MEDICARE

## 2023-04-19 DIAGNOSIS — O163 Unspecified maternal hypertension, third trimester: Principal | ICD-10-CM

## 2023-04-19 DIAGNOSIS — O99343 Other mental disorders complicating pregnancy, third trimester: Principal | ICD-10-CM

## 2023-04-19 DIAGNOSIS — O34219 Maternal care for unspecified type scar from previous cesarean delivery: Principal | ICD-10-CM

## 2023-04-19 DIAGNOSIS — I10 Essential (primary) hypertension: Principal | ICD-10-CM

## 2023-04-19 DIAGNOSIS — Z1389 Encounter for screening for other disorder: Principal | ICD-10-CM

## 2023-04-19 DIAGNOSIS — G932 Benign intracranial hypertension: Principal | ICD-10-CM

## 2023-04-19 DIAGNOSIS — F32A Depression during pregnancy in third trimester: Principal | ICD-10-CM

## 2023-04-19 DIAGNOSIS — Z818 Family history of other mental and behavioral disorders: Principal | ICD-10-CM

## 2023-04-19 DIAGNOSIS — O0991 Supervision of high risk pregnancy, unspecified, first trimester: Principal | ICD-10-CM

## 2023-04-19 DIAGNOSIS — Z8759 Personal history of other complications of pregnancy, childbirth and the puerperium: Principal | ICD-10-CM

## 2023-04-21 ENCOUNTER — Ambulatory Visit: Admit: 2023-04-21 | Discharge: 2023-04-21 | Payer: MEDICARE

## 2023-04-21 ENCOUNTER — Inpatient Hospital Stay: Admit: 2023-04-21 | Discharge: 2023-04-21 | Payer: MEDICARE

## 2023-04-25 DIAGNOSIS — B951 Streptococcus, group B, as the cause of diseases classified elsewhere: Secondary | ICD-10-CM | POA: Insufficient documentation

## 2023-04-26 ENCOUNTER — Ambulatory Visit: Admit: 2023-04-26 | Discharge: 2023-04-27 | Payer: MEDICARE

## 2023-04-26 DIAGNOSIS — O0991 Supervision of high risk pregnancy, unspecified, first trimester: Principal | ICD-10-CM

## 2023-04-26 DIAGNOSIS — Z8759 Personal history of other complications of pregnancy, childbirth and the puerperium: Principal | ICD-10-CM

## 2023-04-26 DIAGNOSIS — O34219 Maternal care for unspecified type scar from previous cesarean delivery: Principal | ICD-10-CM

## 2023-04-26 DIAGNOSIS — B951 Streptococcus, group B, as the cause of diseases classified elsewhere: Principal | ICD-10-CM

## 2023-04-26 DIAGNOSIS — Z818 Family history of other mental and behavioral disorders: Principal | ICD-10-CM

## 2023-04-26 DIAGNOSIS — I10 Essential (primary) hypertension: Principal | ICD-10-CM

## 2023-04-26 DIAGNOSIS — O99210 Obesity complicating pregnancy, unspecified trimester: Principal | ICD-10-CM

## 2023-04-26 DIAGNOSIS — G932 Benign intracranial hypertension: Principal | ICD-10-CM

## 2023-04-30 ENCOUNTER — Ambulatory Visit: Admit: 2023-04-30 | Discharge: 2023-05-01 | Payer: MEDICARE

## 2023-04-30 ENCOUNTER — Ambulatory Visit: Admit: 2023-04-30 | Discharge: 2023-05-01

## 2023-04-30 ENCOUNTER — Inpatient Hospital Stay: Admit: 2023-04-30 | Discharge: 2023-05-01 | Payer: MEDICARE

## 2023-05-02 ENCOUNTER — Ambulatory Visit: Admit: 2023-05-02 | Discharge: 2023-05-03 | Payer: MEDICARE | Attending: Family | Primary: Family

## 2023-05-02 DIAGNOSIS — Z8709 Personal history of other diseases of the respiratory system: Secondary | ICD-10-CM | POA: Insufficient documentation

## 2023-05-02 DIAGNOSIS — K200 Eosinophilic esophagitis: Secondary | ICD-10-CM | POA: Insufficient documentation

## 2023-05-02 DIAGNOSIS — R440 Auditory hallucinations: Secondary | ICD-10-CM | POA: Insufficient documentation

## 2023-05-02 DIAGNOSIS — Z8614 Personal history of Methicillin resistant Staphylococcus aureus infection: Secondary | ICD-10-CM | POA: Insufficient documentation

## 2023-05-02 DIAGNOSIS — N6019 Diffuse cystic mastopathy of unspecified breast: Secondary | ICD-10-CM | POA: Insufficient documentation

## 2023-05-02 DIAGNOSIS — R4189 Other symptoms and signs involving cognitive functions and awareness: Secondary | ICD-10-CM | POA: Insufficient documentation

## 2023-05-02 DIAGNOSIS — J341 Cyst and mucocele of nose and nasal sinus: Secondary | ICD-10-CM | POA: Insufficient documentation

## 2023-05-02 DIAGNOSIS — Z8659 Personal history of other mental and behavioral disorders: Secondary | ICD-10-CM | POA: Insufficient documentation

## 2023-05-02 DIAGNOSIS — G629 Polyneuropathy, unspecified: Secondary | ICD-10-CM | POA: Insufficient documentation

## 2023-05-02 DIAGNOSIS — L732 Hidradenitis suppurativa: Secondary | ICD-10-CM | POA: Insufficient documentation

## 2023-05-02 DIAGNOSIS — J302 Other seasonal allergic rhinitis: Secondary | ICD-10-CM | POA: Insufficient documentation

## 2023-05-02 DIAGNOSIS — I890 Lymphedema, not elsewhere classified: Secondary | ICD-10-CM | POA: Insufficient documentation

## 2023-05-02 DIAGNOSIS — S01332A Puncture wound without foreign body of left ear, initial encounter: Principal | ICD-10-CM

## 2023-05-02 DIAGNOSIS — L089 Local infection of the skin and subcutaneous tissue, unspecified: Principal | ICD-10-CM

## 2023-05-02 DIAGNOSIS — S01331A Puncture wound without foreign body of right ear, initial encounter: Principal | ICD-10-CM

## 2023-05-02 MED ORDER — CEPHALEXIN 500 MG CAPSULE
ORAL_CAPSULE | Freq: Three times a day (TID) | ORAL | 0 refills | 7 days | Status: CP
Start: 2023-05-02 — End: 2023-05-09

## 2023-05-02 MED ORDER — MUPIROCIN 2 % TOPICAL OINTMENT
Freq: Three times a day (TID) | TOPICAL | 0 refills | 7 days | Status: CP
Start: 2023-05-02 — End: 2023-05-09

## 2023-05-03 ENCOUNTER — Ambulatory Visit: Admit: 2023-05-03 | Discharge: 2023-05-03 | Payer: MEDICARE

## 2023-05-03 ENCOUNTER — Inpatient Hospital Stay: Admit: 2023-05-03 | Discharge: 2023-05-03 | Payer: MEDICARE

## 2023-05-03 DIAGNOSIS — O409XX0 Polyhydramnios, unspecified trimester, not applicable or unspecified: Principal | ICD-10-CM

## 2023-05-03 DIAGNOSIS — Z8759 Personal history of other complications of pregnancy, childbirth and the puerperium: Principal | ICD-10-CM

## 2023-05-03 DIAGNOSIS — B951 Streptococcus, group B, as the cause of diseases classified elsewhere: Principal | ICD-10-CM

## 2023-05-03 DIAGNOSIS — O0991 Supervision of high risk pregnancy, unspecified, first trimester: Principal | ICD-10-CM

## 2023-05-03 DIAGNOSIS — Z818 Family history of other mental and behavioral disorders: Principal | ICD-10-CM

## 2023-05-03 DIAGNOSIS — I10 Essential (primary) hypertension: Principal | ICD-10-CM

## 2023-05-03 DIAGNOSIS — O288 Other abnormal findings on antenatal screening of mother: Principal | ICD-10-CM

## 2023-05-04 ENCOUNTER — Inpatient Hospital Stay: Admit: 2023-05-04 | Discharge: 2023-05-05 | Payer: MEDICARE

## 2023-05-04 ENCOUNTER — Ambulatory Visit: Admit: 2023-05-04 | Discharge: 2023-05-05

## 2023-05-05 ENCOUNTER — Encounter: Admit: 2023-05-05 | Discharge: 2023-05-06 | Payer: MEDICARE

## 2023-05-05 DIAGNOSIS — O99210 Obesity complicating pregnancy, unspecified trimester: Principal | ICD-10-CM

## 2023-05-09 ENCOUNTER — Inpatient Hospital Stay: Admit: 2023-05-09 | Discharge: 2023-05-10

## 2023-05-09 ENCOUNTER — Ambulatory Visit: Admit: 2023-05-09 | Discharge: 2023-05-10 | Attending: Registered Nurse | Primary: Registered Nurse

## 2023-05-09 DIAGNOSIS — O368131 Decreased fetal movements, third trimester, fetus 1: Principal | ICD-10-CM

## 2023-05-09 DIAGNOSIS — O0991 Supervision of high risk pregnancy, unspecified, first trimester: Principal | ICD-10-CM

## 2023-05-09 DIAGNOSIS — Z8759 Personal history of other complications of pregnancy, childbirth and the puerperium: Principal | ICD-10-CM

## 2023-05-09 DIAGNOSIS — Z818 Family history of other mental and behavioral disorders: Principal | ICD-10-CM

## 2023-05-09 DIAGNOSIS — I10 Essential (primary) hypertension: Principal | ICD-10-CM

## 2023-05-09 DIAGNOSIS — B951 Streptococcus, group B, as the cause of diseases classified elsewhere: Principal | ICD-10-CM

## 2023-05-12 ENCOUNTER — Encounter: Admit: 2023-05-12 | Discharge: 2023-05-15 | Disposition: A | Discharge status: Home / Self Care

## 2023-05-12 ENCOUNTER — Ambulatory Visit: Admit: 2023-05-12 | Discharge: 2023-05-15 | Disposition: A | Discharge status: Home / Self Care

## 2023-05-12 ENCOUNTER — Inpatient Hospital Stay: Admit: 2023-05-12 | Discharge: 2023-05-15 | Disposition: A | Discharge status: Home / Self Care | Payer: MEDICARE

## 2023-05-15 MED ORDER — IBUPROFEN 600 MG TABLET
ORAL_TABLET | Freq: Four times a day (QID) | ORAL | 0 refills | 23.00 days | Status: CP
Start: 2023-05-15 — End: ?
  Filled 2023-05-17: qty 90, 12d supply, fill #0

## 2023-05-15 MED ORDER — OXYCODONE 5 MG TABLET
ORAL_TABLET | Freq: Four times a day (QID) | ORAL | 0 refills | 2.00 days | Status: CP | PRN
Start: 2023-05-15 — End: 2023-05-20
  Filled 2023-05-17: qty 8, 2d supply, fill #0

## 2023-05-15 MED ORDER — ACETAMINOPHEN 325 MG TABLET
ORAL_TABLET | Freq: Four times a day (QID) | ORAL | 0 refills | 12.00 days | Status: CP
Start: 2023-05-15 — End: ?

## 2023-05-16 MED ORDER — FUROSEMIDE 20 MG TABLET
ORAL_TABLET | Freq: Every day | ORAL | 0 refills | 2.00 days | Status: CP
Start: 2023-05-16 — End: 2023-05-18
  Filled 2023-05-17: qty 2, 2d supply, fill #0

## 2023-05-17 MED FILL — IBUPROFEN 600 MG TABLET: ORAL | 23 days supply | Qty: 90 | Fill #0

## 2023-05-19 MED ORDER — FUROSEMIDE 20 MG TABLET
ORAL_TABLET | Freq: Every day | ORAL | 0 refills | 2.00 days
Start: 2023-05-19 — End: 2023-05-21

## 2023-05-24 ENCOUNTER — Inpatient Hospital Stay: Admit: 2023-05-24 | Discharge: 2023-05-24 | Payer: MEDICAID

## 2023-05-24 DIAGNOSIS — Z8759 Personal history of other complications of pregnancy, childbirth and the puerperium: Principal | ICD-10-CM

## 2023-05-24 DIAGNOSIS — B951 Streptococcus, group B, as the cause of diseases classified elsewhere: Principal | ICD-10-CM

## 2023-05-24 DIAGNOSIS — I10 Essential (primary) hypertension: Principal | ICD-10-CM

## 2023-05-24 DIAGNOSIS — L089 Local infection of the skin and subcutaneous tissue, unspecified: Principal | ICD-10-CM

## 2023-05-24 DIAGNOSIS — Z818 Family history of other mental and behavioral disorders: Principal | ICD-10-CM

## 2023-05-24 DIAGNOSIS — O0991 Supervision of high risk pregnancy, unspecified, first trimester: Principal | ICD-10-CM

## 2023-05-24 MED ORDER — CEPHALEXIN 500 MG CAPSULE
ORAL_CAPSULE | Freq: Four times a day (QID) | ORAL | 0 refills | 10.00 days | Status: CP
Start: 2023-05-24 — End: 2023-06-03
  Filled 2023-06-03: qty 40, 10d supply, fill #0

## 2023-05-31 ENCOUNTER — Ambulatory Visit: Admit: 2023-05-31 | Payer: Medicaid (Managed Care)

## 2023-06-05 DIAGNOSIS — Z79899 Other long term (current) drug therapy: Principal | ICD-10-CM

## 2023-06-07 ENCOUNTER — Ambulatory Visit: Admit: 2023-06-07 | Discharge: 2023-06-08 | Payer: Medicaid (Managed Care)

## 2023-06-10 ENCOUNTER — Other Ambulatory Visit: Payer: Self-pay | Admitting: Cardiology

## 2023-06-13 DIAGNOSIS — L409 Psoriasis, unspecified: Principal | ICD-10-CM

## 2023-06-13 MED ORDER — CIMZIA 400 MG/2 ML (200 MG/ML X 2) SUBCUTANEOUS SYRINGE KIT
SUBCUTANEOUS | 1 refills | 28.00000 days | Status: CP
Start: 2023-06-13 — End: ?
  Filled 2023-07-13: qty 2, 28d supply, fill #0

## 2023-06-13 NOTE — Telephone Encounter (Signed)
 Medication sent using previous instructions: hydrALAZINE  (APRESOLINE ) 25 MG tablet Medication Date: 01/17/2023 Department: [facility] Ordering/Authorizing: Krasowski, Robert J, MD   Order Providers  Prescribing Provider Encounter Provider  Manfred Seed, MD Krasowski, Robert J, MD   Outpatient Medication Detail   Disp Refills Start End   hydrALAZINE  (APRESOLINE ) 25 MG tablet 720 tablet 0 01/17/2023 --   Sig: TAKE TWO TABLETS BY MOUTH FOUR TIMES DAILY   Sent to pharmacy as: hydrALAZINE  (APRESOLINE ) 25 MG tablet   Notes to Pharmacy: This prescription was filled on 01/17/2023. Any refills authorized will be placed on file.   E-Prescribing Status: Receipt confirmed by pharmacy (01/17/2023  5:01 PM EST)    Pharmacy

## 2023-06-18 IMAGING — CT CT HEAD W/O CM
3 of 4 series · 13 of 47 positions shown, 15 images · non-contrast
Comparison: Head CT 12/18/2020.

CLINICAL DATA: 25-year-old female with history of new onset of
headache. Left hand grip weakness.

EXAM:
CT HEAD WITHOUT CONTRAST
TECHNIQUE: Contiguous axial images were obtained from the base of the skull
through the vertex without intravenous contrast.

[Series 3: head without · axial · non-contrast · 0.47mm/px · z∈[-126,+8]mm · 7 of 37 slices shown, 9 images]
[im 5/37  brain]
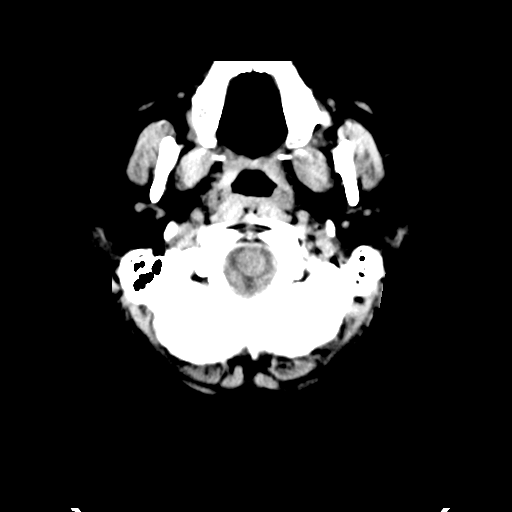
[im 5/37  bone]
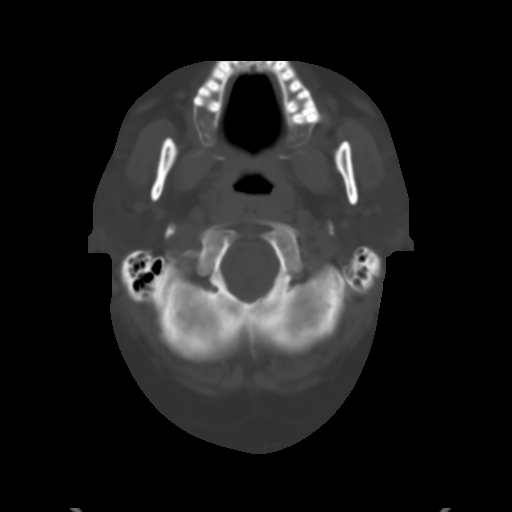
[im 10/37  brain]
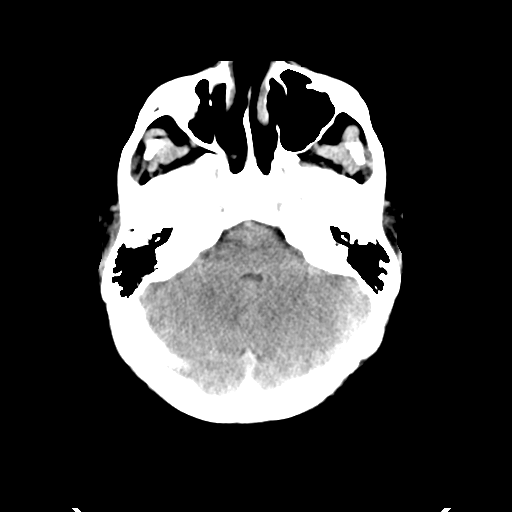
[im 14/37  brain]
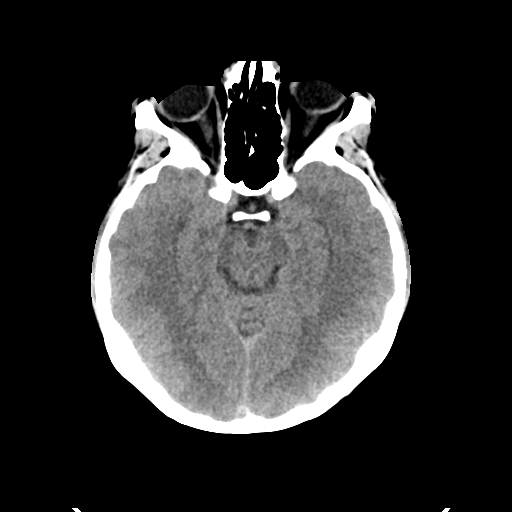
[im 19/37  brain]
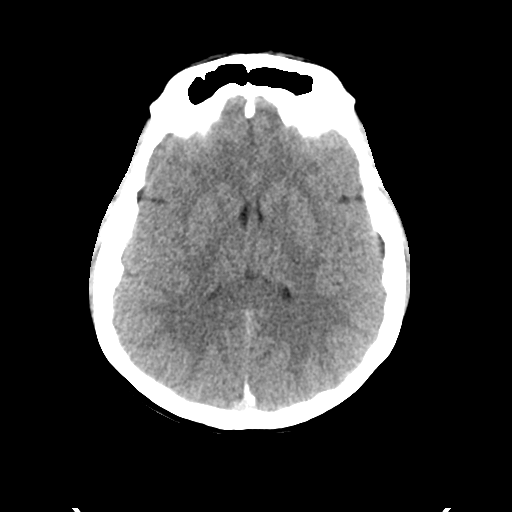
[im 23/37  brain]
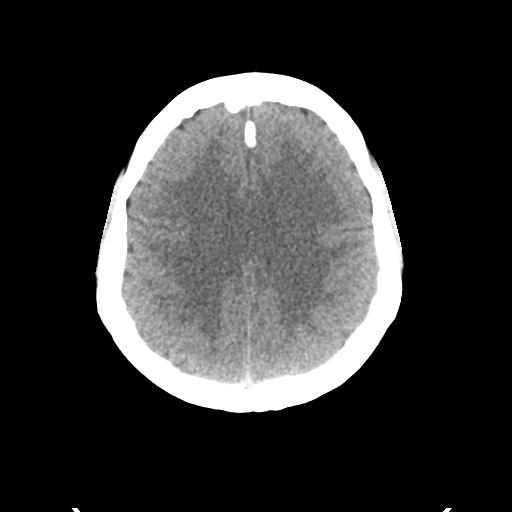
[im 23/37  bone]
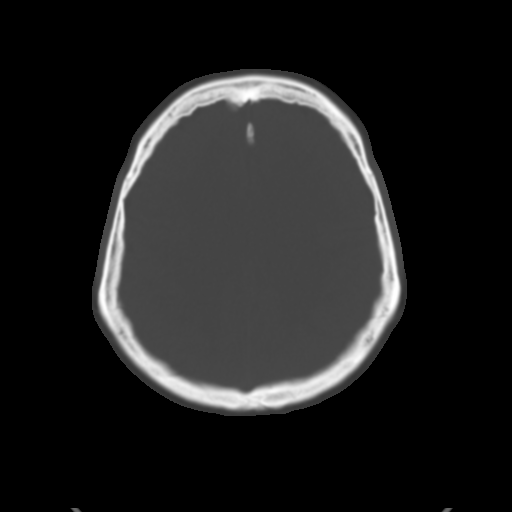
[im 28/37  brain]
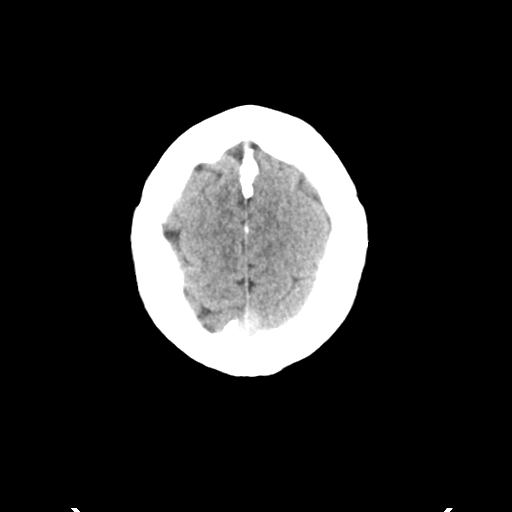
[im 32/37  brain]
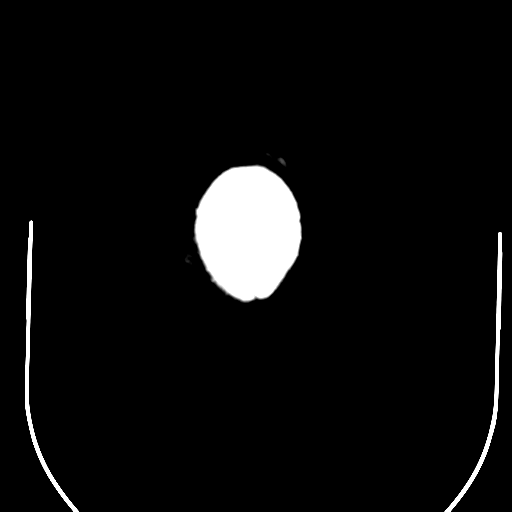

[Series 5: head without cor · coronal · non-contrast · 0.35mm/px · 3 of 70 slices shown]
[im 24/70  brain]
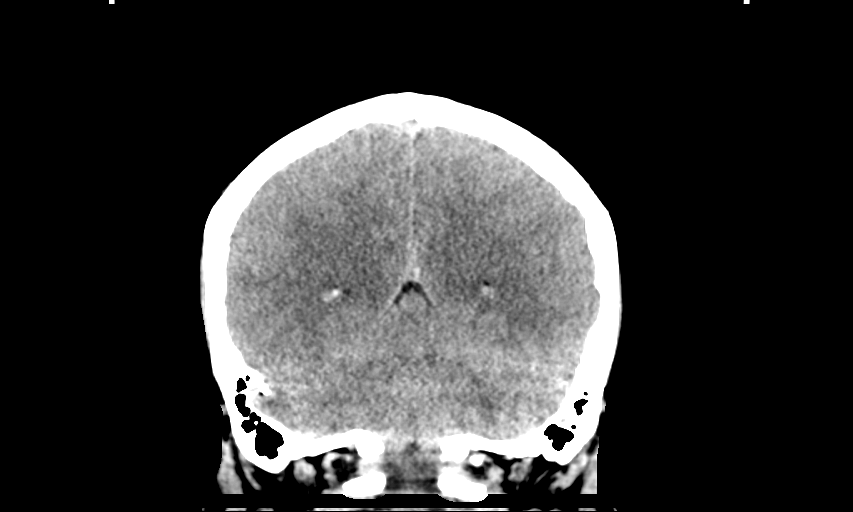
[im 31/70  brain]
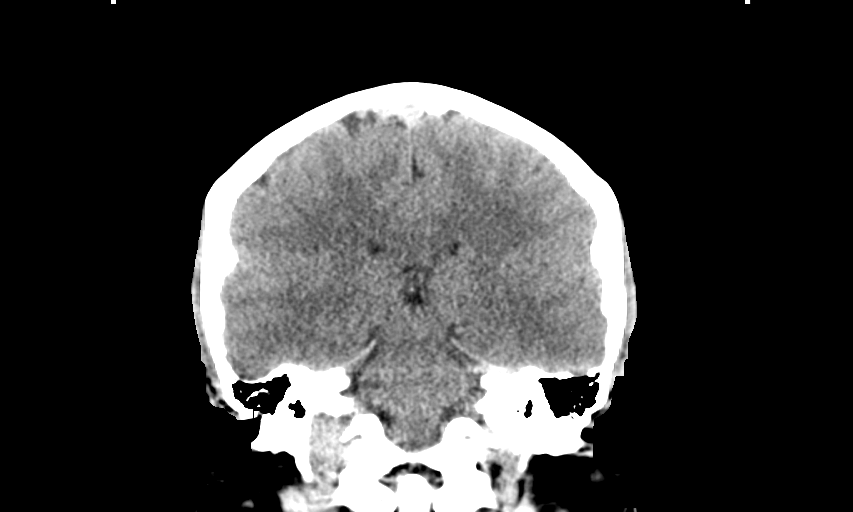
[im 39/70  brain]
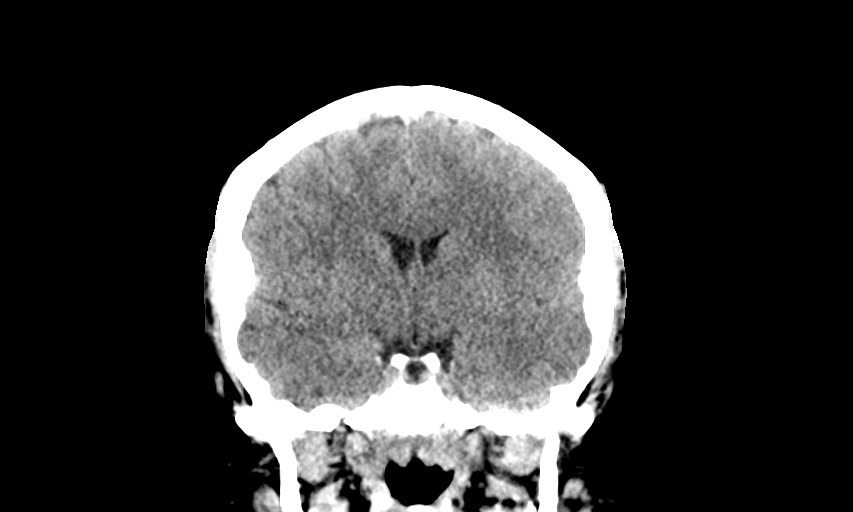

[Series 6: head without sag · sagittal · non-contrast · 0.35mm/px · 3 of 67 slices shown]
[im 23/67  brain]
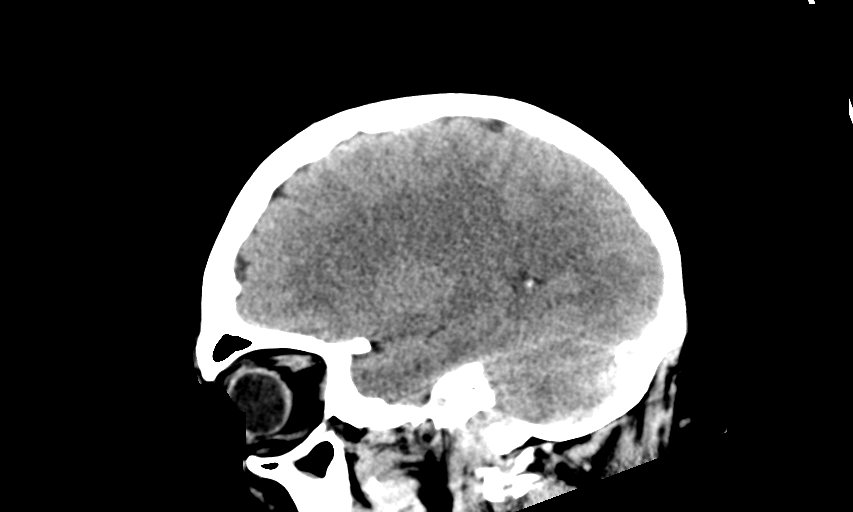
[im 34/67  brain]
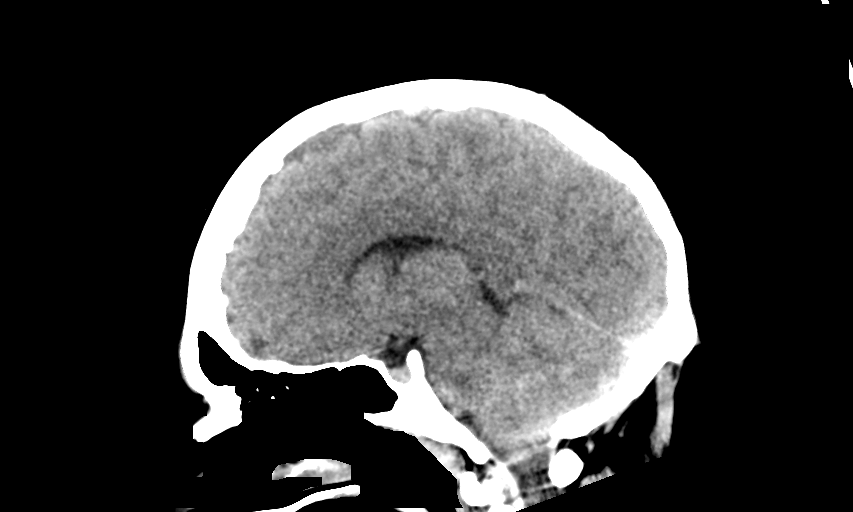
[im 45/67  brain]
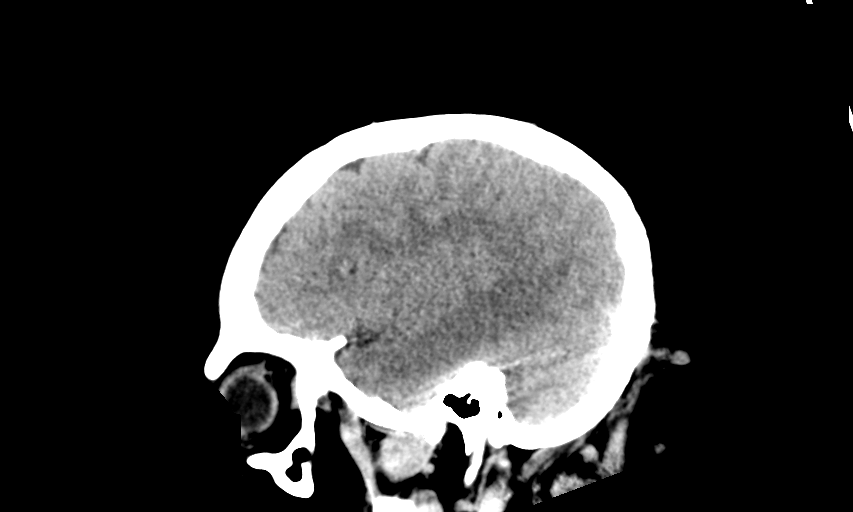

[13 of 47 positions shown; findings below may reference images not displayed]

FINDINGS: Brain: No evidence of acute infarction, hemorrhage, hydrocephalus,
extra-axial collection or mass lesion/mass effect.

Vascular: No hyperdense vessel or unexpected calcification.

Skull: Normal. Negative for fracture or focal lesion.

Sinuses/Orbits: No acute finding.

Other: None.
IMPRESSION: 1. No acute intracranial abnormalities. The appearance of the brain
is normal.

## 2023-06-29 ENCOUNTER — Ambulatory Visit: Admit: 2023-06-29 | Discharge: 2023-06-30 | Payer: Medicaid (Managed Care)

## 2023-06-29 DIAGNOSIS — O99210 Obesity complicating pregnancy, unspecified trimester: Principal | ICD-10-CM

## 2023-06-29 MED ORDER — LABETALOL 200 MG TABLET
ORAL_TABLET | Freq: Two times a day (BID) | ORAL | 11 refills | 30.00000 days | Status: CP
Start: 2023-06-29 — End: ?

## 2023-06-29 MED ORDER — NORETHINDRONE (CONTRACEPTIVE) 0.35 MG TABLET
ORAL_TABLET | Freq: Every day | ORAL | 11 refills | 30.00000 days | Status: CP
Start: 2023-06-29 — End: 2024-06-28

## 2023-06-29 MED ORDER — LEVOTHYROXINE 75 MCG TABLET
ORAL_TABLET | Freq: Every day | ORAL | 11 refills | 30.00000 days | Status: CP
Start: 2023-06-29 — End: ?

## 2023-07-08 ENCOUNTER — Ambulatory Visit: Admit: 2023-07-08 | Discharge: 2023-07-09 | Payer: Medicaid (Managed Care)

## 2023-07-08 ENCOUNTER — Ambulatory Visit
Admit: 2023-07-08 | Discharge: 2023-07-09 | Payer: Medicaid (Managed Care) | Attending: Physician Assistant | Primary: Physician Assistant

## 2023-07-08 DIAGNOSIS — G932 Benign intracranial hypertension: Principal | ICD-10-CM

## 2023-07-08 DIAGNOSIS — O99210 Obesity complicating pregnancy, unspecified trimester: Principal | ICD-10-CM

## 2023-07-08 DIAGNOSIS — E8881 Metabolic syndrome: Principal | ICD-10-CM

## 2023-07-08 DIAGNOSIS — D649 Anemia, unspecified: Principal | ICD-10-CM

## 2023-07-08 DIAGNOSIS — E66813 Class 3 obesity: Principal | ICD-10-CM

## 2023-07-08 MED ORDER — WEGOVY 0.25 MG/0.5 ML SUBCUTANEOUS PEN INJECTOR
SUBCUTANEOUS | 0 refills | 0.00000 days | Status: CP
Start: 2023-07-08 — End: ?

## 2023-07-11 NOTE — Unmapped (Signed)
 [facility] Specialty Medication Onboarding    Specialty Medication: CIMZIA 400 mg/2 mL (200 mg/mL x 2) Sykt injection syringe (certolizumab pegol)  Prior Authorization: Not Required   Financial Assistance: No - copay  <$25  Final Copay/Day Supply: $0 / 28    Insurance Restrictions: None     Notes to Pharmacist:   Credit Card on File: not applicable  Start Date on Rx:  06/13/23    The triage team has completed the benefits investigation and has determined that the patient is able to fill this medication at [facility]. Please contact the patient to complete the onboarding or follow up with the prescribing physician as needed.

## 2023-07-12 ENCOUNTER — Ambulatory Visit: Admit: 2023-07-12 | Discharge: 2023-07-13 | Payer: Medicaid (Managed Care)

## 2023-07-12 DIAGNOSIS — Z818 Family history of other mental and behavioral disorders: Principal | ICD-10-CM

## 2023-07-12 LAB — VITAMIN D 25 HYDROXY: VITAMIN D, TOTAL (25OH): 28.6 ng/mL (ref 20.0–80.0)

## 2023-07-12 MED ORDER — EMPTY CONTAINER
2 refills | 0.00000 days
Start: 2023-07-12 — End: ?

## 2023-07-12 NOTE — Unmapped (Signed)
 [facility]    Patient Onboarding/Medication Counseling    Tara Christensen is a 28 y.o. female with psoriasis who I am counseling today on continuation of therapy.  I am speaking to the patient.    Was a Nurse, learning disability used for this call? No    Verified patient's date of birth / HIPAA.    Specialty medication(s) to be sent: Inflammatory Disorders: Cimzia      Non-specialty medications/supplies to be sent: sharps kit      Medications not needed at this time: na         Cimzia (certolizumab pegol)    Medication & Administration     Dosage: Plaque psoriasis: Inject 400mg  under the skin every 2 weeks      Lab tests required prior to treatment initiation:  Tuberculosis: Tuberculosis screening resulted in a non-reactive Quantiferon TB Gold assay.  Hepatitis B: Hepatitis B serology studies are complete and non-reactive.      Administration:     Artist all supplies needed for injection on a clean, flat working surface: medication syringe(s) removed from packaging, alcohol swabs, sharps container, etc.  Look at the medication label - look for correct medication, correct dose, and check the expiration date  Look at the medication - the liquid in the syringe should appear clear and colorless to yellow  Lay the syringe on a flat surface and allow it to warm up to room temperature for at least 30-60 minutes  Select injection site - you can use the front of your thigh or your belly (but not the area 2 inches around your belly button)  Prepare injection site - wash your hands and clean the skin at the injection site with an alcohol swab and let it air dry, do not touch the injection site again before the injection  Pull off the needle safety cap by holding the syringe with the needle pointing up in one hand and with your other hand remove the plastic ring needle cap by pulling straight up on the plastic ring; do not remove until immediately prior to injection  Pinch the skin - with your hand not holding the syringe pinch up a fold of skin at the injection site using your forefinger and thumb  Insert the needle into the fold of skin at about a 45 degree angle - it's best to use a quick dart-like motion - with the syringe in position, release the pinch of skin and allow the skin to relax  Push the plunger down slowly as far as it will go until the syringe is empty  Check that the syringe is completely empty and pull the needle out at the same angle as inserted  Dispose of the used syringe immediately in your sharps disposal container, do not attempt to recap the needle prior to disposing  If you see any blood at the injection site, press a cotton ball or gauze on the site and maintain pressure until the bleeding stops, do not rub the injection site      Adherence/Missed dose instructions:  If your injection is given more than 4 days after your scheduled injection date - consult your pharmacist for additional instructions on how to adjust your dosing schedule.      Goals of Therapy       Plaque Psoriasis  Minimize areas of skin involvement (% BSA)  Avoidance of long term glucocorticoid use  Maintenance of effective psychosocial functioning      Side  Effects & Monitoring Parameters     Injection site reaction (redness, irritation, inflammation localized to the site of administration)  Signs of a common cold - minor sore throat, runny or stuffy nose, etc.    The following side effects should be reported to the provider:  Signs of a hypersensitivity reaction - rash; hives; itching; red, swollen, blistered, or peeling skin; wheezing; tightness in the chest or throat; difficulty breathing, swallowing, or talking; swelling of the mouth, face, lips, tongue, or throat; etc.  Reduced immune function - report signs of infection such as fever; chills; body aches; very bad sore throat; ear or sinus pain; cough; more sputum or change in color of sputum; pain with passing urine; wound that will not heal, etc.  Also at a slightly higher risk of some malignancies (mainly skin and blood cancers) due to this reduced immune function.  In the case of signs of infection - the patient should hold the next dose of Cimzia?? and call your primary care provider to ensure adequate medical care.  Treatment may be resumed when infection is treated and patient is asymptomatic.  Signs of lupus like a rash on the cheeks or other body parts, skin photosensitivity, etc.  Any mood changes or thoughts of harming yourself      Warnings, Precautions, & Contraindications     Have your bloodwork checked as you have been told by your prescriber  Talk with your doctor if you are pregnant, planning to become pregnant, or breastfeeding  Discuss the possible need for holding your dose(s) of Cimzia?? when a planned procedure is scheduled with the prescriber as it may delay healing/recovery timeline       Drug/Food Interactions     Medication list reviewed in Epic. The patient was instructed to inform the care team before taking any new medications or supplements. No drug interactions identified.   If you have a latex allergy use caution when handling, the needle shield inside the removable cap of the Cimzia?? prefilled syringe contains a derivative of natural rubber latex  Talk with you prescriber or pharmacist before receiving any live vaccinations while taking this medication and after you stop taking it    Storage, Handling Precautions, & Disposal     Store this medication in the refrigerator.  Do not freeze  If needed, you may store at room temperature for up to 7 days  Store in original packaging, protected from light  Do not shake  Dispose of used syringes/pens in a sharps disposal container          Current Medications (including OTC/herbals), Comorbidities and Allergies     Current Outpatient Medications   Medication Sig Dispense Refill    acetaminophen (TYLENOL) 325 MG tablet Take 2 tablets (650 mg total) by mouth every six (6) hours. (Patient not taking: Reported on 07/08/2023) 90 tablet 0    acetazolamide (DIAMOX) 500 mg ER 12 hr capsule Take 3 capsules (1,500 mg total) by mouth Three (3) times a day. 180 capsule 6    aspirin (ECOTRIN) 81 MG tablet Take 1 tablet (81 mg total) by mouth daily.      blood pressure monitor Kit 46cm arm circumference    [address]   apt d   [place] [address]      Medicaid #-[id number] T 1 kit 0    buPROPion (WELLBUTRIN XL) 150 MG 24 hr tablet Take 3 tablets (450 mg total) by mouth every morning. (Patient taking differently: Take 1 tablet (150 mg  total) by mouth every morning.) 90 tablet 4    buPROPion (WELLBUTRIN XL) 300 MG 24 hr tablet Take 1 tablet (300 mg total) by mouth daily.      CIMZIA 400 mg/2 mL (200 mg/mL x 2) SyKt injection syringe Inject the contents of 2 syringes (400 mg total) under the skin every fourteen (14) days. 2 each 1    cyclobenzaprine (FLEXERIL) 10 MG tablet Take 1 tablet (10 mg total) by mouth nightly. TAKE 1 TABLET (10 MG TOTAL) BY MOUTH NIGHTLY.      DERMA-SMOOTHE/FS SCALP OIL 0.01 % external oil APPLY TO SCALP THREE nights PER WEEK AS NEEDED      docusate sodium (COLACE) 100 MG capsule Take 1 capsule (100 mg total) by mouth two (2) times a day. 60 capsule 11    ferrous sulfate 325 (65 FE) MG EC tablet Take 1 tablet (325 mg total) by mouth in the morning. (Patient not taking: Reported on 07/08/2023) 30 tablet 11    furosemide (LASIX) 20 MG tablet Take 1 tablet (20 mg total) by mouth daily for 2 days. 2 tablet 0    hydrALAZINE (APRESOLINE) 25 MG tablet Take 2 tablets (50 mg total) by mouth four (4) times a day.      ibuprofen (MOTRIN) 600 MG tablet Take 1 tablet (600 mg total) by mouth every six (6) hours. (Patient not taking: Reported on 07/08/2023) 90 tablet 0    labetalol (NORMODYNE) 200 MG tablet Take 3 tablets (600 mg total) by mouth two (2) times a day. 180 tablet 11    levothyroxine (SYNTHROID) 75 MCG tablet Take 1 tablet (75 mcg total) by mouth daily. 30 tablet 11    magnesium oxide (MAG-OX) 400 mg (241.3 mg elemental magnesium) tablet Take 1 tablet by mouth daily.      montelukast (SINGULAIR) 10 mg tablet Take 1 tablet (10 mg total) by mouth nightly. 30 tablet 0    multivitamin, prenatal, folic acid-iron, (PRENATAL VITAMIN PLUS LOW IRON) 27-1 mg Tab Take 1 tablet by mouth daily. 30 tablet 4    norethindrone (MICRONOR) 0.35 mg tablet Take 1 tablet by mouth daily. 30 tablet 11    pantoprazole (PROTONIX) 40 MG tablet Take 1 tablet (40 mg total) by mouth daily.      sertraline (ZOLOFT) 100 MG tablet Take 2 and 1/2 tablets (250 mg total) by mouth daily. 75 tablet 3    simethicone (MYLICON) 80 MG chewable tablet Chew 1 tablet (80 mg total) every six (6) hours as needed for flatulence.      solifenacin (VESICARE) 5 MG tablet Take 1 tablet (5 mg total) by mouth every morning.      WEGOVY 0.25 MG/0.5 ML SUBCUTANEOUS PEN INJECTOR Inject 0.25 mg under the skin every seven (7) days. 2 mL 0     No current facility-administered medications for this visit.       Allergies   Allergen Reactions    Adhesive Rash     Other reaction(s): Rash   Other reaction(s): Rash   Other reaction(s): Rash    Other reaction(s): RashOther reaction(s): RashOther reaction(s): Rash    Adhesive Tape-Silicones Rash     Other reaction(s): Rash Other reaction(s): Rash Other reaction(s): Rash  Other reaction(s): RashOther reaction(s): RashOther reaction(s): Rash      Other reaction(s): Rash, Other reaction(s): Rash, Other reaction(s): Rash    Etanercept Nausea And Vomiting     Unsure re reaction    Latex Hives    Mold Other (See Comments) and  Anaphylaxis     allergy test     allergy test    Other Anaphylaxis, Other (See Comments) and Rash     Other reaction(s): Rash    Must wear at least sterling silver jewelry    Other reaction(s): Rash   Must wear at least sterling silver jewelry   Other reaction(s): Rash   Must wear at least sterling silver jewelry   Other reaction(s): Other (See Comments)   Other reaction(s): Rash   Must wear at least sterling silver jewelry    Other reaction(s): Other (See Comments)   Other reaction(s): Rash   Must wear at least sterling silver jewelry    Other reaction(s): Other (See Comments)   Other reaction(s): Rash   Must wear at least sterling silver jewelry   Other reaction(s): Rash   Must wear at least sterling silver jewelry   Other reaction(s): Rash   Must wear at least sterling silver jewelry   Other reaction(s): Other (See Comments)   Other reaction(s): Rash   Must wear at least sterling silver jewelry    Other reaction(s): Other (See Comments)Other reaction(s): RashMust wear at least sterling silver jewelryOther reaction(s): RashMust wear at least sterling silver jewelryOther reaction(s): RashMust wear at least sterling silver jewelryOther reaction(s): Other (See Comments)Other reaction(s): RashMust wear at least sterling silver jewelry    Other reaction(s): Rash Other reaction(s): Rash Other reaction(s): Rash  Other reaction(s): RashOther reaction(s): RashOther reaction(s): Rash  Other reaction(s): Rash, Other reaction(s): Rash, Other reaction(s): Rash    Tramadol Rash and Itching     Pt reports itching as well    Other reaction(s): Rash    Wound Dressings Rash     Other reaction(s): Rash Other reaction(s): Rash Other reaction(s): Rash  Other reaction(s): RashOther reaction(s): RashOther reaction(s): Rash  Other reaction(s): Rash, Other reaction(s): Rash, Other reaction(s): Rash    Ziprasidone Nausea And Vomiting     Unsure of reaction      Unsure of reaction  Unk reaction      Unsure of reaction  Unk reaction  Unsure of reaction    Unsure of reaction  Unk reaction      Unsure of reaction    Unknown reaction    Unsure of reaction  Unk reaction  Unsure of reaction      Unsure of reaction  Unsure of reaction  Unk reaction  Unsure of reaction  Unk reaction  Unsure of reaction      Unsure of reaction  Unk reaction    Ziprasidone Hcl      Unsure of reaction    Unk reaction    Ziprasidone Mesylate        Patient Active Problem List   Diagnosis    Episode of recurrent major depressive disorder    Obstructive sleep apnea    BMI >60    Psoriasis    Severe persistent asthma    Hypothyroidism affecting pregnancy in third trimester    Idiopathic intracranial hypertension    Previous CLASSICAL cesarean delivery    Family history of autism    Chronic hypertension    Positive GBS test    Anxiety    Bilateral carpal tunnel syndrome    Calculus of gallbladder with chronic 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    Chronic headaches    Cognitive impairment    Eosinophilic esophagitis    Fibrocystic breast changes    H/O urticaria    Continuous auditory hallucinations    Hidradenitis suppurativa    History of asthma  History of manic depressive disorder    History of MRSA infection    Iron deficiency anemia    Lithium toxicity    Metabolic syndrome    Lymphedema of both lower extremities    Neuropathy    Retention cyst of nasal sinus    Seasonal allergies    Suicidal ideation    Encounter for procreative genetic counseling       Medication list has been reviewed and updated in Epic: Yes    Allergies have been reviewed and updated in Epic: Yes    Appropriateness of Therapy     Acute infections noted within Epic:  No active infections  Patient reported infection: None    Is the medication and dose appropriate based on diagnosis, medication list, comorbidities, allergies, medical history, patient???s ability to self-administer the medication, and therapeutic goals? Yes    Prescription has been clinically reviewed: Yes      Baseline Quality of Life Assessment      How many days over the past month did your psoriasis  keep you from your normal activities? For example, brushing your teeth or getting up in the morning. Patient declined to answer    Financial Information     Medication Assistance provided: Prior Authorization    Anticipated copay of $0 reviewed with patient. Verified delivery address.    Delivery Information     Scheduled delivery date: 6/5    Expected start date: 6/5      Medication will be delivered via UPS to the prescription address in Epic [place].  This shipment will not require a signature.      Explained the services we provide at [facility] and that each month we would call to set up refills.  Stressed importance of returning phone calls so that we could ensure they receive their medications in time each month.  Informed patient that we should be setting up refills 7-10 days prior to when they will run out of medication.  A pharmacist will reach out to perform a clinical assessment periodically.  Informed patient that a welcome packet, containing information about our pharmacy and other support services, a Notice of Privacy Practices, and a drug information handout will be sent.      The patient or caregiver noted above participated in the development of this care plan and knows that they can request review of or adjustments to the care plan at any time.      Patient or caregiver verbalized understanding of the above information as well as how to contact the pharmacy at [phone] option 4 with any questions/concerns.  The pharmacy is open Monday through Friday 8:30am-4:30pm.  A pharmacist is available 24/7 via pager to answer any clinical questions they may have.    Patient Specific Needs     Does the patient have any physical, cognitive, or cultural barriers? No    Does the patient have adequate living arrangements? (i.e. the ability to store and take their medication appropriately) Yes    Did you identify any home environmental safety or security hazards? No    Patient prefers to have medications discussed with  Patient     Is the patient or caregiver able to read and understand education materials at a high school level or above? Yes    Patient's primary language is  English     Is the patient high risk? No    Does the patient have an additional or emergency contact listed in  their chart? Yes    SOCIAL DETERMINANTS OF HEALTH     At the [facility], we have learned that life circumstances - like trouble affording food, housing, utilities, or transportation can affect the health of many of our patients.   That is why we wanted to ask: are you currently experiencing any life circumstances that are negatively impacting your health and/or quality of life? Patient declined to answer    Social Drivers of Health     Food Insecurity: No Food Insecurity (05/14/2023)    Hunger Vital Sign     Worried About Running Out of Food in the Last Year: Never true     Ran Out of Food in the Last Year: Never true   Tobacco Use: Low Risk  (07/08/2023)    Patient History     Smoking Tobacco Use: Never     Smokeless Tobacco Use: Never     Passive Exposure: Not on file   Transportation Needs: No Transportation Needs (05/14/2023)    PRAPARE - Transportation     Lack of Transportation (Medical): No     Lack of Transportation (Non-Medical): No   Alcohol Use: Not At Risk (04/08/2022)    Received from [facility], [facility]    AUDIT-C     Frequency of Alcohol Consumption: Patient declined     Average Number of Drinks: Patient does not drink     Frequency of Binge Drinking: Never   Housing: Low Risk  (05/14/2023)    Housing     Within the past 12 months, have you ever stayed: outside, in a car, in a tent, in an overnight shelter, or temporarily in someone else's home (i.e. couch-surfing)?: No     Are you worried about losing your housing?: No   Physical Activity: Insufficiently Active (04/08/2022)    Received from [facility], [facility]    Exercise Vital Sign     Days of Exercise per Week: 3 days     Minutes of Exercise per Session: 20 min   Utilities: Low Risk  (10/25/2022)    Received from [facility]    Utilities     In the past 12 months has the electric, gas, oil, or water company threatened to shut off services in your home? : No   Stress: Stress Concern Present (04/08/2022)    Received from [facility], [facility] - Occupational Stress Questionnaire     Feeling of Stress : To some extent   Interpersonal Safety: Not on file   Substance Use: Not on file (12/13/2022)   Intimate Partner Violence: Not At Risk (06/25/2022)    Received from [place] [facility], [facility]    HITS     Over the last 12 months how often did your partner physically hurt you?: Never     Over the last 12 months how often did your partner insult you or talk down to you?: Never     Over the last 12 months how often did your partner threaten you with physical harm?: Never     Over the last 12 months how often did your partner scream or curse at you?: Never   Social Connections: Socially Integrated (04/08/2022)    Received from [facility], [facility]    Social Network     How would you rate your social network (family, work, friends)?: Good participation with social networks   Financial Resource Strain: Low Risk  (05/14/2023)  Overall Financial Resource Strain (CARDIA)     Difficulty of Paying Living Expenses: Not very hard   Health Literacy: Not on file   Internet Connectivity: Not on file       Would you be willing to receive help with any of the needs that you have identified today? Not applicable       Betzaira Mentel [facility] Specialty Pharmacist

## 2023-07-12 NOTE — Unmapped (Signed)
 Lab(s) obtained per provider order via peripheral stick with 23g butterfly in right hand. Attempted x 2. Pt tolerated well. Specimen sent to lab via courier.

## 2023-07-13 ENCOUNTER — Ambulatory Visit: Payer: MEDICAID | Attending: Cardiology | Admitting: Cardiology

## 2023-07-13 ENCOUNTER — Encounter: Payer: Self-pay | Admitting: Cardiology

## 2023-07-13 VITALS — BP 142/96 | HR 68 | Ht 63.0 in | Wt 379.0 lb

## 2023-07-13 DIAGNOSIS — G4733 Obstructive sleep apnea (adult) (pediatric): Secondary | ICD-10-CM | POA: Insufficient documentation

## 2023-07-13 DIAGNOSIS — K760 Fatty (change of) liver, not elsewhere classified: Secondary | ICD-10-CM | POA: Diagnosis present

## 2023-07-13 DIAGNOSIS — I10 Essential (primary) hypertension: Secondary | ICD-10-CM | POA: Diagnosis present

## 2023-07-13 MED ORDER — FUROSEMIDE 20 MG PO TABS: 20.0000 mg | ORAL_TABLET | Freq: Every day | ORAL | 3 refills | Status: AC

## 2023-07-13 MED FILL — EMPTY CONTAINER: 120 days supply | Qty: 1 | Fill #0

## 2023-07-13 NOTE — Patient Instructions (Addendum)
 Medication Instructions:   Lasix  20mg  1 tablet daily   Lab Work: None Ordered If you have labs (blood work) drawn today and your tests are completely normal, you will receive your results only by: MyChart Message (if you have MyChart) OR A paper copy in the mail If you have any lab test that is abnormal or we need to change your treatment, we will call you to review the results.   Testing/Procedures: None Ordered   Follow-Up: At [facility], you and your health needs are our priority.  As part of our continuing mission to provide you with exceptional heart care, we have created designated Provider Care Teams.  These Care Teams include your primary Cardiologist (physician) and Advanced Practice Providers (APPs -  Physician Assistants and Nurse Practitioners) who all work together to provide you with the care you need, when you need it.  We recommend signing up for the patient portal called "MyChart".  Sign up information is provided on this After Visit Summary.  MyChart is used to connect with patients for Virtual Visits (Telemedicine).  Patients are able to view lab/test results, encounter notes, upcoming appointments, etc.  Non-urgent messages can be sent to your provider as well.   To learn more about what you can do with MyChart, go to [email or web address].    Your next appointment:   12 month(s)  The format for your next appointment:   In Person  Provider:   Ralene Burger, MD    Other Instructions NA

## 2023-07-14 LAB — NICOTINE SCREEN, URINE
ANABASINE, URINE: <2 ng/mL
COTININE, URINE: <5 ng/mL
NICOTINE, URINE: <5 ng/mL
NORNICOTINE, URINE: <2 ng/mL

## 2023-07-15 NOTE — Unmapped (Signed)
 Patient called emergency line - reported that she cannot see out of her right eye, left eye is unaffected. Started gradually after her last OB appointment and worsened today.     Recommended that she be evaluated by her nearest emergency room, will likely require opthalmology evaluation given monocular vision impairment which while can be a central process is concerning for ocular pathology.

## 2023-07-15 NOTE — Unmapped (Signed)
 Pt with hx of intra HTN and elevation of eye pressure  Sts not able to see from R eye

## 2023-07-16 ENCOUNTER — Emergency Department
Admit: 2023-07-16 | Discharge: 2023-07-16 | Disposition: A | Discharge status: Home / Self Care | Payer: Medicaid (Managed Care)

## 2023-07-16 MED ADMIN — acetaminophen (TYLENOL) tablet 1,000 mg: 1000 mg | ORAL | @ 06:00:00 | Stop: 2023-07-16

## 2023-07-16 MED ADMIN — prochlorperazine (COMPAZINE) injection 10 mg: 10 mg | INTRAMUSCULAR | @ 06:00:00 | Stop: 2023-07-16

## 2023-07-16 MED ADMIN — diphenhydrAMINE (BENADRYL) capsule/tablet 25 mg: 25 mg | ORAL | @ 06:00:00 | Stop: 2023-07-16

## 2023-07-16 NOTE — Unmapped (Signed)
 [facility]  Emergency Department Provider Note     ED Clinical Impression     Final diagnoses:   Vision loss (Primary)      HPI, Medical Decision Making, ED Course     Initial Clinical Impression:    July 16, 2023 12:26 AM   Tara Christensen is a 28 y.o. female with past medical history of IIH status post shunts, asthma, anxiety, depression presenting with vision changes.  Patient reports several days to possibly 1 to 2 weeks of vision loss of the right eye.  States that she is only able to see black from her right eye and endorses left eye blurry vision.  Admits right eye pain and headache.  Denies fever, cough, congestion, neck pain or stiffness, numbness, tingling, focal weakness, unsteady gait, or any other complaints.  She does have a history of IIH and is concerned that her shunt is malfunctioning.  No trauma to the eye.  No other complaints reported.    BP 138/99  - Pulse 83  - Temp 36.7 ??C (98 ??F) (Oral)  - Resp 17  - SpO2 97%     Patient is resting comfortably in the room and is in no acute distress.  Heart sounds regular.  Breath sounds clear.  Visual acuity 20/200 OS, only able to see black OD.  IOP 15  OS, 17 OD.  No periorbital edema or erythema.  Pupils are 3 mm and reactive to light bilaterally.  EOMI.  No pain with eye movements.  No tenderness over the temples.  Normal neurologic exam.    Medical Decision Making    Differential diagnosis worsening IIH, shunt malfunction, papilledema, eye pathology such as CRAO, CRVO, retinal detachment, vitreous hemorrhage, MS, optic neuritis, versus other etiology.  CVA also on the differential however patient has had vision loss for several days now and is outside of the window thus code stroke not called.  Pupils are equal and reactive and normal IOP bilaterally, do not suspect acute angle-closure glaucoma.  No tenderness over the temple, given age less likely to be temporal arteritis.  No evidence of infection on exam, no periorbital erythema or edema, no pain with eye movements thus I have a low suspicion for preseptal or orbital cellulitis.  No trauma to the eye, do not suspect globe rupture.  Plan for CT head to assess for ventriculomegaly or shunt malfunction/dislodgment.  If negative, will discuss with ophthalmology.    Further ED updates and updates to plan as per ED Course below:    ED Course:  ED Course as of 07/16/23 0610   Sat Jul 16, 2023   0414 CT head WO contrast  No acute intracranial abnormality.   8119 Ophthalmology paged and will see the patient.   1478 Patient is requesting be discharged at this time.  She has not been evaluated by ophthalmology.  I expressed concern and recommended that she stay to be evaluated by ophthalmology however she is adamant that she would like to be discharged home.  Recommend outpatient follow-up with ophthalmology as well as her PCP.  Strict return precautions were discussed and all questions were answered.  Patient verbalizes understanding of this and agrees with this plan.       Discussion of Management with other Physicians, QHP or Appropriate Source: Consultant - ophthalmology  Independent Interpretation of Studies: If applicable, documented in ED course above.  I have reviewed recent and relevant previous record, including: Discharge summary on/6/25, patient has a history of chronic  headaches, IIH status post shunt, asthma, anxiety, depression.  MRI brain on 05/01/2023 showed no acute intracranial abnormality, unchanged position of right-sided ventricular shunt.  Escalation of Care including OBS/Admission/Transfer was considered: However patient requested discharge.  Social Determinants that significantly affected care: N/A  Prescription drugs considered but not prescribed: N/A  Diagnostic tests considered but not performed: N/A  ____________________________________________    The case was discussed with the attending physician, who is in agreement with the above assessment and plan.     Additional History Elements Chief Complaint  Chief Complaint   Patient presents with    Eye Problem     Additional Historian(s): none available    Past Medical History:   Diagnosis Date    Anemia     Anxiety     Asthma     Bipolar 1 disorder       Depression     Disease of thyroid gland     Eczema     GBS bacteriuria 08/21/2021    PCN in labor    Headache     History of pre-eclampsia in prior pregnancy, currently pregnant     History of transfusion     Hypertension     IBS (irritable bowel syndrome)     IBS (irritable bowel syndrome)     Migraine     Nexplanon insertion 06/16/2021    Inserted 09/01/21     Obesity     Postpartum depression     Psoriasis     Rh negative state in antepartum period 05/31/2020    08/13/2021 rhogam given at [redacted]w[redacted]d     Self-injurious behavior     Sleep apnea, obstructive     Superimposed preeclampsia with severe features 05/16/2013    [x]  LDASA - On ASA for stent   [x]  baseline pre-e labs, if elevated spot of DM dx consider 24 hour urine  [x]  baseline EKG/TTE - followed by Cards, most recent echo 09/2020 with LVEF 55%  [x]  Fetal anatomic survey at 19-[redacted] weeks gestation  [x]  Serial growth ultrasounds every 4 weeks beginning at [redacted] weeks gestation if well controlled. If poorly controlled, recommend growth US  every 3-4 weeks from ti    Supervision of high risk pregnancy in third trimester 03/24/2021    Dating: L=6                                           First trimester:   [x]  Prenatal labs reviewed   [ ]  SMA/Hgb electrophoresis/CF    [x]  Early 1 hr GTT*   [x]  Baseline HELLP*  [x]  LDASA* - on ASA and plavix for venous stent   Third trimester:   [x]  CBC/RPR/HIV  [x]  1hr GTT  [x]  GBS+  [o] Anesthesia consult*  Fetal screening:    [x]  Anatomy US   [-] Fetal echo*  [o] Fetal growth US  every 4 weeks     Trauma     Urinary tract infection     more than 5 in lifetime    Varicella        Past Surgical History:   Procedure Laterality Date    AN ARTERIAL LINE  05/12/2023    BRAIN SURGERY  11/27/20    CHOLECYSTECTOMY      NASAL SEPTUM SURGERY      PR CESAREAN DELIVERY ONLY N/A 08/11/2020    Procedure: CESAREAN DELIVERY ONLY;  Surgeon: Allena Ito,  MD;  Location: L&D C-SECTION OR SUITES [facility];  Service: Maternal-Fetal Medicine    PR CESAREAN DELIVERY ONLY N/A 09/01/2021    Procedure: CESAREAN DELIVERY ONLY;  Surgeon: Adelia Adolphus, MD;  Location: L&D C-SECTION OR SUITES [facility];  Service: Obstetrics    PR CESAREAN DELIVERY ONLY N/A 05/12/2023    Procedure: CESAREAN DELIVERY ONLY;  Surgeon: Vernell Goldsmith, MD;  Location: L&D C-SECTION OR SUITES [facility];  Service: Obstetrics    PR DECOMPRESS OPTIC NERVE Bilateral 08/04/2020    Procedure: OPTIC NERV DECOMP;  Surgeon: Bea Lime, MD;  Location: MAIN OR [facility];  Service: Ophthalmology    TONSILLECTOMY AND ADENOIDECTOMY      WISDOM TOOTH EXTRACTION         Allergies  Adhesive, Adhesive tape-silicones, Etanercept, Latex, Mold, Other, Tramadol, Wound dressings, Ziprasidone, Ziprasidone hcl, and Ziprasidone mesylate    Family History  Family History   Problem Relation Age of Onset    Multiple sclerosis Mother     Heart disease Mother     Bipolar disorder Father     Diabetes Father     Hypertension Father     Diabetes Maternal Grandmother     Heart disease Maternal Grandmother     Hypertension Maternal Grandmother     Heart disease Maternal Grandfather         sudden death age 34    Diabetes Maternal Grandfather     Hypertension Maternal Grandfather     Hypertension Paternal Grandmother     Diabetes Paternal Grandmother     Diabetes Paternal Grandfather     Hypertension Paternal Grandfather     Cancer Other     Melanoma Neg Hx     Basal cell carcinoma Neg Hx     Squamous cell carcinoma Neg Hx     Glaucoma Neg Hx     Macular degeneration Neg Hx        Social History  Social History     Tobacco Use    Smoking status: Never    Smokeless tobacco: Never   Vaping Use    Vaping status: Never Used   Substance Use Topics    Alcohol use: Not Currently    Drug use: Never        Physical Exam     VITAL SIGNS:      Vitals:    07/15/23 2235 07/16/23 0019 07/16/23 0315 07/16/23 0537   BP:  146/103 155/92 138/99   Pulse: 111 92 80 83   Resp:  19 16 17    Temp:  36.6 ??C (97.9 ??F)  36.7 ??C (98 ??F)   TempSrc:    Oral   SpO2:  100% 97% 97%     Constitutional: Alert and oriented. No acute distress.  Eyes: Conjunctivae are normal.  Visual acuity 20/200 OS, only able to see black OD.  IOP 15  OS, 17 OD.  No periorbital edema or erythema.  Pupils are 3 mm and reactive to light bilaterally.  EOMI.  No pain with eye movements.  HEENT: Normocephalic and atraumatic. Conjunctivae clear. No congestion. Moist mucous membranes.   Cardiovascular: Rate as above, regular rhythm. Normal and symmetric distal pulses. Brisk capillary refill. Normal skin turgor.  Respiratory: Normal respiratory effort. Breath sounds are normal. There are no wheezing or crackles heard.  Gastrointestinal: Soft, non-distended, non-tender.  Genitourinary: Deferred.  Musculoskeletal: Non-tender with normal range of motion in all extremities.  Neurologic: Normal speech and language. No gross focal neurologic deficits are appreciated. Patient is  moving all extremities equally, face is symmetric at rest and with speech.  Skin: Skin is warm, dry and intact. No rash noted.  Psychiatric: Mood and affect are normal. Speech and behavior are normal.     Radiology     CT head WO contrast   Preliminary Result   No acute intracranial abnormality.             Pertinent labs & imaging results that were available during my care of the patient were independently interpreted by me and considered in my medical decision making (see chart for details).    Portions of this record have been created using Scientist, clinical (histocompatibility and immunogenetics). Dictation errors have been sought, but may not have been identified and corrected.     Brandy Cal, MD  Resident  07/16/23 [phone]

## 2023-07-18 DIAGNOSIS — Z789 Other specified health status: Principal | ICD-10-CM

## 2023-07-18 NOTE — Unmapped (Signed)
 Referral placed for community health worker team to reach out to patient regarding positive SDOH screening.

## 2023-07-20 NOTE — Progress Notes (Signed)
 Cardiology Office Note:    Date:  07/20/2023   ID:  Misty [name], DOB 05-04-1995, MRN [id number]  PCP:  Alveta Joiner, FNP  Cardiologist:  Ralene Burger, MD    Referring MD: Alveta Joiner, FNP   Chief Complaint  Patient presents with   Results    History of Present Illness:    Misty [name] is a 28 y.o. female past medical history significant for essential hypertension, morbid obesity with BMI 17, fatty liver, anxiety, bipolar.  Comes today 2 months for follow-up she is not doing well she just her third child, the same time she looks like separated with her husband.  She is traumatized by this.  She is actually looking for psychological support for that.  Denies have any chest pain tightness squeezing pressure burning chest blood pressure seems to be quite decently controlled  Past Medical History:  Diagnosis Date   Allergic rhinitis    Anxiety    Bipolar 1 disorder (HCC) 04/15/2017   Bipolar 1 disorder, depressed, severe (HCC) 12/16/2017   Bipolar I disorder (HCC) 10/10/2015   BMI 70 and over, adult (HCC) 12/03/2015   Borderline personality disorder (HCC) 03/14/2015   Depression    Fatty liver 10/10/2015   GAD (generalized anxiety disorder) 07/19/2012   Gastrointestinal disease 05/16/2013   GERD (gastroesophageal reflux disease)    H/O ASTHMA 10/08/2014   HTN (hypertension) 07/13/12   Hypertension 05/16/2013   IBS (irritable bowel syndrome)    Increased frequency of urination 04/12/2014   INTERMITTENT URTICARIA 10/08/2014   Intractable migraine without aura with status migrainosus 05/16/2013   Iron deficiency 10/10/2015   Iron deficiency anemia 12/11/2015   Lithium  toxicity 04/05/2016   Major depressive disorder, recurrent, 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   MDD (major depressive disorder) 11/18/2018   MDD (major depressive disorder), recurrent episode, moderate (HCC) 03/01/2012   MDD (major depressive disorder), recurrent episode, severe (HCC) 03/15/2019   Metabolic syndrome  09/04/2012   Morbid obesity (HCC) 04/03/2015   Obesity    Obesity affecting pregnancy in second trimester 04/11/2020   Obstructive sleep apnea 07/19/2012   ODD (oppositional defiant disorder) 07/19/2012   OSA on CPAP    Palpitations 07/21/2015   Posttraumatic stress disorder 09/04/2014   Primary hypertension 07/13/2012   Formatting of this note might be different from the original. Cardiac ECHO 07/23/15: Technically difficult study due to habitus Left ventricular cavity is normal in size. Normal diastolic filling pattern. Calculated  EF 71% Left atrial cavitiy mildly dilated Trace TR   Psoriasis    Schizoaffective disorder, depressive type (HCC) 03/23/2017   Sinus tachycardia 08/07/2014   Supervision of high risk pregnancy in second trimester 05/25/2020   Last Assessment & Plan:  Formatting of this note might be different from the original.  Discussed age related risk for fetal aneuploidy  Reviewed previous noninvasive prenatal screening attempts (Panorama through [facility])- no results obtained due to low fetal fraction  Discussed various reasons for low fetal fraction including increased maternal BMI, early gestational age, maternal use of an   Swelling of both lower extremities 12/14/2016   Urge incontinence of urine 04/12/2014    Past Surgical History:  Procedure Laterality Date   ADENOIDECTOMY     SINOSCOPY     TONSILLECTOMY AND ADENOIDECTOMY  Age 70   Deviated septum corrected at same time    Current Medications: Current Meds  Medication Sig   acetaZOLAMIDE ER (DIAMOX) 500 MG capsule Take 1,500 mg by mouth 2 (two) times  daily.   aspirin EC 81 MG tablet Take 81 mg by mouth daily. Swallow whole.   buPROPion  (WELLBUTRIN  XL) 300 MG 24 hr tablet Take 450 mg by mouth every morning.   CIMZIA 2 X 200 MG/ML PSKT Inject 400 mg into the skin every 14 (fourteen) days.   cyclobenzaprine (FLEXERIL) 10 MG tablet Take 10 mg by mouth at bedtime.   furosemide  (LASIX ) 20 MG tablet Take 1 tablet (20 mg  total) by mouth daily.   hydrALAZINE  (APRESOLINE ) 25 MG tablet Take 2 tablets (50 mg total) by mouth 4 (four) times daily. 1rst attempt, patient needs an appt for additional refills   labetalol (NORMODYNE) 300 MG tablet Take 900 mg by mouth 2 (two) times daily.   levothyroxine  (SYNTHROID ) 50 MCG tablet Take 1 tablet (50 mcg total) by mouth daily at 6 (six) AM. (Patient taking differently: Take 75 mcg by mouth daily at 6 (six) AM.)   MAGNESIUM  OXIDE 400 PO Take 1 tablet by mouth daily.   montelukast  (SINGULAIR ) 10 MG tablet Take 1 tablet (10 mg total) by mouth at bedtime.   NIFEdipine  (PROCARDIA  XL) 60 MG 24 hr tablet Take 1 tablet (60 mg total) by mouth daily.   norethindrone (MICRONOR) 0.35 MG tablet Take 1 tablet by mouth daily.   pantoprazole  (PROTONIX ) 40 MG tablet Take 40 mg by mouth daily.   Prenatal Vit-Fe Fumarate-FA (WESTAB PLUS PO) Take 1 tablet by mouth daily.   Semaglutide-Weight Management (WEGOVY) 0.25 MG/0.5ML SOAJ Inject 0.25 mg into the skin once a week. Not started, waiting on insurance   sertraline  (ZOLOFT ) 100 MG tablet Take 200 mg by mouth daily.   solifenacin (VESICARE) 5 MG tablet Take 5 mg by mouth daily.   [DISCONTINUED] acetaminophen  (TYLENOL ) 325 MG suppository Place 325 mg rectally every 8 (eight) hours as needed for mild pain.   [DISCONTINUED] FLOVENT  HFA 110 MCG/ACT inhaler Inhale 2 puffs into the lungs 2 (two) times daily.   [DISCONTINUED] VENTOLIN  HFA 108 (90 Base) MCG/ACT inhaler Inhale 2 puffs into the lungs every 4 (four) hours as needed for wheezing or shortness of breath.     Allergies:   Etanercept, Latex, Molds & smuts, Tape, Tramadol, Wound dressing adhesive, Wound dressings, Other, Ziprasidone  hcl, Tramadol hcl, and Ziprasidone    Social History   Socioeconomic History   Marital status: Significant Other    Spouse name: Not on file   Number of children: Not on file   Years of education: Not on file   Highest education level: Not on file   Occupational History   Occupation: STUDENT    Employer: [employer]    Comment: 11th grade [employer]  Tobacco Use   Smoking status: Never   Smokeless tobacco: Never  Substance and Sexual Activity   Alcohol use: Yes    Comment: socially   Drug use: No   Sexual activity: Not Currently  Other Topics Concern   Not on file  Social History Narrative   Not on file   Social Drivers of Health   Financial Resource Strain: Medium Risk (07/15/2023)   Received from [facility]   Overall Financial Resource Strain (CARDIA)    Difficulty of Paying Living Expenses: Somewhat hard  Food Insecurity: Food Insecurity Present (07/15/2023)   Received from [facility]   Hunger Vital Sign    Worried About Running Out of Food in the Last Year: Sometimes true    Ran Out of Food in the Last Year: Sometimes true  Transportation  Needs: No Transportation Needs (07/15/2023)   Received from [facility] - Transportation    Lack of Transportation (Medical): No    Lack of Transportation (Non-Medical): No  Physical Activity: Insufficiently Active (04/08/2022)   Received from [facility]   Exercise Vital Sign    Days of Exercise per Week: 3 days    Minutes of Exercise per Session: 20 min  Stress: Stress Concern Present (04/08/2022)   Received from [facility] - Occupational Stress Questionnaire    Feeling of Stress : To some extent  Social Connections: Socially Integrated (04/08/2022)   Received from [facility]   Social Network    How would you rate your social network (family, work, friends)?: Good participation with social networks     Family History: The patient's family history includes Allergic rhinitis in her mother; Bipolar disorder in her father; Depression in her maternal grandmother and mother; Diabetes in her maternal grandmother; Drug abuse in her father; Hypertension in her father and  maternal grandmother. ROS:   Please see the history of present illness.    All 14 point review of systems negative except as described per history of present illness  EKGs/Labs/Other Studies Reviewed:         Recent Labs: No results found for requested labs within last 365 days.  Recent Lipid Panel    Component Value Date/Time   CHOL 155 04/17/2017 0644   TRIG 135 04/17/2017 0644   HDL 46 04/17/2017 0644   CHOLHDL 3.4 04/17/2017 0644   VLDL 27 04/17/2017 0644   LDLCALC 82 04/17/2017 0644    Physical Exam:    VS:  BP (!) 142/96 (BP Location: Right Arm, Patient Position: Sitting)   Pulse 68   Ht 5' 3 (1.6 m)   Wt (!) 379 lb (171.9 kg)   SpO2 96%   BMI 67.14 kg/m     Wt Readings from Last 3 Encounters:  07/13/23 (!) 379 lb (171.9 kg)  04/14/22 (!) 388 lb (176 kg)  01/20/22 (!) 369 lb 6.4 oz (167.6 kg)     GEN:  Well nourished, well developed in no acute distress HEENT: Normal NECK: No JVD; No carotid bruits LYMPHATICS: No lymphadenopathy CARDIAC: RRR, no murmurs, no rubs, no gallops RESPIRATORY:  Clear to auscultation without rales, wheezing or rhonchi  ABDOMEN: Soft, non-tender, non-distended MUSCULOSKELETAL:  No edema; No deformity  SKIN: Warm and dry LOWER EXTREMITIES: no swelling NEUROLOGIC:  Alert and oriented x 3 PSYCHIATRIC:  Normal affect   ASSESSMENT:    1. Primary hypertension   2. OSA on CPAP   3. Fatty liver    PLAN:    In order of problems listed above:  Essential hypertension blood pressure reasonably controlled continue present management. Obstructive sleep apnea she is CPAP mask and regular basis, fatty liver noted. Morbid obesity obviously huge problem she understand that she is trying to work on that. Majority of discussion today involve about her family of her and the psychological issues.  Likely she does have psychologist that she see on the regular basis and she is planning to get visit very soon   Medication Adjustments/Labs and  Tests Ordered: Current medicines are reviewed at length with the patient today.  Concerns regarding medicines are outlined above.  Orders Placed This Encounter  Procedures   EKG 12-Lead   Medication changes:  Meds ordered this encounter  Medications   furosemide  (LASIX )  20 MG tablet    Sig: Take 1 tablet (20 mg total) by mouth daily.    Dispense:  90 tablet    Refill:  3    Signed, Manfred Seed, MD, [facility] 07/20/2023 9:07 AM    [facility]

## 2023-07-22 ENCOUNTER — Encounter: Admit: 2023-07-22 | Discharge: 2023-07-22 | Payer: Medicaid (Managed Care) | Attending: Family | Primary: Family

## 2023-07-22 DIAGNOSIS — Z6841 Body Mass Index (BMI) 40.0 and over, adult: Principal | ICD-10-CM

## 2023-07-22 DIAGNOSIS — E66813 Class 3 severe obesity due to excess calories with body mass index (BMI) of 60.0 to 69.9 in adult, unspecified whether serious comorbidity present (CMS-HCC): Principal | ICD-10-CM

## 2023-07-22 DIAGNOSIS — E039 Hypothyroidism, unspecified: Principal | ICD-10-CM

## 2023-07-22 DIAGNOSIS — Z7689 Persons encountering health services in other specified circumstances: Principal | ICD-10-CM

## 2023-07-22 DIAGNOSIS — R5383 Other fatigue: Principal | ICD-10-CM

## 2023-07-22 DIAGNOSIS — I10 Essential (primary) hypertension: Principal | ICD-10-CM

## 2023-07-22 DIAGNOSIS — G932 Benign intracranial hypertension: Principal | ICD-10-CM

## 2023-07-22 DIAGNOSIS — R42 Dizziness and giddiness: Principal | ICD-10-CM

## 2023-07-22 DIAGNOSIS — D509 Iron deficiency anemia, unspecified: Principal | ICD-10-CM

## 2023-07-22 LAB — CBC W/ AUTO DIFF
BASOPHILS ABSOLUTE COUNT: 0.1 10*9/L (ref 0.0–0.1)
BASOPHILS RELATIVE PERCENT: 0.7 %
EOSINOPHILS ABSOLUTE COUNT: 0.1 10*9/L (ref 0.0–0.5)
EOSINOPHILS RELATIVE PERCENT: 0.9 %
HEMATOCRIT: 38.5 % (ref 34.0–44.0)
HEMOGLOBIN: 12.7 g/dL (ref 11.3–14.9)
LYMPHOCYTES ABSOLUTE COUNT: 2.8 10*9/L (ref 1.1–3.6)
LYMPHOCYTES RELATIVE PERCENT: 25.1 %
MEAN CORPUSCULAR HEMOGLOBIN CONC: 33.1 g/dL (ref 32.0–36.0)
MEAN CORPUSCULAR HEMOGLOBIN: 26.3 pg (ref 25.9–32.4)
MEAN CORPUSCULAR VOLUME: 79.5 fL (ref 77.6–95.7)
MEAN PLATELET VOLUME: 7.5 fL (ref 6.8–10.7)
MONOCYTES ABSOLUTE COUNT: 0.8 10*9/L (ref 0.3–0.8)
MONOCYTES RELATIVE PERCENT: 7.5 %
NEUTROPHILS ABSOLUTE COUNT: 7.3 10*9/L (ref 1.8–7.8)
NEUTROPHILS RELATIVE PERCENT: 65.8 %
NUCLEATED RED BLOOD CELLS: 0 /100{WBCs} (ref <=4)
PLATELET COUNT: 234 10*9/L (ref 150–450)
RED BLOOD CELL COUNT: 4.84 10*12/L (ref 3.95–5.13)
RED CELL DISTRIBUTION WIDTH: 14.7 % (ref 12.2–15.2)
WBC ADJUSTED: 11.1 10*9/L (ref 3.6–11.2)

## 2023-07-22 LAB — TSH: THYROID STIMULATING HORMONE: 0.696 u[IU]/mL (ref 0.600–3.300)

## 2023-07-22 LAB — IRON & TIBC
IRON SATURATION: 12 %
IRON: 46 ug/dL — ABNORMAL LOW (ref 49–181)
TOTAL IRON BINDING CAPACITY: 375 ug/dL (ref 252–479)

## 2023-07-22 MED ORDER — LEVOTHYROXINE 75 MCG TABLET
ORAL_TABLET | Freq: Every day | ORAL | 2 refills | 90.00000 days | Status: CP
Start: 2023-07-22 — End: 2024-04-17

## 2023-07-22 NOTE — Unmapped (Signed)
 Assessment and Plan:   Tara Christensen presents to office to establish care. Medical history reviewed. Medications reconciled. Labwork ordered to establish baseline values.   Patient instructed to follow up as instructed. Patient may contact office if needed prior to next appointment.    Assessment & Plan  Fatigue, unspecified type  Persistent fatigue and dizziness suggest low iron levels. Previous labs showed low hemoglobin and iron.  - Order CBC to reassess hemoglobin levels.  - Order iron studies to evaluate current iron status.     Orders:    CBC w/ Differential; Future    Iron & TIBC; Future    Dizziness  Persistent fatigue and dizziness suggest low iron levels. Previous labs showed low hemoglobin and iron.  - Order CBC to reassess hemoglobin levels.  - Order iron studies to evaluate current iron status.     Orders:    CBC w/ Differential; Future    Iron & TIBC; Future    Acquired hypothyroidism  Requires Synthroid refill. Symptoms may relate to thyroid function. Reassessment necessary for management.  - Order TSH to assess current thyroid function.  - Refill Synthroid prescription.  Orders:    TSH; Future    levothyroxine (SYNTHROID) 75 MCG tablet; Take 1 tablet (75 mcg total) by mouth daily.    Iron deficiency anemia, unspecified iron deficiency anemia type  Persistent fatigue and dizziness suggest low iron levels. Previous labs showed low hemoglobin and iron.  - Order CBC to reassess hemoglobin levels.  - Order iron studies to evaluate current iron status.       Idiopathic intracranial hypertension  Has a shunt in place. Follow-up with specialists scheduled. Regular monitoring required due to vision issues.       Class 3 severe obesity due to excess calories with body mass index (BMI) of 60.0 to 69.9 in adult, unspecified whether serious comorbidity present (CMS-HCC)  On Wegovy with decreased hunger. Considering bariatric surgery postpartum. Continued monitoring necessary.  - Continue Wegovy as prescribed.  - Follow up with weight management specialist on July 31st.       Chronic hypertension  Blood pressure lower than usual. On labetalol, hydralazine, and Lasix. Cardiologist adjusted labetalol dosage.  - Monitor blood pressure and adjust medications as needed.  - Follow up with cardiologist as scheduled.       Encounter to establish care  Encounter to establish care. Medications, Medical hx, and care gaps reviewed. To follow up with physical exam when due.  Routine labs and regular monitoring essential for comprehensive care.  - Perform routine labs today.  - Schedule physical when due.           Assessment & Plan    Follow-up  Advised follow-up in six months unless issues arise. Encouraged use of patient portal.  - Schedule follow-up appointment in six months.  - Use patient portal for communication and questions.     Return in about 6 months (around 01/21/2024) for [facility] Condition F/U.     Subjective:     History of Present Illness  Tara Christensen is a 28 year old female who presents for a new primary care provider and medication refills.    She requires a refill of her thyroid medication, Synthroid, which she takes at a dose of 75 micrograms daily. She has a history of hypothyroidism since middle school and is unsure of the last time her TSH was checked, though it was likely done by her OB during her recent pregnancy.  She experiences persistent fatigue and dizziness, which she attributes to low iron levels. Her last complete blood count in April, following the birth of her child, showed a hemoglobin level of 11. Her iron and total iron binding capacity were low, but her ferritin was normal. She takes ferrous sulfate 325 mg daily as an iron supplement, along with a multivitamin.    She has a history of intracranial hypertension with a shunt placement and experiences vision issues related to this condition. She is scheduled to see her specialist in [place] on June 24th and neuro-ophthalmology on July 2nd.    She is currently taking Wegovy for weight management, having started it last week, and reports decreased appetite without gastrointestinal side effects. She is not yet six months postpartum, which limits her eligibility for certain treatments.    Her current medication regimen includes bupropion 450 mg, sertraline 250 mg, Singulair, magnesium oxide, labetalol, hydralazine, Lasix, Flexeril, Cimzia for psoriasis, pantoprazole for reflux, and VESIcare. She also takes a stool softener and is on birth control. Her blood pressure is typically around 140 mmHg systolic. Her labetalol dose was recently adjusted by her cardiologist.    She has three young children, aged two years, one year, and two months, and manages her health conditions while caring for them.       ROS:   Review of systems negative unless otherwise noted as per HPI.      Objective:          07/22/23 1411   BP: 133/85   Pulse: 64   Resp: 18   Temp: 37 ??C (98.6 ??F)   SpO2: 98%   Weight: (!) 170.6 kg (376 lb)   Height: 161.3 cm (5' 3.5)   PainSc: 0-No pain       Physical Exam:  Physical Exam  Constitutional:       Appearance: Normal appearance. She is obese.   HENT:      Head: Normocephalic.      Nose: Nose normal.      Mouth/Throat:      Mouth: Mucous membranes are moist.     Eyes:      Conjunctiva/sclera: Conjunctivae normal.       Cardiovascular:      Rate and Rhythm: Normal rate and regular rhythm.      Pulses: Normal pulses.   Pulmonary:      Effort: Pulmonary effort is normal.   Abdominal:      General: Bowel sounds are normal.      Palpations: Abdomen is soft.     Musculoskeletal:         General: Normal range of motion.      Cervical back: Normal range of motion.     Skin:     General: Skin is warm and dry.      Capillary Refill: Capillary refill takes less than 2 seconds.     Neurological:      General: No focal deficit present.      Mental Status: She is alert and oriented to person, place, and time.     Psychiatric:         Mood and Affect: Mood normal.         Behavior: Behavior normal.           Sharlet KATHEE Stanley, FNP

## 2023-07-22 NOTE — Unmapped (Signed)
 Blood pressure lower than usual. On labetalol, hydralazine, and Lasix. Cardiologist adjusted labetalol dosage.  - Monitor blood pressure and adjust medications as needed.  - Follow up with cardiologist as scheduled.

## 2023-07-22 NOTE — Unmapped (Signed)
 Persistent fatigue and dizziness suggest low iron levels. Previous labs showed low hemoglobin and iron.  - Order CBC to reassess hemoglobin levels.  - Order iron studies to evaluate current iron status.

## 2023-07-22 NOTE — Unmapped (Signed)
 Has a shunt in place. Follow-up with specialists scheduled. Regular monitoring required due to vision issues.

## 2023-07-28 NOTE — Unmapped (Signed)
 RN attempted to reach patient, left VM letting her know per Dr. Kassie that he would like to see results from her ophthalmology appointment before she follows up again in clinic. If completed, please let us  know where evaluation was done so that we can work on obtaining those records. If not, will likely want to reschedule until after ophthalmology has been seen.

## 2023-07-29 NOTE — Unmapped (Signed)
 RN spoke with pt about ophthalmology appointment and pt said it was ok to reschedule till after the opthal appointment. Rescheduled for 7/25. Pt see [facility] ophthalmology. She mentioned that Dr. Kassie placed her stents, but she would also like to discuss her shunt since her primary neurosurgeon at [facility] left the practice.

## 2023-07-29 NOTE — Unmapped (Signed)
 Pt LVM for [facility] stating that she was seen last year by ophthalmology, but has another appointment on 08/10/23.

## 2023-08-02 NOTE — Unmapped (Signed)
 [facility] sent a Mychart message to the patient explaining the CHW program and services offered. Pt was informed that they have the option to self-schedule using the included ticket scheduling link. The current referral has been canceled; however, the scheduling link remains active and available to the patient for the next 30 days. If additional assistance is needed beyond that time, a new referral will need to be placed.

## 2023-08-15 DIAGNOSIS — E66813 Class 3 obesity: Principal | ICD-10-CM

## 2023-08-15 MED ORDER — WEGOVY 0.25 MG/0.5 ML SUBCUTANEOUS PEN INJECTOR
SUBCUTANEOUS | 0 refills | 0.00000 days
Start: 2023-08-15 — End: ?

## 2023-08-15 NOTE — Unmapped (Signed)
 Adult Refill: Patient is requesting the following refill  Requested Prescriptions     Pending Prescriptions Disp Refills    WEGOVY  0.25 MG/0.5 ML SUBCUTANEOUS PEN INJECTOR 2 mL 0     Sig: Inject 0.25 mg under the skin every seven (7) days.       Recent Visits  Date Type Provider Dept   07/08/23 Office Visit Rosser, Mylinda Silvan, [place] [facility]   Showing recent visits within past 365 days and meeting all other requirements  Future Appointments  No visits were found meeting these conditions.  Showing future appointments within next 365 days and meeting all other requirements       Labs: A1c:   Hemoglobin A1C (%)   Date Value   07/08/2023 4.9

## 2023-08-16 DIAGNOSIS — Z0389 Encounter for observation for other suspected diseases and conditions ruled out: Principal | ICD-10-CM

## 2023-08-16 DIAGNOSIS — E66813 Class 3 severe obesity in adult: Principal | ICD-10-CM

## 2023-08-16 MED ORDER — WEGOVY 0.5 MG/0.5 ML SUBCUTANEOUS PEN INJECTOR
SUBCUTANEOUS | 0 refills | 0.00000 days | Status: CP
Start: 2023-08-16 — End: ?

## 2023-08-16 MED ORDER — WEGOVY 0.25 MG/0.5 ML SUBCUTANEOUS PEN INJECTOR
SUBCUTANEOUS | 0 refills | 0.00000 days
Start: 2023-08-16 — End: ?

## 2023-08-17 MED FILL — CIMZIA 400 MG/2 ML (200 MG/ML X 2) SUBCUTANEOUS SYRINGE KIT: SUBCUTANEOUS | 28 days supply | Qty: 2 | Fill #1

## 2023-08-17 NOTE — Unmapped (Signed)
 [facility] Specialty and Home Delivery Pharmacy Refill Coordination Note    Tara Christensen, DOB: [birth year]  Phone: There are no phone numbers on file.      All above HIPAA information was verified with patient.         08/16/2023     5:11 PM   Specialty Rx Medication Refill Questionnaire   Which Medications would you like refilled and shipped? Cizima   Please list all current allergies: Ultram and adhesive de tape   Have you missed any doses in the last 30 days? No   Have you had any changes to your medication(s) since your last refill? No   How much of each medication do you have remaining at home? (eg. number of tablets, injections, etc.) 0   If receiving an injectable medication, next injection date is 08/22/2023   Have you experienced any side effects in the last 30 days? No   Please enter the full address (street address, city, state, zip code) where you would like your medication(s) to be delivered to. [address] [place] [phone]   Please specify on which day you would like your medication(s) to arrive. Note: if you need your medication(s) within 3 days, please call the pharmacy to schedule your order at [phone]  08/19/2023   Has your insurance changed since your last refill? No   Would you like a pharmacist to call you to discuss your medication(s)? No   Do you require a signature for your package? (Note: if we are billing Medicare Part B or your order contains a controlled substance, we will require a signature) No   I have been provided my out of pocket cost for my medication and approve the pharmacy to charge the amount to my credit card on file. Yes         Completed refill call assessment today to schedule patient's medication shipment from the [facility] and [facility] Delivery Pharm[facility] [phone]).  All relevant notes have been reviewed.       Confirmed patient received a Conservation officer, historic buildings and a Surveyor, mining with first shipment. The patient will receive a drug information handout for each medication shipped and additional FDA Medication Guides as required.         REFERRAL TO PHARMACIST     Referral to the pharmacist: Not needed      [facility]     Shipping address confirmed in Epic.     Delivery Scheduled: Yes, Expected medication delivery date: 08/19/2023.     Medication will be delivered via UPS to the prescription address in Epic WAM.    Lamarr HERO Dross   [facility] Specialty and Home Delivery Pharmacy Specialty Technician

## 2023-08-23 ENCOUNTER — Other Ambulatory Visit: Payer: Self-pay | Admitting: Cardiology

## 2023-08-23 ENCOUNTER — Ambulatory Visit: Payer: MEDICAID | Admitting: Cardiology

## 2023-08-23 MED ORDER — CYCLOBENZAPRINE 10 MG TABLET
ORAL_TABLET | Freq: Every evening | ORAL | 3 refills | 30.00000 days
Start: 2023-08-23 — End: ?

## 2023-09-14 DIAGNOSIS — L409 Psoriasis, unspecified: Principal | ICD-10-CM

## 2023-09-14 MED ORDER — CIMZIA 400 MG/2 ML (200 MG/ML X 2) SUBCUTANEOUS SYRINGE KIT
SUBCUTANEOUS | 1 refills | 28.00000 days
Start: 2023-09-14 — End: ?

## 2023-09-15 MED ORDER — CIMZIA 400 MG/2 ML (200 MG/ML X 2) SUBCUTANEOUS SYRINGE KIT
SUBCUTANEOUS | 1 refills | 28.00000 days | Status: CP
Start: 2023-09-15 — End: ?
  Filled 2023-10-03: qty 2, 28d supply, fill #0

## 2023-09-20 DIAGNOSIS — E66813 Class 3 severe obesity in adult: Principal | ICD-10-CM

## 2023-09-20 MED ORDER — WEGOVY 0.5 MG/0.5 ML SUBCUTANEOUS PEN INJECTOR
SUBCUTANEOUS | 0 refills | 0.00000 days | Status: CP
Start: 2023-09-20 — End: ?

## 2023-09-20 NOTE — Unmapped (Signed)
 Adult Refill: Patient is requesting the following refill  Requested Prescriptions     Pending Prescriptions Disp Refills    WEGOVY  0.5 MG/0.5 ML SUBCUTANEOUS PEN INJECTOR 2 mL 0     Sig: Inject 0.5 mg under the skin every seven (7) days.       Recent Visits  Date Type Provider Dept   07/08/23 Office Visit Rosser, Mylinda Silvan, [place] [facility]   Showing recent visits within past 365 days and meeting all other requirements  Future Appointments  No visits were found meeting these conditions.  Showing future appointments within next 365 days and meeting all other requirements       Labs: A1c:   Hemoglobin A1C (%)   Date Value   07/08/2023 4.9

## 2023-09-26 MED ORDER — CYCLOBENZAPRINE 10 MG TABLET
ORAL_TABLET | Freq: Every evening | ORAL | 3 refills | 30.00000 days
Start: 2023-09-26 — End: ?

## 2023-09-28 NOTE — Unmapped (Signed)
 [facility] Specialty and Home Delivery Pharmacy Refill Coordination Note    Specialty Medication(s) to be Shipped:   Inflammatory Disorders: Cimzia     Other medication(s) to be shipped: No additional medications requested for fill at this time    Specialty Medications not needed at this time: N/A     Tara Christensen, DOB: 21-Apr-1995  Phone: There are no phone numbers on file.      All above HIPAA information was verified with patient.     Was a Nurse, learning disability used for this call? No    Completed refill call assessment today to schedule patient's medication shipment from the [facility] and Home Delivery Pharmacy  [phone]).  All relevant notes have been reviewed.     Specialty medication(s) and dose(s) confirmed: Regimen is correct and unchanged.   Changes to medications: Yasmene reports no changes at this time.  Changes to insurance: No  New side effects reported not previously addressed with a pharmacist or physician: None reported  Questions for the pharmacist: No    Confirmed patient received a Conservation officer, historic buildings and a Surveyor, mining with first shipment. The patient will receive a drug information handout for each medication shipped and additional FDA Medication Guides as required.       DISEASE/MEDICATION-SPECIFIC INFORMATION        For patients on injectable medications: Next injection is scheduled for 8/25.    SPECIALTY MEDICATION ADHERENCE     Medication Adherence    Patient reported X missed doses in the last month: 0  Specialty Medication: CIMZIA  400 mg/2 mL (200 mg/mL x 2) SyKt injection syringe  Patient is on additional specialty medications: No  Informant: patient              Were doses missed due to medication being on hold? No    CIMZIA  400 mg/2 mL (200 mg/mL x 2) SyKt injection syringe: 0 doses of medicine on hand       REFERRAL TO PHARMACIST     Referral to the pharmacist: Not needed      SHIPPING     Shipping address confirmed in Epic.     Cost and Payment: Unable to determine copay at this time as the prescription requires a prior authorization/financial assistance. Patient is aware that shipment will be held until copay has been approved and payment information collected, if needed.    Delivery Scheduled: Yes, Expected medication delivery date: 8/26.     Medication will be delivered via UPS to the temporary address in Epic WAM.    Jeoffrey JAYSON Sherra UNK Specialty and [facility]

## 2023-09-29 DIAGNOSIS — L409 Psoriasis, unspecified: Principal | ICD-10-CM

## 2023-09-30 ENCOUNTER — Ambulatory Visit: Admit: 2023-09-30 | Discharge: 2023-10-01 | Payer: Medicaid (Managed Care)

## 2023-09-30 DIAGNOSIS — E66813 Class 3 severe obesity due to excess calories with body mass index (BMI) of 60.0 to 69.9 in adult, unspecified whether serious comorbidity present (CMS-HCC): Principal | ICD-10-CM

## 2023-09-30 DIAGNOSIS — E039 Hypothyroidism, unspecified: Principal | ICD-10-CM

## 2023-09-30 DIAGNOSIS — Z3A01 Less than 8 weeks gestation of pregnancy: Principal | ICD-10-CM

## 2023-09-30 DIAGNOSIS — Z3200 Encounter for pregnancy test, result unknown: Principal | ICD-10-CM

## 2023-09-30 DIAGNOSIS — Z6841 Body Mass Index (BMI) 40.0 and over, adult: Principal | ICD-10-CM

## 2023-09-30 DIAGNOSIS — I10 Essential (primary) hypertension: Principal | ICD-10-CM

## 2023-09-30 DIAGNOSIS — D509 Iron deficiency anemia, unspecified: Principal | ICD-10-CM

## 2023-09-30 LAB — HCG QUANTITATIVE, BLOOD: BETA HCG QUANTITATIVE: 301.6 IU/L — ABNORMAL HIGH (ref <5.0)

## 2023-09-30 LAB — IRON & TIBC
IRON SATURATION: 29 %
IRON: 94 ug/dL (ref 49–181)
TOTAL IRON BINDING CAPACITY: 326 ug/dL (ref 252–479)

## 2023-09-30 MED ORDER — PRENATAL VITAMINS PLUS LOW IRON 27 MG IRON-1 MG TABLET
ORAL_TABLET | Freq: Every day | ORAL | 4 refills | 30.00000 days | Status: CP
Start: 2023-09-30 — End: ?

## 2023-09-30 NOTE — Unmapped (Signed)
 Assessment and Plan:   Tara Christensen presents to clinic today for an acute visit.   Assessment & Plan  Visit for confirmation of pregnancy test result with physical exam  Confirmed pregnancy with positive urine test. Estimated due date June 02, 2024. History of early deliveries and preeclampsia.  - Order quantitative hCG to verify pregnancy progression.  - Continue high-risk pregnancy care at [facility] in [place].  - Follow up with OB for ongoing pregnancy management.  Orders:    Pregnancy Qualitative, Urine    POCT Pregnancy Urine, Qualitative    multivitamin, prenatal, folic acid-iron , (PRENATAL VITAMIN PLUS LOW IRON ) 27-1 mg Tab; Take 1 tablet by mouth daily.    hCG, Quantitative, Blood; Future    Less than [redacted] weeks gestation of pregnancy (HHS-HCC)  Confirmed pregnancy with positive urine test. Estimated due date June 02, 2024. History of early deliveries and preeclampsia.  - Order quantitative hCG to verify pregnancy progression.  - Continue high-risk pregnancy care at [facility] in [place].  - Follow up with OB for ongoing pregnancy management.      Orders:    multivitamin, prenatal, folic acid-iron , (PRENATAL VITAMIN PLUS LOW IRON ) 27-1 mg Tab; Take 1 tablet by mouth daily.    Iron  deficiency anemia, unspecified iron  deficiency anemia type    Orders:    Iron  & TIBC; Future    Acquired hypothyroidism  Iron  deficiency anemia with improved iron  level from 24 to 46. Total iron  binding capacity 375. Last CBC showed hemoglobin 12.7 and hematocrit 38.5.       Chronic hypertension  Chronic hypertension managed with labetalol . Blood pressure 120/82 mmHg.  - Continue labetalol  600 mg twice daily.  - Discontinue hydralazine  until further evaluation by OB.       Class 3 severe obesity due to excess calories with body mass index (BMI) of 60.0 to 69.9 in adult, unspecified whether serious comorbidity present (CMS-HCC)  BMI 66.25. Wegovy  discontinued due to pregnancy         Assessment & Plan      Return if symptoms worsen or fail to improve.     Subjective:     History of Present Illness  Tara Christensen is a 28 year old female with idiopathic intracranial hypertension and preeclampsia who presents for confirmation of pregnancy.    She had positive pregnancy tests on August 16th, 17th, and 18th. Her last menstrual period was on July 16th. No implantation bleeding this time, which she typically experienced in previous pregnancies, but she has tender breasts and amenorrhea.    She has a history of idiopathic intracranial hypertension and preeclampsia with her last two pregnancies, all of which were delivered early at either 30 or [redacted] weeks gestation. She is currently on labetalol  600 mg twice daily for blood pressure management. She has a history of hypothyroidism and is on levothyroxine  75 mcg daily. Her last TSH was normal in June.    She has a history of anemia. Her last iron  level was 46, which had improved from a previous low of 24. Total iron  binding capacity was 375, and her last CBC in June showed a hemoglobin of 12.7 and hematocrit of 38.5.    She was previously on Wegovy  but has stopped taking it. Her BMI is 66.25.      ROS:   Review of systems negative unless otherwise noted as per HPI.      Objective:          09/30/23 9061  BP: 120/82   Pulse: 79   Resp: 18   Temp: 36.7 ??C (98.1 ??F)   SpO2: 98%   Weight: (!) 172.4 kg (380 lb)   Height: 161.3 cm (5' 3.5)   PainSc: 0-No pain       Physical Exam:  Physical Exam      Sharlet KATHEE Stanley, FNP

## 2023-09-30 NOTE — Unmapped (Addendum)
 Orders:    Iron  & TIBC; Future

## 2023-09-30 NOTE — Unmapped (Signed)
 Chronic hypertension managed with labetalol . Blood pressure 120/82 mmHg.  - Continue labetalol  600 mg twice daily.  - Discontinue hydralazine  until further evaluation by OB.

## 2023-09-30 NOTE — Unmapped (Addendum)
 Follow up with OB in [facility] for pregnancy care.     Discontinue hydralazine  and wegovy 

## 2023-10-04 NOTE — Unmapped (Signed)
 Patient reports EXACT LMP of 08/24/2023 . Additional notes related to LMP/EDD assignment (if applicable):    Patient's BMI 64.51    Patient RISKS OUT OF GOG CARE DUE TO BMI>=50  Patient assigned to/chooses MFM    New to nurse interview completed with patient. History obtained and abstracted to Epic.   Confirmed/completed patient enrollment in MyChart. Advised patient to utilize MyChart for communication.   Standard OBNTNPATIENTINFO message sent to patient  Discussed contact information for scheduling, nurse advice line, and after-hours paging service  Discussed patient facing website (QR code/link in patient message), My 9 Months booklet and other important resources available on the site, including medications safe to take during pregnancy  Patient verbalized understanding of all information given and did not have any questions.     Patient's last pap smear was NORMAL in year 2022, results PER PATIENT  Patient CONSENTS to blood products in an emergency.   Social Determinants of Health Screening: Positive - declined offer for social work referral

## 2023-10-17 MED ORDER — CYCLOBENZAPRINE 10 MG TABLET
ORAL_TABLET | Freq: Every evening | ORAL | 3 refills | 30.00000 days | Status: CP
Start: 2023-10-17 — End: ?

## 2023-10-27 NOTE — Unmapped (Signed)
 10/27/2023 - Patient originally requested delivery for 10/31/2023. Delivery is not possible on this date due to delivery restrictions. I have reached out to the patient and confirmed that delivery on 11/01/2023 is ok.  [facility] Specialty and Home Delivery Pharmacy Refill Coordination Note    Tara Christensen, DOB: Mar 16, 1995  Phone: There are no phone numbers on file.      All above HIPAA information was verified with patient.         10/26/2023     6:09 PM   Specialty Rx Medication Refill Questionnaire   Which Medications would you like refilled and shipped? Cizima   Please list all current allergies: Adhesive tape, Ultron   Have you missed any doses in the last 30 days? No   Have you had any changes to your medication(s) since your last refill? No   How much of each medication do you have remaining at home? (eg. number of tablets, injections, etc.) 0   If receiving an injectable medication, next injection date is 11/09/2023   Have you experienced any side effects in the last 30 days? No   Please enter the full address (street address, city, state, zip code) where you would like your medication(s) to be delivered to. [address] [place] [phone]   Please specify on which day you would like your medication(s) to arrive. Note: if you need your medication(s) within 3 days, please call the pharmacy to schedule your order at [phone]  10/31/2023   Has your insurance changed since your last refill? No   Would you like a pharmacist to call you to discuss your medication(s)? No   Do you require a signature for your package? (Note: if we are billing Medicare Part B or your order contains a controlled substance, we will require a signature) No   I have been provided my out of pocket cost for my medication and approve the pharmacy to charge the amount to my credit card on file. Yes         Completed refill call assessment today to schedule patient's medication shipment from the [facility] and Home Delivery Pharmacy [phone]).  All relevant notes have been reviewed.       Confirmed patient received a Conservation officer, historic buildings and a Surveyor, mining with first shipment. The patient will receive a drug information handout for each medication shipped and additional FDA Medication Guides as required.         REFERRAL TO PHARMACIST     Referral to the pharmacist: Not needed      [facility]     Shipping address confirmed in Epic.     Delivery Scheduled: Yes, Expected medication delivery date: 11/01/2023.     Medication will be delivered via UPS to the temporary address in Epic WAM.    Tara Christensen   [facility] Specialty and Home Delivery Pharmacy Specialty Technician

## 2023-10-30 ENCOUNTER — Emergency Department
Admit: 2023-10-30 | Discharge: 2023-10-31 | Disposition: A | Discharge status: Home / Self Care | Payer: Medicaid (Managed Care) | Attending: Family Medicine

## 2023-10-30 NOTE — Unmapped (Signed)
 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 She reports feeling mentally foggy for two days and has no current symptoms of urinary tract infection. Blood pressure at home is typically in the 120s systolic. Her children recently had viral illnesses, but she denies current illness herself.    Differential diagnosis includes, but is not limited to:  - Mild dehydration related to early pregnancy: Dehydration is likely due to increased fluid loss and diuretic use in the setting of early pregnancy, presenting with dizziness and mental fogginess.  - Urinary tract infection: Considered as a common cause of symptoms in early pregnancy, but no urinary symptoms were reported.  Urinalysis negative for infection.    Early pregnancy with mild dehydration  - Administered IV fluids  - Patient felt improved after IV fluids  - Encouraged increased oral fluid intake  - Discharged home with instructions to keep scheduled OBGYN follow-up              Procedures                       _______________________________________________________________    Time seen: October 30, 2023 11:49 PM    I have reviewed the triage vital signs and the nursing notes.    CHIEF COMPLAINT    Chief Complaint   Patient presents with    Light Headed          History of Present Illness  Tara Christensen is a 28 year old female G4, P3 with a past medical history of hypertension, BMI 67 and intracranial hypertension who presents with symptoms of dehydration during early pregnancy.    She is currently pregnant and unsure of the gestational age. She feels dehydrated and is currently taking Diamox  and furosemide , which were prescribed for her hypertension and intracranial hypertension.     Her blood pressure at home typically runs in the 120s systolic and remains stable during pregnancy until the third trimester when it tends to increase. She checks her blood pressure daily and has not noticed any significant drops recently.    For the past two days, she feels 'in a fog' and experiences dizziness. She also experiences mild shortness of breath when walking. She is unsure if these symptoms are related to her pregnancy or a possible illness, as her son recently had rhinovirus, bronchitis, and RSV.    She has a history of depression and is currently on medication for this condition. She has three children aged five, three, and two, and this is her fourth pregnancy. She has not yet seen a healthcare provider for this pregnancy but has an appointment scheduled at [facility].       PAST MEDICAL HISTORY    Past Medical History[1]    SURGICAL HISTORY    Past Surgical History[2]    CURRENT MEDICATIONS    No current facility-administered medications for this encounter.    Current Outpatient Medications:     acetaminophen  (TYLENOL ) 325 MG tablet, Take 2 tablets (650 mg total) by mouth every six (6) hours. (Patient not taking: Reported on 09/30/2023), Disp: 90 tablet, Rfl:  0    acetazolamide  (DIAMOX ) 500 mg ER 12 hr capsule, Take 3 capsules (1,500 mg total) by mouth Three (3) times a day. (Patient not taking: Reported on 09/30/2023), Disp: 180 capsule, Rfl: 6    aspirin  (ECOTRIN) 81 MG tablet, Take 1 tablet (81 mg total) by mouth daily., Disp: , Rfl:     blood pressure monitor Kit, 46cm arm circumference  [address]  apt [address]  [place] [address]    Medicaid #-[id number] (Patient not taking: Reported on 09/30/2023), Disp: 1 kit, Rfl: 0    buPROPion  (WELLBUTRIN  XL) 150 MG 24 hr tablet, Take 3 tablets (450 mg total) by mouth every morning., Disp: 90 tablet, Rfl: 4    buPROPion  (WELLBUTRIN  XL) 300 MG 24 hr tablet, Take 1 tablet (300 mg total) by mouth daily., Disp: , Rfl:     CIMZIA  400 mg/2 mL (200 mg/mL x 2) SyKt injection syringe, Inject the contents of 2 syringes (400 mg total) under the skin every fourteen (14) days., Disp: 2 each, Rfl: 1    cyclobenzaprine  (FLEXERIL ) 10 MG tablet, Take 1 tablet (10 mg total) by mouth nightly., Disp: 30 tablet, Rfl: 3    DERMA-SMOOTHE /FS SCALP OIL 0.01 % external oil, APPLY TO SCALP THREE nights PER WEEK AS NEEDED, Disp: , Rfl:     docusate sodium  (COLACE) 100 MG capsule, Take 1 capsule (100 mg total) by mouth two (2) times a day., Disp: 60 capsule, Rfl: 11    empty container Misc, Use as directed to dispose of Cimzia  syringes., Disp: 1 each, Rfl: 2    furosemide  (LASIX ) 20 MG tablet, Take 1 tablet (20 mg total) by mouth daily for 2 days., Disp: 2 tablet, Rfl: 0    labetalol  (NORMODYNE ) 200 MG tablet, Take 3 tablets (600 mg total) by mouth two (2) times a day., Disp: 180 tablet, Rfl: 11    levothyroxine  (SYNTHROID ) 75 MCG tablet, Take 1 tablet (75 mcg total) by mouth daily., Disp: 90 tablet, Rfl: 2    magnesium  oxide (MAG-OX) 400 mg (241.3 mg elemental magnesium ) tablet, Take 1 tablet by mouth daily., Disp: , Rfl:     montelukast  (SINGULAIR ) 10 mg tablet, Take 1 tablet (10 mg total) by mouth nightly., Disp: 30 tablet, Rfl: 0    multivitamin, prenatal, folic acid-iron , (PRENATAL VITAMIN PLUS LOW IRON ) 27-1 mg Tab, Take 1 tablet by mouth daily., Disp: 30 tablet, Rfl: 4    pantoprazole  (PROTONIX ) 40 MG tablet, Take 1 tablet (40 mg total) by mouth daily., Disp: , Rfl:     sertraline  (ZOLOFT ) 100 MG tablet, Take 2 and 1/2 tablets (250 mg total) by mouth daily., Disp: 75 tablet, Rfl: 3    simethicone (MYLICON) 80 MG chewable tablet, Chew 1 tablet (80 mg total) every six (6) hours as needed for flatulence., Disp: , Rfl:     solifenacin (VESICARE) 5 MG tablet, Take 1 tablet (5 mg total) by mouth every morning., Disp: , Rfl:     ALLERGIES    Allergies[3]    FAMILY HISTORY    Family History[4]    SOCIAL HISTORY  Social History[5]      PHYSICAL EXAM      VITAL SIGNS:    ED Triage Vitals [10/30/23 2011]   Enc Vitals Group      BP 126/72      Pulse 82      SpO2 Pulse       Resp 22      Temp 36.6 ??C (97.9 ??  F)      Temp Source Temporal      SpO2 97 %      Weight (!) 172.4 kg (380 lb)      Height 1.6 m (5' 3)      Head Circumference       Peak Flow       Pain Score       Pain Loc       Pain Education       Exclude from Growth Chart        Constitutional: Alert and oriented. Well appearing and in no distress.  Eyes: Conjunctivae are normal.   ENT       Head: Normocephalic and atraumatic.       Nose: No congestion.       Mouth/Throat: Mucous membranes are moist.       Neck: No stridor.  Hematological/Lymphatic/Immunilogical: No cervical lymphadenopathy.  Cardiovascular: Normal rate, regular rhythm.   Respiratory: Normal respiratory effort. Breath sounds are normal.  Gastrointestinal: Soft and nontender. There is no CVA tenderness.  Musculoskeletal: No tenderness to palpation in the lower extremities.  No edema.  Neurologic: Normal speech and language. No gross focal neurologic deficits are appreciated.  Skin: Skin is warm, dry and intact. No rash noted.  Psychiatric: Mood and affect are normal. Speech and behavior are normal.      RADIOLOGY    No results found.    LABS  Labs Reviewed   URINALYSIS WITH MICROSCOPY - Abnormal; Notable for the following components:       Result Value    Bacteria, UA Occasional (*)     Mucus, UA Occasional (*)     All other components within normal limits   BASIC METABOLIC PANEL - Abnormal; Notable for the following components:    Chloride 109 (*)     CO2 18.0 (*)     All other components within normal limits             ED COURSE  Pertinent labs & imaging results that were available during my care of the patient were reviewed by me (see chart for details).      ATTESTATIONS      Portions of this record have been created using Dragon dictation software. Dictation errors have been sought, but may not have been identified and corrected.         [1]   Past Medical History:  Diagnosis Date    Anemia     Anxiety     Asthma (HHS-HCC)     Bipolar 1 disorder    (CMS-HCC)     Delayed emergence from general anesthesia     Depression     Disease of thyroid gland     Eczema     GBS bacteriuria 08/21/2021    PCN in labor    GERD (gastroesophageal reflux disease)     Headache     Hemorrhoids     History of pre-eclampsia in prior pregnancy, currently pregnant (HHS-HCC)     History of transfusion     Hypertension     IBS (irritable bowel syndrome)     IBS (irritable bowel syndrome)     Major depressive disorder     Migraine     Nexplanon insertion 06/16/2021    Inserted 09/01/21     Obesity     PONV (postoperative nausea and vomiting)     Postpartum depression     Psoriasis     Psychiatric Hospitalizations  PTSD (post-traumatic stress disorder)     Restless leg syndrome     Rh negative state in antepartum period (HHS-HCC) 05/31/2020    08/13/2021 rhogam given at [redacted]w[redacted]d     Seizures    (CMS-HCC) 05/13/2023    Due to severe preeclampsia    Self-injurious behavior     Sleep apnea, obstructive     Superimposed preeclampsia with severe features 05/16/2013    [x]  LDASA - On ASA for stent   [x]  baseline pre-e labs, if elevated spot of DM dx consider 24 hour urine  [x]  baseline EKG/TTE - followed by Cards, most recent echo 09/2020 with LVEF 55%  [x]  Fetal anatomic survey at 19-[redacted] weeks gestation  [x]  Serial growth ultrasounds every 4 weeks beginning at [redacted] weeks gestation if well controlled. If poorly controlled, recommend growth US  every 3-4 weeks from ti    Supervision of high risk pregnancy in third trimester (HHS-HCC) 03/24/2021    Dating: L=6                                           First trimester:   [x]  Prenatal labs reviewed   [ ]  SMA/Hgb electrophoresis/CF    [x]  Early 1 hr GTT*   [x]  Baseline HELLP*  [x]  LDASA* - on ASA and plavix  for venous stent   Third trimester:   [x]  CBC/RPR/HIV  [x]  1hr GTT [x]  GBS+  [o] Anesthesia consult*  Fetal screening:    [x]  Anatomy US   [-] Fetal echo*  [o] Fetal growth US  every 4 weeks     Trauma     Urinary tract infection     more than 5 in lifetime    Varicella    [2]   Past Surgical History:  Procedure Laterality Date    AN ARTERIAL LINE  05/12/2023    BRAIN SURGERY  11/27/20    CESAREAN SECTION  08/11/20- 09/01/21-05/12/23    CHOLECYSTECTOMY      EYE SURGERY  2022    NASAL SEPTUM SURGERY      OTHER SURGICAL HISTORY      Gallbladder, vp shunt, tonsils and adenoids and deviated, septum  and wisdom teeth and 4 stents in the brain    PR CESAREAN DELIVERY ONLY N/A 08/11/2020    Procedure: CESAREAN DELIVERY ONLY;  Surgeon: Lauraine Almarie Pizza, MD;  Location: L&D C-SECTION OR SUITES [facility];  Service: Maternal-Fetal Medicine    PR CESAREAN DELIVERY ONLY N/A 09/01/2021    Procedure: CESAREAN DELIVERY ONLY;  Surgeon: Genevia GORMAN Lav, MD;  Location: L&D C-SECTION OR SUITES [facility];  Service: Obstetrics    PR CESAREAN DELIVERY ONLY N/A 05/12/2023    Procedure: CESAREAN DELIVERY ONLY;  Surgeon: Manda Almarie Raveling, MD;  Location: L&D C-SECTION OR SUITES [facility];  Service: Obstetrics    PR DECOMPRESS OPTIC NERVE Bilateral 08/04/2020    Procedure: OPTIC NERV DECOMP;  Surgeon: Lonni JINNY Maillard, MD;  Location: MAIN OR [facility];  Service: Ophthalmology    TONSILLECTOMY AND ADENOIDECTOMY      WISDOM TOOTH EXTRACTION     [3]   Allergies  Allergen Reactions    Adhesive Rash     Other reaction(s): Rash   Other reaction(s): Rash   Other reaction(s): Rash    Other reaction(s): RashOther reaction(s): RashOther reaction(s): Rash    Adhesive Tape-Silicones Rash     Other reaction(s): Rash Other reaction(s): Rash Other reaction(s): Rash  Other reaction(s): RashOther reaction(s): RashOther reaction(s): Rash      Other reaction(s): Rash, Other reaction(s): Rash, Other reaction(s): Rash    Etanercept Nausea And Vomiting     Unsure re reaction    Latex Hives    Mold Other (See Comments) and Anaphylaxis allergy test     allergy test    Other Anaphylaxis, Other (See Comments) and Rash     Other reaction(s): Rash    Must wear at least sterling silver jewelry    Other reaction(s): Rash   Must wear at least sterling silver jewelry   Other reaction(s): Rash   Must wear at least sterling silver jewelry   Other reaction(s): Other (See Comments)   Other reaction(s): Rash   Must wear at least sterling silver jewelry    Other reaction(s): Other (See Comments)   Other reaction(s): Rash   Must wear at least sterling silver jewelry    Other reaction(s): Other (See Comments)   Other reaction(s): Rash   Must wear at least sterling silver jewelry   Other reaction(s): Rash   Must wear at least sterling silver jewelry   Other reaction(s): Rash   Must wear at least sterling silver jewelry   Other reaction(s): Other (See Comments)   Other reaction(s): Rash   Must wear at least sterling silver jewelry    Other reaction(s): Other (See Comments)Other reaction(s): RashMust wear at least sterling silver jewelryOther reaction(s): RashMust wear at least sterling silver jewelryOther reaction(s): RashMust wear at least sterling silver jewelryOther reaction(s): Other (See Comments)Other reaction(s): RashMust wear at least sterling silver jewelry    Other reaction(s): Rash Other reaction(s): Rash Other reaction(s): Rash  Other reaction(s): RashOther reaction(s): RashOther reaction(s): Rash  Other reaction(s): Rash, Other reaction(s): Rash, Other reaction(s): Rash    Tramadol Rash and Itching     Pt reports itching as well    Other reaction(s): Rash    Wound Dressings Rash     Other reaction(s): Rash Other reaction(s): Rash Other reaction(s): Rash  Other reaction(s): RashOther reaction(s): RashOther reaction(s): Rash  Other reaction(s): Rash, Other reaction(s): Rash, Other reaction(s): Rash    Ziprasidone Nausea And Vomiting     Unsure of reaction      Unsure of reaction  Unk reaction      Unsure of reaction  Unk reaction  Unsure of reaction    Unsure of reaction  Unk reaction      Unsure of reaction    Unknown reaction    Unsure of reaction  Unk reaction  Unsure of reaction      Unsure of reaction  Unsure of reaction  Unk reaction  Unsure of reaction  Unk reaction  Unsure of reaction      Unsure of reaction  Unk reaction    Ziprasidone Hcl      Unsure of reaction    Unk reaction    Ziprasidone Mesylate    [4]   Family History  Problem Relation Age of Onset    Multiple sclerosis Mother     Heart disease Mother     Depression Mother     Hypertension Mother     Retinal detachment Mother     Arthritis Mother     Mental illness Mother     Miscarriages / [place] Mother     Bipolar disorder Father     Diabetes Father     Hypertension Father     Alcohol abuse Father     Drug abuse Father     Depression  Father     Mental illness Father     Diabetes Maternal Grandmother     Heart disease Maternal Grandmother     Hypertension Maternal Grandmother     Depression Maternal Grandmother     Clotting disorder Maternal Grandmother         Pulmonary embolism    Mental illness Maternal Grandmother     Aneurysm Maternal Grandmother     Heart disease Maternal Grandfather         sudden death age 69    Diabetes Maternal Grandfather     Hypertension Maternal Grandfather     Hypertension Paternal Grandmother     Diabetes Paternal Grandmother     Diabetes Paternal Grandfather     Hypertension Paternal Grandfather     Cancer Other     Mental illness Other     Melanoma Neg Hx     Basal cell carcinoma Neg Hx     Squamous cell carcinoma Neg Hx     Glaucoma Neg Hx     Macular degeneration Neg Hx    [5]   Social History  Socioeconomic History    Marital status: Married     Spouse name: None    Number of children: 3    Years of education: None    Highest education level: None   Tobacco Use    Smoking status: Never    Smokeless tobacco: Never   Vaping Use    Vaping status: Never Used   Substance and Sexual Activity    Alcohol use: Not Currently    Drug use: Never    Sexual activity: Yes     Partners: Male     Birth control/protection: Condom, None   Other Topics Concern    Do you use sunscreen? Yes    Tanning bed use? No    Are you easily burned? Yes    Excessive sun exposure? No    Blistering sunburns? No     Social Drivers of Psychologist, prison and probation services Strain: Medium Risk (09/28/2023)    Overall Financial Resource Strain (CARDIA)     Difficulty of Paying Living Expenses: Somewhat hard   Food Insecurity: Food Insecurity Present (09/28/2023)    Hunger Vital Sign     Worried About Running Out of Food in the Last Year: Never true     Ran Out of Food in the Last Year: Sometimes true   Transportation Needs: Unmet Transportation Needs (09/28/2023)    PRAPARE - Therapist, art (Medical): Yes     Lack of Transportation (Non-Medical): Yes   Physical Activity: Insufficiently Active (04/08/2022)    Received from [facility]    Exercise Vital Sign     On average, how many days per week do you engage in moderate to strenuous exercise (like a brisk walk)?: 3 days     On average, how many minutes do you engage in exercise at this level?: 20 min   Stress: Stress Concern Present (04/08/2022)    Received from [facility] - Occupational Stress Questionnaire     Feeling of Stress : To some extent   Social Connections: Socially Integrated (04/08/2022)    Received from [facility]    Social Network     How would you rate your social network (family, work, friends)?: Good participation with social networks   Housing: Low Risk  (09/28/2023)    Housing  Within the past 12 months, have you ever stayed: outside, in a car, in a tent, in an overnight shelter, or temporarily in someone else's home (i.e. couch-surfing)?: No     Are you worried about losing your housing?: No        Cristopher Leita Caldron, MD  10/31/23 [phone]

## 2023-10-30 NOTE — Unmapped (Signed)
 [redacted]w[redacted]d pregnant, LMP 7/16 with positive home pregnancy tests.  Presents with feeling light headed and dizzy since yesterday

## 2023-10-31 LAB — URINALYSIS WITH MICROSCOPY
BILIRUBIN UA: NEGATIVE
BLOOD UA: NEGATIVE
GLUCOSE UA: NEGATIVE
KETONES UA: NEGATIVE
LEUKOCYTE ESTERASE UA: NEGATIVE
NITRITE UA: NEGATIVE
PH UA: 6.5 (ref 5.0–7.0)
PROTEIN UA: NEGATIVE
RBC UA: 0 /HPF (ref 0–3)
SPECIFIC GRAVITY UA: 1.025 (ref 1.005–1.030)
SQUAMOUS EPITHELIAL: 5 /HPF (ref 0–5)
UROBILINOGEN UA: 0.2
WBC UA: 0 /HPF (ref 0–3)

## 2023-10-31 LAB — BASIC METABOLIC PANEL
ANION GAP: 10 mmol/L (ref 5–14)
BLOOD UREA NITROGEN: 18 mg/dL (ref 7–21)
BUN / CREAT RATIO: 23
CALCIUM: 9.7 mg/dL (ref 8.5–10.2)
CHLORIDE: 109 mmol/L — ABNORMAL HIGH (ref 98–107)
CO2: 18 mmol/L — ABNORMAL LOW (ref 22.0–32.0)
CREATININE: 0.8 mg/dL (ref 0.60–1.00)
EGFR CKD-EPI (2021) FEMALE: >90 mL/min/1.73m2 (ref >=60)
GLUCOSE RANDOM: 90 mg/dL (ref 74–106)
POTASSIUM: 3.5 mmol/L (ref 3.5–5.0)
SODIUM: 137 mmol/L (ref 135–145)

## 2023-10-31 MED ADMIN — lactated ringers bolus 1,000 mL: 1000 mL | INTRAVENOUS | @ 05:00:00 | Stop: 2023-10-31

## 2023-10-31 MED FILL — CIMZIA 400 MG/2 ML (200 MG/ML X 2) SUBCUTANEOUS SYRINGE KIT: SUBCUTANEOUS | 28 days supply | Qty: 2 | Fill #1

## 2023-11-01 ENCOUNTER — Inpatient Hospital Stay: Admit: 2023-11-01 | Discharge: 2023-11-02 | Payer: Medicaid (Managed Care)

## 2023-11-01 DIAGNOSIS — Z3490 Encounter for supervision of normal pregnancy, unspecified, unspecified trimester: Principal | ICD-10-CM

## 2023-11-07 NOTE — Unmapped (Addendum)
 Currently on Labetalol  600mg  BID + hydralazine  50mg  q6 hours  Not currently taking BP at home  Follows with Dr. MARLA at [facility] and HELLP labs ordered today

## 2023-11-07 NOTE — Unmapped (Deleted)
 Provider: Enos MARLA Anger, MD  Division: Maternal Fetal Medicine    Outpatient OB Note: New OB Exam    ASSESSMENT AND PLAN     Problem List Items Addressed This Visit       Obstructive sleep apnea    Chronic hypertension - Primary    Currently on Labetalol  600mg  BID. Hydralazine  held iso pregnancy.         Relevant Orders    Hepatitis C Antibody    Chlamydia/Gonorrhoeae NAA    HIV Antigen/Antibody Combo    Rubella Antibody, IgG    Syphilis Screen    Hepatitis B Surface Antigen    CBC    Urine Culture    Varicella zoster antibody, IgG    Hemoglobin A1c    Type and Screen with Confirmation ABORh    Protein/Creatinine Ratio, Urine    Creatinine    ALT    AST    CBC    Supervision of high risk pregnancy in first trimester (HHS-HCC)    Relevant Orders    Natera Panorama Prenatal Test Full Panel       - NOB binder was provided. Reviewed plan for frequency of prenatal visits. Described [facility] practice model and L&D coverage.   - Discussed diet/lifestyle modifications to promote a healthy pregnancy. Based on their pregravid BMI of 65.56, {MFM IOM weight gain recommendations:21748}.   - Aneuploidy screening: {aneuploidy screening:92262}  - Carrier screening: {carrier screening:92263}  - Anatomy Ultrasound: {lcoultrasound options:116255}  - Risk factors for pre-eclampsia: {Preeclampsia risk factors:122442}  Start low dose aspirin  after 12 weeks until delivery if one high or two moderate risk factors: {LASA responses:122443}  Reference: Low-Dose Aspirin  Use for the Prevention of Preeclampsia and Related Morbidity and Mortality - ACOG    - Rubella: {Immunity:123202}  - Varicella: {Immunity:123202}  - Flu vaccine: {vaccine status:92264}  - COVID vaccine: {COVID vaccine status:92265}  - Tdap: offer at 27-36 weeks    - Infant feeding: {infant feeding plan:92310}  - Acceptable Blood Products: No data found  - Postpartum depression risk: {TXP HIGH/MOD/LOW:21024071}, EPDS Score: Edinburgh Postnatal Depression Scale Total: (not recorded) and answered The thought of harming myself has occurred to me.: (not recorded)  - Penicillin allergy status reviewed.  Patient {[facility] PCN allergy:123778}.    Intimate Partner Violence: Not At Risk (06/25/2022)    Received from [facility]    HITS     Over the last 12 months how often did your partner physically hurt you?: Never     Over the last 12 months how often did your partner insult you or talk down to you?: Never     Over the last 12 months how often did your partner threaten you with physical harm?: Never     Over the last 12 months how often did your partner scream or curse at you?: Never       No follow-ups on file.    Future Appointments   Date Time Provider Department Center   11/29/2023 11:00 AM Kassie Dallas Luke Alberteen, MD [facility]   01/20/2024 11:00 AM Lattimore, Sharlet Dubonnet, FNP [facility]       Enos MARLA Anger, MD    SUBJECTIVE     Tara Christensen 28 y.o. [phone] is a established [facility] patient who presents for new OB exam. Estimated Date of Delivery: 06/07/24    - Desired  - EGA: [redacted]w[redacted]d  - Pregnancy dating: {pregnancy dating:92261}  - Vaginal bleeding: no    Patient reports:  Temp: 36.9 ??C (98.4 ??F)  Movement: Present  Loss of Fluid: No  Bleeding: No  Contractions: Not present    GYN History:   Last Pap: {last pap:93942} 2024- normal Pap; 2022: NILM  Abnormal Pap hx: {abnormal Pap hx:93943}  STI history: {negative:210020314}     OB History   Gravida Para Term Preterm AB Living   4 3 0 3 0 3   SAB IAB Ectopic Molar Multiple Live Births   0 0 0  0 3      # Outcome Date GA Lbr Len/2nd Weight Sex Type Anes PTL Lv   4 Current            3 Preterm 05/12/23 [redacted]w[redacted]d  1880 g (4 lb 2.3 oz) M CS-Unspec Spinal, EPI Y LIV      Complications: Severe Preeclampsia   2 Preterm 09/01/21 [redacted]w[redacted]d  1570 g (3 lb 7.4 oz) F CS-Unspec Spinal, EPI Y LIV      Complications: Severe Preeclampsia   1 Preterm 08/11/20 [redacted]w[redacted]d  1474 g (3 lb 4 oz) M CS-Classical Spinal, EPI N LIV       Past Medical History[1]    Past Surgical History[2]    Family History[3]     Short Social History[4]    Current Medications[5]    Allergies[6]    REVIEW OF SYSTEMS: Negative upon 10 system review other than what is documented in HPI    OBJECTIVE   BP 127/76  - Pulse 80  - Temp 36.9 ??C (98.4 ??F)  - Wt (!) 181.7 kg (400 lb 9.6 oz)  - LMP 08/24/2023 (Approximate)  - BMI 70.96 kg/m??      Total weight gain: 13.9 kg (30 lb 9.6 oz)    General: well-appearing and in NAD      Abdomen: soft, NT  Pelvic:  normal external genitalia, BUS negative, cervix is without lesions, gravid NT uterus, no CMT, cervix long/closed, no adnexal masses or tenderness  Psych: pleasant and interactive, answers questions appropriately    {fetal assessment:92298}    A chaperone was present for any sensitive portions of the exam (if performed) including but not limited to, breast and pelvic examination.    POCT   ***  Results for orders placed or performed in visit on 11/08/23   POCT Urinalysis Dipstick   Result Value Ref Range    Spec Gravity/POC 1.015 1.003 - 1.030    PH/POC 6.0 5.0 - 9.0    Leuk Esterase/POC Trace (A) Negative    Nitrite/POC Negative Negative    Protein/POC Negative Negative    UA Glucose/POC Negative Negative    Ketones, POC Negative Negative    Bilirubin/POC Negative Negative    Blood/POC Negative Negative    Urobilinogen/POC 0.2 0.2 - 1.0 mg/dL     *Note: Due to a large number of results and/or encounters for the requested time period, some results have not been displayed. A complete set of results can be found in Results Review.                [1]   Past Medical History:  Diagnosis Date    Anemia     Anxiety     Asthma (HHS-HCC)     Bipolar 1 disorder    (CMS-HCC)     Delayed emergence from general anesthesia     Depression     Disease of thyroid gland     Eczema     GBS bacteriuria 08/21/2021    PCN in  labor    GERD (gastroesophageal reflux disease)     Headache     Hemorrhoids     History of pre-eclampsia in prior pregnancy, currently pregnant (HHS-HCC)     History of transfusion     Hypertension     IBS (irritable bowel syndrome)     IBS (irritable bowel syndrome)     Major depressive disorder     Migraine     Nexplanon insertion 06/16/2021    Inserted 09/01/21     Obesity     PONV (postoperative nausea and vomiting)     Postpartum depression     Psoriasis     Psychiatric Hospitalizations     PTSD (post-traumatic stress disorder)     Restless leg syndrome     Rh negative state in antepartum period (HHS-HCC) 05/31/2020    08/13/2021 rhogam given at [redacted]w[redacted]d     Seizures    (CMS-HCC) 05/13/2023    Due to severe preeclampsia    Self-injurious behavior     Sleep apnea, obstructive     Superimposed preeclampsia with severe features 05/16/2013    [x]  LDASA - On ASA for stent   [x]  baseline pre-e labs, if elevated spot of DM dx consider 24 hour urine  [x]  baseline EKG/TTE - followed by Cards, most recent echo 09/2020 with LVEF 55%  [x]  Fetal anatomic survey at 19-[redacted] weeks gestation  [x]  Serial growth ultrasounds every 4 weeks beginning at [redacted] weeks gestation if well controlled. If poorly controlled, recommend growth US  every 3-4 weeks from ti    Supervision of high risk pregnancy in third trimester (HHS-HCC) 03/24/2021    Dating: L=6                                           First trimester:   [x]  Prenatal labs reviewed   [ ]  SMA/Hgb electrophoresis/CF    [x]  Early 1 hr GTT*   [x]  Baseline HELLP*  [x]  LDASA* - on ASA and plavix  for venous stent   Third trimester:   [x]  CBC/RPR/HIV  [x]  1hr GTT  [x]  GBS+  [o] Anesthesia consult*  Fetal screening:    [x]  Anatomy US   [-] Fetal echo*  [o] Fetal growth US  every 4 weeks     Trauma     Urinary tract infection     more than 5 in lifetime    Varicella    [2]   Past Surgical History:  Procedure Laterality Date    AN ARTERIAL LINE  05/12/2023    BRAIN SURGERY  11/27/20    CESAREAN SECTION  08/11/20- 09/01/21-05/12/23    CHOLECYSTECTOMY      EYE SURGERY  2022    NASAL SEPTUM SURGERY      OTHER SURGICAL HISTORY      Gallbladder, vp shunt, tonsils and adenoids and deviated, septum  and wisdom teeth and 4 stents in the brain    PR CESAREAN DELIVERY ONLY N/A 08/11/2020    Procedure: CESAREAN DELIVERY ONLY;  Surgeon: Lauraine Almarie Pizza, MD;  Location: L&D C-SECTION OR SUITES [facility];  Service: Maternal-Fetal Medicine    PR CESAREAN DELIVERY ONLY N/A 09/01/2021    Procedure: CESAREAN DELIVERY ONLY;  Surgeon: Genevia GORMAN Lav, MD;  Location: L&D C-SECTION OR SUITES [facility];  Service: Obstetrics    PR CESAREAN DELIVERY ONLY N/A 05/12/2023    Procedure: CESAREAN DELIVERY ONLY;  Surgeon: Manda Almarie  McPhillips, MD;  Location: L&D C-SECTION OR SUITES [facility];  Service: Obstetrics    PR DECOMPRESS OPTIC NERVE Bilateral 08/04/2020    Procedure: OPTIC NERV DECOMP;  Surgeon: Lonni JINNY Maillard, MD;  Location: MAIN OR [facility];  Service: Ophthalmology    TONSILLECTOMY AND ADENOIDECTOMY      WISDOM TOOTH EXTRACTION     [3]   Family History  Problem Relation Age of Onset    Multiple sclerosis Mother     Heart disease Mother     Depression Mother     Hypertension Mother     Retinal detachment Mother     Arthritis Mother     Mental illness Mother     Miscarriages / [place] Mother     Bipolar disorder Father     Diabetes Father     Hypertension Father     Alcohol abuse Father     Drug abuse Father     Depression Father     Mental illness Father     Diabetes Maternal Grandmother     Heart disease Maternal Grandmother     Hypertension Maternal Grandmother     Depression Maternal Grandmother     Clotting disorder Maternal Grandmother         Pulmonary embolism    Mental illness Maternal Grandmother     Aneurysm Maternal Grandmother     Heart disease Maternal Grandfather         sudden death age 10    Diabetes Maternal Grandfather     Hypertension Maternal Grandfather     Hypertension Paternal Grandmother     Diabetes Paternal Grandmother     Diabetes Paternal Grandfather     Hypertension Paternal Grandfather     Cancer Other     Mental illness Other     Melanoma Neg Hx     Basal cell carcinoma Neg Hx     Squamous cell carcinoma Neg Hx     Glaucoma Neg Hx     Macular degeneration Neg Hx    [4]   Social History  Tobacco Use    Smoking status: Never    Smokeless tobacco: Never   Vaping Use    Vaping status: Never Used   Substance Use Topics    Alcohol use: Not Currently    Drug use: Never   [5]   Current Outpatient Medications   Medication Sig Dispense Refill    acetaminophen  (TYLENOL ) 325 MG tablet Take 2 tablets (650 mg total) by mouth every six (6) hours. (Patient taking differently: Take 2 tablets (650 mg total) by mouth continuous as needed.) 90 tablet 0    aspirin  (ECOTRIN) 81 MG tablet Take 1 tablet (81 mg total) by mouth daily.      buPROPion  (WELLBUTRIN  XL) 300 MG 24 hr tablet Take 1 tablet (300 mg total) by mouth daily. (Patient taking differently: Take 1 tablet (300 mg total) by mouth in the morning. Taking 450mg  daily.)      CIMZIA  400 mg/2 mL (200 mg/mL x 2) SyKt injection syringe Inject the contents of 2 syringes (400 mg total) under the skin every fourteen (14) days. 2 each 1    cyclobenzaprine  (FLEXERIL ) 10 MG tablet Take 1 tablet (10 mg total) by mouth nightly. 30 tablet 3    DERMA-SMOOTHE /FS SCALP OIL 0.01 % external oil APPLY TO SCALP THREE nights PER WEEK AS NEEDED (Patient taking differently: daily as needed.)      docusate sodium  (COLACE) 100 MG capsule Take 1 capsule (100 mg  total) by mouth two (2) times a day. 60 capsule 11    furosemide  (LASIX ) 20 MG tablet Take 1 tablet (20 mg total) by mouth daily for 2 days. 2 tablet 0    labetalol  (NORMODYNE ) 200 MG tablet Take 3 tablets (600 mg total) by mouth two (2) times a day. 180 tablet 11    levothyroxine  (SYNTHROID ) 75 MCG tablet Take 1 tablet (75 mcg total) by mouth daily. 90 tablet 2    magnesium  oxide (MAG-OX) 400 mg (241.3 mg elemental magnesium ) tablet Take 1 tablet by mouth daily.      montelukast  (SINGULAIR ) 10 mg tablet Take 1 tablet (10 mg total) by mouth nightly. 30 tablet 0    multivitamin, prenatal, folic acid-iron , (PRENATAL VITAMIN PLUS LOW IRON ) 27-1 mg Tab Take 1 tablet by mouth daily. 30 tablet 4    pantoprazole  (PROTONIX ) 40 MG tablet Take 1 tablet (40 mg total) by mouth daily.      sertraline  (ZOLOFT ) 100 MG tablet Take 2 and 1/2 tablets (250 mg total) by mouth daily. 75 tablet 3    solifenacin (VESICARE) 5 MG tablet Take 1 tablet (5 mg total) by mouth every morning.      blood pressure monitor Kit 46cm arm circumference    [address]   apt d   [place] [address]      Medicaid #-[id number] T (Patient not taking: Reported on 09/30/2023) 1 kit 0    buPROPion  (WELLBUTRIN  XL) 150 MG 24 hr tablet Take 3 tablets (450 mg total) by mouth every morning. 90 tablet 4    empty container Misc Use as directed to dispose of Cimzia  syringes. 1 each 2     No current facility-administered medications for this visit.   [6]   Allergies  Allergen Reactions    Adhesive Rash     Other reaction(s): Rash   Other reaction(s): Rash   Other reaction(s): Rash    Other reaction(s): RashOther reaction(s): RashOther reaction(s): Rash    Adhesive Tape-Silicones Rash     Other reaction(s): Rash Other reaction(s): Rash Other reaction(s): Rash  Other reaction(s): RashOther reaction(s): RashOther reaction(s): Rash      Other reaction(s): Rash, Other reaction(s): Rash, Other reaction(s): Rash    Etanercept Nausea And Vomiting     Unsure re reaction    Latex Hives    Mold Other (See Comments) and Anaphylaxis     allergy test     allergy test    Other Anaphylaxis, Other (See Comments) and Rash     Other reaction(s): Rash    Must wear at least sterling silver jewelry    Other reaction(s): Rash   Must wear at least sterling silver jewelry   Other reaction(s): Rash   Must wear at least sterling silver jewelry   Other reaction(s): Other (See Comments)   Other reaction(s): Rash   Must wear at least sterling silver jewelry    Other reaction(s): Other (See Comments)   Other reaction(s): Rash   Must wear at least sterling silver jewelry    Other reaction(s): Other (See Comments)   Other reaction(s): Rash   Must wear at least sterling silver jewelry   Other reaction(s): Rash   Must wear at least sterling silver jewelry   Other reaction(s): Rash   Must wear at least sterling silver jewelry   Other reaction(s): Other (See Comments)   Other reaction(s): Rash   Must wear at least sterling silver jewelry    Other reaction(s): Other (See Comments)Other reaction(s): RashMust  wear at least sterling silver jewelryOther reaction(s): RashMust wear at least sterling silver jewelryOther reaction(s): RashMust wear at least sterling silver jewelryOther reaction(s): Other (See Comments)Other reaction(s): RashMust wear at least sterling silver jewelry    Other reaction(s): Rash Other reaction(s): Rash Other reaction(s): Rash  Other reaction(s): RashOther reaction(s): RashOther reaction(s): Rash  Other reaction(s): Rash, Other reaction(s): Rash, Other reaction(s): Rash    Tramadol Rash and Itching     Pt reports itching as well    Other reaction(s): Rash    Wound Dressings Rash     Other reaction(s): Rash Other reaction(s): Rash Other reaction(s): Rash  Other reaction(s): RashOther reaction(s): RashOther reaction(s): Rash  Other reaction(s): Rash, Other reaction(s): Rash, Other reaction(s): Rash    Ziprasidone Nausea And Vomiting     Unsure of reaction      Unsure of reaction  Unk reaction      Unsure of reaction  Unk reaction  Unsure of reaction    Unsure of reaction  Unk reaction      Unsure of reaction    Unknown reaction    Unsure of reaction  Unk reaction  Unsure of reaction      Unsure of reaction  Unsure of reaction  Unk reaction  Unsure of reaction  Unk reaction  Unsure of reaction      Unsure of reaction  Unk reaction    Ziprasidone Hcl      Unsure of reaction    Unk reaction    Ziprasidone Mesylate

## 2023-11-08 ENCOUNTER — Encounter: Admit: 2023-11-08 | Discharge: 2023-11-08 | Payer: Medicaid (Managed Care)

## 2023-11-08 DIAGNOSIS — I10 Essential (primary) hypertension: Principal | ICD-10-CM

## 2023-11-08 DIAGNOSIS — J45909 Unspecified asthma, uncomplicated: Principal | ICD-10-CM

## 2023-11-08 DIAGNOSIS — L409 Psoriasis, unspecified: Principal | ICD-10-CM

## 2023-11-08 DIAGNOSIS — G932 Benign intracranial hypertension: Principal | ICD-10-CM

## 2023-11-08 DIAGNOSIS — G4733 Obstructive sleep apnea (adult) (pediatric): Principal | ICD-10-CM

## 2023-11-08 DIAGNOSIS — Z1389 Encounter for screening for other disorder: Principal | ICD-10-CM

## 2023-11-08 DIAGNOSIS — O99210 Obesity complicating pregnancy, unspecified trimester: Principal | ICD-10-CM

## 2023-11-08 DIAGNOSIS — O99513 Diseases of the respiratory system complicating pregnancy, third trimester: Principal | ICD-10-CM

## 2023-11-08 DIAGNOSIS — O0991 Supervision of high risk pregnancy, unspecified, first trimester: Principal | ICD-10-CM

## 2023-11-08 LAB — PROTEIN / CREATININE RATIO, URINE
CREATININE, URINE: 27.5 mg/dL
PROTEIN URINE: <6 mg/dL

## 2023-11-08 LAB — AST: AST (SGOT): 14 U/L (ref <=34)

## 2023-11-08 LAB — HEMOGLOBIN A1C
ESTIMATED AVERAGE GLUCOSE: 88 mg/dL
HEMOGLOBIN A1C: 4.7 % — ABNORMAL LOW (ref 4.8–5.6)

## 2023-11-08 LAB — CBC
HEMATOCRIT: 35.2 % (ref 34.0–44.0)
HEMOGLOBIN: 11.8 g/dL (ref 11.3–14.9)
MEAN CORPUSCULAR HEMOGLOBIN CONC: 33.6 g/dL (ref 32.0–36.0)
MEAN CORPUSCULAR HEMOGLOBIN: 26.8 pg (ref 25.9–32.4)
MEAN CORPUSCULAR VOLUME: 79.6 fL (ref 77.6–95.7)
MEAN PLATELET VOLUME: 7.5 fL (ref 6.8–10.7)
PLATELET COUNT: 222 10*9/L (ref 150–450)
RED BLOOD CELL COUNT: 4.42 10*12/L (ref 3.95–5.13)
RED CELL DISTRIBUTION WIDTH: 15.4 % — ABNORMAL HIGH (ref 12.2–15.2)
WBC ADJUSTED: 9.9 10*9/L (ref 3.6–11.2)

## 2023-11-08 LAB — CREATININE
CREATININE: 0.49 mg/dL — ABNORMAL LOW (ref 0.55–1.02)
EGFR CKD-EPI (2021) FEMALE: >90 mL/min/1.73m2 (ref >=60)

## 2023-11-08 LAB — ALT: ALT (SGPT): 19 U/L (ref 10–49)

## 2023-11-08 MED ORDER — ASPIRIN 81 MG TABLET,DELAYED RELEASE
ORAL_TABLET | Freq: Every day | ORAL | 9 refills | 15.00000 days | Status: CP
Start: 2023-11-08 — End: ?

## 2023-11-08 MED ORDER — BUTALBITAL-ACETAMINOPHEN-CAFFEINE 50 MG-300 MG-40 MG CAPSULE
ORAL_CAPSULE | ORAL | 0 refills | 4.00000 days | Status: CP | PRN
Start: 2023-11-08 — End: ?

## 2023-11-08 NOTE — Unmapped (Signed)
 [facility]                                                                         New Prenatal Note  History present of Illness   Tara Christensen is an 28 y.o. [phone] at [redacted]w[redacted]d with Estimated Date of Delivery: 06/07/24, who is seen in for a NOB visit.     History of Present Illness  She is overall feeling well. No concerns today. She desires new OB labs and NIPT today. She does not have vaginal bleeding.    Obstetrical History  OB History   Gravida Para Term Preterm AB Living   4 3 0 3 0 3   SAB IAB Ectopic Molar Multiple Live Births   0 0 0  0 3      # Outcome Date GA Lbr Len/2nd Weight Sex Type Anes PTL Lv   4 Current            3 Preterm 05/12/23 [redacted]w[redacted]d  1880 g (4 lb 2.3 oz) M CS-Unspec Spinal, EPI Y LIV      Complications: Severe Preeclampsia   2 Preterm 09/01/21 [redacted]w[redacted]d  1570 g (3 lb 7.4 oz) F CS-Unspec Spinal, EPI Y LIV      Complications: Severe Preeclampsia   1 Preterm 08/11/20 [redacted]w[redacted]d  1474 g (3 lb 4 oz) M CS-Classical Spinal, EPI N LIV       Gynecologic History:   No hx of abnormal paps, last pap smear 2022 in system (NILM) and patient reports Pap in 2024 that was normal.    Past Medical History  Past Medical History[1]    Past Surgical History  Past Surgical History[2]    Family History  Family History[3]    Social History  Short Social History[4]    Medications  Active Medications[5]    Allergies  Adhesive, Adhesive tape-silicones, Etanercept, Latex, Mold, Other, Tramadol, Wound dressings, Ziprasidone, Ziprasidone hcl, and Ziprasidone mesylate    Review of Systems  A complete ROS was performed with pertinent positives/negatives noted in the HPI. All other systems reviewed were negative.    Objective  Temp: 36.9 ??C (98.4 ??F)  Pulse: 80  BP: 127/76  Movement: Present  Loss of Fluid: No  Bleeding: No  Contractions: Not present    Physical Exam        General Appearance No acute distress, well appearing, and well nourished.   Pulmonary Normal work of breathing.   Cardiovascular Normal rate.        Breast  Gastronintestinal Deferred  Abdomen soft, gravid, no rebound or guarding.   Genitourinary  SVE deferred        Muskuloskeletal Normal gait, grossly normal range of motion   Neurologic Alert and oriented to person, place, and time   Psychiatric  Integumentary Mood and affect within normal limits  Skin is warm, dry, no rash noted     Results         Imaging  +FHT on bedside scan    Laboratory results from this encounter  Results for orders placed or performed in visit on 11/08/23   POCT Urinalysis Dipstick   Result Value Ref Range    Spec Gravity/POC 1.015 1.003 - 1.030  PH/POC 6.0 5.0 - 9.0    Leuk Esterase/POC Trace (A) Negative    Nitrite/POC Negative Negative    Protein/POC Negative Negative    UA Glucose/POC Negative Negative    Ketones, POC Negative Negative    Bilirubin/POC Negative Negative    Blood/POC Negative Negative    Urobilinogen/POC 0.2 0.2 - 1.0 mg/dL       Results in Past 639 Days  Result Component Current Result   Blood Type O NEG (05/14/2023)   Select Screen 5 NEG (05/14/2023)       Results in Past 360 Days  Result Component Current Result   RPR Nonreactive (05/03/2023)       Assessment and Plan:  Tara Christensen is a 28 y.o. [phone] at [redacted]w[redacted]d who is seen to establish prenatal care.     I reviewed model of care with patient including team based care with attendings and residents.  Assessment & Plan  Chronic hypertension  Currently on Labetalol  600mg  BID + hydralazine  50mg  q6 hours  Not currently taking BP at home  Follows with Dr. MARLA at [facility] and HELLP labs ordered today  Obstructive sleep apnea  Had sleep apnea test repeated > CPAP no longer needed  Supervision of high risk pregnancy in first trimester (HHS-HCC)  +FHT tones  First trimester labs drawn  NIPT drawn - patient aware might need to be repeated due to GA, and she is okay with drawing today with possible re-draw in the future    Severe persistent asthma  Currently taking singulair  daily. Using nebulizer treatment a couple times a week.  Idiopathic intracranial hypertension  Dr Kassie appt scheduled in November- wants patient to see eye doctor first. Working on scheduling appt with eye doctor. She is not currently taking diamox  2/2 dehydration - self stopped. Shunt still in place  BMI >60  A1c ordered today    Encounter for screening for other disorder    Psoriasis  Is still taking cimzia       Anticipatory guidance reviewed. We discussed how our MFM practice works, delivery at [facility] in [place], and the role of residents and students.    Schedule follow up (ideally with me) in 1,3,4,5,6,7,8,9 weeks, anatomy scan in ~ 9 weeks    Pt would like to be scheduled with Dr. Thaddeus @ [facility] on 10/16    If I am not available any week pt prefers Dr. Thaddeus    This encounter was based on time. I personally spent 35 minutes face-to-face and non-face-to-face in the care of this patient, which includes all pre, intra, and post visit time on the date of service.     This patient was discussed and seen with Dr. Dominique, who is in agreement with the above recommendations.    Ellen Goris K Bogdan Vivona, MD           [1]   Past Medical History:  Diagnosis Date    Anemia     Anxiety     Asthma (HHS-HCC)     Bipolar 1 disorder    (CMS-HCC)     Delayed emergence from general anesthesia     Depression     Disease of thyroid gland     Eczema     GBS bacteriuria 08/21/2021    PCN in labor    GERD (gastroesophageal reflux disease)     Headache     Hemorrhoids     History of pre-eclampsia in prior pregnancy, currently pregnant (HHS-HCC)  History of transfusion     Hypertension     IBS (irritable bowel syndrome)     IBS (irritable bowel syndrome)     Major depressive disorder     Migraine     Nexplanon insertion 06/16/2021    Inserted 09/01/21     Obesity     PONV (postoperative nausea and vomiting)     Postpartum depression     Psoriasis     Psychiatric Hospitalizations     PTSD (post-traumatic stress disorder)     Restless leg syndrome     Rh negative state in antepartum period (HHS-HCC) 05/31/2020    08/13/2021 rhogam given at 103w3d     Seizures    (CMS-HCC) 05/13/2023    Due to severe preeclampsia    Self-injurious behavior     Sleep apnea, obstructive     Superimposed preeclampsia with severe features 05/16/2013    [x]  LDASA - On ASA for stent   [x]  baseline pre-e labs, if elevated spot of DM dx consider 24 hour urine  [x]  baseline EKG/TTE - followed by Cards, most recent echo 09/2020 with LVEF 55%  [x]  Fetal anatomic survey at 19-[redacted] weeks gestation  [x]  Serial growth ultrasounds every 4 weeks beginning at [redacted] weeks gestation if well controlled. If poorly controlled, recommend growth US  every 3-4 weeks from ti    Supervision of high risk pregnancy in third trimester (HHS-HCC) 03/24/2021    Dating: L=6                                           First trimester:   [x]  Prenatal labs reviewed   [ ]  SMA/Hgb electrophoresis/CF    [x]  Early 1 hr GTT*   [x]  Baseline HELLP*  [x]  LDASA* - on ASA and plavix  for venous stent   Third trimester:   [x]  CBC/RPR/HIV  [x]  1hr GTT  [x]  GBS+  [o] Anesthesia consult*  Fetal screening:    [x]  Anatomy US   [-] Fetal echo*  [o] Fetal growth US  every 4 weeks     Trauma     Urinary tract infection     more than 5 in lifetime    Varicella    [2]   Past Surgical History:  Procedure Laterality Date    AN ARTERIAL LINE  05/12/2023    BRAIN SURGERY  11/27/20    CESAREAN SECTION  08/11/20- 09/01/21-05/12/23    CHOLECYSTECTOMY      EYE SURGERY  2022    NASAL SEPTUM SURGERY      OTHER SURGICAL HISTORY      Gallbladder, vp shunt, tonsils and adenoids and deviated, septum  and wisdom teeth and 4 stents in the brain    PR CESAREAN DELIVERY ONLY N/A 08/11/2020    Procedure: CESAREAN DELIVERY ONLY;  Surgeon: Lauraine Almarie Pizza, MD;  Location: L&D C-SECTION OR SUITES [facility];  Service: Maternal-Fetal Medicine    PR CESAREAN DELIVERY ONLY N/A 09/01/2021    Procedure: CESAREAN DELIVERY ONLY;  Surgeon: Genevia GORMAN Lav, MD;  Location: L&D C-SECTION OR SUITES [facility];  Service: Obstetrics    PR CESAREAN DELIVERY ONLY N/A 05/12/2023    Procedure: CESAREAN DELIVERY ONLY;  Surgeon: Manda Almarie Raveling, MD;  Location: L&D C-SECTION OR SUITES [facility];  Service: Obstetrics    PR DECOMPRESS OPTIC NERVE Bilateral 08/04/2020    Procedure: OPTIC NERV DECOMP;  Surgeon: Lonni JINNY Maillard, MD;  Location: MAIN OR [facility];  Service: Ophthalmology    TONSILLECTOMY AND ADENOIDECTOMY      WISDOM TOOTH EXTRACTION     [3]   Family History  Problem Relation Age of Onset    Multiple sclerosis Mother     Heart disease Mother     Depression Mother     Hypertension Mother     Retinal detachment Mother     Arthritis Mother     Mental illness Mother     Miscarriages / [place] Mother     Bipolar disorder Father     Diabetes Father     Hypertension Father     Alcohol abuse Father     Drug abuse Father     Depression Father     Mental illness Father     Diabetes Maternal Grandmother     Heart disease Maternal Grandmother     Hypertension Maternal Grandmother     Depression Maternal Grandmother     Clotting disorder Maternal Grandmother         Pulmonary embolism    Mental illness Maternal Grandmother     Aneurysm Maternal Grandmother     Heart disease Maternal Grandfather         sudden death age 92    Diabetes Maternal Grandfather     Hypertension Maternal Grandfather     Hypertension Paternal Grandmother     Diabetes Paternal Grandmother     Diabetes Paternal Grandfather     Hypertension Paternal Grandfather     Cancer Other     Mental illness Other     Melanoma Neg Hx     Basal cell carcinoma Neg Hx     Squamous cell carcinoma Neg Hx     Glaucoma Neg Hx     Macular degeneration Neg Hx    [4]   Social History  Tobacco Use    Smoking status: Never    Smokeless tobacco: Never   Vaping Use    Vaping status: Never Used   Substance Use Topics    Alcohol use: Not Currently    Drug use: Never   [5]   Current Outpatient Medications   Medication Sig Dispense Refill    acetaminophen  (TYLENOL ) 325 MG tablet Take 2 tablets (650 mg total) by mouth every six (6) hours. (Patient taking differently: Take 2 tablets (650 mg total) by mouth continuous as needed.) 90 tablet 0    buPROPion  (WELLBUTRIN  XL) 300 MG 24 hr tablet Take 1 tablet (300 mg total) by mouth daily. (Patient taking differently: Take 1 tablet (300 mg total) by mouth in the morning. Taking 450mg  daily.)      CIMZIA  400 mg/2 mL (200 mg/mL x 2) SyKt injection syringe Inject the contents of 2 syringes (400 mg total) under the skin every fourteen (14) days. 2 each 1    cyclobenzaprine  (FLEXERIL ) 10 MG tablet Take 1 tablet (10 mg total) by mouth nightly. 30 tablet 3    DERMA-SMOOTHE /FS SCALP OIL 0.01 % external oil APPLY TO SCALP THREE nights PER WEEK AS NEEDED (Patient taking differently: daily as needed.)      docusate sodium  (COLACE) 100 MG capsule Take 1 capsule (100 mg total) by mouth two (2) times a day. 60 capsule 11    furosemide  (LASIX ) 20 MG tablet Take 1 tablet (20 mg total) by mouth daily for 2 days. 2 tablet 0    labetalol  (NORMODYNE ) 200 MG tablet Take 3 tablets (600 mg total) by mouth two (2) times a  day. 180 tablet 11    levothyroxine  (SYNTHROID ) 75 MCG tablet Take 1 tablet (75 mcg total) by mouth daily. 90 tablet 2    magnesium  oxide (MAG-OX) 400 mg (241.3 mg elemental magnesium ) tablet Take 1 tablet by mouth daily.      montelukast  (SINGULAIR ) 10 mg tablet Take 1 tablet (10 mg total) by mouth nightly. 30 tablet 0    multivitamin, prenatal, folic acid-iron , (PRENATAL VITAMIN PLUS LOW IRON ) 27-1 mg Tab Take 1 tablet by mouth daily. 30 tablet 4    pantoprazole  (PROTONIX ) 40 MG tablet Take 1 tablet (40 mg total) by mouth daily.      sertraline  (ZOLOFT ) 100 MG tablet Take 2 and 1/2 tablets (250 mg total) by mouth daily. 75 tablet 3    solifenacin (VESICARE) 5 MG tablet Take 1 tablet (5 mg total) by mouth every morning.      aspirin  (ECOTRIN) 81 MG tablet Take 2 tablets (162 mg total) by mouth daily. 30 tablet 9    blood pressure monitor Kit 46cm arm circumference    [address]   apt d   [place] [address]      Medicaid #-[id number] T (Patient not taking: Reported on 09/30/2023) 1 kit 0    buPROPion  (WELLBUTRIN  XL) 150 MG 24 hr tablet Take 3 tablets (450 mg total) by mouth every morning. 90 tablet 4    butalbital -acetaminophen -caff (FIORICET ) 50-300-40 mg cap Take 1 capsule by mouth every four (4) hours as needed for headache. Take 1-2 tabs po as needed for headache 20 capsule 0    empty container Misc Use as directed to dispose of Cimzia  syringes. 1 each 2     No current facility-administered medications for this visit.

## 2023-11-08 NOTE — Unmapped (Addendum)
+  FHT tones  First trimester labs drawn  NIPT drawn - patient aware might need to be repeated due to GA, and she is okay with drawing today with possible re-draw in the future

## 2023-11-08 NOTE — Unmapped (Addendum)
A1c ordered today

## 2023-11-08 NOTE — Unmapped (Signed)
 Patient completed EPDS of 18 and GAD7 of 19 at visit today.

## 2023-11-08 NOTE — Unmapped (Addendum)
 Had sleep apnea test repeated > CPAP no longer needed

## 2023-11-08 NOTE — Unmapped (Addendum)
 Currently taking singulair  daily. Using nebulizer treatment a couple times a week.

## 2023-11-08 NOTE — Unmapped (Addendum)
 Is still taking cimzia 

## 2023-11-08 NOTE — Unmapped (Addendum)
 Dr Kassie appt scheduled in November- wants patient to see eye doctor first. Working on scheduling appt with eye doctor. She is not currently taking diamox  2/2 dehydration - self stopped. Shunt still in place

## 2023-11-08 NOTE — Progress Notes (Signed)
 CARE Coordination Note:    Tara Christensen is participating in the [facility]. On 11-08-2023, CARE team services were provided in-person at [place].    Topics reviewed / addressed included: Initial contact, Welcome message  Notes: Saw patient for Enrollment Visit/Visit 1 today. Approached and enrolled patient and explained that as Care Coordinators, we are able to help with things like providing emotional and informational support, helping with appointment scheduling, having conversations with their providers, medication/BP/BG reminders, referrals, and more resources surrounding L&D, doulas, and more.    Inquired about the needs of the patient at the current moment. Patient stated that she had no needs today. Will follow up with patient in one week via text.    Inocente Sprung  CARE Clinical Initiative for Prematurity Prevention

## 2023-11-09 LAB — HEPATITIS C ANTIBODY
HCV S/CO VALUE: 0.11
HEPATITIS C ANTIBODY: NONREACTIVE

## 2023-11-09 LAB — SYPHILIS SCREEN: SYPHILIS RPR SCREEN: NONREACTIVE

## 2023-11-09 LAB — HIV ANTIGEN/ANTIBODY COMBO: HIV ANTIGEN/ANTIBODY COMBO: NONREACTIVE

## 2023-11-09 LAB — RUBELLA ANTIBODY, IGG: RUBELLA IGG SCREEN: POSITIVE

## 2023-11-09 LAB — VARICELLA ZOSTER ANTIBODY, IGG: VARICELLA ZOSTER IGG: POSITIVE

## 2023-11-09 LAB — HEPATITIS B SURFACE ANTIGEN: HEPATITIS B SURFACE ANTIGEN: NONREACTIVE

## 2023-11-10 NOTE — Unmapped (Signed)
 Pt reports middle back pain that began Monday however states she has felt under the weather in the past 1-2 weeks after being around her children who were diagnosed with respiratory viruses. (+) SOB, headache, sweats, excess mucus production, and tiredness. Pt reports she had a shunt placed in April 2024 and was told to be evaluated whenever she felt sick. Of note, pt is [redacted] weeks pregnant.

## 2023-11-11 ENCOUNTER — Emergency Department: Admit: 2023-11-11 | Discharge: 2023-11-11 | Disposition: A | Discharge status: Home / Self Care | Payer: MEDICAID

## 2023-11-11 LAB — URINALYSIS WITH MICROSCOPY
BILIRUBIN UA: NEGATIVE
BLOOD UA: NEGATIVE
GLUCOSE UA: NEGATIVE
KETONES UA: NEGATIVE
LEUKOCYTE ESTERASE UA: NEGATIVE
NITRITE UA: NEGATIVE
PH UA: 6 (ref 5.0–7.0)
PROTEIN UA: NEGATIVE
RBC UA: 0 /HPF (ref 0–3)
SPECIFIC GRAVITY UA: 1.01 (ref 1.005–1.030)
SQUAMOUS EPITHELIAL: 2 /HPF (ref 0–5)
TRANSITIONAL EPITHELIAL: 1 /HPF (ref 0–2)
UROBILINOGEN UA: 0.2
WBC UA: 1 /HPF (ref 0–3)

## 2023-11-11 NOTE — Unmapped (Signed)
 [facility] Emergency Department Provider Note        ED Clinical Impression     Final diagnoses:   Lumbar strain, initial encounter (Primary)       Final ED Assessment/Plan     This definitely feels like a lumbar strain to me, reassuring vitals, reassuring physical exam.  At this time I am recommending continued heat, massage, but I am giving her some exercises and stretching to do as well.  Follow-up precautions given.      ED Course     2:14 AM I told her this is almost certainly a lumbar strain but I thought a urinalysis would be completely reasonable.  Will check a urine.     History     Chief Complaint    Cold Like Symptoms      HPI     Please note, this note was created using voice recognition technology.  I have made efforts to catch any phonetic/typographical errors but these may not always be found. Such errors, especially pronoun confusion, do not reflect on the standard of medical care.     Tara Christensen is a 28 y.o. female with a past medical history of migraines, IIH status post shunts, asthma, HTN, psoriasis, hypothyroidism, anxiety/depression/PTSD/self-injury/bipolar disorder, IBS, RLS, seizures, GERD, history of transfusion, BMI 68, currently 10 1/[redacted] weeks pregnant based on 8-week ultrasound with EDD of April 30, and here with back pain and cold-like symptoms.  Triage Note   10/02 20:37 Pt reports middle back pain that began Monday however states she has felt under the weather in the past 1-2 weeks after being around her children who were diagnosed with respiratory viruses. (+) SOB, headache, sweats, excess mucus production, and tiredness. Pt reports she had a shunt placed in April 2024 and was told to be evaluated whenever she felt sick. Of note, pt is [redacted] weeks pregnant.      1:56 AM patient seen with her boyfriend.  Says she was redated by her obstetrician because of the ultrasound finding.  She said they did see the heartbeat.    Says she is really here because of the low back pain, more on the right side.  She just woke up with it Monday morning, September 29.  She has had some sweats and some postnasal drainage but no real sore throat, runny nose or cough, fevers or chills.  She has had fatigue and malaise.  Her kids have all been sick with COVID and RSV.  Says she is having some urinary frequency but she thinks it is normal pregnancy urinary frequency, no dysuria.  No vaginal discharge.  No vaginal bleeding.  She says she has a mild headache, no chest pain.  Says she has been a little more short of breath lately and is out of her inhaler but has been using her nebulizer and this is helpful.  No abdominal pain.  Occasionally mild nausea.  Her bowel movements are mostly runny after she had her cholecystectomy, seems to be at baseline.  No hematochezia.  She did have an episode when she was in the bathroom earlier this evening where she felt a little off balance and it only lasted for a brief time and that is the only time she has had this.  No dizziness otherwise.  For her back pain she has been trying some heat and massage, Tylenol , nothing is helping.  The pain is worse with any movement.  She has no radiation into her legs, no numbness or  weakness.      Available old records in [place] were reviewed for this visit.     Past Medical History[1]    Past Surgical History[2]    No current facility-administered medications for this encounter.    Current Outpatient Medications:     acetaminophen  (TYLENOL ) 325 MG tablet, Take 2 tablets (650 mg total) by mouth every six (6) hours. (Patient taking differently: Take 2 tablets (650 mg total) by mouth continuous as needed.), Disp: 90 tablet, Rfl: 0    aspirin  (ECOTRIN) 81 MG tablet, Take 2 tablets (162 mg total) by mouth daily., Disp: 30 tablet, Rfl: 9    blood pressure monitor Kit, 46cm arm circumference  [address]  [address]  [place] [address]    Medicaid #-[id number] T (Patient not taking: Reported on 09/30/2023), Disp: 1 kit, Rfl: 0    buPROPion  (WELLBUTRIN  XL) 150 MG 24 hr tablet, Take 3 tablets (450 mg total) by mouth every morning., Disp: 90 tablet, Rfl: 4    buPROPion  (WELLBUTRIN  XL) 300 MG 24 hr tablet, Take 1 tablet (300 mg total) by mouth daily. (Patient taking differently: Take 1 tablet (300 mg total) by mouth in the morning. Taking 450mg  daily.), Disp: , Rfl:     butalbital -acetaminophen -caff (FIORICET ) 50-300-40 mg cap, Take 1 capsule by mouth every four (4) hours as needed for headache. Take 1-2 tabs po as needed for headache, Disp: 20 capsule, Rfl: 0    CIMZIA  400 mg/2 mL (200 mg/mL x 2) SyKt injection syringe, Inject the contents of 2 syringes (400 mg total) under the skin every fourteen (14) days., Disp: 2 each, Rfl: 1    cyclobenzaprine  (FLEXERIL ) 10 MG tablet, Take 1 tablet (10 mg total) by mouth nightly., Disp: 30 tablet, Rfl: 3    DERMA-SMOOTHE /FS SCALP OIL 0.01 % external oil, APPLY TO SCALP THREE nights PER WEEK AS NEEDED (Patient taking differently: daily as needed.), Disp: , Rfl:     docusate sodium  (COLACE) 100 MG capsule, Take 1 capsule (100 mg total) by mouth two (2) times a day., Disp: 60 capsule, Rfl: 11    empty container Misc, Use as directed to dispose of Cimzia  syringes., Disp: 1 each, Rfl: 2    furosemide  (LASIX ) 20 MG tablet, Take 1 tablet (20 mg total) by mouth daily for 2 days., Disp: 2 tablet, Rfl: 0    labetalol  (NORMODYNE ) 200 MG tablet, Take 3 tablets (600 mg total) by mouth two (2) times a day., Disp: 180 tablet, Rfl: 11    levothyroxine  (SYNTHROID ) 75 MCG tablet, Take 1 tablet (75 mcg total) by mouth daily., Disp: 90 tablet, Rfl: 2    magnesium  oxide (MAG-OX) 400 mg (241.3 mg elemental magnesium ) tablet, Take 1 tablet by mouth daily., Disp: , Rfl:     montelukast  (SINGULAIR ) 10 mg tablet, Take 1 tablet (10 mg total) by mouth nightly., Disp: 30 tablet, Rfl: 0    multivitamin, prenatal, folic acid-iron , (PRENATAL VITAMIN PLUS LOW IRON ) 27-1 mg Tab, Take 1 tablet by mouth daily., Disp: 30 tablet, Rfl: 4    pantoprazole  (PROTONIX ) 40 MG tablet, Take 1 tablet (40 mg total) by mouth daily., Disp: , Rfl:     sertraline  (ZOLOFT ) 100 MG tablet, Take 2 and 1/2 tablets (250 mg total) by mouth daily., Disp: 75 tablet, Rfl: 3    solifenacin (VESICARE) 5 MG tablet, Take 1 tablet (5 mg total) by mouth every morning., Disp: , Rfl:     Allergies  Adhesive, Adhesive tape-silicones, Etanercept, Latex, Mold,  Other, Tramadol, Wound dressings, Ziprasidone, Ziprasidone hcl, and Ziprasidone mesylate    Family History[3]    Social History  Short Social History[4]    Review of Systems  Review of Systems    Physical Exam     Vital Signs  BP 159/87  - Pulse 80  - Temp 36.4 ??C (97.5 ??F) (Temporal)  - Resp 17  - Ht 160 cm (5' 3)  - Wt (!) 176.4 kg (389 lb)  - LMP 08/24/2023 (Approximate)  - SpO2 97%  - BMI 68.91 kg/m??     Physical Exam  Physical Exam  Vitals and nursing note reviewed.   Constitutional:       General: She is not in acute distress.     Appearance: Normal appearance. She is not ill-appearing, toxic-appearing or diaphoretic.   HENT:      Right Ear: Tympanic membrane normal.      Left Ear: Tympanic membrane normal.      Nose: Nose normal.      Mouth/Throat:      Mouth: Mucous membranes are moist.      Comments: There is some postnasal drainage and some cobblestoning of the posterior pharynx  Eyes:      Conjunctiva/sclera: Conjunctivae normal.      Pupils: Pupils are equal, round, and reactive to light.      Comments: Optic discs are sharp bilaterally   Cardiovascular:      Rate and Rhythm: Normal rate and regular rhythm.      Heart sounds: Normal heart sounds.   Pulmonary:      Effort: Pulmonary effort is normal.      Comments: Markedly diminished breath sounds throughout all lung fields  Abdominal:      Palpations: Abdomen is soft.      Tenderness: There is no abdominal tenderness. There is no guarding or rebound.   Musculoskeletal:      Cervical back: Normal range of motion and neck supple.      Comments: Easily reproducible tenderness in the right paraspinal musculature with some spasm, in the mid lumbar region.  There is no midline tenderness to palpation or percussion.   Skin:     General: Skin is warm and dry.   Neurological:      General: No focal deficit present.      Mental Status: She is alert and oriented to person, place, and time.   Psychiatric:         Mood and Affect: Mood normal.         Pertinent EKG, Labs, and Radiology       Results for orders placed or performed during the hospital encounter of 11/10/23   Influenza/ RSV/COVID PCR    Specimen: Nasopharyngeal Swab   Result Value Ref Range    SARS-CoV-2 PCR Negative Negative    Influenza A Negative Negative    Influenza B Negative Negative    RSV Negative Negative   Urinalysis with Microscopy (Clean Catch)   Result Value Ref Range    Color, UA Yellow     Clarity, UA Clear     Specific Gravity, UA 1.010 1.005 - 1.030    pH, UA 6.0 5.0 - 7.0    Leukocyte Esterase, UA Negative Negative    Nitrite, UA Negative Negative    Protein, UA Negative Negative    Glucose, UA Negative Negative    Ketones, UA Negative Negative    Urobilinogen, UA 0.2 mg/dL     Bilirubin, UA Negative Negative  Blood, UA Negative Negative    RBC, UA 0 0 - 3 /HPF    WBC, UA 1 0 - 3 /HPF    Squam Epithel, UA 2 0 - 5 /HPF    Bacteria, UA Few (A) None Seen /HPF    Trans Epithel, UA 1 0 - 2 /HPF                  [1]   Past Medical History:  Diagnosis Date    Anemia     Anxiety     Asthma (HHS-HCC)     Bipolar 1 disorder    (CMS-HCC)     Delayed emergence from general anesthesia     Depression     Disease of thyroid gland     Eczema     GBS bacteriuria 08/21/2021    PCN in labor    GERD (gastroesophageal reflux disease)     Headache     Hemorrhoids     History of pre-eclampsia in prior pregnancy, currently pregnant (HHS-HCC)     History of transfusion     Hypertension     IBS (irritable bowel syndrome)     IBS (irritable bowel syndrome)     Major depressive disorder     Migraine     Nexplanon insertion 06/16/2021    Inserted 09/01/21     Obesity     PONV (postoperative nausea and vomiting)     Postpartum depression     Psoriasis     Psychiatric Hospitalizations     PTSD (post-traumatic stress disorder)     Restless leg syndrome     Rh negative state in antepartum period (HHS-HCC) 05/31/2020    08/13/2021 rhogam given at [redacted]w[redacted]d     Seizures    (CMS-HCC) 05/13/2023    Due to severe preeclampsia    Self-injurious behavior     Sleep apnea, obstructive     Superimposed preeclampsia with severe features 05/16/2013    [x]  LDASA - On ASA for stent   [x]  baseline pre-e labs, if elevated spot of DM dx consider 24 hour urine  [x]  baseline EKG/TTE - followed by Cards, most recent echo 09/2020 with LVEF 55%  [x]  Fetal anatomic survey at 19-[redacted] weeks gestation  [x]  Serial growth ultrasounds every 4 weeks beginning at [redacted] weeks gestation if well controlled. If poorly controlled, recommend growth US  every 3-4 weeks from ti    Supervision of high risk pregnancy in third trimester (HHS-HCC) 03/24/2021    Dating: L=6                                           First trimester:   [x]  Prenatal labs reviewed   [ ]  SMA/Hgb electrophoresis/CF    [x]  Early 1 hr GTT*   [x]  Baseline HELLP*  [x]  LDASA* - on ASA and plavix  for venous stent   Third trimester:   [x]  CBC/RPR/HIV  [x]  1hr GTT  [x]  GBS+  [o] Anesthesia consult*  Fetal screening:    [x]  Anatomy US   [-] Fetal echo*  [o] Fetal growth US  every 4 weeks     Trauma     Urinary tract infection     more than 5 in lifetime    Varicella    [2]   Past Surgical History:  Procedure Laterality Date    AN ARTERIAL LINE  05/12/2023    BRAIN  SURGERY  11/27/20    CESAREAN SECTION  08/11/20- 09/01/21-05/12/23    CHOLECYSTECTOMY      EYE SURGERY  2022    NASAL SEPTUM SURGERY      OTHER SURGICAL HISTORY      Gallbladder, vp shunt, tonsils and adenoids and deviated, septum  and wisdom teeth and 4 stents in the brain    PR CESAREAN DELIVERY ONLY N/A 08/11/2020    Procedure: CESAREAN DELIVERY ONLY;  Surgeon: Lauraine Almarie Pizza, MD; Location: L&D C-SECTION OR SUITES [facility];  Service: Maternal-Fetal Medicine    PR CESAREAN DELIVERY ONLY N/A 09/01/2021    Procedure: CESAREAN DELIVERY ONLY;  Surgeon: Genevia GORMAN Lav, MD;  Location: L&D C-SECTION OR SUITES [facility];  Service: Obstetrics    PR CESAREAN DELIVERY ONLY N/A 05/12/2023    Procedure: CESAREAN DELIVERY ONLY;  Surgeon: Manda Almarie Raveling, MD;  Location: L&D C-SECTION OR SUITES [facility];  Service: Obstetrics    PR DECOMPRESS OPTIC NERVE Bilateral 08/04/2020    Procedure: OPTIC NERV DECOMP;  Surgeon: Lonni JINNY Maillard, MD;  Location: MAIN OR [facility];  Service: Ophthalmology    TONSILLECTOMY AND ADENOIDECTOMY      WISDOM TOOTH EXTRACTION     [3]   Family History  Problem Relation Age of Onset    Multiple sclerosis Mother     Heart disease Mother     Depression Mother     Hypertension Mother     Retinal detachment Mother     Arthritis Mother     Mental illness Mother     Miscarriages / [place] Mother     Bipolar disorder Father     Diabetes Father     Hypertension Father     Alcohol abuse Father     Drug abuse Father     Depression Father     Mental illness Father     Diabetes Maternal Grandmother     Heart disease Maternal Grandmother     Hypertension Maternal Grandmother     Depression Maternal Grandmother     Clotting disorder Maternal Grandmother         Pulmonary embolism    Mental illness Maternal Grandmother     Aneurysm Maternal Grandmother     Heart disease Maternal Grandfather         sudden death age 18    Diabetes Maternal Grandfather     Hypertension Maternal Grandfather     Hypertension Paternal Grandmother     Diabetes Paternal Grandmother     Diabetes Paternal Grandfather     Hypertension Paternal Grandfather     Cancer Other     Mental illness Other     Melanoma Neg Hx     Basal cell carcinoma Neg Hx     Squamous cell carcinoma Neg Hx     Glaucoma Neg Hx     Macular degeneration Neg Hx    [4]   Social History  Tobacco Use    Smoking status: Never    Smokeless tobacco: Never Vaping Use    Vaping status: Never Used   Substance Use Topics    Alcohol use: Not Currently    Drug use: Never        Perla Ozell Hamilton, MD  11/11/23 [phone]

## 2023-11-17 DIAGNOSIS — H543 Unqualified visual loss, both eyes: Principal | ICD-10-CM

## 2023-11-17 DIAGNOSIS — G932 Benign intracranial hypertension: Principal | ICD-10-CM

## 2023-11-17 NOTE — Unmapped (Signed)
 Tara Christensen is a 28 y.o. y/o female, who presents to neuro-ophthalmology clinic on November 17, 2023 due to [place] vision OD.    REFERRED BY: Dallas Busman, MD- Neurology    Patient Care Team:  Lattimore, Sharlet Dubonnet, FNP as PCP - General (Family Medicine)  Lattimore, Sharlet Dubonnet, FNP as PCP - Deberah Dutch, Pecola Massa, PA as Physician Assistant (Obstetrics and Gynecology)  Ledon Sandhoff, MD PhD (Ophthalmology)  Dude, Jenkins Setter, MD (Obstetrics and Gynecology)    Doctors who need letters: Dallas Busman, MD  Does the patient need a DMV form or a school or work note? NO  Does the patient want an eyeglasses prescription? YES    Ophthalmic Technician preparing this note: TFLEMMINGS.    REASON FOR VISIT :    Last pregnancy c/b eclampsia. Feels vision has worsened since. Headaches not as bad a previously. Usually every other day, lasting couple hours to days. Primarily occipital. During headaches sees shadows in vision. No flashes of light. Feels like both eyes have pressure. Pulsatile tinnitus 1-2x/week. No scotomas, TVOs.    Currently [redacted] weeks pregnant. Feels dizzy/nauseated at times. Is using large font on phone to see.     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   - 08/03/21: eyes pain in the front and back frequently, seeing black shadows, not sleeping well  - 12/28/22: Patient is currently [redacted] weeks pregnant. She has been off of diamox  (1,000mg  daily) for the last 13 weeks. She has been having occipital headaches that get worse with laying down, associated with blurry vision, pulsatile tinnitus. She denies double vision.  - 11/17/2023: [redacted] weeks pregnant. normal neuro ophthalmological exam. Blurry vision not explained by ocular exam. 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/2020 and cerebral venous stent placement 02/20/2021    PMHx:  - She  has a past medical history of Anemia, Anxiety, Asthma (HHS-HCC), Bipolar 1 disorder    (CMS-HCC), Delayed emergence from general anesthesia, Depression, Disease of thyroid gland, Eczema, GBS bacteriuria (08/21/2021), GERD (gastroesophageal reflux disease), Headache, Hemorrhoids, History of pre-eclampsia in prior pregnancy, currently pregnant (HHS-HCC), History of transfusion, Hypertension, IBS (irritable bowel syndrome), IBS (irritable bowel syndrome), Major depressive disorder, Migraine, Nexplanon insertion (06/16/2021), Obesity, PONV (postoperative nausea and vomiting), Postpartum depression, Psoriasis, Psychiatric Hospitalizations, PTSD (post-traumatic stress disorder), Restless leg syndrome, Rh negative state in antepartum period (HHS-HCC) (05/31/2020), Seizures    (CMS-HCC) (05/13/2023), Self-injurious behavior, Sleep apnea, obstructive, Superimposed preeclampsia with severe features (05/16/2013), Supervision of high risk pregnancy in third trimester (HHS-HCC) (03/24/2021), Trauma, Urinary tract infection, and Varicella.  - She has Episode of recurrent major depressive disorder; Obstructive sleep apnea; BMI >60; Psoriasis; Severe persistent asthma; Hypothyroidism affecting pregnancy in third trimester (HHS-HCC); Idiopathic intracranial hypertension; Previous CLASSICAL cesarean delivery; Family history of autism; Chronic hypertension; Positive GBS test; Anxiety; Bilateral carpal tunnel syndrome; Calculus of gallbladder with chronic 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; Chronic headaches; Cognitive impairment; Eosinophilic esophagitis; Fibrocystic breast changes; H/O urticaria; Continuous auditory hallucinations; Hidradenitis suppurativa; History of manic depressive disorder; History of MRSA infection; Iron  deficiency anemia; Lithium toxicity;  Metabolic syndrome; Lymphedema of both lower extremities; Neuropathy; Retention cyst of nasal sinus; Seasonal allergies; Suicidal ideation; Encounter for procreative genetic counseling; and Supervision of high risk pregnancy in first trimester (HHS-HCC) on their problem list.  Seizure activity after last pregnancy     Surgical Hx:  - She  has a past surgical history that includes Tonsillectomy and adenoidectomy; Nasal septum surgery; Wisdom tooth extraction; pr decompress optic nerve (Bilateral, 08/04/2020); pr cesarean delivery only (N/A, 08/11/2020); pr cesarean delivery only (N/A, 09/01/2021); Cholecystectomy; Brain surgery (11/27/20); Anesthesia Arterial Line (05/12/2023); pr cesarean delivery only (N/A, 05/12/2023); Other surgical history; Cesarean section (08/11/20- 09/01/21-05/12/23); and Eye surgery (2022).    Perinatal Hx:  -     Medication List:  - She has a current medication list which includes the following prescription(s): acetaminophen , aspirin , blood pressure monitor, bupropion , bupropion , butalbital -acetaminophen -caff, cimzia , cyclobenzaprine , derma-smoothe /fs scalp oil, docusate sodium , empty container, furosemide , labetalol , levothyroxine , magnesium  oxide, montelukast , prenatal vitamin plus low iron , pantoprazole , sertraline , and solifenacin.    Allergies:  - She is allergic to adhesive, adhesive tape-silicones, etanercept, latex, mold, other, tramadol, wound dressings, ziprasidone, ziprasidone hcl, and ziprasidone mesylate.    Social Hx:  - She  reports that she has never smoked. She has never used smokeless tobacco. She reports that she does not currently use alcohol. She reports that she does not use drugs.    Family Hx/Pedigree/Heredogram:  - She family history includes Alcohol abuse in her father; Aneurysm in her maternal grandmother; Arthritis in her mother; Bipolar disorder in her father; Cancer in an other family member; Clotting disorder in her maternal grandmother; Depression in her father, maternal grandmother, and mother; Diabetes in her father, maternal grandfather, maternal grandmother, paternal grandfather, and paternal grandmother; Drug abuse in her father; Heart disease in her maternal grandfather, maternal grandmother, and mother; Hypertension in her father, maternal grandfather, maternal grandmother, mother, paternal grandfather, and paternal grandmother; Mental illness in her father, maternal grandmother, mother, and another family member; Miscarriages / [place] in her mother; Multiple sclerosis in her mother; Retinal detachment in her mother.    Height and Weight:  Height: 5'3  Weight: (Each visit for papilledema, IIH, Congenital Optic Nerve Anomaly, AION)  - / / : 389 lbs    Recent Relevant Labs:  -  Lab Results       Component                Value               Date                       A1C                      4.7 (L)             11/08/2023           Component  Ref Range & Units (hover) 11/08/23 1215 07/22/23 1448 05/14/23 0401 05/13/23 0339 05/12/23 2131 05/12/23 1217 05/12/23 0348    WBC 9.9 11.1 13.1 High  22.6 High  25.9 High  14.2 High  12.3 High    RBC 4.42 4.84 3.95 4.09 4.44 4.87 4.41   HGB 11.8 12.7 11.0 Low  11.5 12.4 13.6 12.6   HCT 35.2 38.5 33.7 Low  34.5 37.8 40.7 37.0   MCV 79.6 79.5 85.3 84.3 85.1 83.6 83.7   MCH 26.8 26.3 27.9 28.1 28.0 28.0 28.5   MCHC 33.6 33.1  32.7 33.3 32.9 33.5 34.1   RDW 15.4 High  14.7 14.2 14.2 14.4 14.1 14.3   MPV 7.5 7.5 7.0 7.1 7.1 7.0 6.9   Platelet 222 234 205 235 225 239 193  Resulting [facility]    General Imaging:  MRI (05/01/23): IMPRESSION: No acute intracranial abnormality. Unchanged position of right-sided ventricular shunt catheter; unchanged ventricular caliber when compared with prior examination performed on 02/13/2023.    EXAM TODAY    - BCVA: Corrected distance visual acuity was 20/250 in the right eye and 20/200 in the left eye.  Base Eye Exam       Visual Acuity (Snellen - Linear)         Right Left    Dist cc 20/250 20/200    Dist ph cc NI NI      Correction: Glasses              Tonometry ([facility], 9:58 AM)         Right Left    Pressure 16 17              Pupils         Dark Light Shape React APD    Right 5 4 Round Brisk None    Left 5 4 Round Brisk None              Visual Fields         Left Right    Restrictions Partial outer superior temporal, inferior temporal, superior nasal, inferior nasal deficiencies Partial outer superior temporal, inferior temporal, superior nasal, inferior nasal deficiencies              Extraocular Movement         Right Left     Full Full              Neuro/Psych       Oriented x3: Yes    Mood/Affect: Normal                  Additional Tests       Color         Right Left    [place]sh[place]ara 4/12 6/12              Stereo       Fly: -    Animals: 0/3    Circles: 2/9              Worth 4 Dot       Distance: UTO    Near: 5 green                  Slit Lamp and Fundus Exam       External Exam         Right Left    External Normal Normal              Slit Lamp Exam         Right Left    Lids/Lashes Normal Normal    Conjunctiva/Sclera White and quiet White and quiet    Cornea Clear Clear    Anterior Chamber Deep and quiet Deep and quiet    Iris Round and reactive Round and reactive    Lens Clear Clear    Anterior Vitreous Normal Normal              Fundus Exam  Right Left    Disc post edematous changes post edematous changes    C/D Ratio 0.2 0.2    Macula Normal Normal                  Refraction       Wearing Rx         Sphere Cylinder Axis    Right -6.75 +4.50 090    Left -6.25 +4.00 075      Type: Single Vision              Manifest Refraction (Auto)         Sphere Cylinder Axis Dist VA Add    Right -6.75 +3.50 083      Left -9.00 +6.00 075                Manifest Refraction #2         Sphere Cylinder Axis Dist VA Add    Right -6.75 +4.50 083 20/250 20/400    Left -6.25 +4.00 075 20/200 20/400                    ANCILLARY TESTING  - Humphrey Visual Field (HVF) size V: right eye: High FN; generalized depression  left eye: high FN, generalized depression  - OCT Retinal Nerve Fiber Layer (RNFL): 107//84; normal, stable from 2024  - OCT macular Ganglion Cell Complex (GCC): 92//88 normal OU    ASSESSMENT    # IIH  - normal rnfl/gcl on exam today  - normal neuro ophthalmological exam today  - pressure headaches every other day, start at night.   - intensity 10, last a couple of hours up to a couple of days  - No explanation for low vision on ocular exam today.    # Blurry vision both eyes; onset 2022  - slow and progressive  - normal neuro ophthalmolgical exam  - VA worse with refraction   - Worse for near than for distance 20/800 vs 20/200    # Monocular diplopia OU    PLAN    # Brain MRI wo     # Return in about 4 weeks (around 12/15/2023) for HVF size 5 OU; MRI review.    Patient has been advised regarding signs and symptoms  suggestive of worsening or new problem, to report to the [facility]   Emergency Room immediately.    Advised if there are further questions they can contact us  via [email or web address] or  calling our clinical office number [phone] [place], Environmental health practitioner).          Lonni KANDICE Burns, MD            Dear None Per Patient Pcp   I greatly appreciate the referral.    Please do not hesitate to contact me if you have any questions or concerns or additional information regarding our mutual patient.      Best regards,  Lonni Burns, MD   Neuro-ophthalmologist        PREPARING FOR YOUR NEXT NEURO-OPHTHALMOLOGY EVALUATION  Please be advised that Neuro-Ophthalmology evaluation is a highly specialized and thorough  process. The time requirement for visits ranges from 3-5 hours. This includes registration,  technician evaluation, pupils dilation, special testing, attending/fellow chart review and  clinical encounter with attending provider.  1. Request that your treating physicians send all relevant information to the neuro-  ophthalmologist prior to your appointment, including office notes, results of laboratory  tests and reports of  CT and MRI scans.  2. If you have had a CT or MRI scan performed, arrange to pick up the actual films and  bring them with you, or have the facility mail them to the neuro-ophthalmologist in  advance or your appointment.  3. You will probably have your pupils dilated during the visit. The eye drops last about 4  hours and will make things look bright and blurry up close. Have someone else drive you  to the appointment and bring your sunglasses.  4. Ladies, in order for the physician to get a good look at your eyelids and to avoid ruining  your appearance when the eye drops are administered, do not wear eye makeup.  5. Bring a complete list of medications with you, including the name and dosage of  prescription and over-the-counter medications.    WHAT WILL HAPPEN DURING YOUR NEXT EVALUATION?  1. The neuro-ophthalmologic evaluation is one of the most comprehensive examinations  you will experience. It may take a few hours to complete. You will be asked to give an  account of your current problem and relate your entire medical history, including  previous hospitalizations, operations, serious illnesses, medical problems in your family  members, and medication allergies.   2. You will have a complete eye examination. This may include testing of your peripheral  vision (visual field test).  3. You may have a partial or complete neurologic exam to test your strength, sensation,  and coordination.   4. The neuro-ophthalmologist will review the records and scans from previous evaluations,  if applicable.   5. After the examination, the neuro-ophthalmolgist will discuss the diagnosis (or possible  diagnoses), the need for any additional testing and possible treatment.

## 2023-11-22 MED ORDER — ACETAZOLAMIDE ER 500 MG CAPSULE,EXTENDED RELEASE
ORAL_CAPSULE | Freq: Three times a day (TID) | ORAL | 6 refills | 20.00000 days
Start: 2023-11-22 — End: ?

## 2023-11-28 DIAGNOSIS — L409 Psoriasis, unspecified: Principal | ICD-10-CM

## 2023-11-28 MED ORDER — CIMZIA 400 MG/2 ML (200 MG/ML X 2) SUBCUTANEOUS SYRINGE KIT
SUBCUTANEOUS | 1 refills | 28.00000 days
Start: 2023-11-28 — End: ?

## 2023-11-29 ENCOUNTER — Encounter: Admit: 2023-11-29 | Discharge: 2023-11-29 | Payer: Medicaid (Managed Care)

## 2023-11-29 DIAGNOSIS — O0991 Supervision of high risk pregnancy, unspecified, first trimester: Principal | ICD-10-CM

## 2023-11-29 DIAGNOSIS — L409 Psoriasis, unspecified: Principal | ICD-10-CM

## 2023-11-29 DIAGNOSIS — G4733 Obstructive sleep apnea (adult) (pediatric): Principal | ICD-10-CM

## 2023-11-29 DIAGNOSIS — H543 Unqualified visual loss, both eyes: Principal | ICD-10-CM

## 2023-11-29 DIAGNOSIS — I10 Essential (primary) hypertension: Principal | ICD-10-CM

## 2023-11-29 DIAGNOSIS — J45909 Unspecified asthma, uncomplicated: Principal | ICD-10-CM

## 2023-11-29 DIAGNOSIS — O99210 Obesity complicating pregnancy, unspecified trimester: Principal | ICD-10-CM

## 2023-11-29 DIAGNOSIS — G932 Benign intracranial hypertension: Principal | ICD-10-CM

## 2023-11-29 DIAGNOSIS — O99513 Diseases of the respiratory system complicating pregnancy, third trimester: Principal | ICD-10-CM

## 2023-11-29 LAB — URINALYSIS WITH MICROSCOPY
BILIRUBIN UA: NEGATIVE
BLOOD UA: NEGATIVE
GLUCOSE UA: NEGATIVE
KETONES UA: NEGATIVE
NITRITE UA: NEGATIVE
PH UA: 7.5 (ref 5.0–9.0)
PROTEIN UA: 30 — AB
RBC UA: 2 /HPF (ref <=4)
SPECIFIC GRAVITY UA: 1.024 (ref 1.003–1.030)
SQUAMOUS EPITHELIAL: 7 /HPF — ABNORMAL HIGH (ref 0–5)
UROBILINOGEN UA: 2 — AB
WBC UA: <1 /HPF (ref 0–5)

## 2023-11-29 LAB — COMPREHENSIVE METABOLIC PANEL
ALBUMIN: 3.7 g/dL (ref 3.4–5.0)
ALKALINE PHOSPHATASE: 133 U/L — ABNORMAL HIGH (ref 46–116)
ALT (SGPT): 55 U/L — ABNORMAL HIGH (ref 10–49)
ANION GAP: 11 mmol/L (ref 5–14)
AST (SGOT): 24 U/L (ref <=34)
BILIRUBIN TOTAL: 0.4 mg/dL (ref 0.3–1.2)
BLOOD UREA NITROGEN: 11 mg/dL (ref 9–23)
BUN / CREAT RATIO: 20
CALCIUM: 9.1 mg/dL (ref 8.7–10.4)
CHLORIDE: 108 mmol/L — ABNORMAL HIGH (ref 98–107)
CO2: 20 mmol/L (ref 20.0–31.0)
CREATININE: 0.56 mg/dL (ref 0.55–1.02)
EGFR CKD-EPI (2021) FEMALE: >90 mL/min/1.73m2 (ref >=60)
GLUCOSE RANDOM: 93 mg/dL (ref 70–179)
POTASSIUM: 3.4 mmol/L (ref 3.4–4.8)
PROTEIN TOTAL: 7.7 g/dL (ref 5.7–8.2)
SODIUM: 139 mmol/L (ref 135–145)

## 2023-11-29 LAB — CBC
HEMATOCRIT: 36.7 % (ref 34.0–44.0)
HEMOGLOBIN: 12.4 g/dL (ref 11.3–14.9)
MEAN CORPUSCULAR HEMOGLOBIN CONC: 33.9 g/dL (ref 32.0–36.0)
MEAN CORPUSCULAR HEMOGLOBIN: 27.2 pg (ref 25.9–32.4)
MEAN CORPUSCULAR VOLUME: 80.2 fL (ref 77.6–95.7)
MEAN PLATELET VOLUME: 7.8 fL (ref 6.8–10.7)
PLATELET COUNT: 223 10*9/L (ref 150–450)
RED BLOOD CELL COUNT: 4.58 10*12/L (ref 3.95–5.13)
RED CELL DISTRIBUTION WIDTH: 15.3 % — ABNORMAL HIGH (ref 12.2–15.2)
WBC ADJUSTED: 10.4 10*9/L (ref 3.6–11.2)

## 2023-11-29 LAB — PRO-BNP: PRO-BNP: 52 pg/mL (ref <=300.0)

## 2023-11-29 MED ORDER — BUDESONIDE-FORMOTEROL HFA 80 MCG-4.5 MCG/ACTUATION AEROSOL INHALER
Freq: Two times a day (BID) | RESPIRATORY_TRACT | 3 refills | 31.00000 days | Status: CP
Start: 2023-11-29 — End: 2024-11-28

## 2023-11-29 MED ORDER — PROMETHAZINE 25 MG TABLET
ORAL_TABLET | Freq: Four times a day (QID) | ORAL | 1 refills | 8.00000 days | Status: CP | PRN
Start: 2023-11-29 — End: ?

## 2023-11-29 MED ORDER — BUTALBITAL-ACETAMINOPHEN-CAFFEINE 50 MG-300 MG-40 MG CAPSULE
ORAL_CAPSULE | ORAL | 0 refills | 4.00000 days | Status: CP | PRN
Start: 2023-11-29 — End: ?

## 2023-11-29 NOTE — Unmapped (Addendum)
 PNL wnl   Pap last done 2022, will need pap postpartum   NIPT LR   +FHT today by doppler  Will offer flu at next visit

## 2023-11-29 NOTE — Unmapped (Addendum)
 Had sleep apnea test repeated > CPAP was no longer needed  Will repeat sleep study test in pregnancy given worsening SOB and weight gain since prior pregnancy

## 2023-11-29 NOTE — Unmapped (Addendum)
 Currently on Labetalol  600mg  BID + hydralazine  50mg  q6 hours  Not currently taking BP at home  BP today normotensive  Follows with Dr. MARLA at [facility]  Baseline HELLP labs wnl

## 2023-11-29 NOTE — Unmapped (Signed)
 [place] [facility]                                                                         Return Prenatal Note    Assessment/Plan:   Plan   28 y.o. [phone] at [redacted]w[redacted]d presents for a follow-up OB visit for high risk pregnancy.     Assessment & Plan  Supervision of high risk pregnancy in first trimester (HHS-HCC)  PNL wnl   Pap last done 2022, will need pap postpartum   NIPT LR   +FHT today by doppler  Will offer flu at next visit  Idiopathic intracranial hypertension  Has hx of IIH with venous stent placement of right TS segment in 2022. MRI brain has been scheduled for 11/26  Following with neurosurgeron Dr. Kassie. Rescheduled follow up appointment for today to 12/17  Only taking Diamox  once daily instead of TID due to concern for dehydration in setting of frequent urination and limited PO intake 2/2 nausea.  Will plan for CMP and UA to evaluate for dehydration today. She was encouraged to increase her diamox  to at least BID given eye symptoms and concern for exacerbation of her IIH.   Obstructive sleep apnea  Had sleep apnea test repeated > CPAP was no longer needed  Will repeat sleep study test in pregnancy given worsening SOB and weight gain since prior pregnancy  BMI >60  1st tri Hgb A1c 4.7  Plan for serial growth USN in 3rd trimester  Psoriasis  Is still taking cimzia   Severe persistent asthma  Worsening SOB in the past week. Concerned that asthma may be contributing.   Currently taking singulair  daily and using nebulizer treatment a couple times a week.  Symbicort added today  Chronic hypertension  Currently on Labetalol  600mg  BID + hydralazine  50mg  q6 hours  Not currently taking BP at home  BP today normotensive  Follow[facility]  Baseline HELLP labs wnl  Visual loss, both eyes  Seen by Neuro-Ophtho on 10/9. Eye exam was normal and given worsening of her blurry vision, she was ordered for an MRI brain as above.   Recommended she take her Diamox  at least twice daily if unable to tolerate it TID   Suicidal ideation  Reports worsening mood due to increased anxiety, fatigue, and tearfulness.  Has history of SI in the past, denies SI or HI at this time  Currently following with a therapist weekly. Given frequency of patient in [place] for personal and child health visits, recommended she seek a mental health provider in the [place] area.   No indication for inpatient psych admission at this time.   Mood check q visit   Previous CLASSICAL cesarean delivery  Hx of 3 prior CS with 1 prior classical CS  Will discuss timing of delivery pending clinical status in the pregnancy  Shortness of breath  Worsening SOB in the past week, both at rest and with exertion  CV/lung exam reassuring but discussed concern for possible cardiac etiology. Pro-BNP and maternal echo ordered (last echo 04/2023)  Low concern for VTE based on no calf TTP/redness/asymmetric swelling  Will continue to monitor.   Nausea  Persistent nausea refractory to zofran. Prefers phenergan ,  rx sent to pharmacy today.      Future Appointments   Date Time Provider Department Center   12/06/2023 10:40 AM Dude, Jenkins Setter, MD [facility]   12/13/2023  1:00 PM Dude, Jenkins Setter, MD [facility]   12/15/2023 10:45 AM Berneda Bruckner, MD [facility]   12/20/2023  3:20 PM Jeanetta Edsel Lunger [facility]   12/29/2023  9:20 AM Dude, Jenkins Setter, MD [facility]   01/03/2024 11:00 AM Dominique Jenkins Setter, MD [facility]   01/04/2024  4:30 PM IC MRI MBL 1 [facility] [place] - [facility]   01/19/2024 10:00 AM [facility] OB US  RM 1 IMG[facility]OB[facility]IL [place] - MFM at   01/19/2024 11:20 AM Melson, Ber[place] LABOR, MD [facility]   01/20/2024 11:00 AM Lattimore, Sharlet Dubonnet, FNP [facility]   01/25/2024 11:00 AM Kassie Dallas Luke Alberteen, MD [facility]            Subjective:         28 y.o. [id number] at 107w5d presents for follow-up OB visit for high risk pregnancy.     Se reports feeling more tired than her normal. She notes her nausea is worsening and she is unable to finish her meals because she gets really sick. She feels like her zofran is not helping. She has felt that phenergan  has helped in the past and would like a prescription. She has not had any vomiting. She states she doesn't feel right.     She states she feels out of breath at rest and with walking. She notes feeling SOB in prior pregnancies similarly but it is worst with this one.     She also notes brain fog and forgetfulness. She feels more moody, anxious, tired, and can't express her emotions and that leads to tears. She is seeing a therapist weekly. This has not been helping. She denies SI or HI.     Denies vaginal bleeding or cramping or abnormal discharge.     She has started bASA 162mg . She has not been checking her BP at home but has not missed any doses of her Labetalol  or hydralazine .    She states she was supposed to be taking her Diamox  TID but is only taking it once daily due to her feeling like it makes her more dehydrated since she is not taking in much PO related to her nausea and has increased urination.       Objective:        The following portions of the patient's history were reviewed and updated as appropriate: allergies, current medications, past obstetric history, past family history, past medical history, past social history, past surgical history, and problem list    Flowsheet Vitals:  Pulse: 85  BP: 127/75  Movement: Absent  Loss of Fluid: No  Bleeding: No  Fetal Heart Rate: 178  Total weight gain: 9.888 kg (21 lb 12.8 oz)    General Appearance No acute distress, well appearing, and well nourished.   Pulmonary Normal work of breathing. Breath sounds distant but clear with normal air movement.    Cardiovascular Normal rate and rhythm   Gastronintestinal Abdomen soft, non tender, no rebound or guarding.   Genitourinary SVE deferred     Extremities  Musculoskeletal No asymmetric swelling, no calf TTP, no BLE erythema or warmth  Normal gait, grossly normal range of motion   Neurologic Alert and oriented to person, place, and time   Psychiatric  Integumentary  Mood and affect within normal limits  Skin is warm, dry, no rash noted   This encounter was coded based on components:   Level of Service Calculator Selections       Number and Complexity of Problems Addressed                       2 or more stable chronic illnesses                   1 acute, uncomplicated illness or injury                   1 or more chronic illness with severe exacerbation, progression, or side effects of treatment               Amount and/or Complexity of Data to be Reviewed and Analyzed                       3+ unique tests ordered               Risk of Complications and/or Morbidity or Mortality of Patient Management                       Moderate                   Please see the note for further information on patient assessment and treatment.                   The patient was seen and discussed with Dr. Dominique.     Arvin Saltness (loo-wee), MD, MPH   Clinical Instructor & Fellow  Department of Obstetrics and Gynecology  Division of Maternal-Fetal Medicine

## 2023-11-29 NOTE — Unmapped (Signed)
 Is still taking cimzia 

## 2023-11-29 NOTE — Unmapped (Addendum)
 Worsening SOB in the past week. Concerned that asthma may be contributing.   Currently taking singulair  daily and using nebulizer treatment a couple times a week.  Symbicort added today

## 2023-11-29 NOTE — Unmapped (Addendum)
 1st tri Hgb A1c 4.7  Plan for serial growth USN in 3rd trimester

## 2023-11-29 NOTE — Unmapped (Addendum)
 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 MRI brain has been scheduled for 11/26  Following with neurosurgeron Dr. Kassie. Rescheduled follow up appointment for today to 12/17  Only taking Diamox  once daily instead of TID due to concern for dehydration in setting of frequent urination and limited PO intake 2/2 nausea.  Will plan for CMP and UA to evaluate for dehydration today. She was encouraged to increase her diamox  to at least BID given eye symptoms and concern for exacerbation of her IIH.

## 2023-11-29 NOTE — Unmapped (Signed)
 [facility] Refill Coordination Note    Tara Christensen, DOB: 1995/08/18  Phone: There are no phone numbers on file.      All above HIPAA information was verified with patient.         11/28/2023     2:11 PM   Specialty Rx Medication Refill Questionnaire   Which Medications would you like refilled and shipped? Cizima   Please list all current allergies: Ultram and adhesive of the tape   Have you missed any doses in the last 30 days? No   Have you had any changes to your medication(s) since your last refill? No   How much of each medication do you have remaining at home? (eg. number of tablets, injections, etc.) 0   If receiving an injectable medication, next injection date is 12/05/2023   Have you experienced any side effects in the last 30 days? No   Please enter the full address (street address, city, state, zip code) where you would like your medication(s) to be delivered to. [address], [place], [place] [address]   Please specify on which day you would like your medication(s) to arrive. Note: if you need your medication(s) within 3 days, please call the pharmacy to schedule your order at [phone]  12/01/2023   Has your insurance changed since your last refill? No   Would you like a pharmacist to call you to discuss your medication(s)? No   Do you require a signature for your package? (Note: if we are billing Medicare Part B or your order contains a controlled substance, we will require a signature) No   I have been provided my out of pocket cost for my medication and approve the pharmacy to charge the amount to my credit card on file. Yes         Completed refill call assessment today to schedule patient's medication shipment from the [facility] [phone]).  All relevant notes have been reviewed.       Confirmed patient received a Conservation officer, historic buildings and a Surveyor, mining with first shipment. The patient will receive a drug information handout for each medication shipped and additional FDA Medication Guides as required.         REFERRAL TO PHARMACIST     Referral to the pharmacist: Not needed      [facility]     Shipping address confirmed in Epic.     Delivery Scheduled: Yes, Expected medication delivery date: 12/01/23.  However, Rx request for refills was sent to the provider as there are none remaining.     Medication will be delivered via UPS to the temporary address in Epic WAM.    [name]   [facility] Specialty Technician

## 2023-11-29 NOTE — Unmapped (Addendum)
 Seen by Neuro-Ophtho on 10/9. Eye exam was normal and given worsening of her blurry vision, she was ordered for an MRI brain as above.   Recommended she take her Diamox  at least twice daily if unable to tolerate it TID

## 2023-11-30 MED ORDER — CIMZIA 400 MG/2 ML (200 MG/ML X 2) SUBCUTANEOUS SYRINGE KIT
SUBCUTANEOUS | 11 refills | 28.00000 days | Status: CP
Start: 2023-11-30 — End: ?
  Filled 2023-11-30: qty 2, 28d supply, fill #0

## 2023-11-30 NOTE — Unmapped (Signed)
 Hx of 3 prior CS with 1 prior classical CS  Will discuss timing of delivery pending clinical status in the pregnancy

## 2023-11-30 NOTE — Unmapped (Addendum)
 Persistent nausea refractory to zofran. Prefers phenergan , rx sent to pharmacy today.

## 2023-11-30 NOTE — Unmapped (Addendum)
 Reports worsening mood due to increased anxiety, fatigue, and tearfulness.  Has history of SI in the past, denies SI or HI at this time  Currently following with a therapist weekly. Given frequency of patient in [place] for personal and child health visits, recommended she seek a mental health provider in the [place] area.   No indication for inpatient psych admission at this time.   Mood check q visit

## 2023-11-30 NOTE — Unmapped (Signed)
 Worsening SOB in the past week, both at rest and with exertion  CV/lung exam reassuring but discussed concern for possible cardiac etiology. Pro-BNP and maternal echo ordered (last echo 04/2023)  Low concern for VTE based on no calf TTP/redness/asymmetric swelling  Will continue to monitor.

## 2023-11-30 NOTE — Unmapped (Signed)
 Persistent nausea with limited PO intake, refractory to Zofran  Pt prefers Phenergan . Ordered today

## 2023-12-01 NOTE — Unmapped (Signed)
 Tara Christensen is a 28 y.o. [phone] female at [redacted]w[redacted]d calling regarding concerns for headache.     The patient reports that she has been experiencing a constant headache that is described as an intense head pressure with associated nausea and numbness of her bilateral arms and hands. 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 She has not had relief from her rescue headache medication.     She has been taking her Diamox  twice daily instead of three times daily due to concerns for dehydration. She has planned Brain MRI in November.     Advised patient to reach out to Neurosurgery as well . If persistent headache, recommended ED evaluation with possible Neuro consult.

## 2023-12-01 NOTE — Unmapped (Signed)
 Patient will call back later to schedule an appointment    Thanks  Charmaine

## 2023-12-06 ENCOUNTER — Inpatient Hospital Stay: Admit: 2023-12-06 | Discharge: 2023-12-07 | Payer: Medicaid (Managed Care)

## 2023-12-06 DIAGNOSIS — G4733 Obstructive sleep apnea (adult) (pediatric): Principal | ICD-10-CM

## 2023-12-06 DIAGNOSIS — O99513 Diseases of the respiratory system complicating pregnancy, third trimester: Principal | ICD-10-CM

## 2023-12-06 DIAGNOSIS — G932 Benign intracranial hypertension: Principal | ICD-10-CM

## 2023-12-06 DIAGNOSIS — O0991 Supervision of high risk pregnancy, unspecified, first trimester: Principal | ICD-10-CM

## 2023-12-06 DIAGNOSIS — J45909 Unspecified asthma, uncomplicated: Principal | ICD-10-CM

## 2023-12-06 DIAGNOSIS — I10 Essential (primary) hypertension: Principal | ICD-10-CM

## 2023-12-06 NOTE — Progress Notes (Signed)
 [facility]                                                                         Return Prenatal Note    Assessment/Plan:   Plan   28 y.o. [phone] at [redacted]w[redacted]d presents for a follow-up OB visit for high risk pregnancy.     Assessment & Plan  Supervision of high risk pregnancy in first trimester (HHS-HCC)  - prenatal care UTD    Chronic hypertension  - BP @ goal on current regimen  Obstructive sleep apnea  - awaiting sleep study  Severe persistent asthma  - no recent sx    Idiopathic intracranial hypertension  - states her HA has improved since Friday (when she called pager); taking diamox  twice per day instead of three times as she is concerned about dehydration. I noted if she has a HA this would be the first step (to increase diamox  to TID as prescribed) then present to ED for MRI          Future Appointments   Date Time Provider Department Center   12/13/2023  1:00 PM Yatzari Jonsson, Jenkins Setter, MD [facility]   12/20/2023  3:20 PM Jeanetta Edsel Lunger [facility]   12/29/2023  9:20 AM Jud Fanguy, Jenkins Setter, MD [facility]   01/03/2024 11:00 AM Dominique Jenkins Setter, MD [facility]   01/04/2024  4:30 PM IC MRI MBL 1 [facility] [place] [facility]   01/19/2024 10:00 AM [facility] US  RM 1 IMGUSOBVIL [place] - MFM at   01/19/2024 11:20 AM Melson, Berwyn LABOR, MD [facility]   01/20/2024 11:00 AM Lattimore, Sharlet Dubonnet, FNP [facility]   01/25/2024 11:00 AM Kassie Dallas Luke Alberteen, MD [facility]   02/15/2024  9:30 AM Berneda Bruckner, MD [facility]   04/17/2024  6:30 PM SLEEP 10 [id number]            Subjective:         28 y.o. [id number] at [redacted]w[redacted]d presents for follow-up OB visit for high risk pregnancy. Patient reports no fetal movement and denies contractions, leakage of fluid, or vaginal bleeding. Having difficulty with FOB; petitioning for sole custody of children and living with parents      Objective: The following portions of the patient's history were reviewed and updated as appropriate: allergies, current medications, past obstetric history, past family history, past medical history, past social history, past surgical history, and problem list    Flowsheet Vitals:  Temp: 37.3 ??C (99.1 ??F)  Pulse: 78  BP: 111/75  Movement: Present (every once in a while per patient)  Loss of Fluid: No  Bleeding: No  Contractions: Not present  Fetal Heart Rate: US   Total weight gain: 10.7 kg (23 lb 8 oz)    General Appearance No acute distress, well appearing, and well nourished.   Pulmonary Normal work of breathing.   Cardiovascular Normal rate.  Edema none.      Gastronintestinal Abdomen soft, gravid, no rebound or guarding.   Genitourinary SVE deferred     Muskuloskeletal Normal gait, grossly normal range of motion   Neurologic Alert and oriented to person, place, and time  Psychiatric  Integumentary Mood and affect within normal limits  Skin is warm, dry, no rash noted   This encounter was coded based on time. I personally spent 20 minutes face-to-face and non-face-to-face in the care of this patient, which includes all pre, intra, and post visit time on the date of service.     Zelda Gaskins, MD

## 2023-12-06 NOTE — Assessment & Plan Note (Signed)
-   no recent sx

## 2023-12-06 NOTE — Assessment & Plan Note (Signed)
-   prenatal care UTD

## 2023-12-06 NOTE — Assessment & Plan Note (Signed)
[ ]   awaiting sleep study

## 2023-12-06 NOTE — Assessment & Plan Note (Signed)
-   states her HA has improved since Friday (when she called pager); taking diamox  twice per day instead of three times as she is concerned about dehydration. I noted if she has a HA this would be the first step (to increase diamox  to TID as prescribed) then present to ED for MRI

## 2023-12-06 NOTE — Assessment & Plan Note (Signed)
-   BP @ goal on current regimen

## 2023-12-14 ENCOUNTER — Ambulatory Visit: Admit: 2023-12-14 | Discharge: 2023-12-15 | Payer: Medicaid (Managed Care)

## 2023-12-14 DIAGNOSIS — Z818 Family history of other mental and behavioral disorders: Principal | ICD-10-CM

## 2023-12-16 MED ORDER — BUTALBITAL-ACETAMINOPHEN-CAFFEINE 50 MG-300 MG-40 MG CAPSULE
ORAL_CAPSULE | ORAL | 0 refills | 4.00000 days | Status: CN | PRN
Start: 2023-12-16 — End: ?

## 2023-12-16 MED ORDER — BUTALBITAL-ACETAMINOPHEN-CAFFEINE 50 MG-325 MG-40 MG TABLET
ORAL_TABLET | ORAL | 1 refills | 3.00000 days | Status: CN | PRN
Start: 2023-12-16 — End: ?

## 2023-12-16 MED ORDER — CYCLOBENZAPRINE 10 MG TABLET
ORAL_TABLET | Freq: Every evening | ORAL | 3 refills | 30.00000 days | Status: CP
Start: 2023-12-16 — End: ?

## 2023-12-16 MED ORDER — ACETAZOLAMIDE ER 500 MG CAPSULE,EXTENDED RELEASE
ORAL_CAPSULE | Freq: Three times a day (TID) | ORAL | 4 refills | 90.00000 days | Status: CP
Start: 2023-12-16 — End: 2025-03-10

## 2023-12-16 NOTE — Telephone Encounter (Signed)
 Patient called and left VM requesting Diamox  and her headache medication refilled. Message routed to Dr. Dominique for refill.

## 2023-12-16 NOTE — Addendum Note (Signed)
 Addended by: DOMINIQUE CALDRON MELISSA on: 12/16/2023 05:44 PM     Modules accepted: Orders

## 2023-12-16 NOTE — Telephone Encounter (Signed)
 Returning patient's call. This RN left VM informing pt I received her call and have reached out to the provider for refills, should she need anything over the weekend she can reach out to the provider on call.

## 2023-12-16 NOTE — Telephone Encounter (Addendum)
 Encounter opened to refill prescriptions: diamox  1500 mg TID, fioricet , flexeril 

## 2023-12-19 NOTE — Telephone Encounter (Signed)
 Patient would like to be seen for rash on belly - was advised from OBGYN this should be something that needs to be followed up with PCP. PCP does not see OB patients. Will ask to see if another provider can see her.

## 2023-12-22 NOTE — Telephone Encounter (Signed)
 Patient called with complaints of headache, dizziness, lethargic, confusion, and pressure behind her eyes. Patient advised to head to her nearest emergency room to be evaluated. Patient demonstrates understanding.

## 2023-12-25 ENCOUNTER — Emergency Department
Admit: 2023-12-25 | Discharge: 2023-12-26 | Disposition: A | Discharge status: Home / Self Care | Payer: Medicaid (Managed Care) | Attending: Family Medicine

## 2023-12-25 DIAGNOSIS — E876 Hypokalemia: Principal | ICD-10-CM

## 2023-12-25 DIAGNOSIS — J189 Pneumonia, unspecified organism: Principal | ICD-10-CM

## 2023-12-25 LAB — COMPREHENSIVE METABOLIC PANEL
ALBUMIN: 4.1 g/dL (ref 3.4–5.0)
ALKALINE PHOSPHATASE: 129 U/L — ABNORMAL HIGH (ref 38–126)
ALT (SGPT): 25 U/L (ref <35)
ANION GAP: 7 mmol/L (ref 5–14)
AST (SGOT): 27 U/L (ref 14–38)
BILIRUBIN TOTAL: 0.4 mg/dL (ref 0.1–1.2)
BLOOD UREA NITROGEN: 7 mg/dL (ref 7–21)
BUN / CREAT RATIO: 14
CALCIUM: 8.6 mg/dL (ref 8.5–10.2)
CHLORIDE: 112 mmol/L — ABNORMAL HIGH (ref 98–107)
CO2: 18 mmol/L — ABNORMAL LOW (ref 22.0–32.0)
CREATININE: 0.5 mg/dL — ABNORMAL LOW (ref 0.60–1.00)
EGFR CKD-EPI (2021) FEMALE: >90 mL/min/1.73m2 (ref >=60)
GLUCOSE RANDOM: 89 mg/dL (ref 74–106)
POTASSIUM: 3.1 mmol/L — ABNORMAL LOW (ref 3.5–5.0)
PROTEIN TOTAL: 7.6 g/dL (ref 6.5–8.3)
SODIUM: 137 mmol/L (ref 135–145)

## 2023-12-25 LAB — CBC W/ AUTO DIFF
BASOPHILS ABSOLUTE COUNT: 0.1 10*9/L (ref 0.0–0.1)
BASOPHILS RELATIVE PERCENT: 0.6 %
EOSINOPHILS ABSOLUTE COUNT: 0.1 10*9/L (ref 0.0–0.5)
EOSINOPHILS RELATIVE PERCENT: 1.1 %
HEMATOCRIT: 36.2 % (ref 34.0–44.0)
HEMOGLOBIN: 12.4 g/dL (ref 11.3–14.9)
LYMPHOCYTES ABSOLUTE COUNT: 2.2 10*9/L (ref 1.1–3.6)
LYMPHOCYTES RELATIVE PERCENT: 22.2 %
MEAN CORPUSCULAR HEMOGLOBIN CONC: 34.3 g/dL (ref 32.0–36.0)
MEAN CORPUSCULAR HEMOGLOBIN: 27.6 pg (ref 25.9–32.4)
MEAN CORPUSCULAR VOLUME: 80.5 fL (ref 77.6–95.7)
MEAN PLATELET VOLUME: 6.7 fL — ABNORMAL LOW (ref 6.8–10.7)
MONOCYTES ABSOLUTE COUNT: 0.7 10*9/L (ref 0.3–0.8)
MONOCYTES RELATIVE PERCENT: 7.2 %
NEUTROPHILS ABSOLUTE COUNT: 6.8 10*9/L (ref 1.8–7.8)
NEUTROPHILS RELATIVE PERCENT: 68.9 %
NUCLEATED RED BLOOD CELLS: 0 /100{WBCs} (ref <=4)
PLATELET COUNT: 206 10*9/L (ref 150–450)
RED BLOOD CELL COUNT: 4.5 10*12/L (ref 3.95–5.13)
RED CELL DISTRIBUTION WIDTH: 15.3 % — ABNORMAL HIGH (ref 12.2–15.2)
WBC ADJUSTED: 9.9 10*9/L (ref 3.6–11.2)

## 2023-12-25 LAB — URINALYSIS WITH MICROSCOPY
BILIRUBIN UA: NEGATIVE
BLOOD UA: NEGATIVE
GLUCOSE UA: NEGATIVE
KETONES UA: NEGATIVE
LEUKOCYTE ESTERASE UA: NEGATIVE
NITRITE UA: NEGATIVE
PH UA: 6 (ref 5.0–7.0)
PROTEIN UA: NEGATIVE
RBC UA: 0 /HPF (ref 0–3)
SPECIFIC GRAVITY UA: 1.02 (ref 1.005–1.030)
SQUAMOUS EPITHELIAL: 5 /HPF (ref 0–5)
UROBILINOGEN UA: 0.2
WBC UA: 0 /HPF (ref 0–3)

## 2023-12-25 LAB — MAGNESIUM: MAGNESIUM: 2 mg/dL (ref 1.6–2.2)

## 2023-12-25 MED ORDER — AMOXICILLIN 500 MG CAPSULE
ORAL_CAPSULE | Freq: Three times a day (TID) | ORAL | 0 refills | 5.00000 days | Status: CP
Start: 2023-12-25 — End: 2023-12-30

## 2023-12-25 NOTE — ED Provider Notes (Signed)
 [facility]  Emergency Department Provider Note     ED Clinical Impression      Final diagnoses:   Community acquired pneumonia, unspecified laterality (Primary)   Hypokalemia          Impression and Progress Notes      Initial Impression, Differential Diagnosis and Plan of Care    Tara Christensen is a 28 y.o. female [phone] at [redacted]w[redacted]d with PMHx of HTN, bipolar 1 disorder, anemia, asthma, and seizures who presents to the ED for evaluation of gradually worsening cold-like symptoms, which began 3 weeks ago. Pt was hypertensive (169/105) upon triage, but vitals were otherwise WNL. DDx includes, but isn't not limited to: pneumonia, viral URI vs bronchitis. Plan for RPP, CBC, CMP, UA, magnesium . FHTs: 160s bpm. Will give NS bolus. CXR considered, but after shared decision making patient prefers to determine imaging at this time in setting of pregnancy.    Final Impression/MDM    Results reviewed with patient. She feels improved with BP improved to 120/51. Labs notable for K: 3.1, PO replacement given while in ED. Will treat empirically for CAP with amoxicillin  and azithromycin. Symptomatic care reviewed. PCP follow-up within one week for re-evaluation after today's visit and to monitor K. Return precautions reviewed.    Medication(s) Prescribed    Discharge Medication List as of 12/25/2023  9:39 PM        START taking these medications    Details   amoxicillin  (AMOXIL ) 500 MG capsule Take 1 capsule (500 mg total) by mouth Three (3) times a day for 5 days., Starting Sun 12/25/2023, Until Fri 12/30/2023, Normal      azithromycin (ZITHROMAX) 250 MG tablet Take 1 tablet (250 mg total) by mouth daily for 4 days., Starting Mon 12/26/2023, Until Fri 12/30/2023, Normal              Additional Progress Notes    ED Course as of 12/25/23 2313   Sun Dec 25, 2023   1817 BP: 169/105  Patient has h/o idiopathic intracranial hypertension, on diamox  1500mg  TID, labetalol  600mg  BID and hydralazine  50mg  TID.   1841 RPP negative 1841 Shared decision making reviewed with patient regarding XR. Risks and benefits reviewed. Patient prefers to defer imaging with plan to treat empirically for PNA.   1844 BP: 143/60   2020 CBC unremarkable    2059 Magnesium : 2.0   2147 FHT: 164 bpm       Portions of this record have been created using Scientist, clinical (histocompatibility and immunogenetics). Dictation errors have been sought, but may not have been identified and corrected.    See chart documentation for details.    ____________________________________________         History        Reason for Visit  Cough and Fever      HPI   Tara Christensen is a 28 y.o. female with PMHx of HTN, bipolar 1 disorder, anemia, asthma, and seizures who presents to the ED for evaluation of gradually worsening cold-like symptoms, which began 3 weeks ago. Pt endorses subjective fevers, hoarse voice, cough, chills, fatigue, nausea, chest pain, and difficulty breathing. She reports she can feel mucous stuck in her throat. Pt denies rhinorrhea, vomiting, diarrhea, abdominal pain, vaginal discharge/bleeding, back pain, and urinary problems. Pt is currently pregnant, [id number] at [redacted]w[redacted]d. She was advised by her OB to come to the ED for further evaluation. She has taken robitussin and Tylenol  for her symptoms. Her last dose of Tylenol  was 9 hours  ago.     Outside Historian(s)  None    External Records Reviewed  None      Past Medical History[1]    Problem List[2]    Past Surgical History[3]    No current facility-administered medications for this encounter.    Current Outpatient Medications:     acetaminophen  (TYLENOL ) 325 MG tablet, Take 2 tablets (650 mg total) by mouth every six (6) hours., Disp: 90 tablet, Rfl: 0    acetazolamide  (DIAMOX ) 500 mg ER 12 hr capsule, Take 3 capsules (1,500 mg total) by mouth three (3) times a day., Disp: 810 capsule, Rfl: 4    amoxicillin  (AMOXIL ) 500 MG capsule, Take 1 capsule (500 mg total) by mouth Three (3) times a day for 5 days., Disp: 15 capsule, Rfl: 0    aspirin  (ECOTRIN) 81 MG tablet, Take 2 tablets (162 mg total) by mouth daily., Disp: 30 tablet, Rfl: 9    [START ON 12/26/2023] azithromycin (ZITHROMAX) 250 MG tablet, Take 1 tablet (250 mg total) by mouth daily for 4 days., Disp: 4 tablet, Rfl: 0    blood pressure monitor Kit, 46cm arm circumference  [address]  [address]  [place] [address]    Medicaid #-[id number] (Patient not taking: Reported on 09/30/2023), Disp: 1 kit, Rfl: 0    budesonide -formoterol (SYMBICORT) 80-4.5 mcg/actuation inhaler, Inhale 2 puffs two (2) times a day., Disp: 10.2 g, Rfl: 3    buPROPion  (WELLBUTRIN  XL) 150 MG 24 hr tablet, Take 3 tablets (450 mg total) by mouth every morning., Disp: 90 tablet, Rfl: 4    buPROPion  (WELLBUTRIN  XL) 300 MG 24 hr tablet, Take 1 tablet (300 mg total) by mouth daily., Disp: , Rfl:     butalbital -acetaminophen -caff (FIORICET ) 50-300-40 mg cap, Take 1 capsule by mouth every four (4) hours as needed for headache. Take 1-2 tabs po as needed for headache, Disp: 20 capsule, Rfl: 0    CIMZIA  400 mg/2 mL (200 mg/mL x 2) SyKt injection syringe, Inject the contents of 2 syringes (400 mg total) under the skin every fourteen (14) days., Disp: 2 each, Rfl: 11    cyclobenzaprine  (FLEXERIL ) 10 MG tablet, Take 1 tablet (10 mg total) by mouth nightly. TAKE 1 TABLET (10 MG TOTAL) BY MOUTH NIGHTLY., Disp: 30 tablet, Rfl: 3    DERMA-SMOOTHE /FS SCALP OIL 0.01 % external oil, APPLY TO SCALP THREE nights PER WEEK AS NEEDED (Patient taking differently: daily as needed.), Disp: , Rfl:     docusate sodium  (COLACE) 100 MG capsule, Take 1 capsule (100 mg total) by mouth two (2) times a day., Disp: 60 capsule, Rfl: 11    empty container Misc, Use as directed to dispose of Cimzia  syringes., Disp: 1 each, Rfl: 2    furosemide  (LASIX ) 20 MG tablet, Take 1 tablet (20 mg total) by mouth daily for 2 days. (Patient not taking: Reported on 12/06/2023), Disp: 2 tablet, Rfl: 0    labetalol  (NORMODYNE ) 200 MG tablet, Take 3 tablets (600 mg total) by mouth two (2) times a day., Disp: 180 tablet, Rfl: 11    levothyroxine  (SYNTHROID ) 75 MCG tablet, Take 1 tablet (75 mcg total) by mouth daily., Disp: 90 tablet, Rfl: 2    magnesium  oxide (MAG-OX) 400 mg (241.3 mg elemental magnesium ) tablet, Take 1 tablet by mouth daily., Disp: , Rfl:     montelukast  (SINGULAIR ) 10 mg tablet, Take 1 tablet (10 mg total) by mouth nightly., Disp: 30 tablet, Rfl: 0    multivitamin, prenatal, folic  acid-iron , (PRENATAL VITAMIN PLUS LOW IRON ) 27-1 mg Tab, Take 1 tablet by mouth daily., Disp: 30 tablet, Rfl: 4    pantoprazole  (PROTONIX ) 40 MG tablet, Take 1 tablet (40 mg total) by mouth daily., Disp: , Rfl:     promethazine  (PHENERGAN ) 25 MG tablet, Take 1 tablet (25 mg total) by mouth every six (6) hours as needed for nausea., Disp: 30 tablet, Rfl: 1    sertraline  (ZOLOFT ) 100 MG tablet, Take 2 and 1/2 tablets (250 mg total) by mouth daily., Disp: 75 tablet, Rfl: 3    solifenacin (VESICARE) 5 MG tablet, Take 1 tablet (5 mg total) by mouth every morning., Disp: , Rfl:     Allergies  Adhesive, Adhesive tape-silicones, Etanercept, Latex, Mold, Other, Tramadol, Wound dressings, Ziprasidone, Ziprasidone hcl, and Ziprasidone mesylate    Family History[4]    Social History  Short Social History[5]         Physical Exam       ED Triage Vitals [12/25/23 1705]   Enc Vitals Group      BP 169/105      Pulse 96      SpO2 Pulse       Resp 20      Temp 36.8 ??C (98.2 ??F)      Temp Source Temporal      SpO2 99 %      Weight (!) 178.3 kg (393 lb)      Height 1.6 m (5' 3)       Physical Exam  Vitals and nursing note reviewed.   Constitutional:       General: She is not in acute distress.     Appearance: Normal appearance. She is ill-appearing. She is not toxic-appearing.      Comments: Ill appearing, but non toxic    HENT:      Head: Normocephalic and atraumatic.      Nose: Congestion present.      Mouth/Throat:      Mouth: Mucous membranes are dry.      Comments: Post oropharynx cobblestoning. Hoarse voice.   Eyes:      Conjunctiva/sclera: Conjunctivae normal.   Cardiovascular:      Rate and Rhythm: Normal rate and regular rhythm.      Pulses: Normal pulses.      Heart sounds: Normal heart sounds. No murmur heard.  Pulmonary:      Effort: Pulmonary effort is normal. No respiratory distress.      Breath sounds: Normal breath sounds. No wheezing, rhonchi or rales.   Abdominal:      General: Bowel sounds are normal. There is no distension.      Palpations: Abdomen is soft.      Tenderness: There is no abdominal tenderness. There is no right CVA tenderness, left CVA tenderness, guarding or rebound.   Musculoskeletal:         General: Normal range of motion.      Cervical back: Normal range of motion.   Skin:     General: Skin is warm.      Capillary Refill: Capillary refill takes less than 2 seconds.   Neurological:      General: No focal deficit present.      Mental Status: She is alert and oriented to person, place, and time.   Psychiatric:         Mood and Affect: Mood normal.         Behavior: Behavior normal.  Radiology     No orders to display        Procedures     None    Medical Decision Making and Critical Care      Independent Interpretation of Studies    I have independently interpreted the following studies:  None    Discussion of Management with other Providers or Support Staff    I discussed the management of this patient with the:  Case and plan discussed with ED attending, Dr. Polly.    Considerations Regarding Disposition/Escalation of Care and Critical Care    Indications for observation/admission (or consideration of observation/admission) and/or appropriateness for outpatient management: No indication for hospitalization at this time.   Patient/Family/Caregiver Discussions: Results and plan reviewed with patient who expresses understanding and is comfortable with discharge plan.   Diagnostic Tests Considered But Not Done: None  Prescription Drugs Provided or Considered But Not Given: amoxicillin  and azithromycin   Social Determinants of Health which significantly affected care: None    The chart was initially created by Lendon Amor using scribe services on December 25, 2023 6:08 PM, on behalf of Laneta Gallus, PA-C.    December 25, 2023 11:03 PM. Documentation assistance provided by the scribe. I was present during the time the encounter was recorded. The information recorded by the scribe was done at my direction and has been reviewed and validated by me.          [1]   Past Medical History:  Diagnosis Date    Anemia     Anxiety     Asthma (HHS-HCC)     Bipolar 1 disorder    (CMS-HCC)     Delayed emergence from general anesthesia     Depression     Disease of thyroid gland     Eczema     GBS bacteriuria 08/21/2021    PCN in labor    GERD (gastroesophageal reflux disease)     Headache     Hemorrhoids     History of pre-eclampsia in prior pregnancy, currently pregnant (HHS-HCC)     History of transfusion     Hypertension     IBS (irritable bowel syndrome)     IBS (irritable bowel syndrome)     Major depressive disorder     Migraine     Nexplanon insertion 06/16/2021    Inserted 09/01/21     Obesity     PONV (postoperative nausea and vomiting)     Postpartum depression     Psoriasis     Psychiatric Hospitalizations     PTSD (post-traumatic stress disorder)     Restless leg syndrome     Rh negative state in antepartum period (HHS-HCC) 05/31/2020    08/13/2021 rhogam given at [redacted]w[redacted]d     Seizures    (CMS-HCC) 05/13/2023    Due to severe preeclampsia    Self-injurious behavior     Sleep apnea, obstructive     Superimposed preeclampsia with severe features 05/16/2013    [x]  LDASA - On ASA for stent   [x]  baseline pre-e labs, if elevated spot of DM dx consider 24 hour urine  [x]  baseline EKG/TTE - followed by Cards, most recent echo 09/2020 with LVEF 55%  [x]  Fetal anatomic survey at 19-[redacted] weeks gestation  [x]  Serial growth ultrasounds every 4 weeks beginning at [redacted] weeks gestation if well controlled. If poorly controlled, recommend growth US  every 3-4 weeks from ti    Supervision of high risk pregnancy in third trimester (HHS-HCC) 03/24/2021  Dating: L=6                                           First trimester:   [x]  Prenatal labs reviewed   [ ]  SMA/Hgb electrophoresis/CF    [x]  Early 1 hr GTT*   [x]  Baseline HELLP*  [x]  LDASA* - on ASA and plavix  for venous stent   Third trimester:   [x]  CBC/RPR/HIV  [x]  1hr GTT  [x]  GBS+  [o] Anesthesia consult*  Fetal screening:    [x]  Anatomy US   [-] Fetal echo*  [o] Fetal growth US  every 4 weeks     Trauma     Urinary tract infection     more than 5 in lifetime    Varicella    [2]   Patient Active Problem List  Diagnosis    Episode of recurrent major depressive disorder    Obstructive sleep apnea    BMI >60    Psoriasis    Severe persistent asthma    Hypothyroidism affecting pregnancy in third trimester (HHS-HCC)    Idiopathic intracranial hypertension    Previous CLASSICAL cesarean delivery    Family history of autism    Chronic hypertension    Calculus of gallbladder with chronic 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    Chronic headaches    Cognitive impairment    Eosinophilic esophagitis    H/O urticaria    Continuous auditory hallucinations    Hidradenitis suppurativa    History of manic depressive disorder    History of MRSA infection    Iron  deficiency anemia    Metabolic syndrome    Lymphedema of both lower extremities    Neuropathy    Suicidal ideation, hx of manic depressive disorder    Encounter for procreative genetic counseling    Supervision of high risk pregnancy in first trimester (HHS-HCC)    Visual loss, both eyes    Elevated ALT measurement    Shortness of breath    Nausea   [3]   Past Surgical History:  Procedure Laterality Date    AN ARTERIAL LINE  05/12/2023    BRAIN SURGERY  11/27/20    CESAREAN SECTION  08/11/20- 09/01/21-05/12/23    CHOLECYSTECTOMY      EYE SURGERY  2022    NASAL SEPTUM SURGERY      OTHER SURGICAL HISTORY      Gallbladder, vp shunt, tonsils and adenoids and deviated, septum  and wisdom teeth and 4 stents in the brain    PR CESAREAN DELIVERY ONLY N/A 08/11/2020    Procedure: CESAREAN DELIVERY ONLY;  Surgeon: Lauraine Almarie Pizza, MD;  Location: L&D C-SECTION OR SUITES [facility];  Service: Maternal-Fetal Medicine    PR CESAREAN DELIVERY ONLY N/A 09/01/2021    Procedure: CESAREAN DELIVERY ONLY;  Surgeon: Genevia GORMAN Lav, MD;  Location: L&D C-SECTION OR SUITES [facility];  Service: Obstetrics    PR CESAREAN DELIVERY ONLY N/A 05/12/2023    Procedure: CESAREAN DELIVERY ONLY;  Surgeon: Manda Almarie Raveling, MD;  Location: L&D C-SECTION OR SUITES [facility];  Service: Obstetrics    PR DECOMPRESS OPTIC NERVE Bilateral 08/04/2020    Procedure: OPTIC NERV DECOMP;  Surgeon: Lonni JINNY Maillard, MD;  Location: MAIN OR [facility];  Service: Ophthalmology    TONSILLECTOMY AND ADENOIDECTOMY      WISDOM TOOTH EXTRACTION     [4]   Family History  Problem Relation Age of Onset  Multiple sclerosis Mother     Heart disease Mother     Depression Mother     Hypertension Mother     Retinal detachment Mother     Arthritis Mother     Mental illness Mother     Miscarriages / Stillbirths Mother     Bipolar disorder Father     Diabetes Father     Hypertension Father     Alcohol abuse Father     Drug abuse Father     Depression Father     Mental illness Father     Diabetes Maternal Grandmother     Heart disease Maternal Grandmother     Hypertension Maternal Grandmother     Depression Maternal Grandmother     Clotting disorder Maternal Grandmother         Pulmonary embolism    Mental illness Maternal Grandmother     Aneurysm Maternal Grandmother     Heart disease Maternal Grandfather         sudden death age 61    Diabetes Maternal Grandfather     Hypertension Maternal Grandfather     Hypertension Paternal Grandmother     Diabetes Paternal Grandmother     Diabetes Paternal Grandfather     Hypertension Paternal Grandfather     Cancer Other     Mental illness Other     Melanoma Neg Hx     Basal cell carcinoma Neg Hx     Squamous cell carcinoma Neg Hx     Glaucoma Neg Hx     Macular degeneration Neg Hx    [5]   Social History  Tobacco Use    Smoking status: Never    Smokeless tobacco: Never   Vaping Use    Vaping status: Never Used   Substance Use Topics    Alcohol use: Not Currently    Drug use: Never        Tawana Pasch Danielle, [place]  12/25/23 2313

## 2023-12-25 NOTE — ED Triage Note (Signed)
 Presents with fever, cough, chills, and nausea x 3 weeks. Says she saw her OB and they wanted her to be seen at the ED for possible pneumonia. Last took tylenol  this AM at 0900. Pt is [redacted] weeks pregnant.

## 2023-12-26 MED ORDER — AZITHROMYCIN 250 MG TABLET
ORAL_TABLET | Freq: Every day | ORAL | 0 refills | 4.00000 days | Status: CP
Start: 2023-12-26 — End: 2023-12-30

## 2023-12-26 MED ADMIN — potassium chloride ER tablet 40 mEq: 40 meq | ORAL | @ 02:00:00 | Stop: 2023-12-25

## 2023-12-26 MED ADMIN — sodium chloride 0.9% (NS) bolus 1,000 mL: 1000 mL | INTRAVENOUS | @ 01:00:00 | Stop: 2023-12-25

## 2023-12-26 MED ADMIN — azithromycin (ZITHROMAX) tablet 500 mg: 500 mg | ORAL | @ 03:00:00 | Stop: 2023-12-25

## 2023-12-26 MED ADMIN — amoxicillin (AMOXIL) capsule 500 mg: 500 mg | ORAL | @ 03:00:00 | Stop: 2023-12-25

## 2023-12-28 NOTE — Progress Notes (Signed)
 12/28/2023 - Patient originally requested delivery for 01/02/24. Delivery is not possible on this date due to ups restrictions. I have reached out to the patient and confirmed that delivery on 01/03/24 is ok.  [facility] Pharm[facility] Refill Coordination Note    Tara Christensen, DOB: 04/03/1995  Phone: There are no phone numbers on file.      All above HIPAA information was verified with patient.         12/22/2023    10:45 AM   Specialty Rx Medication Refill Questionnaire   Which Medications would you like refilled and shipped? Cizima   Please list all current allergies: Adhesive tape and ultram   Have you missed any doses in the last 30 days? No   Have you had any changes to your medication(s) since your last refill? No   How much of each medication do you have remaining at home? (eg. number of tablets, injections, etc.) 0   If receiving an injectable medication, next injection date is 01/05/2024   Have you experienced any side effects in the last 30 days? No   Please enter the full address (street address, city, state, zip code) where you would like your medication(s) to be delivered to. [address], [place], [place] [address]   Please specify on which day you would like your medication(s) to arrive. Note: if you need your medication(s) within 3 days, please call the pharmacy to schedule your order at [phone]  01/02/2024   Has your insurance changed since your last refill? No   Would you like a pharmacist to call you to discuss your medication(s)? No   Do you require a signature for your package? (Note: if we are billing Medicare Part B or your order contains a controlled substance, we will require a signature) No   I have been provided my out of pocket cost for my medication and approve the pharmacy to charge the amount to my credit card on file. Yes         Completed refill call assessment today to schedule patient's medication shipment from the [facility] [phone]).  All relevant notes have been reviewed.       Confirmed patient received a Conservation Officer, Historic Buildings and a Surveyor, Mining with first shipment. The patient will receive a drug information handout for each medication shipped and additional FDA Medication Guides as required.         REFERRAL TO PHARMACIST     Referral to the pharmacist: Not needed      [facility]     Shipping address confirmed in Epic.     Delivery Scheduled: Yes, Expected medication delivery date: 01/03/24.     Medication will be delivered via UPS to the prescription address in Epic WAM.    [name]   [facility] Special[facility] Home Delivery Pharmacy Specialty Technician

## 2024-01-02 MED FILL — CIMZIA 400 MG/2 ML (200 MG/ML X 2) SUBCUTANEOUS SYRINGE KIT: SUBCUTANEOUS | 28 days supply | Qty: 2 | Fill #1

## 2024-01-03 DIAGNOSIS — O99283 Endocrine, nutritional and metabolic diseases complicating pregnancy, third trimester: Principal | ICD-10-CM

## 2024-01-03 DIAGNOSIS — I10 Essential (primary) hypertension: Principal | ICD-10-CM

## 2024-01-03 DIAGNOSIS — E039 Hypothyroidism, unspecified: Principal | ICD-10-CM

## 2024-01-03 DIAGNOSIS — R519 Chronic nonintractable headache, unspecified headache type: Principal | ICD-10-CM

## 2024-01-03 DIAGNOSIS — O0991 Supervision of high risk pregnancy, unspecified, first trimester: Principal | ICD-10-CM

## 2024-01-03 DIAGNOSIS — G8929 Other chronic pain: Principal | ICD-10-CM

## 2024-01-03 DIAGNOSIS — O99210 Obesity complicating pregnancy, unspecified trimester: Principal | ICD-10-CM

## 2024-01-03 DIAGNOSIS — L409 Psoriasis, unspecified: Principal | ICD-10-CM

## 2024-01-03 MED ORDER — ACETAZOLAMIDE ER 500 MG CAPSULE,EXTENDED RELEASE
ORAL_CAPSULE | Freq: Three times a day (TID) | ORAL | 4 refills | 90.00000 days | Status: CP
Start: 2024-01-03 — End: 2025-03-28

## 2024-01-03 NOTE — Assessment & Plan Note (Addendum)
 cHTN currently at goal on labetalol  and hydralazone

## 2024-01-03 NOTE — Progress Notes (Signed)
 [facility]                                                                         Return Prenatal Note    Assessment/Plan:   Plan   28 y.o. [phone] at [redacted]w[redacted]d presents for a follow-up OB visit for high risk pregnancy.     Assessment & Plan  Supervision of high risk pregnancy in first trimester (HHS-HCC)  Prenatal care UTD  Anatomy scan scheduled for next visit  Hypothyroidism affecting pregnancy in third trimester (HHS-HCC)  Will check TFTs with next blood draw    Chronic hypertension  cHTN currently at goal on labetalol  and hydralazone  BMI >60  Notes she has lost weight recently - states she is not hungry due to stressors. Will continue to work with therapist and medication management for psychiatric symptoms    Chronic nonintractable headache, unspecified headache type  Following with neuro-ophthalmology given pseudotumor cerebri. Will need a new neurosurgeon as Dr. Kassie is leaving [facility].   Psoriasis  Continues on cimzia           Future Appointments   Date Time Provider Department Center   01/19/2024 10:00 AM [facility] OB US  RM 1 [id number] [place] - MFM at   01/19/2024 11:20 AM Melson, [place] LABOR, MD [facility]   02/15/2024  9:30 AM Berneda Bruckner, MD [facility]   03/06/2024 11:00 AM Dominique Jenkins Setter, MD [facility]   04/17/2024  6:30 PM SLEEP 10 [id number]        Schedule appointment sometime the first week of January, with me if possible    Subjective:         28 y.o. [id number] at [redacted]w[redacted]d presents for follow-up OB visit for high risk pregnancy. Patient reports no fetal movement and denies contractions, leakage of fluid, or vaginal bleeding. Having a lot of social stressors. Planning for residence @ [place] starting in January if this remains a possibility. No recent HA.       Objective:        The following portions of the patient's history were reviewed and updated as appropriate: allergies, current medications, past obstetric history, past family history, past medical history, past social history, past surgical history, and problem list    Flowsheet Vitals:  Temp: 36.9 ??C (98.4 ??F)  Pulse: 77  BP: 126/68  Movement: Absent  Loss of Fluid: No  Bleeding: No  Contractions: Not present  Fetal Heart Rate: 137  Total weight gain: -4.173 kg (-9 lb 3.2 oz)    General Appearance No acute distress, well appearing, and well nourished.   Pulmonary Normal work of breathing.   Cardiovascular Normal rate.  Edema none.      Gastronintestinal Abdomen soft, gravid, no rebound or guarding.   Genitourinary SVE deferred     Muskuloskeletal Normal gait, grossly normal range of motion   Neurologic Alert and oriented to person, place, and time   Psychiatric  Integumentary Mood and affect within normal limits  Skin is warm, dry, no rash noted   This encounter was coded based on time. I personally spent 20 minutes face-to-face and non-face-to-face in the care of this patient, which includes all pre, intra,  and post visit time on the date of service.     Zelda Gaskins, MD

## 2024-01-03 NOTE — Assessment & Plan Note (Addendum)
 Following with neuro-ophthalmology given pseudotumor cerebri. Will need a new neurosurgeon as Dr. Kassie is leaving [facility].

## 2024-01-03 NOTE — Assessment & Plan Note (Addendum)
-   Will check TFTs with next blood draw

## 2024-01-03 NOTE — Assessment & Plan Note (Addendum)
 Continues on cimzia 

## 2024-01-03 NOTE — Assessment & Plan Note (Signed)
 Prenatal care UTD  Anatomy scan scheduled for next visit

## 2024-01-03 NOTE — Addendum Note (Signed)
 Addended by: DOMINIQUE CALDRON MELISSA on: 01/03/2024 12:18 PM     Modules accepted: Orders

## 2024-01-03 NOTE — Assessment & Plan Note (Addendum)
 Notes she has lost weight recently - states she is not hungry due to stressors. Will continue to work with therapist and medication management for psychiatric symptoms

## 2024-01-04 NOTE — Progress Notes (Addendum)
 Food Insecurity Screening was Positive 01/03/24 in the outpatient care setting and the following interventions were provided Box/bag of groceries .  Shelia KATHEE Rocks, CMA

## 2024-01-07 ENCOUNTER — Emergency Department
Admit: 2024-01-07 | Discharge: 2024-01-07 | Disposition: A | Discharge status: Home / Self Care | Payer: Medicaid (Managed Care) | Attending: Family Medicine

## 2024-01-07 DIAGNOSIS — O4692 Antepartum hemorrhage, unspecified, second trimester: Principal | ICD-10-CM

## 2024-01-07 DIAGNOSIS — O4442 Low lying placenta NOS or without hemorrhage, second trimester: Principal | ICD-10-CM

## 2024-01-07 LAB — URINALYSIS WITH MICROSCOPY
BILIRUBIN UA: NEGATIVE
BLOOD UA: NEGATIVE
GLUCOSE UA: NEGATIVE
KETONES UA: NEGATIVE
LEUKOCYTE ESTERASE UA: NEGATIVE
NITRITE UA: NEGATIVE
PH UA: 7 (ref 5.0–7.0)
PROTEIN UA: NEGATIVE
RBC UA: 2 /HPF (ref 0–3)
SPECIFIC GRAVITY UA: 1.02 (ref 1.005–1.030)
SQUAMOUS EPITHELIAL: 3 /HPF (ref 0–5)
UROBILINOGEN UA: 1
WBC UA: 2 /HPF (ref 0–3)

## 2024-01-07 NOTE — Consults (Signed)
 I was asked by Dr Cristopher to see this patient with concerns for vaginal spotting in the second trimester & decreased fetal movement.     Tara Christensen is a 28 year old G4P1203 at 18'2 weeks.  She has a history of preterm births due to pre-eclampsia as well as morbid obesity.  She follows with the [facility]FM group & plans to deliver at [facility].  She had one ultrasound at 8 weeks which showed a single viable IUP.    She noticed spotting since yesterday, on toilet tissue after voiding.  Not used pads, not noted blood in her underwear.  No vaginal discharge, recent intercourse or trauma.  She was worried as she had felt flutters previously, but not feeling now.  No other concerns.      Afebrile. BP 153/73, patient took all prescribed medications today so far. All other vitals WNL. No distress currently.     Bedside ultrasound performed (unable to save or print pictures)  Single IUP seen, cephalic.  Copious movement seen in head, arms & legs.  Cardiac activity with four-chambered heart seen at 140's.  Grossly normal amniotic fluid.  Posterior low-lying placenta with the edge of placenta over the internal os.  No obvious pockets of fluid between the uterine wall & placenta.  Unable to measure cervix given body habitus.    Assessment:  Placenta previa.  No concerns for fetal demise as fetal cardiac activity seen.  No obvious abruption noted.  Patient expressed relief to see fetal movement on ultrasound.    Plan:  Continue routine care.  Recommended pelvic rest.  Follow-up ultrasound to evaluate placenta.  Check for vaginal infections.  Follow up this week with her MFM OB.  Recommendations directly communicated with Dr Cristopher.    Tara Alonso Sharps MD MPH

## 2024-01-07 NOTE — ED Triage Note (Signed)
 [redacted]w[redacted]d pregnant patient here with vaginal spotting since yesterday. Patient states that she only notices this when she wipes after using restroom and has not had to place a pad in underpants. Denies abdominal pain, other vaginal discharge, and new urinary symptoms.    Afebrile. BP 153/73, patient took all prescribed medications today so far. All other vitals WNL. No distress currently.

## 2024-01-07 NOTE — ED Provider Notes (Addendum)
 [facility] eMERGENCY dEPARTMENT eNCOUnter        CLINICAL IMPRESSION  Final diagnoses:   Second trimester bleeding (HHS-HCC) (Primary)   Low-lying placenta in second trimester (HHS-HCC)       ASSESSMENT & PLAN  Medical Decision Making  28 year old female presents to the emergency department due to vaginal spotting.  She is 18 weeks 2 days pregnant.  Urinalysis is negative for infection.  Vaginitis molecular panel is negative.  Bedside ultrasound performed by Dr. Belvie Sharps notes fetal movement and a fetal heartbeat at 140 bpm.  The placenta appears to be low-lying and it is possible that she may have a partial placenta previa.  She is advised to follow-up with her OB doctor next week regarding her ED visit and to practice pelvic rest.  She is given strict return precautions.    _______________________________________________________________________    EKG:  Radiology Studies:    Historians interviewed Patient   Studies independently reviewed  None   Consultants involved  None   Prior records reviewed  I reviewed patient's recent prenatal visit from 11/25   Testing considered None   Social factors affecting disposition  None   Disposition  Safe for discharge home   _______________________________________________________________________    Procedures     ED Course as of 01/10/24 0906   Sat Jan 07, 2024   1507 Bedside ultrasound was performed by Dr. Belvie Sharps.  There is fetal movement and a fetal heartbeat noted at 140 bpm.  The placenta appears to be low-lying and she may have a partial previa.         _______________________________________________________________    Time seen: January 07, 2024 2:54 PM    I have reviewed the triage vital signs and the nursing notes.    CHIEF COMPLAINT    Chief Complaint   Patient presents with    Vaginal Bleeding - Pregnant       HPI   History of Present Illness    28 year old female G4 P0-3-0-3 at 18 weeks 2 days with past medical history of HTN, bipolar 1 disorder, anemia, asthma and seizures presents to the emergency department today due to vaginal spotting since yesterday.  She only notices it when she wipes.  She does not have to wear a pad.  She denies any abdominal pain or other vaginal discharge.  No dysuria.  She had an ultrasound on 11/01/2023 for confirmation of IUP.  At that time she was noted to have a small subchorionic hemorrhage.      PAST MEDICAL HISTORY    Past Medical History[1]    Problem List[2]    SURGICAL HISTORY    Past Surgical History[3]    CURRENT MEDICATIONS    No current facility-administered medications for this encounter.    Current Outpatient Medications:     buPROPion  (WELLBUTRIN  SR) 150 MG 12 hr tablet, Take 1 tablet (150 mg total) by mouth in the morning., Disp: , Rfl:     butalbital -acetaminophen -caffeine  (ESGIC ) 50-325-40 mg per tablet, Take 1 tablet by mouth every four (4) hours as needed., Disp: , Rfl:     hydrALAZINE  (APRESOLINE ) 25 MG tablet, Take 2 tablets (50 mg total) by mouth four (4) times a day., Disp: , Rfl:     acetazolamide  (DIAMOX ) 500 mg ER 12 hr capsule, Take 1 capsule (500 mg total) by mouth Three (3) times a day., Disp: 270 capsule, Rfl: 4    aspirin  (ECOTRIN) 81 MG tablet, Take 2 tablets (162 mg total) by mouth daily., Disp:  30 tablet, Rfl: 9    budesonide -formoterol  (SYMBICORT ) 80-4.5 mcg/actuation inhaler, Inhale 2 puffs two (2) times a day., Disp: 10.2 g, Rfl: 3    CIMZIA  400 mg/2 mL (200 mg/mL x 2) SyKt injection syringe, Inject the contents of 2 syringes (400 mg total) under the skin every fourteen (14) days., Disp: 2 each, Rfl: 11    cyclobenzaprine  (FLEXERIL ) 10 MG tablet, Take 1 tablet (10 mg total) by mouth nightly. TAKE 1 TABLET (10 MG TOTAL) BY MOUTH NIGHTLY., Disp: 30 tablet, Rfl: 3    docusate sodium  (COLACE) 100 MG capsule, Take 1 capsule (100 mg total) by mouth two (2) times a day., Disp: 60 capsule, Rfl: 11    labetalol  (NORMODYNE ) 200 MG tablet, Take 3 tablets (600 mg total) by mouth two (2) times a day., Disp: 180 tablet, Rfl: 11    levothyroxine  (SYNTHROID ) 75 MCG tablet, Take 1 tablet (75 mcg total) by mouth daily., Disp: 90 tablet, Rfl: 2    magnesium  oxide (MAG-OX) 400 mg (241.3 mg elemental magnesium ) tablet, Take 1 tablet by mouth daily., Disp: , Rfl:     montelukast  (SINGULAIR ) 10 mg tablet, Take 1 tablet (10 mg total) by mouth nightly., Disp: 30 tablet, Rfl: 0    multivitamin, prenatal, folic acid-iron , (PRENATAL VITAMIN PLUS LOW IRON ) 27-1 mg Tab, Take 1 tablet by mouth daily., Disp: 30 tablet, Rfl: 4    pantoprazole  (PROTONIX ) 40 MG tablet, Take 1 tablet (40 mg total) by mouth daily., Disp: , Rfl:     promethazine  (PHENERGAN ) 25 MG tablet, Take 1 tablet (25 mg total) by mouth every six (6) hours as needed for nausea., Disp: 30 tablet, Rfl: 1    sertraline  (ZOLOFT ) 100 MG tablet, Take 2 and 1/2 tablets (250 mg total) by mouth daily., Disp: 75 tablet, Rfl: 3    solifenacin (VESICARE) 5 MG tablet, Take 1 tablet (5 mg total) by mouth every morning., Disp: , Rfl:     ALLERGIES    Allergies[4]    FAMILY HISTORY    Family History[5]    SOCIAL HISTORY  Social History[6]      PHYSICAL EXAM      VITAL SIGNS:    ED Triage Vitals [01/07/24 1323]   Enc Vitals Group      BP 153/73      Pulse 80      SpO2 Pulse       Resp 16      Temp 37.1 ??C (98.7 ??F)      Temp Source Temporal      SpO2 100 %      Weight (!) 163.3 kg (360 lb)      Height 1.6 m (5' 3)      Head Circumference       Peak Flow       Pain Score       Pain Loc       Pain Education       Exclude from Growth Chart        Constitutional: Alert and oriented. Well appearing and in no distress.  HEENT       Head: Normocephalic and atraumatic.       Mouth/Throat: Normal voice. Mucous membranes are moist.        Neck: No stridor.  Cardiovascular: Normal rate, regular rhythm. No murmur. No LE edema.  Respiratory: Normal respiratory effort. CTAB with no rales, rhonchi, or wheeze.  Good air movement.  Gastrointestinal: Soft and nontender. There is no CVA tenderness.  Vaginal Exam:   Small amount of brown discharge.  Cervix closed.  Musculoskeletal: Normal range of motion in all extremities.  Neurologic: Normal speech and language. No gross focal neurologic deficits are appreciated.   Skin: Skin is warm, dry and intact. No rash noted.  Psychiatric: Mood and affect are normal. Speech and behavior are normal.    Physical Exam      RADIOLOGY    No orders to display       LABS  Labs Reviewed   URINALYSIS WITH MICROSCOPY - Abnormal; Notable for the following components:       Result Value    Bacteria, UA Moderate (*)     Mucus, UA Occasional (*)     All other components within normal limits   URINE CULTURE - Normal    Narrative:     Specimen Source: Clean Catch   VAGINITIS MOLECULAR PANEL - Normal    Narrative:     The Cepheid Xpert Xpress MVP test is a qualitative in vitro PCR test capable of detecting DNA from organisms associated with vaginosis from vaginal swabs. Organisms detected in this assay include Atopobium spp., Bacterial Vaginosis-Associated Bacterium 2 (BVAB2), Megasphaera-1, Candida spp. (C. albicans, C tropicalis, C. parapsilosis, and C. dubliniensis are not differentiatied), Candida glabrata/Candida krusei (not differentiated), and Trichomonas vaginalis. This test is intended to aid in the diagnosis of vaginal infections in women and should be used in conjunction with clinical and other laboratory data. Negative results do not preclude the possiblity of infection. This test has not been evaluated in women less than 3 years of age.                                    This assays characteristics have been validated by the [facility].         ATTESTATIONS      Portions of this record may have been created using Scientist, clinical (histocompatibility and immunogenetics). Dictation errors have been sought, but may not have been identified and corrected.       Cristopher Leita Caldron, MD  01/10/24 [phone]         [1]   Past Medical History:  Diagnosis Date    Anemia     Anxiety     Asthma (HHS-HCC) Bipolar 1 disorder    (CMS-HCC)     Delayed emergence from general anesthesia     Depression     Disease of thyroid gland     Eczema     GBS bacteriuria 08/21/2021    PCN in labor    GERD (gastroesophageal reflux disease)     Headache     Hemorrhoids     History of pre-eclampsia in prior pregnancy, currently pregnant (HHS-HCC)     History of transfusion     Hypertension     IBS (irritable bowel syndrome)     IBS (irritable bowel syndrome)     Major depressive disorder     Migraine     Nexplanon insertion 06/16/2021    Inserted 09/01/21     Obesity     PONV (postoperative nausea and vomiting)     Postpartum depression     Psoriasis     Psychiatric Hospitalizations     PTSD (post-traumatic stress disorder)     Restless leg syndrome     Rh negative state in antepartum period (HHS-HCC) 05/31/2020    08/13/2021 rhogam given at [redacted]w[redacted]d  Seizures    (CMS-HCC) 05/13/2023    Due to severe preeclampsia    Self-injurious behavior     Sleep apnea, obstructive     Superimposed preeclampsia with severe features 05/16/2013    [x]  LDASA - On ASA for stent   [x]  baseline pre-e labs, if elevated spot of DM dx consider 24 hour urine  [x]  baseline EKG/TTE - followed by Cards, most recent echo 09/2020 with LVEF 55%  [x]  Fetal anatomic survey at 19-[redacted] weeks gestation  [x]  Serial growth ultrasounds every 4 weeks beginning at [redacted] weeks gestation if well controlled. If poorly controlled, recommend growth US  every 3-4 weeks from ti    Supervision of high risk pregnancy in third trimester (HHS-HCC) 03/24/2021    Dating: L=6                                           First trimester:   [x]  Prenatal labs reviewed   [ ]  SMA/Hgb electrophoresis/CF    [x]  Early 1 hr GTT*   [x]  Baseline HELLP*  [x]  LDASA* - on ASA and plavix  for venous stent   Third trimester:   [x]  CBC/RPR/HIV  [x]  1hr GTT  [x]  GBS+  [o] Anesthesia consult*  Fetal screening:    [x]  Anatomy US   [-] Fetal echo*  [o] Fetal growth US  every 4 weeks     Trauma     Urinary tract infection more than 5 in lifetime    Varicella    [2]   Patient Active Problem List  Diagnosis    Episode of recurrent major depressive disorder    Obstructive sleep apnea    BMI >60    Psoriasis    Severe persistent asthma    Hypothyroidism affecting pregnancy in third trimester (HHS-HCC)    Idiopathic intracranial hypertension    Previous CLASSICAL cesarean delivery    Family history of autism    Chronic hypertension    Calculus of gallbladder with chronic 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    Chronic headaches    Cognitive impairment    Eosinophilic esophagitis    Continuous auditory hallucinations    Hidradenitis suppurativa    History of manic depressive disorder    History of MRSA infection    Iron  deficiency anemia    Metabolic syndrome    Lymphedema of both lower extremities    Neuropathy    Suicidal ideation, hx of manic depressive disorder    Encounter for procreative genetic counseling    Supervision of high risk pregnancy in first trimester (HHS-HCC)    Visual loss, both eyes   [3]   Past Surgical History:  Procedure Laterality Date    AN ARTERIAL LINE  05/12/2023    BRAIN SURGERY  11/27/20    CESAREAN SECTION  08/11/20- 09/01/21-05/12/23    CHOLECYSTECTOMY      EYE SURGERY  2022    NASAL SEPTUM SURGERY      OTHER SURGICAL HISTORY      Gallbladder, vp shunt, tonsils and adenoids and deviated, septum  and wisdom teeth and 4 stents in the brain    PR CESAREAN DELIVERY ONLY N/A 08/11/2020    Procedure: CESAREAN DELIVERY ONLY;  Surgeon: Lauraine Almarie Pizza, MD;  Location: L&D C-SECTION OR SUITES [facility];  Service: Maternal-Fetal Medicine    PR CESAREAN DELIVERY ONLY N/A 09/01/2021    Procedure: CESAREAN DELIVERY ONLY;  Surgeon: Kavita S Arora,  MD;  Location: L&D C-SECTION OR SUITES [facility];  Service: Obstetrics    PR CESAREAN DELIVERY ONLY N/A 05/12/2023    Procedure: CESAREAN DELIVERY ONLY;  Surgeon: Manda Almarie Raveling, MD;  Location: L&D C-SECTION OR SUITES [facility];  Service: Obstetrics    PR DECOMPRESS OPTIC NERVE Bilateral 08/04/2020    Procedure: OPTIC NERV DECOMP;  Surgeon: Lonni JINNY Maillard, MD;  Location: MAIN OR [facility];  Service: Ophthalmology    TONSILLECTOMY AND ADENOIDECTOMY      WISDOM TOOTH EXTRACTION     [4]   Allergies  Allergen Reactions    Adhesive Rash    Latex Hives    Other Rash    Tramadol Itching and Rash    Wound Dressings Rash    Ziprasidone Nausea And Vomiting    Etanercept Other (See Comments)     Unknown reaction; Positive on Allergy Testing    Mold Other (See Comments)     Unknown reaction; Positive on allergy testing   [5]   Family History  Problem Relation Age of Onset    Multiple sclerosis Mother     Heart disease Mother     Depression Mother     Hypertension Mother     Retinal detachment Mother     Arthritis Mother     Mental illness Mother     Miscarriages / Stillbirths Mother     Bipolar disorder Father     Diabetes Father     Hypertension Father     Alcohol abuse Father     Drug abuse Father     Depression Father     Mental illness Father     Diabetes Maternal Grandmother     Heart disease Maternal Grandmother     Hypertension Maternal Grandmother     Depression Maternal Grandmother     Clotting disorder Maternal Grandmother         Pulmonary embolism    Mental illness Maternal Grandmother     Aneurysm Maternal Grandmother     Heart disease Maternal Grandfather         sudden death age 43    Diabetes Maternal Grandfather     Hypertension Maternal Grandfather     Hypertension Paternal Grandmother     Diabetes Paternal Grandmother     Diabetes Paternal Grandfather     Hypertension Paternal Grandfather     Cancer Other     Mental illness Other     Melanoma Neg Hx     Basal cell carcinoma Neg Hx     Squamous cell carcinoma Neg Hx     Glaucoma Neg Hx     Macular degeneration Neg Hx    [6]   Social History  Socioeconomic History    Marital status: Single     Spouse name: None    Number of children: 3    Years of education: None    Highest education level: None   Tobacco Use    Smoking status: Never Passive exposure: Never    Smokeless tobacco: Never   Vaping Use    Vaping status: Never Used   Substance and Sexual Activity    Alcohol use: Not Currently    Drug use: Never    Sexual activity: Yes     Partners: Male     Birth control/protection: None   Other Topics Concern    Do you use sunscreen? Yes    Tanning bed use? No    Are you easily burned? Yes    Excessive sun  exposure? No    Blistering sunburns? No     Social Drivers of Health     Food Insecurity: Food Insecurity Present (01/03/2024)    Hunger Vital Sign     Worried About Running Out of Food in the Last Year: Sometimes true     Ran Out of Food in the Last Year: Sometimes true   Tobacco Use: Low Risk (01/07/2024)    Patient History     Smoking Tobacco Use: Never     Smokeless Tobacco Use: Never     Passive Exposure: Never   Transportation Needs: Unmet Transportation Needs (09/28/2023)    PRAPARE - Transportation     Lack of Transportation (Medical): Yes     Lack of Transportation (Non-Medical): Yes   Alcohol Use: Not At Risk (07/22/2023)    Alcohol Use     How often do you have a drink containing alcohol?: Never     How often do you have 5 or more drinks on one occasion?: Never   Housing: Low Risk (09/28/2023)    Housing     Within the past 12 months, have you ever stayed: outside, in a car, in a tent, in an overnight shelter, or temporarily in someone else's home (i.e. couch-surfing)?: No     Are you worried about losing your housing?: No   Physical Activity: Insufficiently Active (04/08/2022)    Received from [facility]    Exercise Vital Sign     On average, how many days per week do you engage in moderate to strenuous exercise (like a brisk walk)?: 3 days     On average, how many minutes do you engage in exercise at this level?: 20 min   Utilities: Low Risk (09/28/2023)    Utilities     Within the past 12 months, have you been unable to get utilities (heat, electricity) when it was really needed?: No   Stress: Stress Concern Present (04/08/2022) Received from [facility] of Occupational Health - Occupational Stress Questionnaire     Feeling of Stress : To some extent   Interpersonal Safety: Not At Risk (12/25/2023)    Interpersonal Safety     Unsafe Where You Currently Live: No     Physically Hurt by Anyone: No     Abused by Anyone: No   Substance Use: Low Risk (10/04/2023)    Substance Use     In the past year, how often have you used prescription drugs for non-medical reasons?: Never     In the past year, how often have you used illegal drugs?: Never     In the past year, have you used any substance for non-medical reasons?: No   Social Connections: Socially Integrated (04/08/2022)    Received from [facility]    Social Network     How would you rate your social network (family, work, friends)?: Good participation with social networks   Financial Resource Strain: Medium Risk (09/28/2023)    Overall Financial Resource Strain (CARDIA)     Difficulty of Paying Living Expenses: Somewhat hard   Internet Connectivity: Internet connectivity concern unknown (09/28/2023)    Internet Connectivity     Do you have access to internet services: Yes        Milus Fritze, Leita Caldron, MD  01/10/24 [phone]

## 2024-01-13 NOTE — Progress Notes (Signed)
 Social Work Progress Note                                            OB/GYN Clinic    Referral Source: Dr. Dominique    Referral Reason: Lodging at [facility]     Summary:   SW placed outreach call and sent mychart message to Pt regarding request from Pt and Dr. Dominique for referral to [employer]. Per Dr. Dominique, Pt prefers to stay near the hospital/clinics in the last months of pregnancy due to high risk factors and frequent appts. This arrangement was made during her last pregnancy.     Next Steps: SW will remain available for support and resource coordination as needed. SW will make additional outreach to offer assistance with lodging.     Shanda Lecher, LCSW, ACM  Social Worker  [facility]  [phone]  [email or web address]     01/13/2024  10:04 AM

## 2024-01-18 NOTE — Progress Notes (Unsigned)
 [facility]                                                                         Return Prenatal Note    Assessment/Plan:   Plan   28 y.o. [phone] at [redacted]w[redacted]d presents for a follow-up OB visit for high risk pregnancy.     Supervision of high risk pregnancy in first trimester (HHS-HCC)  - ED visit 11/29 due to vaginal bleeding in pregnancy. Fzound to have low lying pregnancy.   - UTD with prenatal care  - Anatomy US  performed today w/ following findings: **  - Patient contineus to report she is taking aspirin  81 mg daily    Eval OSA and Asthma, idopathic intracranial HTN, mood       Future Appointments   Date Time Provider Department Center   01/19/2024 10:00 AM [facility] OB US  RM 2 [id number] [place] - MFM at   01/19/2024 11:20 AM Adaia Matthies, [place] LABOR, MD [facility]   02/15/2024  9:30 AM Berneda Bruckner, MD [facility]   03/06/2024 11:00 AM Dominique Jenkins Setter, MD [facility]   04/17/2024  6:30 PM SLEEP 10 [id number]            Subjective:         28 y.o. [id number] at [redacted]w[redacted]d presents for follow-up OB visit for high risk pregnancy. Patient reports ***normal fetal movement and denies contractions, leakage of fluid, or vaginal bleeding.      Objective:        {MFM common smartlinks:21902}    Flowsheet Vitals:     Total weight gain: -4.173 kg (-9 lb 3.2 oz)    General Appearance No acute distress, well appearing, and well nourished.   Pulmonary Normal work of breathing.   Cardiovascular ***Normal rate.  Edema {Numbers; edema:17696}.      Gastronintestinal Abdomen soft, gravid, no rebound or guarding.   Genitourinary {MFMSVE:107962::SVE deferred}     Muskuloskeletal Normal gait, grossly normal range of motion   Neurologic Alert and oriented to person, place, and time   Psychiatric  Integumentary Mood and affect within normal limits  Skin is warm, dry, no rash noted   This encounter was coded based on {time, MFM MDM:74003}.

## 2024-01-18 NOTE — Assessment & Plan Note (Addendum)
-   ED visit 11/29 due to vaginal bleeding in pregnancy. Fzound to have low lying pregnancy.   - UTD with prenatal care  - Anatomy US  performed today w/ following findings: **  - Patient contineus to report she is taking aspirin  81 mg daily

## 2024-01-18 NOTE — Assessment & Plan Note (Signed)
-   On labetalol  & hydral  - BP **

## 2024-01-20 NOTE — Progress Notes (Signed)
 Social Work Progress Note                                             OB/GYN Clinic    SW spoke with Pt to complete referral for [facility] referral. Submitted referral with dates from 02/24/24 ([redacted] weeks ga) to 06/07/24 (edd).  Pt will follow up with directly with [facility]. She will alert SW if more information is needed.     Shanda Lecher, LCSW, ACM  Social Worker  [facility]  [phone]  [email or web address]     01/20/2024  3:33 PM

## 2024-01-24 DIAGNOSIS — Z20828 Contact with and (suspected) exposure to other viral communicable diseases: Principal | ICD-10-CM

## 2024-01-24 MED ORDER — OSELTAMIVIR 75 MG CAPSULE
ORAL_CAPSULE | Freq: Two times a day (BID) | ORAL | 0 refills | 5.00000 days | Status: CP
Start: 2024-01-24 — End: 2024-01-29

## 2024-01-24 NOTE — Telephone Encounter (Signed)
 Gynecology On Call Telephone Note      Tara Christensen is a 28 y.o. [phone] who called the Gynecology On Call Pager who did not answer return call. Patient with HIPAA compliant voicemail left instructing repage if still in need of assistance.     Avamae Dehaan H Tab Rylee, MD  Obstetrics & Gynecology PGY-2  [facility]

## 2024-01-24 NOTE — Telephone Encounter (Signed)
 Gynecology On Call Telephone Note    Tara Christensen is a 28 y.o. [phone] who called the Gynecology On Call Pager with concern for her youngest child coming in to the ER this Monday with influenza. She reports concern for the flu coming into the house. She is having a fever at home with a maximum of 101.4. She also reports congestion, fatigue, rhinorrhea, headache, cough. She is reporting adequate PO intake and still peeing a normal amount. She reports she last took Tylenol  at 1900 this evening. She reports she took 500 mg. Recommended patient can take Tylenol  1000 mg up to every 6 hours (but no more). Ordered patient Tamiflu  and Tylenol  to her home pharmacy. Discussed recommendations to present to Urgent Care/ED locally if unable to tolerate PO or decreased urine output or fevers nearing 103?? F (39.5?? C)     [name], MD  Obstetrics & Gynecology PGY-2  [facility]

## 2024-01-25 NOTE — Progress Notes (Signed)
 01/25/2024 - Patient originally requested delivery for 02/06/24. Delivery is not possible on this date due to ups restrictions. I have reached out to the patient and confirmed that delivery on 02/07/24 is ok.  [facility] Special[facility] and Home Delivery Pharmacy Refill Coordination Note    Tara Christensen, DOB: [birth year]  Phone: There are no phone numbers on file.      All above HIPAA information was verified with patient.         01/23/2024    12:38 PM   Specialty Rx Medication Refill Questionnaire   Which Medications would you like refilled and shipped? Cizima   Please list all current allergies: Ultram and adhesive tape   Have you missed any doses in the last 30 days? No   Have you had any changes to your medication(s) since your last refill? No   How much of each medication do you have remaining at home? (eg. number of tablets, injections, etc.) 0   If receiving an injectable medication, next injection date is 02/11/2024   Have you experienced any side effects in the last 30 days? No   Please enter the full address (street address, city, state, zip code) where you would like your medication(s) to be delivered to. [address] [place] [phone]   Please specify on which day you would like your medication(s) to arrive. Note: if you need your medication(s) within 3 days, please call the pharmacy to schedule your order at [phone]  02/06/2024   Has your insurance changed since your last refill? No   Would you like a pharmacist to call you to discuss your medication(s)? No   Do you require a signature for your package? (Note: if we are billing Medicare Part B or your order contains a controlled substance, we will require a signature) No   I have been provided my out of pocket cost for my medication and approve the pharmacy to charge the amount to my credit card on file. Yes         Completed refill call assessment today to schedule patient's medication shipment from the [facility] [phone]).  All relevant notes have been reviewed.       Confirmed patient received a Conservation Officer, Historic Buildings and a Surveyor, Mining with first shipment. The patient will receive a drug information handout for each medication shipped and additional FDA Medication Guides as required.         REFERRAL TO PHARMACIST     Referral to the pharmacist: Not needed      [facility]     Shipping address confirmed in Epic.     Delivery Scheduled: Yes, Expected medication delivery date: 02/07/24.     Medication will be delivered via UPS to the temporary address in Epic WAM.    Tara Christensen   [facility] Specialty and Home Delivery Pharmacy Specialty Technician

## 2024-01-26 NOTE — Telephone Encounter (Signed)
 Pt's mother called on behalf of patient stating that Tara Christensen's son was diagnosed with the flu a few days ago. The on call doctors had provided her with an Rx for Tamiflu  2 days ago. States pt's symptoms have worsened.     RN contacted patient directly regarding her symptoms. Patient reports taking Robutussin & Tylenol  but reports not feeling better. Pt states she has been having difficulty sleeping, coughing up bloody mucous, and reports SOB. Pt instructed to head to the ER for evaluation.     Pt requested for the RN to call her mom to inform her to bring her to the hospital. RN confirmed number & confirmed I can tell pt's mom I spoke to her & recommended her go to the hospital.     RN contacted pt's mother at the number provided and informed her I spoke to her daughter & recommended her going to the nearest ER. Mother verbalized she would bring Tara Christensen to the ER.

## 2024-02-06 MED FILL — CIMZIA 400 MG/2 ML (200 MG/ML X 2) SUBCUTANEOUS SYRINGE KIT: SUBCUTANEOUS | 28 days supply | Qty: 2 | Fill #2

## 2024-02-07 NOTE — Telephone Encounter (Signed)
 Pt called stating her youngest had the flu around 12/16 had spoke to on call providers & they prescribed tamiflu . A few days after she began the medication she began feeling unwell but never tested for the flu. Her older two sons now have the flu. she was wondering if she needed another prescription of tamiflu .     RN reached out to [facility] for advise & advised another Rx was not needed at this point, as it was too late. Pt aware and advised to treat s/s if they do come and to reach out to us  if she needs anything.

## 2024-02-10 DIAGNOSIS — O0991 Supervision of high risk pregnancy, unspecified, first trimester: Principal | ICD-10-CM

## 2024-02-10 DIAGNOSIS — O99210 Obesity complicating pregnancy, unspecified trimester: Principal | ICD-10-CM

## 2024-02-10 LAB — COMPREHENSIVE METABOLIC PANEL
ALBUMIN: 4.1 g/dL (ref 3.4–5.0)
ALKALINE PHOSPHATASE: 104 U/L (ref 38–126)
ALT (SGPT): 28 U/L (ref <35)
ANION GAP: 8 mmol/L (ref 5–14)
AST (SGOT): 30 U/L (ref 14–38)
BILIRUBIN TOTAL: 0.5 mg/dL (ref 0.1–1.2)
BLOOD UREA NITROGEN: 8 mg/dL (ref 7–21)
BUN / CREAT RATIO: 16
CALCIUM: 8.6 mg/dL (ref 8.5–10.2)
CHLORIDE: 110 mmol/L — ABNORMAL HIGH (ref 98–107)
CO2: 17 mmol/L — ABNORMAL LOW (ref 22.0–32.0)
CREATININE: 0.5 mg/dL — ABNORMAL LOW (ref 0.60–1.00)
EGFR CKD-EPI (2021) FEMALE: >90 mL/min/1.73m2 (ref >=60)
GLUCOSE RANDOM: 112 mg/dL — ABNORMAL HIGH (ref 74–106)
POTASSIUM: 3.2 mmol/L — ABNORMAL LOW (ref 3.5–5.0)
PROTEIN TOTAL: 7.4 g/dL (ref 6.5–8.3)
SODIUM: 135 mmol/L (ref 135–145)

## 2024-02-10 LAB — CBC
HEMATOCRIT: 37.3 % (ref 34.0–44.0)
HEMOGLOBIN: 12.6 g/dL (ref 11.3–14.9)
MEAN CORPUSCULAR HEMOGLOBIN CONC: 33.8 g/dL (ref 32.0–36.0)
MEAN CORPUSCULAR HEMOGLOBIN: 27.8 pg (ref 25.9–32.4)
MEAN CORPUSCULAR VOLUME: 82.2 fL (ref 77.6–95.7)
MEAN PLATELET VOLUME: 7.1 fL (ref 6.8–10.7)
PLATELET COUNT: 214 10*9/L (ref 150–450)
RED BLOOD CELL COUNT: 4.54 10*12/L (ref 3.95–5.13)
RED CELL DISTRIBUTION WIDTH: 14.6 % (ref 12.2–15.2)
WBC ADJUSTED: 13.8 10*9/L — ABNORMAL HIGH (ref 3.6–11.2)

## 2024-02-10 LAB — PROTEIN / CREATININE RATIO, URINE
CREATININE, URINE: 216.2 mg/dL
PROTEIN URINE: 9 mg/dL
PROTEIN/CREAT RATIO, URINE: 0.042

## 2024-02-10 LAB — URIC ACID: URIC ACID: 4.7 mg/dL (ref 3.0–6.5)

## 2024-02-10 NOTE — Telephone Encounter (Signed)
 Telephone Note    Returning call, patient contacted on-call provider line.    Tara Christensen is a 29 y.o. [phone] at [redacted]w[redacted]d with a history of chronic hypertension    Today, she states she is experiencing a severe headache that has not resolved with tylenol , supportive measures like rest, hot showers, and dark rooms, or headache cocktail. BP on home cuff was WNL.     At this time, recommend patient present to labor and delivery for evaluation. Given distance from [facility], patient will present to [facility] for evaluation and transfer to [facility] if indicated      Therisa Keven Bora, MD  Obstetrics and Gynecology, PGY-2

## 2024-02-10 NOTE — Progress Notes (Signed)
 2155 pt left unit, undelivered, all questions answered, discharge edu provided.

## 2024-02-11 ENCOUNTER — Inpatient Hospital Stay: Admit: 2024-02-11 | Discharge: 2024-02-11 | Payer: Medicaid (Managed Care)

## 2024-02-11 MED ADMIN — acetaminophen (TYLENOL) tablet 1,000 mg: 1000 mg | ORAL | @ 02:00:00
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
## 2024-02-23 ENCOUNTER — Inpatient Hospital Stay: Admit: 2024-02-23 | Discharge: 2024-02-24 | Payer: Medicaid (Managed Care)

## 2024-02-23 DIAGNOSIS — J45909 Unspecified asthma, uncomplicated: Principal | ICD-10-CM

## 2024-02-23 DIAGNOSIS — G4733 Obstructive sleep apnea (adult) (pediatric): Principal | ICD-10-CM

## 2024-02-23 DIAGNOSIS — O99282 Endocrine, nutritional and metabolic diseases complicating pregnancy, second trimester: Principal | ICD-10-CM

## 2024-02-23 DIAGNOSIS — F339 Major depressive disorder, recurrent, unspecified: Principal | ICD-10-CM

## 2024-02-23 DIAGNOSIS — O0992 Supervision of high risk pregnancy, unspecified, second trimester: Principal | ICD-10-CM

## 2024-02-23 DIAGNOSIS — E039 Hypothyroidism, unspecified: Principal | ICD-10-CM

## 2024-02-23 DIAGNOSIS — E669 Obesity, unspecified: Principal | ICD-10-CM

## 2024-02-23 DIAGNOSIS — L409 Psoriasis, unspecified: Principal | ICD-10-CM

## 2024-02-23 DIAGNOSIS — O35EXX Pyelectasis of fetus on prenatal ultrasound (HHS-HCC): Principal | ICD-10-CM

## 2024-02-23 DIAGNOSIS — I10 Essential (primary) hypertension: Principal | ICD-10-CM

## 2024-02-23 DIAGNOSIS — O0991 Supervision of high risk pregnancy, unspecified, first trimester: Principal | ICD-10-CM

## 2024-02-23 DIAGNOSIS — O34219 Maternal care for unspecified type scar from previous cesarean delivery: Principal | ICD-10-CM

## 2024-02-23 DIAGNOSIS — G932 Benign intracranial hypertension: Principal | ICD-10-CM

## 2024-02-23 DIAGNOSIS — N1330 Unspecified hydronephrosis: Principal | ICD-10-CM

## 2024-02-23 DIAGNOSIS — O99513 Diseases of the respiratory system complicating pregnancy, third trimester: Principal | ICD-10-CM

## 2024-02-23 MED ORDER — SERTRALINE 100 MG TABLET
ORAL_TABLET | ORAL | 2 refills | 0.00000 days | Status: CP
Start: 2024-02-23 — End: ?

## 2024-02-23 MED ORDER — BUPROPION HCL SR 100 MG TABLET,12 HR SUSTAINED-RELEASE
ORAL_TABLET | ORAL | 2 refills | 0.00000 days | Status: CP
Start: 2024-02-23 — End: ?

## 2024-02-23 NOTE — Progress Notes (Signed)
 Dear Jenkins Eleanor Gaskins, MD     Thank you very much for consulting me regarding Ms. Boen.       She is an 29 y.o. [phone] at [redacted]w[redacted]d by Estimated Date of Delivery: 06/07/24 who is seen in consultation regarding a new finding of bilateral urinary tract dilation.    Comprehensive anatomical ultrasound     A single intrauterine gestation is identified with biometry consistent with the established gestational age.      Detailed anatomical evaluation was performed.      Within the limitations of ultrasound, all visualized anatomy appears normal. Spine remains suboptimal due to fetal position; however, posterior fossa is normal which limits concern for any abnormalities.     Cell-free DNA screening for aneuploidy was low risk.     Right kidney: 5.11 mm  Left kidney: 6.56 mm     Fetal urinary tract dilatation     The ultrasound today demonstrated fetal bilateral urinary tract dilatation (UTD - previously termed pyelectasis or dilated renal pelvis).      Specific findings include:     Right kidney: renal pelvis 5.11 mm; grade: A1 (APD 4-7 mm; normal bladder, normal renal parenchyma no minor calyceal dilatation, normal AFV, normal ureter)  Left kidney: renal pelvis 6.56 mm; grade; grade A1 (APD 4-7 mm; normal bladder, normal renal parenchyma no minor calyceal dilatation, normal AFV, normal ureter)     The amniotic fluid volume is normal with AFI of 11.2 ; MVP 4.5.     The fetal bladder is visible     UTD with renal pelvic dilatation > 4 mm is associated with a 1.5-2 x increased risk of trisomy 21 from baseline maternal age or aneuploidy screening risk. Given her baseline trisomy 21 risk of 1: 10,000 based on cell free fetal DNA, her current trisomy 21 risk is approximately 1: 5000. The option of diagnostic testing or NIPS was offered and declined. Of note, patient has a trio genome report in her medical record; this is related to evaluation for her other children with autism which is ongoing but currently nondiagnostic, per the patient.     The majority of UTD in the midtrimester will resolve by the end of the pregnancy, however, a small minority may progress or persist and require post natal evaluation to exclude urinary tract obstruction or reflux.      Recommended follow up for this finding includes  A1: single prenatal ultrasound after 32 weeks and follow up based on the US  findings at that time  Thus a follow up US  was scheduled at today's visit. Late pregnancy and postnatal management recommendations will be made at that time.      Follow up imaging was not ordered. If the patient requires follow up imaging, this should be ordered by primary provider.      Patient had an appointment with Dr. Thaddeus prior to their ultrasound.    Orders Placed This Encounter   Procedures    US  OB Follow Up 2nd/3rd Tri Single Gestation       This encounter was based on time. I personally spent 15 minutes face-to-face and non-face-to-face in the care of this patient, which includes all pre, intra, and post visit time on the date of service. This time was separate from time spent in interpretation of ultrasound images.    Thank you for allowing me to participate in the care of your patient.     Sincerely,     Estefana FORBES Primrose, MD

## 2024-02-24 NOTE — Telephone Encounter (Signed)
 Telephone Note    Called patient (two patient identifiers used) and discussed her ultrasound findings in more detail. All questions answered and concerns addressed.    RONAL Milliner, MD  MFM
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
## 2024-03-06 NOTE — Progress Notes (Signed)
 [facility] Special[facility] and Home Delivery Pharmacy Refill Coordination Note    Tara Christensen, DOB: September 28, 1995  Phone: There are no phone numbers on file.      All above HIPAA information was verified with patient.         03/05/2024    11:16 AM   Specialty Rx Medication Refill Questionnaire   Which Medications would you like refilled and shipped? Cizima   Please list all current allergies: Ultram and adhesive tape   Have you missed any doses in the last 30 days? No   Have you had any changes to your medication(s) since your last refill? No   How much of each medication do you have remaining at home? (eg. number of tablets, injections, etc.) 0   If receiving an injectable medication, next injection date is 03/16/2024   Have you experienced any side effects in the last 30 days? No   Please enter the full address (street address, city, state, zip code) where you would like your medication(s) to be delivered to. [address], [place], [place] [address]   Please specify on which day you would like your medication(s) to arrive. Note: if you need your medication(s) within 3 days, please call the pharmacy to schedule your order at [phone]  03/13/2024   Has your insurance changed since your last refill? No   Would you like a pharmacist to call you to discuss your medication(s)? No   Do you require a signature for your package? (Note: if we are billing Medicare Part B or your order contains a controlled substance, we will require a signature) No   I have been provided my out of pocket cost for my medication and approve the pharmacy to charge the amount to my credit card on file. Yes         Completed refill call assessment today to schedule patient's medication shipment from the [facility] [phone]).  All relevant notes have been reviewed.       Confirmed patient received a Conservation Officer, Historic Buildings and a Surveyor, Mining with first shipment. The patient will receive a drug information handout for each medication shipped and additional FDA Medication Guides as required.         REFERRAL TO PHARMACIST     Referral to the pharmacist: Not needed      [facility]     Shipping address confirmed in Epic.     Delivery Scheduled: Yes, Expected medication delivery date: 03/13/2024.     Medication will be delivered via UPS to the temporary address in Epic WAM.    Lucie HERO Forts   [facility] Specialty and Home Delivery Pharmacy Specialty Technician

## 2024-03-10 NOTE — Telephone Encounter (Signed)
 Returned patient page. Confirmed name and DOB.     Tara Christensen is a 29 y.o. [phone] at [redacted]w[redacted]d with a history of chronic hypertension     Patient's mother reports she was at her local ED yesterday. Had been having contractions but cervix was closed. Reported they told her she was 50% effaced. She reports the monitoring was reassuring. She was there for about 2 hours. She was only checked once.     Reports she is having abdominal pain that is constant on her lower back. Her pain has been getting worse since yesterday. Feels like cramps to the middle of her stomach.     Reports she's leaking Stuff and is unsure what it is. Has been leaking for days.     Denies vaginal bleeding. Reports decreased fetal movement.     Based on this information, I recommended she be evaluated in triage to rule out preterm labor and pprom. Patient voiced understanding.     Leartis Durand, MD   Obstetrics and Gynecology PGY-2  [facility]

## 2024-03-11 NOTE — Assessment & Plan Note (Signed)
 On Hydralazine  50 mg q6 + labetalol  600 mg BID

## 2024-03-11 NOTE — Assessment & Plan Note (Signed)
-   Serial Growth US   - Plan for weekly antenatal testing at 32 weeks

## 2024-03-11 NOTE — Assessment & Plan Note (Signed)
 Orders placed for TFTs.

## 2024-03-11 NOTE — Progress Notes (Unsigned)
 [facility]                                                                         Return Prenatal Note    Assessment/Plan:   Plan   29 y.o. [phone] at [redacted]w[redacted]d presents for a follow-up OB visit for high risk pregnancy.     Chronic hypertension  On Hydralazine  50 mg q6 + labetalol  600 mg BID     Hypothyroidism affecting pregnancy in second trimester (HHS-HCC)  Orders placed for TFTs      BMI >60  - Serial Growth US   - Plan for weekly antenatal testing at 32 weeks    Supervision of high risk pregnancy in first trimester (HHS-HCC)  1hr GTT and third trimester labs at next visit.         Future Appointments   Date Time Provider Department Center   03/13/2024  8:40 AM Dominique Jenkins Setter, MD [facility]   03/19/2024  8:00 AM [facility] US  RM 6 [id number] [place] - MFM at   03/19/2024  9:00 AM Dude, Jenkins Setter, MD [facility]   04/03/2024  2:20 PM Dude, Jenkins Setter, MD [facility]   04/17/2024 10:40 AM Dude, Jenkins Setter, MD [facility]   04/17/2024  6:30 PM SLEEP 10 [id number]            Subjective:         29 y.o. [id number] at [redacted]w[redacted]d presents for follow-up OB visit for high risk pregnancy. Patient reports ***normal fetal movement and denies contractions, leakage of fluid, or vaginal bleeding.      Objective:          Flowsheet Vitals:     Total weight gain: 5.897 kg (13 lb)    General Appearance No acute distress, well appearing, and well nourished.   Pulmonary Normal work of breathing.   Cardiovascular ***Normal rate.  Edema {Numbers; edema:17696}.      Gastronintestinal Abdomen soft, gravid, no rebound or guarding.   Genitourinary {MFMSVE:107962::SVE deferred}     Muskuloskeletal Normal gait, grossly normal range of motion   Neurologic Alert and oriented to person, place, and time   Psychiatric  Integumentary Mood and affect within normal limits  Skin is warm, dry, no rash noted   This encounter was coded based on {time, MFM MDM:74003}.

## 2024-03-11 NOTE — Assessment & Plan Note (Signed)
 1hr GTT and third trimester labs at next visit.

## 2024-03-12 ENCOUNTER — Encounter
Admit: 2024-03-12 | Discharge: 2024-03-12 | Payer: Medicaid (Managed Care) | Attending: Maternal & Fetal Medicine | Primary: Maternal & Fetal Medicine

## 2024-03-12 DIAGNOSIS — O0991 Supervision of high risk pregnancy, unspecified, first trimester: Secondary | ICD-10-CM

## 2024-03-12 DIAGNOSIS — O368131 Decreased fetal movements, third trimester, fetus 1: Principal | ICD-10-CM

## 2024-03-12 DIAGNOSIS — I10 Essential (primary) hypertension: Secondary | ICD-10-CM

## 2024-03-12 DIAGNOSIS — O0973 Supervision of high risk pregnancy due to social problems, third trimester: Secondary | ICD-10-CM

## 2024-03-12 DIAGNOSIS — O0993 Supervision of high risk pregnancy, unspecified, third trimester: Secondary | ICD-10-CM

## 2024-03-12 DIAGNOSIS — O23593 Infection of other part of genital tract in pregnancy, third trimester: Principal | ICD-10-CM

## 2024-03-12 DIAGNOSIS — N898 Other specified noninflammatory disorders of vagina: Secondary | ICD-10-CM

## 2024-03-12 LAB — COMPREHENSIVE METABOLIC PANEL
ALBUMIN: 2.9 g/dL — ABNORMAL LOW (ref 3.4–5.0)
ALKALINE PHOSPHATASE: 104 U/L (ref 46–116)
ALT (SGPT): 13 U/L (ref 10–49)
ANION GAP: 13 mmol/L (ref 5–14)
AST (SGOT): 14 U/L (ref <=34)
BILIRUBIN TOTAL: 0.3 mg/dL (ref 0.3–1.2)
BLOOD UREA NITROGEN: 7 mg/dL — ABNORMAL LOW (ref 9–23)
BUN / CREAT RATIO: 17
CALCIUM: 9.6 mg/dL (ref 8.7–10.4)
CHLORIDE: 102 mmol/L (ref 98–107)
CO2: 25 mmol/L (ref 20.0–31.0)
CREATININE: 0.42 mg/dL — ABNORMAL LOW (ref 0.55–1.02)
EGFR CKD-EPI (2021) FEMALE: >90 mL/min/{1.73_m2} (ref >=60)
GLUCOSE RANDOM: 109 mg/dL (ref 70–179)
POTASSIUM: 4 mmol/L (ref 3.4–4.8)
PROTEIN TOTAL: 7 g/dL (ref 5.7–8.2)
SODIUM: 140 mmol/L (ref 135–145)

## 2024-03-12 LAB — GESTATIONAL GTT SCREEN (1HR): OB GLUCOSE SCREEN - 1 HOUR: 109 mg/dL (ref 54–<134)

## 2024-03-12 LAB — PROTEIN / CREATININE RATIO, URINE
CREATININE, URINE: 33.4 mg/dL
PROTEIN URINE: <6 mg/dL

## 2024-03-12 LAB — CBC
HEMATOCRIT: 37.5 % (ref 34.0–44.0)
HEMOGLOBIN: 12.6 g/dL (ref 11.3–14.9)
MEAN CORPUSCULAR HEMOGLOBIN CONC: 33.6 g/dL (ref 32.0–36.0)
MEAN CORPUSCULAR HEMOGLOBIN: 27.6 pg (ref 25.9–32.4)
MEAN CORPUSCULAR VOLUME: 82.2 fL (ref 77.6–95.7)
MEAN PLATELET VOLUME: 7.8 fL (ref 6.8–10.7)
PLATELET COUNT: 191 10*9/L (ref 150–450)
RED BLOOD CELL COUNT: 4.57 10*12/L (ref 3.95–5.13)
RED CELL DISTRIBUTION WIDTH: 14.1 % (ref 12.2–15.2)
WBC ADJUSTED: 11.8 10*9/L — ABNORMAL HIGH (ref 3.6–11.2)

## 2024-03-12 MED ADMIN — dextrose 50 gram/ 296 mL oral solution 50 g: 50 g | ORAL | @ 19:00:00 | Stop: 2024-03-12

## 2024-03-12 NOTE — Progress Notes (Addendum)
 [facility]                                                                         Return Prenatal Note    Assessment/Plan:   Plan   29 y.o. [phone] at [redacted]w[redacted]d presents for a follow-up OB visit for high risk pregnancy.     Assessment & Plan  Supervision of high risk pregnancy in third trimester (HHS-HCC)  Glucola, third tri labs, and tdap today  NST done for decreased fetal movement - reactive. BSS showed DVP 5.8 cm.  Chronic hypertension  Blood pressure is normal today off of medications - will check labs as she reports high Bps at home. Encouraged her to restart medication as prescribed  Vaginal discharge  Vaginitis swab done  Idiopathic intracranial hypertension  She notes that she has stopped her diamox , and is having recurrent headaches. She has no follow up with neurosurgery or neuro-ophthalmology scheduled and is not sure who she is supposed to see. Urgent referral placed to neurosurgery.      We had a long talk about management of her pregnancy going forward. Despite reassuring exam, NST, and ultrasound today, she continued to state that 'I just don't feel right'. She requests inpatient management for the remainder of her pregnancy as this is what has ultimately happened throughout her other pregnancies (and I agree that she is at high risk of decompensation/developing recurrent preeclampsia, and I expect we will ultimately deliver her preterm for this). I noted that, at present, she does not have an acute indication for admission or delivery. She has transportation challenges as she does not drive. We discussed [facility] (where she has stayed before) as an alternative to inpatient admission but she is wary of this in particular because she does need to travel frequently back to [facility] for court dates as she currently has a restraining order/custody hearings for her FOB. She would be able to be excused from these (apparently weekly) hearings if she is hospitalized. I discussed with her that I am happy to write a letter if she thinks that will help.    She is concerned as she states she went to a local ED near her home and the doctor told her there that they can't 'handle either her or her baby' - I agree that that is likely largely true, but they can always transfer her to [facility]. She also reports this doctor told her to stop taking all medication except medication for depression - which she has done.    We also discussed a short term admission for neurosurgery assessment as she no longer has follow up scheduled with them and does not know who to see now that Dr. Kassie has left. I noted that this is a possibility but we do not have beds for direct admission today - can either go through triage for urgent concerns or wait until later in the week for beds to open. Will communicate with the antepartum team.     Future Appointments   Date Time Provider Department Center   03/19/2024  8:00 AM [facility]  RM 6 [id number] at   03/19/2024  9:00 AM Tiffane Sheldon, Jenkins Setter, MD [facility]   04/03/2024  2:20 PM Persephone Schriever, Jenkins Setter, MD [facility]   04/17/2024 10:40 AM Laini Urick, Jenkins Setter, MD [facility]   04/17/2024  6:30 PM SLEEP 10 [id number]            Subjective:         29 y.o. [id number] at [redacted]w[redacted]d presents for follow-up OB visit for high risk pregnancy. Patient reports decreased fetal movement and denies contractions or vaginal bleeding. She does note leaking discharge and mild abdominal cramping. She also notes headaches and says she has stopped taking all medications except for her antidepressants. She also notes elevated blood pressures at home.       Objective:          Flowsheet Vitals:  Temp: 36.9 ??C (98.4 ??F)  Pulse: 87  BP: 129/88  Movement: (S) (!) Decreased  Loss of Fluid: (S) Yes (clear-yellow fluid leaking since Thursday changing 2-3 times a day (not saturating))  Bleeding: No  Contractions: Regular (per pt)  Fetal Heart Rate: NST  Total weight gain: 7.62 kg (16 lb 12.8 oz)    General Appearance No acute distress, well appearing, and well nourished.   Pulmonary Normal work of breathing.   Cardiovascular Normal rate.  Edema 1+.      Gastronintestinal Abdomen soft, gravid, no rebound or guarding. Surgical scar well healing   Genitourinary SVE performed: Prior to the outpatient exam, consent was obtained from the patient prior to the sensitive portion of the exam.  A chaperone was present for any sensitive portions of the exam including but not limited to, breast and pelvic examination.   Harlene Sanders, RN. SSE was neg for ferning/pooling/nitrazine. SVE closed/long/high   Muskuloskeletal Normal gait, grossly normal range of motion   Neurologic Alert and oriented to person, place, and time   Psychiatric  Integumentary Mood and affect within normal limits  Skin is warm, dry, no rash noted   This encounter was coded based on time. I personally spent 45 minutes face-to-face and non-face-to-face in the care of this patient, which includes all pre, intra, and post visit time on the date of service.     Zelda Gaskins, MD

## 2024-03-12 NOTE — Progress Notes (Signed)
 Affie Gasner Blackie, a [phone] at [redacted]w[redacted]d with an EDD of 06/07/2024, by Ultrasound, was seen at [facility] for a nonstress test.    Reason for NST : Other (Comment) (decreased fetal movement)  Baseline: 150 BPM  Accelerations: Yes /   / Contractions: Not present  Nonstress Test Interpretation: Reactive                Follow-up testing is scheduled for per provider.

## 2024-03-13 LAB — SYPHILIS SCREEN: SYPHILIS RPR SCREEN: NONREACTIVE

## 2024-03-13 MED FILL — CIMZIA 400 MG/2 ML (200 MG/ML X 2) SUBCUTANEOUS SYRINGE KIT: SUBCUTANEOUS | 28 days supply | Qty: 2 | Fill #3

## 2024-03-14 NOTE — Assessment & Plan Note (Addendum)
 She notes that she has stopped her diamox , and is having recurrent headaches. She has no follow up with neurosurgery or neuro-ophthalmology scheduled and is not sure who she is supposed to see. Urgent referral placed to neurosurgery.

## 2024-03-14 NOTE — Assessment & Plan Note (Signed)
 Blood pressure is normal today off of medications - will check labs as she reports high Bps at home. Encouraged her to restart medication as prescribed
